# Patient Record
Sex: Female | Born: 1945 | Race: White | Hispanic: No | Marital: Married | Smoking: Never smoker
Health system: Southern US, Community
[De-identification: ages and names within clinical notes are randomized; demographics above are authoritative.]

## PROBLEM LIST (undated history)

## (undated) DIAGNOSIS — Z9289 Personal history of other medical treatment: Secondary | ICD-10-CM

## (undated) DIAGNOSIS — N19 Unspecified kidney failure: Secondary | ICD-10-CM

## (undated) DIAGNOSIS — Z87442 Personal history of urinary calculi: Secondary | ICD-10-CM

## (undated) DIAGNOSIS — I10 Essential (primary) hypertension: Secondary | ICD-10-CM

## (undated) DIAGNOSIS — F419 Anxiety disorder, unspecified: Secondary | ICD-10-CM

## (undated) DIAGNOSIS — R32 Unspecified urinary incontinence: Secondary | ICD-10-CM

## (undated) DIAGNOSIS — N289 Disorder of kidney and ureter, unspecified: Secondary | ICD-10-CM

## (undated) DIAGNOSIS — E039 Hypothyroidism, unspecified: Secondary | ICD-10-CM

## (undated) DIAGNOSIS — F32A Depression, unspecified: Secondary | ICD-10-CM

## (undated) DIAGNOSIS — G459 Transient cerebral ischemic attack, unspecified: Secondary | ICD-10-CM

## (undated) DIAGNOSIS — R413 Other amnesia: Secondary | ICD-10-CM

## (undated) DIAGNOSIS — J189 Pneumonia, unspecified organism: Secondary | ICD-10-CM

## (undated) DIAGNOSIS — M1990 Unspecified osteoarthritis, unspecified site: Secondary | ICD-10-CM

## (undated) DIAGNOSIS — E079 Disorder of thyroid, unspecified: Secondary | ICD-10-CM

## (undated) DIAGNOSIS — I639 Cerebral infarction, unspecified: Secondary | ICD-10-CM

## (undated) DIAGNOSIS — F329 Major depressive disorder, single episode, unspecified: Secondary | ICD-10-CM

## (undated) DIAGNOSIS — K219 Gastro-esophageal reflux disease without esophagitis: Secondary | ICD-10-CM

## (undated) DIAGNOSIS — J45909 Unspecified asthma, uncomplicated: Secondary | ICD-10-CM

## (undated) HISTORY — PX: CHOLECYSTECTOMY: SHX55

## (undated) HISTORY — PX: DILATION AND CURETTAGE OF UTERUS: SHX78

## (undated) HISTORY — PX: TONSILLECTOMY: SUR1361

## (undated) HISTORY — PX: KNEE SURGERY: SHX244

## (undated) HISTORY — PX: EYE SURGERY: SHX253

---

## 1998-04-24 ENCOUNTER — Other Ambulatory Visit: Admission: RE | Admit: 1998-04-24 | Discharge: 1998-04-24 | Payer: Self-pay | Admitting: Internal Medicine

## 1999-07-21 ENCOUNTER — Other Ambulatory Visit: Admission: RE | Admit: 1999-07-21 | Discharge: 1999-07-21 | Payer: Self-pay | Admitting: Internal Medicine

## 1999-09-03 ENCOUNTER — Encounter: Admission: RE | Admit: 1999-09-03 | Discharge: 1999-09-03 | Payer: Self-pay | Admitting: Internal Medicine

## 1999-10-28 ENCOUNTER — Emergency Department (HOSPITAL_COMMUNITY): Admission: EM | Admit: 1999-10-28 | Discharge: 1999-10-28 | Payer: Self-pay | Admitting: *Deleted

## 2000-08-22 ENCOUNTER — Other Ambulatory Visit: Admission: RE | Admit: 2000-08-22 | Discharge: 2000-08-22 | Payer: Self-pay | Admitting: Internal Medicine

## 2001-05-08 ENCOUNTER — Encounter: Admission: RE | Admit: 2001-05-08 | Discharge: 2001-05-08 | Payer: Self-pay | Admitting: Internal Medicine

## 2001-05-08 ENCOUNTER — Encounter: Payer: Self-pay | Admitting: Internal Medicine

## 2001-08-03 ENCOUNTER — Encounter: Payer: Self-pay | Admitting: Internal Medicine

## 2001-08-03 ENCOUNTER — Encounter: Admission: RE | Admit: 2001-08-03 | Discharge: 2001-08-03 | Payer: Self-pay | Admitting: Internal Medicine

## 2003-08-15 ENCOUNTER — Encounter: Admission: RE | Admit: 2003-08-15 | Discharge: 2003-08-15 | Payer: Self-pay | Admitting: Internal Medicine

## 2004-11-10 ENCOUNTER — Ambulatory Visit: Payer: Self-pay | Admitting: Internal Medicine

## 2005-06-24 ENCOUNTER — Encounter: Admission: RE | Admit: 2005-06-24 | Discharge: 2005-06-24 | Payer: Self-pay | Admitting: Internal Medicine

## 2005-11-25 ENCOUNTER — Ambulatory Visit: Payer: Self-pay | Admitting: Internal Medicine

## 2005-12-23 ENCOUNTER — Ambulatory Visit: Payer: Self-pay | Admitting: Gastroenterology

## 2006-12-29 ENCOUNTER — Encounter: Admission: RE | Admit: 2006-12-29 | Discharge: 2006-12-29 | Payer: Self-pay | Admitting: Internal Medicine

## 2007-12-21 ENCOUNTER — Encounter: Admission: RE | Admit: 2007-12-21 | Discharge: 2007-12-21 | Payer: Self-pay | Admitting: Internal Medicine

## 2008-05-09 ENCOUNTER — Encounter: Admission: RE | Admit: 2008-05-09 | Discharge: 2008-05-09 | Payer: Self-pay | Admitting: Internal Medicine

## 2008-08-15 ENCOUNTER — Encounter: Admission: RE | Admit: 2008-08-15 | Discharge: 2008-08-15 | Payer: Self-pay | Admitting: Orthopedic Surgery

## 2008-08-29 ENCOUNTER — Encounter: Admission: RE | Admit: 2008-08-29 | Discharge: 2008-08-29 | Payer: Self-pay | Admitting: Orthopedic Surgery

## 2008-10-15 ENCOUNTER — Encounter: Admission: RE | Admit: 2008-10-15 | Discharge: 2008-10-15 | Payer: Self-pay | Admitting: Orthopedic Surgery

## 2009-07-03 ENCOUNTER — Encounter: Admission: RE | Admit: 2009-07-03 | Discharge: 2009-07-03 | Payer: Self-pay | Admitting: Internal Medicine

## 2009-07-10 ENCOUNTER — Encounter: Admission: RE | Admit: 2009-07-10 | Discharge: 2009-07-10 | Payer: Self-pay | Admitting: Orthopedic Surgery

## 2010-06-04 ENCOUNTER — Encounter: Admission: RE | Admit: 2010-06-04 | Discharge: 2010-06-04 | Payer: Self-pay | Admitting: Orthopedic Surgery

## 2011-01-13 ENCOUNTER — Other Ambulatory Visit: Payer: Self-pay | Admitting: Internal Medicine

## 2011-01-13 DIAGNOSIS — Z1231 Encounter for screening mammogram for malignant neoplasm of breast: Secondary | ICD-10-CM

## 2011-01-26 ENCOUNTER — Ambulatory Visit
Admission: RE | Admit: 2011-01-26 | Discharge: 2011-01-26 | Disposition: A | Discharge status: Home / Self Care | Payer: Medicare Other | Source: Ambulatory Visit | Attending: Internal Medicine | Admitting: Internal Medicine

## 2011-01-26 DIAGNOSIS — Z1231 Encounter for screening mammogram for malignant neoplasm of breast: Secondary | ICD-10-CM

## 2011-04-18 ENCOUNTER — Other Ambulatory Visit: Payer: Self-pay | Admitting: Internal Medicine

## 2011-04-19 ENCOUNTER — Ambulatory Visit
Admission: RE | Admit: 2011-04-19 | Discharge: 2011-04-19 | Disposition: A | Discharge status: Home / Self Care | Payer: BLUE CROSS/BLUE SHIELD | Source: Ambulatory Visit | Attending: Internal Medicine | Admitting: Internal Medicine

## 2011-10-25 DIAGNOSIS — N184 Chronic kidney disease, stage 4 (severe): Secondary | ICD-10-CM | POA: Diagnosis not present

## 2011-10-25 DIAGNOSIS — M109 Gout, unspecified: Secondary | ICD-10-CM | POA: Diagnosis not present

## 2011-10-25 DIAGNOSIS — D649 Anemia, unspecified: Secondary | ICD-10-CM | POA: Diagnosis not present

## 2011-10-25 DIAGNOSIS — N2581 Secondary hyperparathyroidism of renal origin: Secondary | ICD-10-CM | POA: Diagnosis not present

## 2011-11-01 DIAGNOSIS — M109 Gout, unspecified: Secondary | ICD-10-CM | POA: Diagnosis not present

## 2011-11-01 DIAGNOSIS — N184 Chronic kidney disease, stage 4 (severe): Secondary | ICD-10-CM | POA: Diagnosis not present

## 2011-11-01 DIAGNOSIS — N2581 Secondary hyperparathyroidism of renal origin: Secondary | ICD-10-CM | POA: Diagnosis not present

## 2011-11-01 DIAGNOSIS — I129 Hypertensive chronic kidney disease with stage 1 through stage 4 chronic kidney disease, or unspecified chronic kidney disease: Secondary | ICD-10-CM | POA: Diagnosis not present

## 2011-11-23 DIAGNOSIS — M9420 Chondromalacia, unspecified site: Secondary | ICD-10-CM | POA: Diagnosis not present

## 2011-11-23 DIAGNOSIS — M5137 Other intervertebral disc degeneration, lumbosacral region: Secondary | ICD-10-CM | POA: Diagnosis not present

## 2011-12-01 DIAGNOSIS — H35379 Puckering of macula, unspecified eye: Secondary | ICD-10-CM | POA: Diagnosis not present

## 2011-12-01 DIAGNOSIS — H26499 Other secondary cataract, unspecified eye: Secondary | ICD-10-CM | POA: Diagnosis not present

## 2011-12-01 DIAGNOSIS — Z961 Presence of intraocular lens: Secondary | ICD-10-CM | POA: Diagnosis not present

## 2011-12-08 DIAGNOSIS — M1710 Unilateral primary osteoarthritis, unspecified knee: Secondary | ICD-10-CM | POA: Diagnosis not present

## 2011-12-15 DIAGNOSIS — M1710 Unilateral primary osteoarthritis, unspecified knee: Secondary | ICD-10-CM | POA: Diagnosis not present

## 2011-12-21 DIAGNOSIS — E039 Hypothyroidism, unspecified: Secondary | ICD-10-CM | POA: Diagnosis not present

## 2011-12-21 DIAGNOSIS — I10 Essential (primary) hypertension: Secondary | ICD-10-CM | POA: Diagnosis not present

## 2011-12-21 DIAGNOSIS — R82998 Other abnormal findings in urine: Secondary | ICD-10-CM | POA: Diagnosis not present

## 2011-12-22 DIAGNOSIS — M1710 Unilateral primary osteoarthritis, unspecified knee: Secondary | ICD-10-CM | POA: Diagnosis not present

## 2011-12-22 DIAGNOSIS — H35379 Puckering of macula, unspecified eye: Secondary | ICD-10-CM | POA: Diagnosis not present

## 2011-12-28 DIAGNOSIS — N184 Chronic kidney disease, stage 4 (severe): Secondary | ICD-10-CM | POA: Diagnosis not present

## 2011-12-28 DIAGNOSIS — E039 Hypothyroidism, unspecified: Secondary | ICD-10-CM | POA: Diagnosis not present

## 2011-12-28 DIAGNOSIS — I10 Essential (primary) hypertension: Secondary | ICD-10-CM | POA: Diagnosis not present

## 2011-12-28 DIAGNOSIS — Z0000 Encounter for general adult medical examination without abnormal findings: Secondary | ICD-10-CM | POA: Diagnosis not present

## 2012-01-17 DIAGNOSIS — N184 Chronic kidney disease, stage 4 (severe): Secondary | ICD-10-CM | POA: Diagnosis not present

## 2012-01-18 DIAGNOSIS — H26499 Other secondary cataract, unspecified eye: Secondary | ICD-10-CM | POA: Diagnosis not present

## 2012-01-19 DIAGNOSIS — M1710 Unilateral primary osteoarthritis, unspecified knee: Secondary | ICD-10-CM | POA: Diagnosis not present

## 2012-01-26 DIAGNOSIS — M1710 Unilateral primary osteoarthritis, unspecified knee: Secondary | ICD-10-CM | POA: Diagnosis not present

## 2012-02-23 DIAGNOSIS — E039 Hypothyroidism, unspecified: Secondary | ICD-10-CM | POA: Diagnosis not present

## 2012-02-23 DIAGNOSIS — I129 Hypertensive chronic kidney disease with stage 1 through stage 4 chronic kidney disease, or unspecified chronic kidney disease: Secondary | ICD-10-CM | POA: Diagnosis not present

## 2012-02-23 DIAGNOSIS — I10 Essential (primary) hypertension: Secondary | ICD-10-CM | POA: Diagnosis not present

## 2012-02-23 DIAGNOSIS — N2581 Secondary hyperparathyroidism of renal origin: Secondary | ICD-10-CM | POA: Diagnosis not present

## 2012-02-23 DIAGNOSIS — N184 Chronic kidney disease, stage 4 (severe): Secondary | ICD-10-CM | POA: Diagnosis not present

## 2012-03-05 ENCOUNTER — Other Ambulatory Visit: Payer: Self-pay | Admitting: Internal Medicine

## 2012-03-05 DIAGNOSIS — Z1231 Encounter for screening mammogram for malignant neoplasm of breast: Secondary | ICD-10-CM

## 2012-03-09 DIAGNOSIS — I10 Essential (primary) hypertension: Secondary | ICD-10-CM | POA: Diagnosis not present

## 2012-03-09 DIAGNOSIS — R87610 Atypical squamous cells of undetermined significance on cytologic smear of cervix (ASC-US): Secondary | ICD-10-CM | POA: Diagnosis not present

## 2012-03-09 DIAGNOSIS — E039 Hypothyroidism, unspecified: Secondary | ICD-10-CM | POA: Diagnosis not present

## 2012-03-09 DIAGNOSIS — Z0000 Encounter for general adult medical examination without abnormal findings: Secondary | ICD-10-CM | POA: Diagnosis not present

## 2012-03-09 DIAGNOSIS — N184 Chronic kidney disease, stage 4 (severe): Secondary | ICD-10-CM | POA: Diagnosis not present

## 2012-03-21 ENCOUNTER — Ambulatory Visit
Admission: RE | Admit: 2012-03-21 | Discharge: 2012-03-21 | Disposition: A | Discharge status: Home / Self Care | Payer: Medicare Other | Source: Ambulatory Visit | Attending: Internal Medicine | Admitting: Internal Medicine

## 2012-03-21 DIAGNOSIS — Z1231 Encounter for screening mammogram for malignant neoplasm of breast: Secondary | ICD-10-CM

## 2012-03-22 DIAGNOSIS — H43819 Vitreous degeneration, unspecified eye: Secondary | ICD-10-CM | POA: Diagnosis not present

## 2012-03-22 DIAGNOSIS — H35379 Puckering of macula, unspecified eye: Secondary | ICD-10-CM | POA: Diagnosis not present

## 2012-03-22 DIAGNOSIS — H35359 Cystoid macular degeneration, unspecified eye: Secondary | ICD-10-CM | POA: Diagnosis not present

## 2012-04-02 DIAGNOSIS — M25569 Pain in unspecified knee: Secondary | ICD-10-CM | POA: Diagnosis not present

## 2012-04-02 DIAGNOSIS — M1710 Unilateral primary osteoarthritis, unspecified knee: Secondary | ICD-10-CM | POA: Diagnosis not present

## 2012-04-24 DIAGNOSIS — I491 Atrial premature depolarization: Secondary | ICD-10-CM | POA: Diagnosis not present

## 2012-04-24 DIAGNOSIS — I369 Nonrheumatic tricuspid valve disorder, unspecified: Secondary | ICD-10-CM | POA: Diagnosis not present

## 2012-04-24 DIAGNOSIS — Z01818 Encounter for other preprocedural examination: Secondary | ICD-10-CM | POA: Diagnosis not present

## 2012-04-26 DIAGNOSIS — K921 Melena: Secondary | ICD-10-CM | POA: Diagnosis not present

## 2012-04-26 DIAGNOSIS — D126 Benign neoplasm of colon, unspecified: Secondary | ICD-10-CM | POA: Diagnosis not present

## 2012-05-14 DIAGNOSIS — M9420 Chondromalacia, unspecified site: Secondary | ICD-10-CM | POA: Diagnosis not present

## 2012-05-14 DIAGNOSIS — M1710 Unilateral primary osteoarthritis, unspecified knee: Secondary | ICD-10-CM | POA: Diagnosis not present

## 2012-05-21 DIAGNOSIS — M1710 Unilateral primary osteoarthritis, unspecified knee: Secondary | ICD-10-CM | POA: Diagnosis not present

## 2012-05-21 DIAGNOSIS — M238X9 Other internal derangements of unspecified knee: Secondary | ICD-10-CM | POA: Diagnosis not present

## 2012-05-25 DIAGNOSIS — M1710 Unilateral primary osteoarthritis, unspecified knee: Secondary | ICD-10-CM | POA: Diagnosis not present

## 2012-05-29 DIAGNOSIS — M1710 Unilateral primary osteoarthritis, unspecified knee: Secondary | ICD-10-CM | POA: Diagnosis not present

## 2012-06-01 DIAGNOSIS — M2240 Chondromalacia patellae, unspecified knee: Secondary | ICD-10-CM | POA: Diagnosis not present

## 2012-06-01 DIAGNOSIS — X58XXXA Exposure to other specified factors, initial encounter: Secondary | ICD-10-CM | POA: Diagnosis not present

## 2012-06-01 DIAGNOSIS — Y929 Unspecified place or not applicable: Secondary | ICD-10-CM | POA: Diagnosis not present

## 2012-06-01 DIAGNOSIS — M9380 Other specified osteochondropathies of unspecified site: Secondary | ICD-10-CM | POA: Diagnosis not present

## 2012-06-01 DIAGNOSIS — S83289A Other tear of lateral meniscus, current injury, unspecified knee, initial encounter: Secondary | ICD-10-CM | POA: Diagnosis not present

## 2012-06-01 DIAGNOSIS — M1710 Unilateral primary osteoarthritis, unspecified knee: Secondary | ICD-10-CM | POA: Diagnosis not present

## 2012-06-13 DIAGNOSIS — E039 Hypothyroidism, unspecified: Secondary | ICD-10-CM | POA: Diagnosis not present

## 2012-06-13 DIAGNOSIS — N184 Chronic kidney disease, stage 4 (severe): Secondary | ICD-10-CM | POA: Diagnosis not present

## 2012-06-13 DIAGNOSIS — I10 Essential (primary) hypertension: Secondary | ICD-10-CM | POA: Diagnosis not present

## 2012-06-21 DIAGNOSIS — I129 Hypertensive chronic kidney disease with stage 1 through stage 4 chronic kidney disease, or unspecified chronic kidney disease: Secondary | ICD-10-CM | POA: Diagnosis not present

## 2012-06-21 DIAGNOSIS — N184 Chronic kidney disease, stage 4 (severe): Secondary | ICD-10-CM | POA: Diagnosis not present

## 2012-06-21 DIAGNOSIS — H35379 Puckering of macula, unspecified eye: Secondary | ICD-10-CM | POA: Diagnosis not present

## 2012-06-21 DIAGNOSIS — M109 Gout, unspecified: Secondary | ICD-10-CM | POA: Diagnosis not present

## 2012-06-21 DIAGNOSIS — D649 Anemia, unspecified: Secondary | ICD-10-CM | POA: Diagnosis not present

## 2012-06-21 DIAGNOSIS — H33109 Unspecified retinoschisis, unspecified eye: Secondary | ICD-10-CM | POA: Diagnosis not present

## 2012-06-21 DIAGNOSIS — H43819 Vitreous degeneration, unspecified eye: Secondary | ICD-10-CM | POA: Diagnosis not present

## 2012-06-26 DIAGNOSIS — M1710 Unilateral primary osteoarthritis, unspecified knee: Secondary | ICD-10-CM | POA: Diagnosis not present

## 2012-07-04 DIAGNOSIS — M1710 Unilateral primary osteoarthritis, unspecified knee: Secondary | ICD-10-CM | POA: Diagnosis not present

## 2012-07-06 DIAGNOSIS — E039 Hypothyroidism, unspecified: Secondary | ICD-10-CM | POA: Diagnosis not present

## 2012-07-06 DIAGNOSIS — F341 Dysthymic disorder: Secondary | ICD-10-CM | POA: Diagnosis not present

## 2012-07-06 DIAGNOSIS — I10 Essential (primary) hypertension: Secondary | ICD-10-CM | POA: Diagnosis not present

## 2012-07-06 DIAGNOSIS — Z23 Encounter for immunization: Secondary | ICD-10-CM | POA: Diagnosis not present

## 2012-07-11 DIAGNOSIS — M1710 Unilateral primary osteoarthritis, unspecified knee: Secondary | ICD-10-CM | POA: Diagnosis not present

## 2012-07-25 DIAGNOSIS — M1710 Unilateral primary osteoarthritis, unspecified knee: Secondary | ICD-10-CM | POA: Diagnosis not present

## 2012-07-25 DIAGNOSIS — M533 Sacrococcygeal disorders, not elsewhere classified: Secondary | ICD-10-CM | POA: Diagnosis not present

## 2012-08-01 DIAGNOSIS — M533 Sacrococcygeal disorders, not elsewhere classified: Secondary | ICD-10-CM | POA: Diagnosis not present

## 2012-08-01 DIAGNOSIS — M1710 Unilateral primary osteoarthritis, unspecified knee: Secondary | ICD-10-CM | POA: Diagnosis not present

## 2012-08-09 DIAGNOSIS — M1710 Unilateral primary osteoarthritis, unspecified knee: Secondary | ICD-10-CM | POA: Diagnosis not present

## 2012-08-20 DIAGNOSIS — M1710 Unilateral primary osteoarthritis, unspecified knee: Secondary | ICD-10-CM | POA: Diagnosis not present

## 2012-08-27 DIAGNOSIS — M1710 Unilateral primary osteoarthritis, unspecified knee: Secondary | ICD-10-CM | POA: Diagnosis not present

## 2012-10-25 DIAGNOSIS — H35379 Puckering of macula, unspecified eye: Secondary | ICD-10-CM | POA: Diagnosis not present

## 2012-10-25 DIAGNOSIS — H33109 Unspecified retinoschisis, unspecified eye: Secondary | ICD-10-CM | POA: Diagnosis not present

## 2012-11-21 ENCOUNTER — Other Ambulatory Visit (HOSPITAL_COMMUNITY): Payer: Self-pay | Admitting: Internal Medicine

## 2012-11-21 DIAGNOSIS — E039 Hypothyroidism, unspecified: Secondary | ICD-10-CM | POA: Diagnosis not present

## 2012-11-21 DIAGNOSIS — R7402 Elevation of levels of lactic acid dehydrogenase (LDH): Secondary | ICD-10-CM | POA: Diagnosis not present

## 2012-11-21 DIAGNOSIS — R1310 Dysphagia, unspecified: Secondary | ICD-10-CM

## 2012-11-21 DIAGNOSIS — G4700 Insomnia, unspecified: Secondary | ICD-10-CM | POA: Diagnosis not present

## 2012-11-21 DIAGNOSIS — N184 Chronic kidney disease, stage 4 (severe): Secondary | ICD-10-CM | POA: Diagnosis not present

## 2012-11-21 DIAGNOSIS — I10 Essential (primary) hypertension: Secondary | ICD-10-CM | POA: Diagnosis not present

## 2012-11-21 DIAGNOSIS — F341 Dysthymic disorder: Secondary | ICD-10-CM | POA: Diagnosis not present

## 2012-11-21 DIAGNOSIS — D649 Anemia, unspecified: Secondary | ICD-10-CM | POA: Diagnosis not present

## 2012-11-27 ENCOUNTER — Ambulatory Visit (HOSPITAL_COMMUNITY)
Admission: RE | Admit: 2012-11-27 | Discharge: 2012-11-27 | Disposition: A | Discharge status: Home / Self Care | Payer: Medicare Other | Source: Ambulatory Visit | Attending: Internal Medicine | Admitting: Internal Medicine

## 2012-11-27 DIAGNOSIS — R1319 Other dysphagia: Secondary | ICD-10-CM | POA: Insufficient documentation

## 2012-11-27 DIAGNOSIS — R1310 Dysphagia, unspecified: Secondary | ICD-10-CM | POA: Diagnosis not present

## 2012-11-27 NOTE — Procedures (Signed)
Objective Swallowing Evaluation: Modified Barium Swallowing Study  Patient Details  Name: Carolyn Robertson MRN: [id number] Date of Birth: [birth year]  Today's Date: 11/27/2012 Time:1301  - 1400    Past Medical History: No past medical history on file. Past Surgical History: No past surgical history on file. HPI:  67 yo female referred by Dr Reynaldo Minium for MBS due to concerns pt may be aspirating per his referral form.  PMH + for HTN, CKD, ACD, OA, migraines, allergies, asthma.  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  Pt had an UGI study in 2002 and has been taking Nexium since per her report.  She complains of persistent sore throat - pills sticking in the throat and problems swallowing meats or dense items.  She denies pulmonary infection but does admit to weight loss over the last year, which she attributes to stress.  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   Previous Swallow Assessment: SMALL HIATUS HERNIA WITH SLIGHT DYSMOTILITY PROBABLY DUE TO REFLUX ESOPHAGITIS WITH MARKED SPONTANEOUS GASTROESOPHAGEAL REFLUX 04/2001    Assessment / Plan / Recommendation Clinical Impression  Dysphagia Diagnosis: Within Functional Limits;Suspected primary esophageal dysphagia Clinical impression: Pt presents with a functional oropharyngeal swallow and suspected primary esophageal/GERD deficits.  No aspiration or deep laryngeal penetration observed.  Swallow was timely with only mild to trace vallecular residuals noted with liquids.    Radiologist was not present for testing during esophageal scan.  Pt appeared with tertiary contractions and retrograde propulsion of liquids to thoracic esophagus WITHOUT sensation.  Pudding and cracker appeared be delayed clearing distally with pt referrant sensation to pharynx - likely due to vagus nerve.  Barium tablet taken with thin readily cleared into stomach without  delay.  Pt's symptoms appear consistent with LPR- score on Reflux Symptom Index was 17/45.  Given pt has previously been diagnosed with reflux, slp provided pt with verbal and written tips to mitigate her dysphagia symptoms.  SLP used teach back to assure pt comprehended info provided.      Treatment Recommendation       Diet Recommendation Regular;Thin liquid (extra gravy and sauce)   Liquid Administration via: Cup;Straw Medication Administration: Whole meds with liquid Supervision: Patient able to self feed Compensations: Slow rate;Small sips/bites;Follow solids with liquid Postural Changes and/or Swallow Maneuvers: Out of bed for meals    Other  Recommendations Recommended Consults: Consider ENT evaluation;Consider esophageal assessment (rec ENT for sore throat given h/o radiation & thyriod issues) Oral Care Recommendations: Oral care BID   Follow Up Recommendations  None           SLP Swallow Goals     General Date of Onset: 11/27/12 HPI: 67 yo female referred by Dr Reynaldo Minium for MBS due to concerns pt may be aspirating per his referral form.  PMH + for HTN, CKD, ACD, OA, migraines, allergies, asthma.  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  Pt had an UGI study in 2002 and has been taking Nexium since per her report.  She complains of persistent sore throat - pills sticking in the throat and problems swallowing meats or dense items.  She denies pulmonary infection but does admit to weight loss over the last year, which she attributes to stress.  Pt reports having undergone radiation at age 51 for ketones and subsequently having thyroid problems.   Type of Study: Modified Barium Swallowing  Study Previous Swallow Assessment: SMALL HIATUS HERNIA WITH SLIGHT DYSMOTILITY PROBABLY DUE TO REFLUX ESOPHAGITIS WITH MARKED SPONTANEOUS GASTROESOPHAGEAL REFLUX 04/2001 Diet Prior to this Study: Regular;Thin liquids Temperature Spikes Noted:  No Respiratory Status: Room air History of Recent Intubation: No Behavior/Cognition: Alert;Cooperative;Pleasant mood Oral Cavity - Dentition: Adequate natural dentition Oral Motor / Sensory Function: Within functional limits Self-Feeding Abilities: Able to feed self Patient Positioning: Upright in chair Baseline Vocal Quality: Clear Volitional Cough: Strong Volitional Swallow: Able to elicit Anatomy: Within functional limits    Reason for Referral   concern for aspiration  Oral Phase Oral Preparation/Oral Phase Oral Phase: WFL   Pharyngeal Phase Pharyngeal Phase Pharyngeal Phase: Within functional limits Pharyngeal - Thin Pharyngeal - Thin Cup: Penetration/Aspiration during swallow Penetration/Aspiration details (thin cup): Material enters airway, remains ABOVE vocal cords then ejected out [facility]) Pharyngeal - Thin Straw: Within functional limits Pharyngeal - Solids Pharyngeal - Puree: Within functional limits;Pharyngeal residue - valleculae Pharyngeal - Regular: Within functional limits;Pharyngeal residue - valleculae Pharyngeal - Pill: Within functional limits Pharyngeal Phase - Comment Pharyngeal Comment: trace stasis noted with liquids - pt sensed stasis at pharynx when it appeared in distal esophagus  Cervical Esophageal Phase    GO    Cervical Esophageal Phase Cervical Esophageal Phase: WFL (appearance of prominent cricopharyngeus intermittently seen)    Functional Assessment Tool Used: mbs, clinical judgement Functional Limitations: Swallowing Swallow Current Status [id number]): At least 1 percent but less than 20 percent impaired, limited or restricted Swallow Goal Status [phone]): At least 1 percent but less than 20 percent impaired, limited or restricted Swallow Discharge Status [phone]): At least 1 percent but less than 20 percent impaired, limited or restricted    Luanna Salk, [place] [facility] SLP [phone]

## 2012-12-05 DIAGNOSIS — D649 Anemia, unspecified: Secondary | ICD-10-CM | POA: Diagnosis not present

## 2012-12-12 DIAGNOSIS — D649 Anemia, unspecified: Secondary | ICD-10-CM | POA: Diagnosis not present

## 2012-12-12 DIAGNOSIS — N184 Chronic kidney disease, stage 4 (severe): Secondary | ICD-10-CM | POA: Diagnosis not present

## 2012-12-12 DIAGNOSIS — M109 Gout, unspecified: Secondary | ICD-10-CM | POA: Diagnosis not present

## 2012-12-17 DIAGNOSIS — I129 Hypertensive chronic kidney disease with stage 1 through stage 4 chronic kidney disease, or unspecified chronic kidney disease: Secondary | ICD-10-CM | POA: Diagnosis not present

## 2012-12-17 DIAGNOSIS — E211 Secondary hyperparathyroidism, not elsewhere classified: Secondary | ICD-10-CM | POA: Diagnosis not present

## 2012-12-17 DIAGNOSIS — N184 Chronic kidney disease, stage 4 (severe): Secondary | ICD-10-CM | POA: Diagnosis not present

## 2012-12-17 DIAGNOSIS — D649 Anemia, unspecified: Secondary | ICD-10-CM | POA: Diagnosis not present

## 2012-12-20 DIAGNOSIS — D649 Anemia, unspecified: Secondary | ICD-10-CM | POA: Diagnosis not present

## 2013-01-07 DIAGNOSIS — I10 Essential (primary) hypertension: Secondary | ICD-10-CM | POA: Diagnosis not present

## 2013-01-07 DIAGNOSIS — K219 Gastro-esophageal reflux disease without esophagitis: Secondary | ICD-10-CM | POA: Diagnosis not present

## 2013-01-07 DIAGNOSIS — F329 Major depressive disorder, single episode, unspecified: Secondary | ICD-10-CM | POA: Diagnosis not present

## 2013-01-07 DIAGNOSIS — N184 Chronic kidney disease, stage 4 (severe): Secondary | ICD-10-CM | POA: Diagnosis not present

## 2013-01-07 DIAGNOSIS — Z0000 Encounter for general adult medical examination without abnormal findings: Secondary | ICD-10-CM | POA: Diagnosis not present

## 2013-01-24 DIAGNOSIS — H35379 Puckering of macula, unspecified eye: Secondary | ICD-10-CM | POA: Diagnosis not present

## 2013-03-07 DIAGNOSIS — H35379 Puckering of macula, unspecified eye: Secondary | ICD-10-CM | POA: Diagnosis not present

## 2013-03-07 DIAGNOSIS — H43819 Vitreous degeneration, unspecified eye: Secondary | ICD-10-CM | POA: Diagnosis not present

## 2013-03-07 DIAGNOSIS — H35359 Cystoid macular degeneration, unspecified eye: Secondary | ICD-10-CM | POA: Diagnosis not present

## 2013-04-08 DIAGNOSIS — M1710 Unilateral primary osteoarthritis, unspecified knee: Secondary | ICD-10-CM | POA: Diagnosis not present

## 2013-05-30 DIAGNOSIS — M1710 Unilateral primary osteoarthritis, unspecified knee: Secondary | ICD-10-CM | POA: Diagnosis not present

## 2013-05-30 DIAGNOSIS — M25559 Pain in unspecified hip: Secondary | ICD-10-CM | POA: Diagnosis not present

## 2013-06-05 DIAGNOSIS — M1710 Unilateral primary osteoarthritis, unspecified knee: Secondary | ICD-10-CM | POA: Diagnosis not present

## 2013-06-06 DIAGNOSIS — H33109 Unspecified retinoschisis, unspecified eye: Secondary | ICD-10-CM | POA: Diagnosis not present

## 2013-06-06 DIAGNOSIS — H35379 Puckering of macula, unspecified eye: Secondary | ICD-10-CM | POA: Diagnosis not present

## 2013-06-06 DIAGNOSIS — H35359 Cystoid macular degeneration, unspecified eye: Secondary | ICD-10-CM | POA: Diagnosis not present

## 2013-06-12 DIAGNOSIS — M1710 Unilateral primary osteoarthritis, unspecified knee: Secondary | ICD-10-CM | POA: Diagnosis not present

## 2013-06-18 DIAGNOSIS — I129 Hypertensive chronic kidney disease with stage 1 through stage 4 chronic kidney disease, or unspecified chronic kidney disease: Secondary | ICD-10-CM | POA: Diagnosis not present

## 2013-06-18 DIAGNOSIS — R5381 Other malaise: Secondary | ICD-10-CM | POA: Diagnosis not present

## 2013-06-18 DIAGNOSIS — N184 Chronic kidney disease, stage 4 (severe): Secondary | ICD-10-CM | POA: Diagnosis not present

## 2013-06-18 DIAGNOSIS — D649 Anemia, unspecified: Secondary | ICD-10-CM | POA: Diagnosis not present

## 2013-06-18 DIAGNOSIS — N2581 Secondary hyperparathyroidism of renal origin: Secondary | ICD-10-CM | POA: Diagnosis not present

## 2013-06-21 DIAGNOSIS — M47817 Spondylosis without myelopathy or radiculopathy, lumbosacral region: Secondary | ICD-10-CM | POA: Diagnosis not present

## 2013-06-25 DIAGNOSIS — M25559 Pain in unspecified hip: Secondary | ICD-10-CM | POA: Diagnosis not present

## 2013-06-25 DIAGNOSIS — M1710 Unilateral primary osteoarthritis, unspecified knee: Secondary | ICD-10-CM | POA: Diagnosis not present

## 2013-07-11 DIAGNOSIS — M1710 Unilateral primary osteoarthritis, unspecified knee: Secondary | ICD-10-CM | POA: Diagnosis not present

## 2013-07-17 DIAGNOSIS — M5137 Other intervertebral disc degeneration, lumbosacral region: Secondary | ICD-10-CM | POA: Diagnosis not present

## 2013-07-17 DIAGNOSIS — M999 Biomechanical lesion, unspecified: Secondary | ICD-10-CM | POA: Diagnosis not present

## 2013-07-19 DIAGNOSIS — Z23 Encounter for immunization: Secondary | ICD-10-CM | POA: Diagnosis not present

## 2013-07-22 ENCOUNTER — Emergency Department (HOSPITAL_COMMUNITY)
Admission: EM | Admit: 2013-07-22 | Discharge: 2013-07-22 | Disposition: A | Discharge status: Home / Self Care | Payer: Medicare Other | Attending: Emergency Medicine | Admitting: Emergency Medicine

## 2013-07-22 ENCOUNTER — Emergency Department (HOSPITAL_COMMUNITY): Payer: Medicare Other

## 2013-07-22 DIAGNOSIS — N189 Chronic kidney disease, unspecified: Secondary | ICD-10-CM | POA: Diagnosis not present

## 2013-07-22 DIAGNOSIS — Z880 Allergy status to penicillin: Secondary | ICD-10-CM | POA: Insufficient documentation

## 2013-07-22 DIAGNOSIS — Y92410 Unspecified street and highway as the place of occurrence of the external cause: Secondary | ICD-10-CM | POA: Insufficient documentation

## 2013-07-22 DIAGNOSIS — IMO0002 Reserved for concepts with insufficient information to code with codable children: Secondary | ICD-10-CM | POA: Insufficient documentation

## 2013-07-22 DIAGNOSIS — Z87448 Personal history of other diseases of urinary system: Secondary | ICD-10-CM | POA: Diagnosis not present

## 2013-07-22 DIAGNOSIS — R42 Dizziness and giddiness: Secondary | ICD-10-CM | POA: Insufficient documentation

## 2013-07-22 DIAGNOSIS — Z79899 Other long term (current) drug therapy: Secondary | ICD-10-CM | POA: Insufficient documentation

## 2013-07-22 DIAGNOSIS — R55 Syncope and collapse: Secondary | ICD-10-CM | POA: Diagnosis not present

## 2013-07-22 DIAGNOSIS — Y9389 Activity, other specified: Secondary | ICD-10-CM | POA: Insufficient documentation

## 2013-07-22 DIAGNOSIS — R51 Headache: Secondary | ICD-10-CM | POA: Diagnosis not present

## 2013-07-22 DIAGNOSIS — T66XXXA Radiation sickness, unspecified, initial encounter: Secondary | ICD-10-CM | POA: Diagnosis not present

## 2013-07-22 DIAGNOSIS — S0990XA Unspecified injury of head, initial encounter: Secondary | ICD-10-CM | POA: Diagnosis not present

## 2013-07-22 LAB — BASIC METABOLIC PANEL
BUN: 39 mg/dL — ABNORMAL HIGH (ref 6–23)
CO2: 29 mEq/L (ref 19–32)
Chloride: 101 mEq/L (ref 96–112)
Creatinine, Ser: 2.08 mg/dL — ABNORMAL HIGH (ref 0.50–1.10)
GFR calc Af Amer: 27 mL/min — ABNORMAL LOW (ref >90)
Sodium: 139 mEq/L (ref 135–145)

## 2013-07-22 LAB — URINALYSIS, ROUTINE W REFLEX MICROSCOPIC
Leukocytes, UA: NEGATIVE
Protein, ur: NEGATIVE mg/dL
Specific Gravity, Urine: 1.015 (ref 1.005–1.030)
Urobilinogen, UA: 0.2 mg/dL (ref 0.0–1.0)
pH: 6.5 (ref 5.0–8.0)

## 2013-07-22 LAB — TROPONIN I: Troponin I: <0.3 ng/mL (ref <0.30)

## 2013-07-22 LAB — CBC WITH DIFFERENTIAL/PLATELET
Basophils Absolute: 0 10*3/uL (ref 0.0–0.1)
Eosinophils Relative: 1 % (ref 0–5)
Hemoglobin: 13.1 g/dL (ref 12.0–15.0)
MCHC: 32 g/dL (ref 30.0–36.0)
Monocytes Absolute: 0.6 10*3/uL (ref 0.1–1.0)
Neutrophils Relative %: 75 % (ref 43–77)
Platelets: 323 10*3/uL (ref 150–400)
WBC: 9.1 10*3/uL (ref 4.0–10.5)

## 2013-07-22 LAB — URINE MICROSCOPIC-ADD ON

## 2013-07-22 LAB — GLUCOSE, CAPILLARY: Glucose-Capillary: 106 mg/dL — ABNORMAL HIGH (ref 70–99)

## 2013-07-22 MED ORDER — SODIUM CHLORIDE 0.9 % IV BOLUS (SEPSIS)
500.0000 mL | Freq: Once | INTRAVENOUS | Status: AC
  Administered 2013-07-22: 500 mL via INTRAVENOUS

## 2013-07-22 NOTE — ED Notes (Signed)
MD at bedside. 
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
## 2013-07-22 NOTE — ED Notes (Signed)
Bed: [id number] Expected date:  Expected time:  Means of arrival:  Comments: Hall A for EKG
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
## 2013-07-24 ENCOUNTER — Other Ambulatory Visit: Payer: Self-pay | Admitting: Internal Medicine

## 2013-07-24 DIAGNOSIS — I10 Essential (primary) hypertension: Secondary | ICD-10-CM | POA: Diagnosis not present

## 2013-07-24 DIAGNOSIS — R55 Syncope and collapse: Secondary | ICD-10-CM

## 2013-07-24 DIAGNOSIS — Z6827 Body mass index (BMI) 27.0-27.9, adult: Secondary | ICD-10-CM | POA: Diagnosis not present

## 2013-07-24 DIAGNOSIS — N184 Chronic kidney disease, stage 4 (severe): Secondary | ICD-10-CM | POA: Diagnosis not present

## 2013-07-24 DIAGNOSIS — R404 Transient alteration of awareness: Secondary | ICD-10-CM | POA: Diagnosis not present

## 2013-07-27 ENCOUNTER — Ambulatory Visit
Admission: RE | Admit: 2013-07-27 | Discharge: 2013-07-27 | Disposition: A | Discharge status: Home / Self Care | Payer: Medicare Other | Source: Ambulatory Visit | Attending: Internal Medicine | Admitting: Internal Medicine

## 2013-07-27 DIAGNOSIS — R404 Transient alteration of awareness: Secondary | ICD-10-CM

## 2013-07-27 DIAGNOSIS — R930 Abnormal findings on diagnostic imaging of skull and head, not elsewhere classified: Secondary | ICD-10-CM | POA: Diagnosis not present

## 2013-07-27 DIAGNOSIS — R55 Syncope and collapse: Secondary | ICD-10-CM

## 2013-07-29 DIAGNOSIS — R55 Syncope and collapse: Secondary | ICD-10-CM | POA: Diagnosis not present

## 2013-08-19 DIAGNOSIS — R55 Syncope and collapse: Secondary | ICD-10-CM | POA: Diagnosis not present

## 2013-08-28 DIAGNOSIS — R55 Syncope and collapse: Secondary | ICD-10-CM | POA: Diagnosis not present

## 2013-08-28 DIAGNOSIS — I10 Essential (primary) hypertension: Secondary | ICD-10-CM | POA: Diagnosis not present

## 2013-09-09 DIAGNOSIS — K219 Gastro-esophageal reflux disease without esophagitis: Secondary | ICD-10-CM | POA: Diagnosis not present

## 2013-09-09 DIAGNOSIS — R509 Fever, unspecified: Secondary | ICD-10-CM | POA: Diagnosis not present

## 2013-09-09 DIAGNOSIS — I10 Essential (primary) hypertension: Secondary | ICD-10-CM | POA: Diagnosis not present

## 2013-09-09 DIAGNOSIS — Z6827 Body mass index (BMI) 27.0-27.9, adult: Secondary | ICD-10-CM | POA: Diagnosis not present

## 2013-09-09 DIAGNOSIS — N184 Chronic kidney disease, stage 4 (severe): Secondary | ICD-10-CM | POA: Diagnosis not present

## 2013-09-09 DIAGNOSIS — E039 Hypothyroidism, unspecified: Secondary | ICD-10-CM | POA: Diagnosis not present

## 2013-09-09 DIAGNOSIS — J309 Allergic rhinitis, unspecified: Secondary | ICD-10-CM | POA: Diagnosis not present

## 2013-10-03 DIAGNOSIS — H35359 Cystoid macular degeneration, unspecified eye: Secondary | ICD-10-CM | POA: Diagnosis not present

## 2013-10-03 DIAGNOSIS — H35379 Puckering of macula, unspecified eye: Secondary | ICD-10-CM | POA: Diagnosis not present

## 2013-10-03 DIAGNOSIS — H33109 Unspecified retinoschisis, unspecified eye: Secondary | ICD-10-CM | POA: Diagnosis not present

## 2013-10-03 DIAGNOSIS — H43819 Vitreous degeneration, unspecified eye: Secondary | ICD-10-CM | POA: Diagnosis not present

## 2013-12-18 DIAGNOSIS — N2581 Secondary hyperparathyroidism of renal origin: Secondary | ICD-10-CM | POA: Diagnosis not present

## 2013-12-18 DIAGNOSIS — N184 Chronic kidney disease, stage 4 (severe): Secondary | ICD-10-CM | POA: Diagnosis not present

## 2013-12-25 DIAGNOSIS — N2581 Secondary hyperparathyroidism of renal origin: Secondary | ICD-10-CM | POA: Diagnosis not present

## 2013-12-25 DIAGNOSIS — I129 Hypertensive chronic kidney disease with stage 1 through stage 4 chronic kidney disease, or unspecified chronic kidney disease: Secondary | ICD-10-CM | POA: Diagnosis not present

## 2013-12-25 DIAGNOSIS — N184 Chronic kidney disease, stage 4 (severe): Secondary | ICD-10-CM | POA: Diagnosis not present

## 2013-12-25 DIAGNOSIS — R609 Edema, unspecified: Secondary | ICD-10-CM | POA: Diagnosis not present

## 2013-12-30 DIAGNOSIS — H35359 Cystoid macular degeneration, unspecified eye: Secondary | ICD-10-CM | POA: Diagnosis not present

## 2013-12-30 DIAGNOSIS — H35379 Puckering of macula, unspecified eye: Secondary | ICD-10-CM | POA: Diagnosis not present

## 2014-01-09 DIAGNOSIS — E039 Hypothyroidism, unspecified: Secondary | ICD-10-CM | POA: Diagnosis not present

## 2014-01-09 DIAGNOSIS — J309 Allergic rhinitis, unspecified: Secondary | ICD-10-CM | POA: Diagnosis not present

## 2014-01-09 DIAGNOSIS — Z0000 Encounter for general adult medical examination without abnormal findings: Secondary | ICD-10-CM | POA: Diagnosis not present

## 2014-01-09 DIAGNOSIS — I10 Essential (primary) hypertension: Secondary | ICD-10-CM | POA: Diagnosis not present

## 2014-01-09 DIAGNOSIS — K219 Gastro-esophageal reflux disease without esophagitis: Secondary | ICD-10-CM | POA: Diagnosis not present

## 2014-01-09 DIAGNOSIS — F329 Major depressive disorder, single episode, unspecified: Secondary | ICD-10-CM | POA: Diagnosis not present

## 2014-01-09 DIAGNOSIS — N184 Chronic kidney disease, stage 4 (severe): Secondary | ICD-10-CM | POA: Diagnosis not present

## 2014-01-09 DIAGNOSIS — M1990 Unspecified osteoarthritis, unspecified site: Secondary | ICD-10-CM | POA: Diagnosis not present

## 2014-02-05 DIAGNOSIS — M545 Low back pain, unspecified: Secondary | ICD-10-CM | POA: Diagnosis not present

## 2014-03-27 DIAGNOSIS — M25569 Pain in unspecified knee: Secondary | ICD-10-CM | POA: Diagnosis not present

## 2014-06-11 DIAGNOSIS — M5430 Sciatica, unspecified side: Secondary | ICD-10-CM | POA: Diagnosis not present

## 2014-06-11 DIAGNOSIS — M1710 Unilateral primary osteoarthritis, unspecified knee: Secondary | ICD-10-CM | POA: Diagnosis not present

## 2014-06-18 DIAGNOSIS — M47817 Spondylosis without myelopathy or radiculopathy, lumbosacral region: Secondary | ICD-10-CM | POA: Diagnosis not present

## 2014-06-19 DIAGNOSIS — I129 Hypertensive chronic kidney disease with stage 1 through stage 4 chronic kidney disease, or unspecified chronic kidney disease: Secondary | ICD-10-CM | POA: Diagnosis not present

## 2014-06-19 DIAGNOSIS — E039 Hypothyroidism, unspecified: Secondary | ICD-10-CM | POA: Diagnosis not present

## 2014-06-19 DIAGNOSIS — N2581 Secondary hyperparathyroidism of renal origin: Secondary | ICD-10-CM | POA: Diagnosis not present

## 2014-06-19 DIAGNOSIS — Z23 Encounter for immunization: Secondary | ICD-10-CM | POA: Diagnosis not present

## 2014-06-19 DIAGNOSIS — N184 Chronic kidney disease, stage 4 (severe): Secondary | ICD-10-CM | POA: Diagnosis not present

## 2014-06-19 DIAGNOSIS — M109 Gout, unspecified: Secondary | ICD-10-CM | POA: Diagnosis not present

## 2014-06-25 ENCOUNTER — Other Ambulatory Visit: Payer: Self-pay | Admitting: Orthopedic Surgery

## 2014-06-25 DIAGNOSIS — M545 Low back pain: Secondary | ICD-10-CM

## 2014-06-25 DIAGNOSIS — M1710 Unilateral primary osteoarthritis, unspecified knee: Secondary | ICD-10-CM | POA: Diagnosis not present

## 2014-07-03 DIAGNOSIS — M1710 Unilateral primary osteoarthritis, unspecified knee: Secondary | ICD-10-CM | POA: Diagnosis not present

## 2014-07-07 DIAGNOSIS — H35379 Puckering of macula, unspecified eye: Secondary | ICD-10-CM | POA: Diagnosis not present

## 2014-07-07 DIAGNOSIS — H43819 Vitreous degeneration, unspecified eye: Secondary | ICD-10-CM | POA: Diagnosis not present

## 2014-07-07 DIAGNOSIS — H35359 Cystoid macular degeneration, unspecified eye: Secondary | ICD-10-CM | POA: Diagnosis not present

## 2014-07-08 ENCOUNTER — Ambulatory Visit
Admission: RE | Admit: 2014-07-08 | Discharge: 2014-07-08 | Disposition: A | Discharge status: Home / Self Care | Payer: Medicare Other | Source: Ambulatory Visit | Attending: Orthopedic Surgery | Admitting: Orthopedic Surgery

## 2014-07-08 DIAGNOSIS — M545 Low back pain: Secondary | ICD-10-CM

## 2014-07-08 DIAGNOSIS — M47817 Spondylosis without myelopathy or radiculopathy, lumbosacral region: Secondary | ICD-10-CM | POA: Diagnosis not present

## 2014-07-08 DIAGNOSIS — IMO0002 Reserved for concepts with insufficient information to code with codable children: Secondary | ICD-10-CM | POA: Diagnosis not present

## 2014-07-08 MED ORDER — METHYLPREDNISOLONE ACETATE 40 MG/ML INJ SUSP (RADIOLOG
120.0000 mg | Freq: Once | INTRAMUSCULAR | Status: AC
  Administered 2014-07-08: 120 mg via EPIDURAL

## 2014-07-08 MED ORDER — IOHEXOL 180 MG/ML  SOLN
1.0000 mL | Freq: Once | INTRAMUSCULAR | Status: AC | PRN
  Administered 2014-07-08: 1 mL via EPIDURAL

## 2014-07-08 NOTE — Discharge Instructions (Signed)

## 2014-07-14 DIAGNOSIS — M1710 Unilateral primary osteoarthritis, unspecified knee: Secondary | ICD-10-CM | POA: Diagnosis not present

## 2014-07-16 ENCOUNTER — Other Ambulatory Visit: Payer: Self-pay | Admitting: Internal Medicine

## 2014-07-16 DIAGNOSIS — R809 Proteinuria, unspecified: Secondary | ICD-10-CM | POA: Diagnosis not present

## 2014-07-16 DIAGNOSIS — F329 Major depressive disorder, single episode, unspecified: Secondary | ICD-10-CM | POA: Diagnosis not present

## 2014-07-16 DIAGNOSIS — R82998 Other abnormal findings in urine: Secondary | ICD-10-CM | POA: Diagnosis not present

## 2014-07-16 DIAGNOSIS — E039 Hypothyroidism, unspecified: Secondary | ICD-10-CM | POA: Diagnosis not present

## 2014-07-16 DIAGNOSIS — Z1231 Encounter for screening mammogram for malignant neoplasm of breast: Secondary | ICD-10-CM

## 2014-07-16 DIAGNOSIS — I10 Essential (primary) hypertension: Secondary | ICD-10-CM | POA: Diagnosis not present

## 2014-07-16 DIAGNOSIS — Z6827 Body mass index (BMI) 27.0-27.9, adult: Secondary | ICD-10-CM | POA: Diagnosis not present

## 2014-07-16 DIAGNOSIS — N184 Chronic kidney disease, stage 4 (severe): Secondary | ICD-10-CM | POA: Diagnosis not present

## 2014-07-21 DIAGNOSIS — M1711 Unilateral primary osteoarthritis, right knee: Secondary | ICD-10-CM | POA: Diagnosis not present

## 2014-07-23 ENCOUNTER — Other Ambulatory Visit: Payer: Self-pay | Admitting: Orthopedic Surgery

## 2014-07-23 DIAGNOSIS — M79605 Pain in left leg: Secondary | ICD-10-CM

## 2014-07-23 DIAGNOSIS — M545 Low back pain: Secondary | ICD-10-CM

## 2014-07-25 ENCOUNTER — Ambulatory Visit
Admission: RE | Admit: 2014-07-25 | Discharge: 2014-07-25 | Disposition: A | Discharge status: Home / Self Care | Payer: Medicare Other | Source: Ambulatory Visit | Attending: Orthopedic Surgery | Admitting: Orthopedic Surgery

## 2014-07-25 DIAGNOSIS — M79605 Pain in left leg: Secondary | ICD-10-CM

## 2014-07-25 DIAGNOSIS — M545 Low back pain: Secondary | ICD-10-CM | POA: Diagnosis not present

## 2014-07-25 MED ORDER — METHYLPREDNISOLONE ACETATE 40 MG/ML INJ SUSP (RADIOLOG
120.0000 mg | Freq: Once | INTRAMUSCULAR | Status: AC
  Administered 2014-07-25: 120 mg via EPIDURAL

## 2014-07-25 MED ORDER — IOHEXOL 180 MG/ML  SOLN
1.0000 mL | Freq: Once | INTRAMUSCULAR | Status: AC | PRN
  Administered 2014-07-25: 1 mL via EPIDURAL

## 2014-07-28 DIAGNOSIS — M1711 Unilateral primary osteoarthritis, right knee: Secondary | ICD-10-CM | POA: Diagnosis not present

## 2014-09-02 ENCOUNTER — Ambulatory Visit
Admission: RE | Admit: 2014-09-02 | Discharge: 2014-09-02 | Disposition: A | Discharge status: Home / Self Care | Payer: Medicare Other | Source: Ambulatory Visit | Attending: Internal Medicine | Admitting: Internal Medicine

## 2014-09-02 DIAGNOSIS — Z1231 Encounter for screening mammogram for malignant neoplasm of breast: Secondary | ICD-10-CM | POA: Diagnosis not present

## 2014-09-15 DIAGNOSIS — E039 Hypothyroidism, unspecified: Secondary | ICD-10-CM | POA: Diagnosis not present

## 2014-10-21 DIAGNOSIS — M79643 Pain in unspecified hand: Secondary | ICD-10-CM | POA: Diagnosis not present

## 2014-10-21 DIAGNOSIS — M79641 Pain in right hand: Secondary | ICD-10-CM | POA: Diagnosis not present

## 2014-10-21 DIAGNOSIS — M170 Bilateral primary osteoarthritis of knee: Secondary | ICD-10-CM | POA: Diagnosis not present

## 2014-10-21 DIAGNOSIS — M25569 Pain in unspecified knee: Secondary | ICD-10-CM | POA: Diagnosis not present

## 2014-10-21 DIAGNOSIS — M79642 Pain in left hand: Secondary | ICD-10-CM | POA: Diagnosis not present

## 2014-12-04 ENCOUNTER — Other Ambulatory Visit: Payer: Self-pay | Admitting: Orthopedic Surgery

## 2014-12-04 DIAGNOSIS — M545 Low back pain: Secondary | ICD-10-CM

## 2014-12-11 ENCOUNTER — Ambulatory Visit
Admission: RE | Admit: 2014-12-11 | Discharge: 2014-12-11 | Disposition: A | Discharge status: Home / Self Care | Payer: Medicare Other | Source: Ambulatory Visit | Attending: Orthopedic Surgery | Admitting: Orthopedic Surgery

## 2014-12-11 DIAGNOSIS — M545 Low back pain: Secondary | ICD-10-CM

## 2014-12-11 DIAGNOSIS — M5416 Radiculopathy, lumbar region: Secondary | ICD-10-CM | POA: Diagnosis not present

## 2014-12-11 MED ORDER — METHYLPREDNISOLONE ACETATE 40 MG/ML INJ SUSP (RADIOLOG
120.0000 mg | Freq: Once | INTRAMUSCULAR | Status: AC
  Administered 2014-12-11: 120 mg via EPIDURAL

## 2014-12-11 MED ORDER — IOHEXOL 180 MG/ML  SOLN
1.0000 mL | Freq: Once | INTRAMUSCULAR | Status: AC | PRN
  Administered 2014-12-11: 1 mL via EPIDURAL

## 2014-12-11 NOTE — Discharge Instructions (Signed)

## 2014-12-25 DIAGNOSIS — N2581 Secondary hyperparathyroidism of renal origin: Secondary | ICD-10-CM | POA: Diagnosis not present

## 2014-12-25 DIAGNOSIS — M109 Gout, unspecified: Secondary | ICD-10-CM | POA: Diagnosis not present

## 2014-12-25 DIAGNOSIS — N184 Chronic kidney disease, stage 4 (severe): Secondary | ICD-10-CM | POA: Diagnosis not present

## 2014-12-25 DIAGNOSIS — I129 Hypertensive chronic kidney disease with stage 1 through stage 4 chronic kidney disease, or unspecified chronic kidney disease: Secondary | ICD-10-CM | POA: Diagnosis not present

## 2014-12-29 DIAGNOSIS — D485 Neoplasm of uncertain behavior of skin: Secondary | ICD-10-CM | POA: Diagnosis not present

## 2014-12-29 DIAGNOSIS — D2362 Other benign neoplasm of skin of left upper limb, including shoulder: Secondary | ICD-10-CM | POA: Diagnosis not present

## 2015-01-15 DIAGNOSIS — M25462 Effusion, left knee: Secondary | ICD-10-CM | POA: Diagnosis not present

## 2015-01-15 DIAGNOSIS — M170 Bilateral primary osteoarthritis of knee: Secondary | ICD-10-CM | POA: Diagnosis not present

## 2015-01-22 DIAGNOSIS — R8290 Unspecified abnormal findings in urine: Secondary | ICD-10-CM | POA: Diagnosis not present

## 2015-01-22 DIAGNOSIS — I10 Essential (primary) hypertension: Secondary | ICD-10-CM | POA: Diagnosis not present

## 2015-01-22 DIAGNOSIS — E039 Hypothyroidism, unspecified: Secondary | ICD-10-CM | POA: Diagnosis not present

## 2015-03-04 DIAGNOSIS — M170 Bilateral primary osteoarthritis of knee: Secondary | ICD-10-CM | POA: Diagnosis not present

## 2015-03-20 ENCOUNTER — Other Ambulatory Visit: Payer: Self-pay | Admitting: Orthopedic Surgery

## 2015-03-20 DIAGNOSIS — G8929 Other chronic pain: Secondary | ICD-10-CM

## 2015-03-20 DIAGNOSIS — M545 Low back pain, unspecified: Secondary | ICD-10-CM

## 2015-03-31 ENCOUNTER — Other Ambulatory Visit: Payer: Medicare Other

## 2015-03-31 ENCOUNTER — Ambulatory Visit
Admission: RE | Admit: 2015-03-31 | Discharge: 2015-03-31 | Disposition: A | Discharge status: Home / Self Care | Payer: Medicare Other | Source: Ambulatory Visit | Attending: Orthopedic Surgery | Admitting: Orthopedic Surgery

## 2015-03-31 DIAGNOSIS — M545 Low back pain, unspecified: Secondary | ICD-10-CM

## 2015-03-31 DIAGNOSIS — G8929 Other chronic pain: Secondary | ICD-10-CM

## 2015-03-31 DIAGNOSIS — M5416 Radiculopathy, lumbar region: Secondary | ICD-10-CM | POA: Diagnosis not present

## 2015-03-31 MED ORDER — IOHEXOL 180 MG/ML  SOLN
1.0000 mL | Freq: Once | INTRAMUSCULAR | Status: AC | PRN
  Administered 2015-03-31: 1 mL via EPIDURAL

## 2015-03-31 MED ORDER — METHYLPREDNISOLONE ACETATE 40 MG/ML INJ SUSP (RADIOLOG
120.0000 mg | Freq: Once | INTRAMUSCULAR | Status: AC
  Administered 2015-03-31: 120 mg via EPIDURAL

## 2015-04-06 DIAGNOSIS — H35351 Cystoid macular degeneration, right eye: Secondary | ICD-10-CM | POA: Diagnosis not present

## 2015-04-06 DIAGNOSIS — H33101 Unspecified retinoschisis, right eye: Secondary | ICD-10-CM | POA: Diagnosis not present

## 2015-04-06 DIAGNOSIS — H35373 Puckering of macula, bilateral: Secondary | ICD-10-CM | POA: Diagnosis not present

## 2015-05-13 DIAGNOSIS — Z6828 Body mass index (BMI) 28.0-28.9, adult: Secondary | ICD-10-CM | POA: Diagnosis not present

## 2015-05-13 DIAGNOSIS — M1990 Unspecified osteoarthritis, unspecified site: Secondary | ICD-10-CM | POA: Diagnosis not present

## 2015-05-13 DIAGNOSIS — F329 Major depressive disorder, single episode, unspecified: Secondary | ICD-10-CM | POA: Diagnosis not present

## 2015-05-13 DIAGNOSIS — R109 Unspecified abdominal pain: Secondary | ICD-10-CM | POA: Diagnosis not present

## 2015-05-13 DIAGNOSIS — K219 Gastro-esophageal reflux disease without esophagitis: Secondary | ICD-10-CM | POA: Diagnosis not present

## 2015-05-13 DIAGNOSIS — E039 Hypothyroidism, unspecified: Secondary | ICD-10-CM | POA: Diagnosis not present

## 2015-05-13 DIAGNOSIS — E538 Deficiency of other specified B group vitamins: Secondary | ICD-10-CM | POA: Diagnosis not present

## 2015-06-17 DIAGNOSIS — S233XXA Sprain of ligaments of thoracic spine, initial encounter: Secondary | ICD-10-CM | POA: Diagnosis not present

## 2015-06-17 DIAGNOSIS — M170 Bilateral primary osteoarthritis of knee: Secondary | ICD-10-CM | POA: Diagnosis not present

## 2015-06-29 ENCOUNTER — Other Ambulatory Visit: Payer: Self-pay | Admitting: Orthopedic Surgery

## 2015-06-29 ENCOUNTER — Ambulatory Visit
Admission: RE | Admit: 2015-06-29 | Discharge: 2015-06-29 | Disposition: A | Discharge status: Home / Self Care | Payer: Medicare Other | Source: Ambulatory Visit | Attending: Orthopedic Surgery | Admitting: Orthopedic Surgery

## 2015-06-29 DIAGNOSIS — M545 Low back pain, unspecified: Secondary | ICD-10-CM

## 2015-06-29 DIAGNOSIS — G8929 Other chronic pain: Secondary | ICD-10-CM

## 2015-06-29 MED ORDER — IOHEXOL 180 MG/ML  SOLN
1.0000 mL | Freq: Once | INTRAMUSCULAR | Status: DC | PRN
  Administered 2015-06-29: 1 mL via EPIDURAL

## 2015-06-29 MED ORDER — METHYLPREDNISOLONE ACETATE 40 MG/ML INJ SUSP (RADIOLOG
120.0000 mg | Freq: Once | INTRAMUSCULAR | Status: AC
  Administered 2015-06-29: 120 mg via EPIDURAL

## 2015-06-29 NOTE — Discharge Instructions (Signed)

## 2015-07-02 ENCOUNTER — Other Ambulatory Visit: Payer: Medicare Other

## 2015-07-13 DIAGNOSIS — M109 Gout, unspecified: Secondary | ICD-10-CM | POA: Diagnosis not present

## 2015-07-13 DIAGNOSIS — N2581 Secondary hyperparathyroidism of renal origin: Secondary | ICD-10-CM | POA: Diagnosis not present

## 2015-07-13 DIAGNOSIS — I129 Hypertensive chronic kidney disease with stage 1 through stage 4 chronic kidney disease, or unspecified chronic kidney disease: Secondary | ICD-10-CM | POA: Diagnosis not present

## 2015-07-13 DIAGNOSIS — N184 Chronic kidney disease, stage 4 (severe): Secondary | ICD-10-CM | POA: Diagnosis not present

## 2015-07-15 DIAGNOSIS — D120 Benign neoplasm of cecum: Secondary | ICD-10-CM | POA: Diagnosis not present

## 2015-07-15 DIAGNOSIS — D124 Benign neoplasm of descending colon: Secondary | ICD-10-CM | POA: Diagnosis not present

## 2015-07-15 DIAGNOSIS — D125 Benign neoplasm of sigmoid colon: Secondary | ICD-10-CM | POA: Diagnosis not present

## 2015-07-15 DIAGNOSIS — D126 Benign neoplasm of colon, unspecified: Secondary | ICD-10-CM | POA: Diagnosis not present

## 2015-07-15 DIAGNOSIS — K449 Diaphragmatic hernia without obstruction or gangrene: Secondary | ICD-10-CM | POA: Diagnosis not present

## 2015-07-15 DIAGNOSIS — D123 Benign neoplasm of transverse colon: Secondary | ICD-10-CM | POA: Diagnosis not present

## 2015-07-15 DIAGNOSIS — K219 Gastro-esophageal reflux disease without esophagitis: Secondary | ICD-10-CM | POA: Diagnosis not present

## 2015-07-15 DIAGNOSIS — D122 Benign neoplasm of ascending colon: Secondary | ICD-10-CM | POA: Diagnosis not present

## 2015-07-15 DIAGNOSIS — Z09 Encounter for follow-up examination after completed treatment for conditions other than malignant neoplasm: Secondary | ICD-10-CM | POA: Diagnosis not present

## 2015-07-15 DIAGNOSIS — K317 Polyp of stomach and duodenum: Secondary | ICD-10-CM | POA: Diagnosis not present

## 2015-07-15 DIAGNOSIS — Z86010 Personal history of colonic polyps: Secondary | ICD-10-CM | POA: Diagnosis not present

## 2015-08-19 ENCOUNTER — Other Ambulatory Visit: Payer: Self-pay | Admitting: Orthopedic Surgery

## 2015-08-19 DIAGNOSIS — M545 Low back pain, unspecified: Secondary | ICD-10-CM

## 2015-08-19 DIAGNOSIS — G8929 Other chronic pain: Secondary | ICD-10-CM

## 2015-08-27 ENCOUNTER — Ambulatory Visit
Admission: RE | Admit: 2015-08-27 | Discharge: 2015-08-27 | Disposition: A | Discharge status: Home / Self Care | Payer: Medicare Other | Source: Ambulatory Visit | Attending: Orthopedic Surgery | Admitting: Orthopedic Surgery

## 2015-08-27 DIAGNOSIS — M545 Low back pain, unspecified: Secondary | ICD-10-CM

## 2015-08-27 DIAGNOSIS — M47817 Spondylosis without myelopathy or radiculopathy, lumbosacral region: Secondary | ICD-10-CM | POA: Diagnosis not present

## 2015-08-27 DIAGNOSIS — G8929 Other chronic pain: Secondary | ICD-10-CM

## 2015-08-27 MED ORDER — METHYLPREDNISOLONE ACETATE 40 MG/ML INJ SUSP (RADIOLOG
120.0000 mg | Freq: Once | INTRAMUSCULAR | Status: AC
  Administered 2015-08-27: 120 mg via EPIDURAL

## 2015-08-27 MED ORDER — IOHEXOL 180 MG/ML  SOLN
1.0000 mL | Freq: Once | INTRAMUSCULAR | Status: DC | PRN
  Administered 2015-08-27: 1 mL via EPIDURAL

## 2015-09-14 DIAGNOSIS — E038 Other specified hypothyroidism: Secondary | ICD-10-CM | POA: Diagnosis not present

## 2015-09-30 ENCOUNTER — Other Ambulatory Visit: Payer: Self-pay | Admitting: Orthopedic Surgery

## 2015-09-30 DIAGNOSIS — G8929 Other chronic pain: Secondary | ICD-10-CM

## 2015-09-30 DIAGNOSIS — M545 Low back pain, unspecified: Secondary | ICD-10-CM

## 2015-10-01 DIAGNOSIS — M47816 Spondylosis without myelopathy or radiculopathy, lumbar region: Secondary | ICD-10-CM | POA: Diagnosis not present

## 2015-10-01 DIAGNOSIS — S139XXA Sprain of joints and ligaments of unspecified parts of neck, initial encounter: Secondary | ICD-10-CM | POA: Diagnosis not present

## 2015-10-01 DIAGNOSIS — S335XXA Sprain of ligaments of lumbar spine, initial encounter: Secondary | ICD-10-CM | POA: Diagnosis not present

## 2015-10-16 ENCOUNTER — Ambulatory Visit
Admission: RE | Admit: 2015-10-16 | Discharge: 2015-10-16 | Disposition: A | Discharge status: Home / Self Care | Payer: Medicare Other | Source: Ambulatory Visit | Attending: Orthopedic Surgery | Admitting: Orthopedic Surgery

## 2015-10-16 DIAGNOSIS — M545 Low back pain: Principal | ICD-10-CM

## 2015-10-16 DIAGNOSIS — G8929 Other chronic pain: Secondary | ICD-10-CM

## 2015-10-16 MED ORDER — IOHEXOL 180 MG/ML  SOLN
1.0000 mL | Freq: Once | INTRAMUSCULAR | Status: AC | PRN
Start: 2015-10-16 — End: 2015-10-16
  Administered 2015-10-16: 1 mL via EPIDURAL

## 2015-10-16 MED ORDER — METHYLPREDNISOLONE ACETATE 40 MG/ML INJ SUSP (RADIOLOG
120.0000 mg | Freq: Once | INTRAMUSCULAR | Status: AC
  Administered 2015-10-16: 120 mg via EPIDURAL

## 2015-10-29 DIAGNOSIS — M25561 Pain in right knee: Secondary | ICD-10-CM | POA: Diagnosis not present

## 2015-10-29 DIAGNOSIS — M25562 Pain in left knee: Secondary | ICD-10-CM | POA: Diagnosis not present

## 2015-11-05 DIAGNOSIS — E038 Other specified hypothyroidism: Secondary | ICD-10-CM | POA: Diagnosis not present

## 2015-11-10 DIAGNOSIS — N390 Urinary tract infection, site not specified: Secondary | ICD-10-CM | POA: Diagnosis not present

## 2015-11-10 DIAGNOSIS — R300 Dysuria: Secondary | ICD-10-CM | POA: Diagnosis not present

## 2015-11-18 ENCOUNTER — Other Ambulatory Visit: Payer: Self-pay | Admitting: Orthopedic Surgery

## 2015-11-18 DIAGNOSIS — G8929 Other chronic pain: Secondary | ICD-10-CM

## 2015-11-18 DIAGNOSIS — M545 Low back pain, unspecified: Secondary | ICD-10-CM

## 2015-12-02 ENCOUNTER — Other Ambulatory Visit: Payer: Medicare Other

## 2015-12-07 ENCOUNTER — Ambulatory Visit
Admission: RE | Admit: 2015-12-07 | Discharge: 2015-12-07 | Disposition: A | Discharge status: Home / Self Care | Payer: Medicare Other | Source: Ambulatory Visit | Attending: Orthopedic Surgery | Admitting: Orthopedic Surgery

## 2015-12-07 DIAGNOSIS — G8929 Other chronic pain: Secondary | ICD-10-CM

## 2015-12-07 DIAGNOSIS — M47817 Spondylosis without myelopathy or radiculopathy, lumbosacral region: Secondary | ICD-10-CM | POA: Diagnosis not present

## 2015-12-07 DIAGNOSIS — M545 Low back pain: Principal | ICD-10-CM

## 2015-12-07 MED ORDER — IOHEXOL 180 MG/ML  SOLN
1.0000 mL | Freq: Once | INTRAMUSCULAR | Status: AC | PRN
  Administered 2015-12-07: 1 mL via EPIDURAL

## 2015-12-07 MED ORDER — METHYLPREDNISOLONE ACETATE 40 MG/ML INJ SUSP (RADIOLOG
120.0000 mg | Freq: Once | INTRAMUSCULAR | Status: AC
  Administered 2015-12-07: 120 mg via EPIDURAL

## 2016-01-07 ENCOUNTER — Encounter: Payer: Self-pay | Admitting: Gastroenterology

## 2016-01-07 DIAGNOSIS — M5416 Radiculopathy, lumbar region: Secondary | ICD-10-CM | POA: Diagnosis not present

## 2016-01-07 DIAGNOSIS — M170 Bilateral primary osteoarthritis of knee: Secondary | ICD-10-CM | POA: Diagnosis not present

## 2016-01-11 DIAGNOSIS — M545 Low back pain: Secondary | ICD-10-CM | POA: Diagnosis not present

## 2016-01-12 DIAGNOSIS — E039 Hypothyroidism, unspecified: Secondary | ICD-10-CM | POA: Diagnosis not present

## 2016-01-12 DIAGNOSIS — N2581 Secondary hyperparathyroidism of renal origin: Secondary | ICD-10-CM | POA: Diagnosis not present

## 2016-01-12 DIAGNOSIS — I129 Hypertensive chronic kidney disease with stage 1 through stage 4 chronic kidney disease, or unspecified chronic kidney disease: Secondary | ICD-10-CM | POA: Diagnosis not present

## 2016-01-12 DIAGNOSIS — N184 Chronic kidney disease, stage 4 (severe): Secondary | ICD-10-CM | POA: Diagnosis not present

## 2016-01-12 DIAGNOSIS — R339 Retention of urine, unspecified: Secondary | ICD-10-CM | POA: Diagnosis not present

## 2016-01-12 DIAGNOSIS — F329 Major depressive disorder, single episode, unspecified: Secondary | ICD-10-CM | POA: Diagnosis not present

## 2016-01-12 DIAGNOSIS — M109 Gout, unspecified: Secondary | ICD-10-CM | POA: Diagnosis not present

## 2016-01-16 DIAGNOSIS — M25551 Pain in right hip: Secondary | ICD-10-CM | POA: Diagnosis not present

## 2016-01-26 DIAGNOSIS — E038 Other specified hypothyroidism: Secondary | ICD-10-CM | POA: Diagnosis not present

## 2016-01-26 DIAGNOSIS — R358 Other polyuria: Secondary | ICD-10-CM | POA: Diagnosis not present

## 2016-01-26 DIAGNOSIS — I10 Essential (primary) hypertension: Secondary | ICD-10-CM | POA: Diagnosis not present

## 2016-01-26 DIAGNOSIS — E538 Deficiency of other specified B group vitamins: Secondary | ICD-10-CM | POA: Diagnosis not present

## 2016-01-26 DIAGNOSIS — N184 Chronic kidney disease, stage 4 (severe): Secondary | ICD-10-CM | POA: Diagnosis not present

## 2016-01-26 DIAGNOSIS — R8290 Unspecified abnormal findings in urine: Secondary | ICD-10-CM | POA: Diagnosis not present

## 2016-01-27 DIAGNOSIS — M7061 Trochanteric bursitis, right hip: Secondary | ICD-10-CM | POA: Diagnosis not present

## 2016-02-04 ENCOUNTER — Other Ambulatory Visit: Payer: Self-pay | Admitting: Internal Medicine

## 2016-02-04 DIAGNOSIS — Z1231 Encounter for screening mammogram for malignant neoplasm of breast: Secondary | ICD-10-CM

## 2016-02-22 ENCOUNTER — Ambulatory Visit
Admission: RE | Admit: 2016-02-22 | Discharge: 2016-02-22 | Disposition: A | Discharge status: Home / Self Care | Payer: Medicare Other | Source: Ambulatory Visit | Attending: Internal Medicine | Admitting: Internal Medicine

## 2016-02-22 DIAGNOSIS — Z1231 Encounter for screening mammogram for malignant neoplasm of breast: Secondary | ICD-10-CM

## 2016-02-25 ENCOUNTER — Emergency Department (HOSPITAL_COMMUNITY): Payer: Medicare Other

## 2016-02-25 ENCOUNTER — Emergency Department (HOSPITAL_COMMUNITY)
Admission: EM | Admit: 2016-02-25 | Discharge: 2016-02-25 | Disposition: A | Discharge status: Home / Self Care | Payer: Medicare Other | Attending: Emergency Medicine | Admitting: Emergency Medicine

## 2016-02-25 ENCOUNTER — Encounter (HOSPITAL_COMMUNITY): Payer: Self-pay

## 2016-02-25 DIAGNOSIS — Y9301 Activity, walking, marching and hiking: Secondary | ICD-10-CM | POA: Diagnosis not present

## 2016-02-25 DIAGNOSIS — Z79891 Long term (current) use of opiate analgesic: Secondary | ICD-10-CM | POA: Diagnosis not present

## 2016-02-25 DIAGNOSIS — Y92480 Sidewalk as the place of occurrence of the external cause: Secondary | ICD-10-CM | POA: Diagnosis not present

## 2016-02-25 DIAGNOSIS — W0110XA Fall on same level from slipping, tripping and stumbling with subsequent striking against unspecified object, initial encounter: Secondary | ICD-10-CM | POA: Insufficient documentation

## 2016-02-25 DIAGNOSIS — S0181XA Laceration without foreign body of other part of head, initial encounter: Secondary | ICD-10-CM | POA: Diagnosis present

## 2016-02-25 DIAGNOSIS — R51 Headache: Secondary | ICD-10-CM | POA: Diagnosis not present

## 2016-02-25 DIAGNOSIS — Z79899 Other long term (current) drug therapy: Secondary | ICD-10-CM | POA: Diagnosis not present

## 2016-02-25 DIAGNOSIS — W19XXXA Unspecified fall, initial encounter: Secondary | ICD-10-CM

## 2016-02-25 DIAGNOSIS — I10 Essential (primary) hypertension: Secondary | ICD-10-CM | POA: Diagnosis not present

## 2016-02-25 DIAGNOSIS — Y999 Unspecified external cause status: Secondary | ICD-10-CM | POA: Insufficient documentation

## 2016-02-25 DIAGNOSIS — S0990XA Unspecified injury of head, initial encounter: Secondary | ICD-10-CM | POA: Diagnosis not present

## 2016-02-25 HISTORY — DX: Essential (primary) hypertension: I10

## 2016-02-25 HISTORY — DX: Disorder of thyroid, unspecified: E07.9

## 2016-02-25 HISTORY — DX: Disorder of kidney and ureter, unspecified: N28.9

## 2016-02-25 MED ORDER — ACETAMINOPHEN 500 MG PO TABS: 1000.0000 mg | ORAL_TABLET | Freq: Once | ORAL | Status: DC

## 2016-02-25 MED ORDER — BACITRACIN ZINC 500 UNIT/GM EX OINT
TOPICAL_OINTMENT | Freq: Two times a day (BID) | CUTANEOUS | Status: DC
  Administered 2016-02-25: 16:00:00 via TOPICAL

## 2016-02-25 MED ORDER — OXYCODONE-ACETAMINOPHEN 5-325 MG PO TABS
1.0000 | ORAL_TABLET | Freq: Once | ORAL | Status: AC
  Administered 2016-02-25: 1 via ORAL
  Filled 2016-02-25: qty 1

## 2016-02-25 MED ORDER — OXYCODONE-ACETAMINOPHEN 5-325 MG PO TABS: 1.0000 | ORAL_TABLET | ORAL | Status: DC | PRN

## 2016-02-25 NOTE — Discharge Instructions (Signed)
Facial Laceration ° A facial laceration is a cut on the face. These injuries can be painful and cause bleeding. Lacerations usually heal quickly, but they need special care to reduce scarring. °DIAGNOSIS  °Your health care provider will take a medical history, ask for details about how the injury occurred, and examine the wound to determine how deep the cut is. °TREATMENT  °Some facial lacerations may not require closure. Others may not be able to be closed because of an increased risk of infection. The risk of infection and the chance for successful closure will depend on various factors, including the amount of time since the injury occurred. °The wound may be cleaned to help prevent infection. If closure is appropriate, pain medicines may be given if needed. Your health care provider will use stitches (sutures), wound glue (adhesive), or skin adhesive strips to repair the laceration. These tools bring the skin edges together to allow for faster healing and a better cosmetic outcome. If needed, you may also be given a tetanus shot. °HOME CARE INSTRUCTIONS °· Only take over-the-counter or prescription medicines as directed by your health care provider. °· Follow your health care provider's instructions for wound care. These instructions will vary depending on the technique used for closing the wound. °For Sutures: °· Keep the wound clean and dry.   °· If you were given a bandage (dressing), you should change it at least once a day. Also change the dressing if it becomes wet or dirty, or as directed by your health care provider.   °· Wash the wound with soap and water 2 times a day. Rinse the wound off with water to remove all soap. Pat the wound dry with a clean towel.   °· After cleaning, apply a thin layer of the antibiotic ointment recommended by your health care provider. This will help prevent infection and keep the dressing from sticking.   °· You may shower as usual after the first 24 hours. Do not soak the  wound in water until the sutures are removed.   °· Get your sutures removed as directed by your health care provider. With facial lacerations, sutures should usually be taken out after 4-5 days to avoid stitch marks.   °· Wait a few days after your sutures are removed before applying any makeup. °For Skin Adhesive Strips: °· Keep the wound clean and dry.   °· Do not get the skin adhesive strips wet. You may bathe carefully, using caution to keep the wound dry.   °· If the wound gets wet, pat it dry with a clean towel.   °· Skin adhesive strips will fall off on their own. You may trim the strips as the wound heals. Do not remove skin adhesive strips that are still stuck to the wound. They will fall off in time.   °For Wound Adhesive: °· You may briefly wet your wound in the shower or bath. Do not soak or scrub the wound. Do not swim. Avoid periods of heavy sweating until the skin adhesive has fallen off on its own. After showering or bathing, gently pat the wound dry with a clean towel.   °· Do not apply liquid medicine, cream medicine, ointment medicine, or makeup to your wound while the skin adhesive is in place. This may loosen the film before your wound is healed.   °· If a dressing is placed over the wound, be careful not to apply tape directly over the skin adhesive. This may cause the adhesive to be pulled off before the wound is healed.   °· Avoid   prolonged exposure to sunlight or tanning lamps while the skin adhesive is in place. °· The skin adhesive will usually remain in place for 5-10 days, then naturally fall off the skin. Do not pick at the adhesive film.   °After Healing: °Once the wound has healed, cover the wound with sunscreen during the day for 1 full year. This can help minimize scarring. Exposure to ultraviolet light in the first year will darken the scar. It can take 1-2 years for the scar to lose its redness and to heal completely.  °SEEK MEDICAL CARE IF: °· You have a fever. °SEEK IMMEDIATE  MEDICAL CARE IF: °· You have redness, pain, or swelling around the wound.   °· You see a yellowish-white fluid (pus) coming from the wound.   °  °This information is not intended to replace advice given to you by your health care provider. Make sure you discuss any questions you have with your health care provider. °  °Document Released: 11/10/2004 Document Revised: 10/24/2014 Document Reviewed: 05/16/2013 °Elsevier Interactive Patient Education ©2016 [employer] ° °Head Injury, Adult °You have a head injury. Headaches and throwing up (vomiting) are common after a head injury. It should be easy to wake up from sleeping. Sometimes you must stay in the hospital. Most problems happen within the first 24 hours. Side effects may occur up to 7-10 days after the injury.  °WHAT ARE THE TYPES OF HEAD INJURIES? °Head injuries can be as minor as a bump. Some head injuries can be more severe. More severe head injuries include: °· A jarring injury to the brain (concussion). °· A bruise of the brain (contusion). This mean there is bleeding in the brain that can cause swelling. °· A cracked skull (skull fracture). °· Bleeding in the brain that collects, clots, and forms a bump (hematoma). °WHEN SHOULD I GET HELP RIGHT AWAY?  °· You are confused or sleepy. °· You cannot be woken up. °· You feel sick to your stomach (nauseous) or keep throwing up (vomiting). °· Your dizziness or unsteadiness is getting worse. °· You have very bad, lasting headaches that are not helped by medicine. Take medicines only as told by your doctor. °· You cannot use your arms or legs like normal. °· You cannot walk. °· You notice changes in the black spots in the center of the colored part of your eye (pupil). °· You have clear or bloody fluid coming from your nose or ears. °· You have trouble seeing. °During the next 24 hours after the injury, you must stay with someone who can watch you. This person should get help right away (call 911 in the [place] if  you start to shake and are not able to control it (have seizures), you pass out, or you are unable to wake up. °HOW CAN I PREVENT A HEAD INJURY IN THE FUTURE? °· Wear seat belts. °· Wear a helmet while bike riding and playing sports like football. °· Stay away from dangerous activities around the house. °WHEN CAN I RETURN TO NORMAL ACTIVITIES AND ATHLETICS? °See your doctor before doing these activities. You should not do normal activities or play contact sports until 1 week after the following symptoms have stopped: °· Headache that does not go away. °· Dizziness. °· Poor attention. °· Confusion. °· Memory problems. °· Sickness to your stomach or throwing up. °· Tiredness. °· Fussiness. °· Bothered by bright lights or loud noises. °· Anxiousness or depression. °· Restless sleep. °MAKE SURE YOU:  °· Understand these instructions. °· Will   watch your condition. °· Will get help right away if you are not doing well or get worse. °  °This information is not intended to replace advice given to you by your health care provider. Make sure you discuss any questions you have with your health care provider. °  °Document Released: 09/15/2008 Document Revised: 10/24/2014 Document Reviewed: 06/10/2013 °Elsevier Interactive Patient Education ©2016 [employer] ° °

## 2016-02-25 NOTE — ED Provider Notes (Signed)
CSN: [id number]     Arrival date & time 02/25/16  1443 History   First MD Initiated Contact with Patient 02/25/16 1447     Chief Complaint  Patient presents with  . Fall  . Head Injury     (Consider location/radiation/quality/duration/timing/severity/associated sxs/prior Treatment) HPI Patient presents after trip and fall from standing. States she was walking across uneven pavement and fell and hit her right knee and forehead. There was no loss of consciousness. Sustained minor laceration to the forehead. Denies any neck pain. Complains of headache. No visual changes. No nausea or vomiting. Patient's been ambulatory since fall. States she is up-to-date on her tetanus.  Past Medical History  Diagnosis Date  . Renal disorder   . Thyroid disease   . Hypertension    Past Surgical History  Procedure Laterality Date  . Knee surgery    . Tonsillectomy    . Cholecystectomy    . Dilation and curettage of uterus     History reviewed. No pertinent family history. Social History  Substance Use Topics  . Smoking status: Never Smoker   . Smokeless tobacco: None  . Alcohol Use: No   OB History    No data available     Review of Systems  Constitutional: Negative for fever and chills.  HENT: Negative for facial swelling.   Eyes: Negative for visual disturbance.  Respiratory: Negative for shortness of breath.   Gastrointestinal: Negative for nausea, vomiting and abdominal pain.  Musculoskeletal: Negative for back pain, arthralgias and neck pain.  Skin: Positive for wound.  Neurological: Positive for headaches. Negative for dizziness, syncope, weakness, light-headedness and numbness.  All other systems reviewed and are negative.     Allergies  Penicillins; Sulfa antibiotics; Codeine; Hydrocodone-acetaminophen; and Naproxen sodium  Home Medications   Prior to Admission medications   Medication Sig Start Date End Date Taking? Authorizing Provider  calcitRIOL (ROCALTROL) 0.25 MCG  capsule Take 0.25 mcg by mouth daily.    Historical Provider, MD  cholecalciferol (VITAMIN D) 1000 UNITS tablet Take 1,000 Units by mouth daily.    Historical Provider, MD  DULoxetine (CYMBALTA) 60 MG capsule Take 60 mg by mouth at bedtime.    Historical Provider, MD  esomeprazole (NEXIUM) 40 MG capsule Take 40 mg by mouth daily before breakfast.    Historical Provider, MD  estrogen, conjugated,-medroxyprogesterone (PREMPRO) 0.625-2.5 MG per tablet Take 1 tablet by mouth daily.    Historical Provider, MD  febuxostat (ULORIC) 40 MG tablet Take 80 mg by mouth at bedtime.    Historical Provider, MD  furosemide (LASIX) 40 MG tablet Take 40 mg by mouth 2 (two) times daily.    Historical Provider, MD  levothyroxine (SYNTHROID, LEVOTHROID) 125 MCG tablet Take 125 mcg by mouth daily before breakfast.    Historical Provider, MD  oxyCODONE-acetaminophen (PERCOCET/ROXICET) 5-325 MG per tablet Take 1 tablet by mouth every 4 (four) hours as needed for pain.    Historical Provider, MD  zolpidem (AMBIEN) 10 MG tablet Take 10 mg by mouth at bedtime.    Historical Provider, MD   BP 140/77 mmHg  Pulse 100  Temp(Src) 98.2 F (36.8 C) (Oral)  Resp 14  SpO2 94% Physical Exam  Constitutional: She is oriented to person, place, and time. She appears well-developed and well-nourished. No distress.  HENT:  Head: Normocephalic.  Mouth/Throat: Oropharynx is clear and moist.  Patient has superficial laceration to the right side of her forehead. There is no obvious swelling or bony deformity. No hemotympanum and  posterior auricular ecchymosis. Midface is stable.  Eyes: EOM are normal. Pupils are equal, round, and reactive to light.  Neck: Normal range of motion. Neck supple.  No posterior midline cervical tenderness to palpation.  Cardiovascular: Normal rate and regular rhythm.  Exam reveals no gallop and no friction rub.   No murmur heard. Pulmonary/Chest: Effort normal and breath sounds normal. No respiratory  distress. She has no wheezes. She has no rales. She exhibits no tenderness.  Abdominal: Soft. Bowel sounds are normal. She exhibits no distension and no mass. There is no tenderness. There is no rebound and no guarding.  Musculoskeletal: Normal range of motion. She exhibits no edema or tenderness.  Patient has contusion overlying the right knee. She has full range of motion. No ligamentous instability. Distal pulses are equal and intact. No midline thoracic or lumbar tenderness. Pelvis stable.  Neurological: She is alert and oriented to person, place, and time.  5/5 motor in all extremities. Sensation is fully intact. Patient ambulatory without assistance.  Skin: Skin is warm and dry. No rash noted. No erythema.  Psychiatric: She has a normal mood and affect. Her behavior is normal.  Nursing note and vitals reviewed.   ED Course  Procedures (including critical care time) Labs Review Labs Reviewed - No data to display  Imaging Review Ct Head Wo Contrast  02/25/2016  CLINICAL DATA:  Pain following fall EXAM: CT HEAD WITHOUT CONTRAST TECHNIQUE: Contiguous axial images were obtained from the base of the skull through the vertex without intravenous contrast. COMPARISON:  Head CT July 22, 2013 and brain MRI July 27, 2013 FINDINGS: Moderate diffuse atrophy is stable. There is no intracranial mass, hemorrhage, extra-axial fluid collection, or midline shift. There is patchy small vessel disease throughout the centra semiovale bilaterally. 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 Patchy small vessel disease is noted in the internal and external capsules bilaterally. No acute infarct evident. No new gray-white compartment lesions. The bony calvarium appears intact. The mastoid air cells are clear. Orbits appear symmetric bilaterally. There is minimal mucosal thickening in the posterior aspect of the left maxillary antrum. 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 No intracranial mass, hemorrhage, or acute appearing infarct. Minimal mucosal thickening posterior left maxillary antrum. Electronically Signed   By: Lowella Grip III M.D.   On: 02/25/2016 16:07   I have personally reviewed and evaluated these images and lab results as part of my medical decision-making.   EKG Interpretation None      MDM   Final diagnoses:  Fall from standing, initial encounter  Forehead laceration, initial encounter  Continues to have a normal neurologic exam. CT head without acute intracranial finding. Given this was a trip and fall topically that further workup is necessary at this point. Wound is then cleaned and bacitracin applied. Given superficial naturesutures were needed. Advised to follow-up with her primary physician and return caution for head injury have been given.    Julianne Rice, MD 02/25/16 [phone]

## 2016-02-25 NOTE — ED Notes (Signed)
Wound cleaned with bacitracin and steristrip applied

## 2016-02-25 NOTE — ED Notes (Signed)
Per pt, fell on uneven pavement.  Tripped on feet.  Head injury to rt forehead.  No LOC.  Rt knee injury noted also.  Bruising to rt hand

## 2016-03-09 DIAGNOSIS — M1711 Unilateral primary osteoarthritis, right knee: Secondary | ICD-10-CM | POA: Diagnosis not present

## 2016-03-09 DIAGNOSIS — M7061 Trochanteric bursitis, right hip: Secondary | ICD-10-CM | POA: Diagnosis not present

## 2016-03-16 DIAGNOSIS — M1711 Unilateral primary osteoarthritis, right knee: Secondary | ICD-10-CM | POA: Diagnosis not present

## 2016-03-17 DIAGNOSIS — M7061 Trochanteric bursitis, right hip: Secondary | ICD-10-CM | POA: Diagnosis not present

## 2016-03-17 DIAGNOSIS — M25561 Pain in right knee: Secondary | ICD-10-CM | POA: Diagnosis not present

## 2016-03-17 DIAGNOSIS — M25551 Pain in right hip: Secondary | ICD-10-CM | POA: Diagnosis not present

## 2016-03-17 DIAGNOSIS — M25562 Pain in left knee: Secondary | ICD-10-CM | POA: Diagnosis not present

## 2016-03-23 DIAGNOSIS — M25562 Pain in left knee: Secondary | ICD-10-CM | POA: Diagnosis not present

## 2016-03-23 DIAGNOSIS — M1711 Unilateral primary osteoarthritis, right knee: Secondary | ICD-10-CM | POA: Diagnosis not present

## 2016-03-30 DIAGNOSIS — M79652 Pain in left thigh: Secondary | ICD-10-CM | POA: Diagnosis not present

## 2016-03-30 DIAGNOSIS — M7061 Trochanteric bursitis, right hip: Secondary | ICD-10-CM | POA: Diagnosis not present

## 2016-03-31 DIAGNOSIS — M25561 Pain in right knee: Secondary | ICD-10-CM | POA: Diagnosis not present

## 2016-03-31 DIAGNOSIS — M25562 Pain in left knee: Secondary | ICD-10-CM | POA: Diagnosis not present

## 2016-03-31 DIAGNOSIS — M7061 Trochanteric bursitis, right hip: Secondary | ICD-10-CM | POA: Diagnosis not present

## 2016-03-31 DIAGNOSIS — M25551 Pain in right hip: Secondary | ICD-10-CM | POA: Diagnosis not present

## 2016-04-05 DIAGNOSIS — H33101 Unspecified retinoschisis, right eye: Secondary | ICD-10-CM | POA: Diagnosis not present

## 2016-04-05 DIAGNOSIS — H35371 Puckering of macula, right eye: Secondary | ICD-10-CM | POA: Diagnosis not present

## 2016-04-05 DIAGNOSIS — H35372 Puckering of macula, left eye: Secondary | ICD-10-CM | POA: Diagnosis not present

## 2016-04-05 DIAGNOSIS — H35351 Cystoid macular degeneration, right eye: Secondary | ICD-10-CM | POA: Diagnosis not present

## 2016-04-05 DIAGNOSIS — H43811 Vitreous degeneration, right eye: Secondary | ICD-10-CM | POA: Diagnosis not present

## 2016-04-20 DIAGNOSIS — H35371 Puckering of macula, right eye: Secondary | ICD-10-CM | POA: Diagnosis not present

## 2016-04-20 DIAGNOSIS — H547 Unspecified visual loss: Secondary | ICD-10-CM | POA: Diagnosis not present

## 2016-05-12 DIAGNOSIS — M170 Bilateral primary osteoarthritis of knee: Secondary | ICD-10-CM | POA: Diagnosis not present

## 2016-05-27 DIAGNOSIS — Z79890 Hormone replacement therapy (postmenopausal): Secondary | ICD-10-CM | POA: Diagnosis not present

## 2016-05-27 DIAGNOSIS — N950 Postmenopausal bleeding: Secondary | ICD-10-CM | POA: Diagnosis not present

## 2016-05-27 DIAGNOSIS — Z01419 Encounter for gynecological examination (general) (routine) without abnormal findings: Secondary | ICD-10-CM | POA: Diagnosis not present

## 2016-05-30 DIAGNOSIS — N950 Postmenopausal bleeding: Secondary | ICD-10-CM | POA: Diagnosis not present

## 2016-06-02 DIAGNOSIS — Z09 Encounter for follow-up examination after completed treatment for conditions other than malignant neoplasm: Secondary | ICD-10-CM | POA: Diagnosis not present

## 2016-06-02 DIAGNOSIS — H35371 Puckering of macula, right eye: Secondary | ICD-10-CM | POA: Diagnosis not present

## 2016-06-02 DIAGNOSIS — H33101 Unspecified retinoschisis, right eye: Secondary | ICD-10-CM | POA: Diagnosis not present

## 2016-07-06 DIAGNOSIS — M7061 Trochanteric bursitis, right hip: Secondary | ICD-10-CM | POA: Diagnosis not present

## 2016-07-19 ENCOUNTER — Other Ambulatory Visit: Payer: Self-pay | Admitting: Orthopedic Surgery

## 2016-07-19 DIAGNOSIS — M545 Low back pain: Principal | ICD-10-CM

## 2016-07-19 DIAGNOSIS — G8929 Other chronic pain: Secondary | ICD-10-CM

## 2016-08-01 DIAGNOSIS — M109 Gout, unspecified: Secondary | ICD-10-CM | POA: Diagnosis not present

## 2016-08-01 DIAGNOSIS — I129 Hypertensive chronic kidney disease with stage 1 through stage 4 chronic kidney disease, or unspecified chronic kidney disease: Secondary | ICD-10-CM | POA: Diagnosis not present

## 2016-08-01 DIAGNOSIS — F329 Major depressive disorder, single episode, unspecified: Secondary | ICD-10-CM | POA: Diagnosis not present

## 2016-08-01 DIAGNOSIS — N2581 Secondary hyperparathyroidism of renal origin: Secondary | ICD-10-CM | POA: Diagnosis not present

## 2016-08-01 DIAGNOSIS — E039 Hypothyroidism, unspecified: Secondary | ICD-10-CM | POA: Diagnosis not present

## 2016-08-01 DIAGNOSIS — N184 Chronic kidney disease, stage 4 (severe): Secondary | ICD-10-CM | POA: Diagnosis not present

## 2016-08-01 DIAGNOSIS — R339 Retention of urine, unspecified: Secondary | ICD-10-CM | POA: Diagnosis not present

## 2016-08-12 ENCOUNTER — Other Ambulatory Visit: Payer: Self-pay | Admitting: Internal Medicine

## 2016-08-12 DIAGNOSIS — G4452 New daily persistent headache (NDPH): Secondary | ICD-10-CM

## 2016-08-23 ENCOUNTER — Other Ambulatory Visit: Payer: Self-pay | Admitting: Internal Medicine

## 2016-08-23 ENCOUNTER — Ambulatory Visit
Admission: RE | Admit: 2016-08-23 | Discharge: 2016-08-23 | Disposition: A | Discharge status: Home / Self Care | Payer: Medicare Other | Source: Ambulatory Visit | Attending: Internal Medicine | Admitting: Internal Medicine

## 2016-08-23 DIAGNOSIS — R51 Headache: Secondary | ICD-10-CM | POA: Diagnosis not present

## 2016-08-23 DIAGNOSIS — G4452 New daily persistent headache (NDPH): Secondary | ICD-10-CM

## 2016-08-29 ENCOUNTER — Other Ambulatory Visit: Payer: Self-pay | Admitting: Orthopedic Surgery

## 2016-08-29 ENCOUNTER — Ambulatory Visit
Admission: RE | Admit: 2016-08-29 | Discharge: 2016-08-29 | Disposition: A | Discharge status: Home / Self Care | Payer: Medicare Other | Source: Ambulatory Visit | Attending: Orthopedic Surgery | Admitting: Orthopedic Surgery

## 2016-08-29 DIAGNOSIS — M545 Low back pain, unspecified: Secondary | ICD-10-CM

## 2016-08-29 DIAGNOSIS — G8929 Other chronic pain: Secondary | ICD-10-CM

## 2016-08-29 DIAGNOSIS — M47817 Spondylosis without myelopathy or radiculopathy, lumbosacral region: Secondary | ICD-10-CM | POA: Diagnosis not present

## 2016-08-29 MED ORDER — METHYLPREDNISOLONE ACETATE 40 MG/ML INJ SUSP (RADIOLOG: 120.0000 mg | Freq: Once | INTRAMUSCULAR | Status: DC

## 2016-08-29 MED ORDER — IOPAMIDOL (ISOVUE-M 200) INJECTION 41%: 1.0000 mL | Freq: Once | INTRAMUSCULAR | Status: DC

## 2016-08-29 NOTE — Discharge Instructions (Signed)

## 2016-08-30 DIAGNOSIS — M1711 Unilateral primary osteoarthritis, right knee: Secondary | ICD-10-CM | POA: Diagnosis not present

## 2016-09-01 DIAGNOSIS — N950 Postmenopausal bleeding: Secondary | ICD-10-CM | POA: Diagnosis not present

## 2016-09-06 DIAGNOSIS — M1711 Unilateral primary osteoarthritis, right knee: Secondary | ICD-10-CM | POA: Diagnosis not present

## 2016-10-03 DIAGNOSIS — H35371 Puckering of macula, right eye: Secondary | ICD-10-CM | POA: Diagnosis not present

## 2016-10-03 DIAGNOSIS — H35372 Puckering of macula, left eye: Secondary | ICD-10-CM | POA: Diagnosis not present

## 2016-10-03 DIAGNOSIS — H43812 Vitreous degeneration, left eye: Secondary | ICD-10-CM | POA: Diagnosis not present

## 2016-10-03 DIAGNOSIS — H33101 Unspecified retinoschisis, right eye: Secondary | ICD-10-CM | POA: Diagnosis not present

## 2016-10-05 DIAGNOSIS — M170 Bilateral primary osteoarthritis of knee: Secondary | ICD-10-CM | POA: Diagnosis not present

## 2016-10-27 DIAGNOSIS — M7071 Other bursitis of hip, right hip: Secondary | ICD-10-CM | POA: Diagnosis not present

## 2016-11-14 DIAGNOSIS — I129 Hypertensive chronic kidney disease with stage 1 through stage 4 chronic kidney disease, or unspecified chronic kidney disease: Secondary | ICD-10-CM | POA: Diagnosis not present

## 2016-11-14 DIAGNOSIS — Z880 Allergy status to penicillin: Secondary | ICD-10-CM | POA: Diagnosis not present

## 2016-11-14 DIAGNOSIS — M25561 Pain in right knee: Secondary | ICD-10-CM | POA: Diagnosis not present

## 2016-11-14 DIAGNOSIS — Z882 Allergy status to sulfonamides status: Secondary | ICD-10-CM | POA: Diagnosis not present

## 2016-11-14 DIAGNOSIS — E039 Hypothyroidism, unspecified: Secondary | ICD-10-CM | POA: Diagnosis not present

## 2016-11-14 DIAGNOSIS — M1711 Unilateral primary osteoarthritis, right knee: Secondary | ICD-10-CM | POA: Diagnosis not present

## 2016-11-14 DIAGNOSIS — Z886 Allergy status to analgesic agent status: Secondary | ICD-10-CM | POA: Diagnosis not present

## 2016-11-14 DIAGNOSIS — Z885 Allergy status to narcotic agent status: Secondary | ICD-10-CM | POA: Diagnosis not present

## 2016-11-14 DIAGNOSIS — Z79899 Other long term (current) drug therapy: Secondary | ICD-10-CM | POA: Diagnosis not present

## 2016-11-14 DIAGNOSIS — N184 Chronic kidney disease, stage 4 (severe): Secondary | ICD-10-CM | POA: Diagnosis not present

## 2016-12-14 DIAGNOSIS — M170 Bilateral primary osteoarthritis of knee: Secondary | ICD-10-CM | POA: Diagnosis not present

## 2016-12-29 DIAGNOSIS — R8290 Unspecified abnormal findings in urine: Secondary | ICD-10-CM | POA: Diagnosis not present

## 2016-12-29 DIAGNOSIS — R300 Dysuria: Secondary | ICD-10-CM | POA: Diagnosis not present

## 2017-01-17 ENCOUNTER — Encounter (HOSPITAL_COMMUNITY): Payer: Self-pay | Admitting: Emergency Medicine

## 2017-01-17 ENCOUNTER — Emergency Department (HOSPITAL_COMMUNITY)
Admission: EM | Admit: 2017-01-17 | Discharge: 2017-01-17 | Disposition: A | Discharge status: Home / Self Care | Payer: Medicare Other | Attending: Emergency Medicine | Admitting: Emergency Medicine

## 2017-01-17 ENCOUNTER — Emergency Department (HOSPITAL_COMMUNITY): Payer: Medicare Other

## 2017-01-17 DIAGNOSIS — I10 Essential (primary) hypertension: Secondary | ICD-10-CM | POA: Diagnosis not present

## 2017-01-17 DIAGNOSIS — Z79899 Other long term (current) drug therapy: Secondary | ICD-10-CM | POA: Insufficient documentation

## 2017-01-17 DIAGNOSIS — R300 Dysuria: Secondary | ICD-10-CM | POA: Diagnosis present

## 2017-01-17 DIAGNOSIS — R1012 Left upper quadrant pain: Secondary | ICD-10-CM | POA: Diagnosis not present

## 2017-01-17 DIAGNOSIS — N390 Urinary tract infection, site not specified: Secondary | ICD-10-CM | POA: Insufficient documentation

## 2017-01-17 DIAGNOSIS — N200 Calculus of kidney: Secondary | ICD-10-CM | POA: Diagnosis not present

## 2017-01-17 LAB — COMPREHENSIVE METABOLIC PANEL
ALK PHOS: 57 U/L (ref 38–126)
ALT: 10 U/L — ABNORMAL LOW (ref 14–54)
ANION GAP: 9 (ref 5–15)
AST: 15 U/L (ref 15–41)
Albumin: 3.2 g/dL — ABNORMAL LOW (ref 3.5–5.0)
BILIRUBIN TOTAL: 0.5 mg/dL (ref 0.3–1.2)
BUN: 34 mg/dL — AB (ref 6–20)
CALCIUM: 8.6 mg/dL — AB (ref 8.9–10.3)
CO2: 25 mmol/L (ref 22–32)
Chloride: 105 mmol/L (ref 101–111)
Creatinine, Ser: 2.27 mg/dL — ABNORMAL HIGH (ref 0.44–1.00)
GFR calc Af Amer: 24 mL/min — ABNORMAL LOW (ref >60)
GFR, EST NON AFRICAN AMERICAN: 20 mL/min — AB (ref >60)
Glucose, Bld: 123 mg/dL — ABNORMAL HIGH (ref 65–99)
POTASSIUM: 4.1 mmol/L (ref 3.5–5.1)
Sodium: 139 mmol/L (ref 135–145)
TOTAL PROTEIN: 6 g/dL — AB (ref 6.5–8.1)

## 2017-01-17 LAB — URINALYSIS, ROUTINE W REFLEX MICROSCOPIC
BILIRUBIN URINE: NEGATIVE
Glucose, UA: NEGATIVE mg/dL
Ketones, ur: NEGATIVE mg/dL
NITRITE: NEGATIVE
PH: 7 (ref 5.0–8.0)
Protein, ur: 100 mg/dL — AB
SPECIFIC GRAVITY, URINE: 1.01 (ref 1.005–1.030)

## 2017-01-17 LAB — CBC
HEMATOCRIT: 39.8 % (ref 36.0–46.0)
HEMOGLOBIN: 12.9 g/dL (ref 12.0–15.0)
MCH: 30.2 pg (ref 26.0–34.0)
MCHC: 32.4 g/dL (ref 30.0–36.0)
MCV: 93.2 fL (ref 78.0–100.0)
Platelets: 322 10*3/uL (ref 150–400)
RBC: 4.27 MIL/uL (ref 3.87–5.11)
RDW: 13 % (ref 11.5–15.5)
WBC: 14 10*3/uL — AB (ref 4.0–10.5)

## 2017-01-17 LAB — LIPASE, BLOOD: Lipase: 18 U/L (ref 11–51)

## 2017-01-17 MED ORDER — LIDOCAINE HCL (PF) 1 % IJ SOLN
INTRAMUSCULAR | Status: AC
  Administered 2017-01-17: 30 mL
  Filled 2017-01-17: qty 30

## 2017-01-17 MED ORDER — CEFTRIAXONE SODIUM 1 G IJ SOLR
1.0000 g | Freq: Once | INTRAMUSCULAR | Status: AC
  Administered 2017-01-17: 1 g via INTRAMUSCULAR
  Filled 2017-01-17: qty 10

## 2017-01-17 MED ORDER — ONDANSETRON 8 MG PO TBDP: 8.0000 mg | ORAL_TABLET | Freq: Three times a day (TID) | ORAL | 0 refills | Status: DC | PRN

## 2017-01-17 MED ORDER — CEPHALEXIN 500 MG PO CAPS: 500.0000 mg | ORAL_CAPSULE | Freq: Four times a day (QID) | ORAL | 0 refills | Status: DC

## 2017-01-17 NOTE — ED Notes (Signed)
E-signature not available. Pt vocalized understanding of discharge paperwork and denied any questions.

## 2017-01-17 NOTE — ED Notes (Signed)
E-signature pad unavailable. Pt voiced understanding of d/c paperwork and denies any questions.

## 2017-01-17 NOTE — ED Triage Notes (Addendum)
Per EMS. Pt reports LUQ abd pain, describes as pressure. Pt on opiates for chronic knee pain. Ambulatory with EMS Pt reports she also has emesis. LBM today. Pt has not taken home BP medication today.

## 2017-01-17 NOTE — ED Provider Notes (Signed)
[facility] DEPT Provider Note   CSN: [id number] Arrival date & time: 01/17/17  [place] [place]     History   Chief Complaint Chief Complaint  Patient presents with  . Abdominal Pain    HPI Carolyn Robertson is a 71 y.o. female.  71 year old female presents with two-day history of left-sided flank pain. She has had some dysuria as well as urinary frequency. Has a history of multiple UTIs. Give urine sample to her physician who ran a urinalysis and was negative for infection. Denies any fever or chills. Has had nonbilious emesis 1. A large stool today which did not relieve her symptoms. Denies any blood or dark color to it. No prior history of diverticular disease. No prior history of renal colic. Symptoms wax and wane and nothing makes them better or worse. No treatment use prior to arrival.      Past Medical History:  Diagnosis Date  . Hypertension   . Renal disorder   . Thyroid disease     There are no active problems to display for this patient.   Past Surgical History:  Procedure Laterality Date  . CHOLECYSTECTOMY    . DILATION AND CURETTAGE OF UTERUS    . KNEE SURGERY    . TONSILLECTOMY      OB History    No data available       Home Medications    Prior to Admission medications   Medication Sig Start Date End Date Taking? Authorizing Provider  calcitRIOL (ROCALTROL) 0.25 MCG capsule Take 0.25 mcg by mouth daily.    Historical Provider, MD  cholecalciferol (VITAMIN D) 1000 UNITS tablet Take 1,000 Units by mouth daily.    Historical Provider, MD  DULoxetine (CYMBALTA) 60 MG capsule Take 60 mg by mouth at bedtime.    Historical Provider, MD  esomeprazole (NEXIUM) 40 MG capsule Take 40 mg by mouth daily before breakfast.    Historical Provider, MD  estrogen, conjugated,-medroxyprogesterone (PREMPRO) 0.625-2.5 MG per tablet Take 1 tablet by mouth daily.    Historical Provider, MD  febuxostat (ULORIC) 40 MG tablet Take 80 mg by mouth at bedtime.    Historical Provider,  MD  furosemide (LASIX) 40 MG tablet Take 40 mg by mouth 2 (two) times daily.    Historical Provider, MD  levothyroxine (SYNTHROID, LEVOTHROID) 125 MCG tablet Take 125 mcg by mouth daily before breakfast.    Historical Provider, MD  oxyCODONE-acetaminophen (PERCOCET/ROXICET) 5-325 MG tablet Take 1 tablet by mouth every 4 (four) hours as needed for severe pain. 02/25/16   Julianne Rice, MD  zolpidem (AMBIEN) 10 MG tablet Take 10 mg by mouth at bedtime.    Historical Provider, MD    Family History History reviewed. No pertinent family history.  Social History Social History  Substance Use Topics  . Smoking status: Never Smoker  . Smokeless tobacco: Not on file  . Alcohol use No     Allergies   Penicillins; Sulfa antibiotics; Codeine; Hydrocodone-acetaminophen; and Naproxen sodium   Review of Systems Review of Systems  All other systems reviewed and are negative.    Physical Exam Updated Vital Signs BP (!) 193/90   Pulse 78   Temp 97.5 F (36.4 C) (Oral)   Resp 18   Ht 5\' 4"  (1.626 m)   Wt 63.5 kg   SpO2 100%   BMI 24.03 kg/m   Physical Exam  Constitutional: She is oriented to person, place, and time. She appears well-developed and well-nourished.  Non-toxic appearance. No distress.  HENT:  Head: Normocephalic and atraumatic.  Eyes: Conjunctivae, EOM and lids are normal. Pupils are equal, round, and reactive to light.  Neck: Normal range of motion. Neck supple. No tracheal deviation present. No thyroid mass present.  Cardiovascular: Normal rate, regular rhythm and normal heart sounds.  Exam reveals no gallop.   No murmur heard. Pulmonary/Chest: Effort normal and breath sounds normal. No stridor. No respiratory distress. She has no decreased breath sounds. She has no wheezes. She has no rhonchi. She has no rales.  Abdominal: Soft. Normal appearance and bowel sounds are normal. She exhibits no distension. There is no tenderness. There is no rebound and no CVA tenderness.     Musculoskeletal: Normal range of motion. She exhibits no edema or tenderness.  Neurological: She is alert and oriented to person, place, and time. She has normal strength. No cranial nerve deficit or sensory deficit. GCS eye subscore is 4. GCS verbal subscore is 5. GCS motor subscore is 6.  Skin: Skin is warm and dry. No abrasion and no rash noted.  Psychiatric: She has a normal mood and affect. Her speech is normal and behavior is normal.  Nursing note and vitals reviewed.    ED Treatments / Results  Labs (all labs ordered are listed, but only abnormal results are displayed) Labs Reviewed  COMPREHENSIVE METABOLIC PANEL - Abnormal; Notable for the following:       Result Value   Glucose, Bld 123 (*)    BUN 34 (*)    Creatinine, Ser 2.27 (*)    Calcium 8.6 (*)    Total Protein 6.0 (*)    Albumin 3.2 (*)    ALT 10 (*)    GFR calc non Af Amer 20 (*)    GFR calc Af Amer 24 (*)    All other components within normal limits  CBC - Abnormal; Notable for the following:    WBC 14.0 (*)    All other components within normal limits  URINALYSIS, ROUTINE W REFLEX MICROSCOPIC - Abnormal; Notable for the following:    Hgb urine dipstick MODERATE (*)    Protein, ur 100 (*)    Leukocytes, UA TRACE (*)    Bacteria, UA RARE (*)    Squamous Epithelial / LPF 0-5 (*)    All other components within normal limits  LIPASE, BLOOD    EKG  EKG Interpretation None       Radiology No results found.  Procedures Procedures (including critical care time)  Medications Ordered in ED Medications - No data to display   Initial Impression / Assessment and Plan / ED Course  I have reviewed the triage vital signs and the nursing notes.  Pertinent labs & imaging results that were available during my care of the patient were reviewed by me and considered in my medical decision making (see chart for details).     CT results of her abdomen pelvis show possible infection as well as kidney stone.  Patient given IM dose of Rocephin here and will give prescription for Keflex. Return precautions given  Final Clinical Impressions(s) / ED Diagnoses   Final diagnoses:  None    New Prescriptions New Prescriptions   No medications on file     Lacretia Leigh, MD 01/17/17 2124

## 2017-01-19 DIAGNOSIS — D6489 Other specified anemias: Secondary | ICD-10-CM | POA: Diagnosis not present

## 2017-01-20 DIAGNOSIS — R197 Diarrhea, unspecified: Secondary | ICD-10-CM | POA: Diagnosis not present

## 2017-02-08 DIAGNOSIS — E538 Deficiency of other specified B group vitamins: Secondary | ICD-10-CM | POA: Diagnosis not present

## 2017-02-08 DIAGNOSIS — R8290 Unspecified abnormal findings in urine: Secondary | ICD-10-CM | POA: Diagnosis not present

## 2017-02-08 DIAGNOSIS — E038 Other specified hypothyroidism: Secondary | ICD-10-CM | POA: Diagnosis not present

## 2017-02-08 DIAGNOSIS — R358 Other polyuria: Secondary | ICD-10-CM | POA: Diagnosis not present

## 2017-02-08 DIAGNOSIS — I10 Essential (primary) hypertension: Secondary | ICD-10-CM | POA: Diagnosis not present

## 2017-02-17 DIAGNOSIS — E038 Other specified hypothyroidism: Secondary | ICD-10-CM | POA: Diagnosis not present

## 2017-02-17 DIAGNOSIS — G4700 Insomnia, unspecified: Secondary | ICD-10-CM | POA: Diagnosis not present

## 2017-02-17 DIAGNOSIS — Z1389 Encounter for screening for other disorder: Secondary | ICD-10-CM | POA: Diagnosis not present

## 2017-02-17 DIAGNOSIS — I10 Essential (primary) hypertension: Secondary | ICD-10-CM | POA: Diagnosis not present

## 2017-02-17 DIAGNOSIS — M1990 Unspecified osteoarthritis, unspecified site: Secondary | ICD-10-CM | POA: Diagnosis not present

## 2017-02-17 DIAGNOSIS — R740 Nonspecific elevation of levels of transaminase and lactic acid dehydrogenase [LDH]: Secondary | ICD-10-CM | POA: Diagnosis not present

## 2017-02-17 DIAGNOSIS — F329 Major depressive disorder, single episode, unspecified: Secondary | ICD-10-CM | POA: Diagnosis not present

## 2017-02-17 DIAGNOSIS — Z0000 Encounter for general adult medical examination without abnormal findings: Secondary | ICD-10-CM | POA: Diagnosis not present

## 2017-02-17 DIAGNOSIS — I129 Hypertensive chronic kidney disease with stage 1 through stage 4 chronic kidney disease, or unspecified chronic kidney disease: Secondary | ICD-10-CM | POA: Diagnosis not present

## 2017-02-17 DIAGNOSIS — J45909 Unspecified asthma, uncomplicated: Secondary | ICD-10-CM | POA: Diagnosis not present

## 2017-02-17 DIAGNOSIS — Z6825 Body mass index (BMI) 25.0-25.9, adult: Secondary | ICD-10-CM | POA: Diagnosis not present

## 2017-02-17 DIAGNOSIS — E538 Deficiency of other specified B group vitamins: Secondary | ICD-10-CM | POA: Diagnosis not present

## 2017-02-23 DIAGNOSIS — N184 Chronic kidney disease, stage 4 (severe): Secondary | ICD-10-CM | POA: Diagnosis not present

## 2017-02-23 DIAGNOSIS — N390 Urinary tract infection, site not specified: Secondary | ICD-10-CM | POA: Diagnosis not present

## 2017-02-23 DIAGNOSIS — E039 Hypothyroidism, unspecified: Secondary | ICD-10-CM | POA: Diagnosis not present

## 2017-02-23 DIAGNOSIS — E8770 Fluid overload, unspecified: Secondary | ICD-10-CM | POA: Diagnosis not present

## 2017-02-23 DIAGNOSIS — N2581 Secondary hyperparathyroidism of renal origin: Secondary | ICD-10-CM | POA: Diagnosis not present

## 2017-02-23 DIAGNOSIS — M109 Gout, unspecified: Secondary | ICD-10-CM | POA: Diagnosis not present

## 2017-02-23 DIAGNOSIS — N200 Calculus of kidney: Secondary | ICD-10-CM | POA: Diagnosis not present

## 2017-02-23 DIAGNOSIS — I129 Hypertensive chronic kidney disease with stage 1 through stage 4 chronic kidney disease, or unspecified chronic kidney disease: Secondary | ICD-10-CM | POA: Diagnosis not present

## 2017-03-08 DIAGNOSIS — N200 Calculus of kidney: Secondary | ICD-10-CM | POA: Diagnosis not present

## 2017-03-08 DIAGNOSIS — I129 Hypertensive chronic kidney disease with stage 1 through stage 4 chronic kidney disease, or unspecified chronic kidney disease: Secondary | ICD-10-CM | POA: Diagnosis not present

## 2017-03-08 DIAGNOSIS — F329 Major depressive disorder, single episode, unspecified: Secondary | ICD-10-CM | POA: Diagnosis not present

## 2017-03-08 DIAGNOSIS — E039 Hypothyroidism, unspecified: Secondary | ICD-10-CM | POA: Diagnosis not present

## 2017-03-08 DIAGNOSIS — N2581 Secondary hyperparathyroidism of renal origin: Secondary | ICD-10-CM | POA: Diagnosis not present

## 2017-03-08 DIAGNOSIS — N184 Chronic kidney disease, stage 4 (severe): Secondary | ICD-10-CM | POA: Diagnosis not present

## 2017-03-08 DIAGNOSIS — M109 Gout, unspecified: Secondary | ICD-10-CM | POA: Diagnosis not present

## 2017-03-08 DIAGNOSIS — E8770 Fluid overload, unspecified: Secondary | ICD-10-CM | POA: Diagnosis not present

## 2017-05-18 DIAGNOSIS — F329 Major depressive disorder, single episode, unspecified: Secondary | ICD-10-CM | POA: Diagnosis not present

## 2017-06-12 DIAGNOSIS — N950 Postmenopausal bleeding: Secondary | ICD-10-CM | POA: Diagnosis not present

## 2017-06-12 DIAGNOSIS — Z79890 Hormone replacement therapy (postmenopausal): Secondary | ICD-10-CM | POA: Diagnosis not present

## 2017-06-12 DIAGNOSIS — N3281 Overactive bladder: Secondary | ICD-10-CM | POA: Diagnosis not present

## 2017-07-18 DIAGNOSIS — N950 Postmenopausal bleeding: Secondary | ICD-10-CM | POA: Diagnosis not present

## 2017-07-18 DIAGNOSIS — R1031 Right lower quadrant pain: Secondary | ICD-10-CM | POA: Diagnosis not present

## 2017-07-26 DIAGNOSIS — M7061 Trochanteric bursitis, right hip: Secondary | ICD-10-CM | POA: Diagnosis not present

## 2017-07-26 DIAGNOSIS — M7062 Trochanteric bursitis, left hip: Secondary | ICD-10-CM | POA: Diagnosis not present

## 2017-08-11 DIAGNOSIS — G43909 Migraine, unspecified, not intractable, without status migrainosus: Secondary | ICD-10-CM | POA: Diagnosis not present

## 2017-08-11 DIAGNOSIS — Z6827 Body mass index (BMI) 27.0-27.9, adult: Secondary | ICD-10-CM | POA: Diagnosis not present

## 2017-08-11 DIAGNOSIS — Z23 Encounter for immunization: Secondary | ICD-10-CM | POA: Diagnosis not present

## 2017-08-11 DIAGNOSIS — I129 Hypertensive chronic kidney disease with stage 1 through stage 4 chronic kidney disease, or unspecified chronic kidney disease: Secondary | ICD-10-CM | POA: Diagnosis not present

## 2017-08-11 DIAGNOSIS — N184 Chronic kidney disease, stage 4 (severe): Secondary | ICD-10-CM | POA: Diagnosis not present

## 2017-08-11 DIAGNOSIS — D6489 Other specified anemias: Secondary | ICD-10-CM | POA: Diagnosis not present

## 2017-08-11 DIAGNOSIS — G9529 Other cord compression: Secondary | ICD-10-CM | POA: Diagnosis not present

## 2017-08-11 DIAGNOSIS — J45998 Other asthma: Secondary | ICD-10-CM | POA: Diagnosis not present

## 2017-08-11 DIAGNOSIS — E538 Deficiency of other specified B group vitamins: Secondary | ICD-10-CM | POA: Diagnosis not present

## 2017-08-11 DIAGNOSIS — G250 Essential tremor: Secondary | ICD-10-CM | POA: Diagnosis not present

## 2017-08-11 DIAGNOSIS — R32 Unspecified urinary incontinence: Secondary | ICD-10-CM | POA: Diagnosis not present

## 2017-08-11 DIAGNOSIS — E038 Other specified hypothyroidism: Secondary | ICD-10-CM | POA: Diagnosis not present

## 2017-09-04 DIAGNOSIS — E538 Deficiency of other specified B group vitamins: Secondary | ICD-10-CM | POA: Diagnosis not present

## 2017-09-04 DIAGNOSIS — D649 Anemia, unspecified: Secondary | ICD-10-CM | POA: Diagnosis not present

## 2017-09-11 DIAGNOSIS — E538 Deficiency of other specified B group vitamins: Secondary | ICD-10-CM | POA: Diagnosis not present

## 2017-09-18 DIAGNOSIS — M170 Bilateral primary osteoarthritis of knee: Secondary | ICD-10-CM | POA: Diagnosis not present

## 2017-10-02 DIAGNOSIS — H35371 Puckering of macula, right eye: Secondary | ICD-10-CM | POA: Diagnosis not present

## 2017-10-02 DIAGNOSIS — H33101 Unspecified retinoschisis, right eye: Secondary | ICD-10-CM | POA: Diagnosis not present

## 2017-10-02 DIAGNOSIS — H43812 Vitreous degeneration, left eye: Secondary | ICD-10-CM | POA: Diagnosis not present

## 2017-10-02 DIAGNOSIS — H35372 Puckering of macula, left eye: Secondary | ICD-10-CM | POA: Diagnosis not present

## 2017-10-03 DIAGNOSIS — M170 Bilateral primary osteoarthritis of knee: Secondary | ICD-10-CM | POA: Diagnosis not present

## 2017-10-05 DIAGNOSIS — N390 Urinary tract infection, site not specified: Secondary | ICD-10-CM | POA: Diagnosis not present

## 2017-10-05 DIAGNOSIS — N2581 Secondary hyperparathyroidism of renal origin: Secondary | ICD-10-CM | POA: Diagnosis not present

## 2017-10-05 DIAGNOSIS — F329 Major depressive disorder, single episode, unspecified: Secondary | ICD-10-CM | POA: Diagnosis not present

## 2017-10-05 DIAGNOSIS — I129 Hypertensive chronic kidney disease with stage 1 through stage 4 chronic kidney disease, or unspecified chronic kidney disease: Secondary | ICD-10-CM | POA: Diagnosis not present

## 2017-10-05 DIAGNOSIS — M109 Gout, unspecified: Secondary | ICD-10-CM | POA: Diagnosis not present

## 2017-10-05 DIAGNOSIS — E8770 Fluid overload, unspecified: Secondary | ICD-10-CM | POA: Diagnosis not present

## 2017-10-05 DIAGNOSIS — N184 Chronic kidney disease, stage 4 (severe): Secondary | ICD-10-CM | POA: Diagnosis not present

## 2017-10-30 ENCOUNTER — Other Ambulatory Visit: Payer: Self-pay | Admitting: Internal Medicine

## 2017-10-30 DIAGNOSIS — Z1239 Encounter for other screening for malignant neoplasm of breast: Secondary | ICD-10-CM

## 2017-11-02 ENCOUNTER — Ambulatory Visit
Admission: RE | Admit: 2017-11-02 | Discharge: 2017-11-02 | Disposition: A | Discharge status: Home / Self Care | Payer: Medicare Other | Source: Ambulatory Visit | Attending: Internal Medicine | Admitting: Internal Medicine

## 2017-11-02 DIAGNOSIS — Z1231 Encounter for screening mammogram for malignant neoplasm of breast: Secondary | ICD-10-CM | POA: Diagnosis not present

## 2017-11-02 DIAGNOSIS — Z1239 Encounter for other screening for malignant neoplasm of breast: Secondary | ICD-10-CM

## 2017-11-08 DIAGNOSIS — M170 Bilateral primary osteoarthritis of knee: Secondary | ICD-10-CM | POA: Diagnosis not present

## 2017-11-09 ENCOUNTER — Other Ambulatory Visit: Payer: Self-pay | Admitting: Orthopedic Surgery

## 2017-11-09 ENCOUNTER — Ambulatory Visit
Admission: RE | Admit: 2017-11-09 | Discharge: 2017-11-09 | Disposition: A | Discharge status: Home / Self Care | Payer: Medicare Other | Source: Ambulatory Visit | Attending: Orthopedic Surgery | Admitting: Orthopedic Surgery

## 2017-11-09 DIAGNOSIS — M25551 Pain in right hip: Secondary | ICD-10-CM

## 2017-11-09 DIAGNOSIS — M1611 Unilateral primary osteoarthritis, right hip: Secondary | ICD-10-CM | POA: Diagnosis not present

## 2017-11-09 MED ORDER — METHYLPREDNISOLONE ACETATE 40 MG/ML INJ SUSP (RADIOLOG
120.0000 mg | Freq: Once | INTRAMUSCULAR | Status: AC
  Administered 2017-11-09: 120 mg via INTRA_ARTICULAR

## 2017-11-09 MED ORDER — IOPAMIDOL (ISOVUE-M 200) INJECTION 41%
1.0000 mL | Freq: Once | INTRAMUSCULAR | Status: AC
  Administered 2017-11-09: 1 mL via INTRA_ARTICULAR

## 2017-12-28 DIAGNOSIS — M170 Bilateral primary osteoarthritis of knee: Secondary | ICD-10-CM | POA: Diagnosis not present

## 2018-01-30 DIAGNOSIS — H35373 Puckering of macula, bilateral: Secondary | ICD-10-CM | POA: Diagnosis not present

## 2018-01-30 DIAGNOSIS — Z961 Presence of intraocular lens: Secondary | ICD-10-CM | POA: Diagnosis not present

## 2018-02-05 DIAGNOSIS — M170 Bilateral primary osteoarthritis of knee: Secondary | ICD-10-CM | POA: Diagnosis not present

## 2018-02-15 DIAGNOSIS — E538 Deficiency of other specified B group vitamins: Secondary | ICD-10-CM | POA: Diagnosis not present

## 2018-02-15 DIAGNOSIS — I10 Essential (primary) hypertension: Secondary | ICD-10-CM | POA: Diagnosis not present

## 2018-02-15 DIAGNOSIS — Z6828 Body mass index (BMI) 28.0-28.9, adult: Secondary | ICD-10-CM | POA: Diagnosis not present

## 2018-02-15 DIAGNOSIS — S81811A Laceration without foreign body, right lower leg, initial encounter: Secondary | ICD-10-CM | POA: Diagnosis not present

## 2018-02-15 DIAGNOSIS — R82998 Other abnormal findings in urine: Secondary | ICD-10-CM | POA: Diagnosis not present

## 2018-02-15 DIAGNOSIS — E038 Other specified hypothyroidism: Secondary | ICD-10-CM | POA: Diagnosis not present

## 2018-02-19 DIAGNOSIS — E038 Other specified hypothyroidism: Secondary | ICD-10-CM | POA: Diagnosis not present

## 2018-02-19 DIAGNOSIS — I872 Venous insufficiency (chronic) (peripheral): Secondary | ICD-10-CM | POA: Diagnosis not present

## 2018-02-19 DIAGNOSIS — S81801D Unspecified open wound, right lower leg, subsequent encounter: Secondary | ICD-10-CM | POA: Diagnosis not present

## 2018-02-19 DIAGNOSIS — N184 Chronic kidney disease, stage 4 (severe): Secondary | ICD-10-CM | POA: Diagnosis not present

## 2018-02-19 DIAGNOSIS — L97912 Non-pressure chronic ulcer of unspecified part of right lower leg with fat layer exposed: Secondary | ICD-10-CM | POA: Diagnosis not present

## 2018-02-19 DIAGNOSIS — D6489 Other specified anemias: Secondary | ICD-10-CM | POA: Diagnosis not present

## 2018-02-19 DIAGNOSIS — I129 Hypertensive chronic kidney disease with stage 1 through stage 4 chronic kidney disease, or unspecified chronic kidney disease: Secondary | ICD-10-CM | POA: Diagnosis not present

## 2018-02-19 DIAGNOSIS — J45909 Unspecified asthma, uncomplicated: Secondary | ICD-10-CM | POA: Diagnosis not present

## 2018-02-19 DIAGNOSIS — S81801A Unspecified open wound, right lower leg, initial encounter: Secondary | ICD-10-CM | POA: Diagnosis not present

## 2018-02-19 DIAGNOSIS — I10 Essential (primary) hypertension: Secondary | ICD-10-CM | POA: Diagnosis not present

## 2018-02-19 DIAGNOSIS — G4700 Insomnia, unspecified: Secondary | ICD-10-CM | POA: Diagnosis not present

## 2018-02-19 DIAGNOSIS — Z0000 Encounter for general adult medical examination without abnormal findings: Secondary | ICD-10-CM | POA: Diagnosis not present

## 2018-02-19 DIAGNOSIS — L03115 Cellulitis of right lower limb: Secondary | ICD-10-CM | POA: Diagnosis not present

## 2018-02-19 DIAGNOSIS — E538 Deficiency of other specified B group vitamins: Secondary | ICD-10-CM | POA: Diagnosis not present

## 2018-02-19 DIAGNOSIS — S81811A Laceration without foreign body, right lower leg, initial encounter: Secondary | ICD-10-CM | POA: Diagnosis not present

## 2018-02-19 DIAGNOSIS — Z1389 Encounter for screening for other disorder: Secondary | ICD-10-CM | POA: Diagnosis not present

## 2018-02-19 DIAGNOSIS — M1990 Unspecified osteoarthritis, unspecified site: Secondary | ICD-10-CM | POA: Diagnosis not present

## 2018-02-19 DIAGNOSIS — Z79899 Other long term (current) drug therapy: Secondary | ICD-10-CM | POA: Diagnosis not present

## 2018-02-27 DIAGNOSIS — S81801A Unspecified open wound, right lower leg, initial encounter: Secondary | ICD-10-CM | POA: Diagnosis not present

## 2018-02-27 DIAGNOSIS — N184 Chronic kidney disease, stage 4 (severe): Secondary | ICD-10-CM | POA: Diagnosis not present

## 2018-02-27 DIAGNOSIS — L97912 Non-pressure chronic ulcer of unspecified part of right lower leg with fat layer exposed: Secondary | ICD-10-CM | POA: Diagnosis not present

## 2018-02-27 DIAGNOSIS — S81801D Unspecified open wound, right lower leg, subsequent encounter: Secondary | ICD-10-CM | POA: Diagnosis not present

## 2018-02-27 DIAGNOSIS — I872 Venous insufficiency (chronic) (peripheral): Secondary | ICD-10-CM | POA: Diagnosis not present

## 2018-02-27 DIAGNOSIS — L97911 Non-pressure chronic ulcer of unspecified part of right lower leg limited to breakdown of skin: Secondary | ICD-10-CM | POA: Diagnosis not present

## 2018-02-27 DIAGNOSIS — L03115 Cellulitis of right lower limb: Secondary | ICD-10-CM | POA: Diagnosis not present

## 2018-03-06 DIAGNOSIS — L97912 Non-pressure chronic ulcer of unspecified part of right lower leg with fat layer exposed: Secondary | ICD-10-CM | POA: Diagnosis not present

## 2018-03-06 DIAGNOSIS — I872 Venous insufficiency (chronic) (peripheral): Secondary | ICD-10-CM | POA: Diagnosis not present

## 2018-03-06 DIAGNOSIS — L97911 Non-pressure chronic ulcer of unspecified part of right lower leg limited to breakdown of skin: Secondary | ICD-10-CM | POA: Diagnosis not present

## 2018-03-06 DIAGNOSIS — N184 Chronic kidney disease, stage 4 (severe): Secondary | ICD-10-CM | POA: Diagnosis not present

## 2018-03-06 DIAGNOSIS — L03115 Cellulitis of right lower limb: Secondary | ICD-10-CM | POA: Diagnosis not present

## 2018-03-06 DIAGNOSIS — S81801D Unspecified open wound, right lower leg, subsequent encounter: Secondary | ICD-10-CM | POA: Diagnosis not present

## 2018-03-06 DIAGNOSIS — S81801A Unspecified open wound, right lower leg, initial encounter: Secondary | ICD-10-CM | POA: Diagnosis not present

## 2018-03-09 DIAGNOSIS — M5441 Lumbago with sciatica, right side: Secondary | ICD-10-CM | POA: Diagnosis not present

## 2018-03-13 DIAGNOSIS — I872 Venous insufficiency (chronic) (peripheral): Secondary | ICD-10-CM | POA: Diagnosis not present

## 2018-03-13 DIAGNOSIS — S81801A Unspecified open wound, right lower leg, initial encounter: Secondary | ICD-10-CM | POA: Diagnosis not present

## 2018-03-13 DIAGNOSIS — L97911 Non-pressure chronic ulcer of unspecified part of right lower leg limited to breakdown of skin: Secondary | ICD-10-CM | POA: Diagnosis not present

## 2018-03-13 DIAGNOSIS — S81801D Unspecified open wound, right lower leg, subsequent encounter: Secondary | ICD-10-CM | POA: Diagnosis not present

## 2018-03-13 DIAGNOSIS — L97912 Non-pressure chronic ulcer of unspecified part of right lower leg with fat layer exposed: Secondary | ICD-10-CM | POA: Diagnosis not present

## 2018-03-13 DIAGNOSIS — N184 Chronic kidney disease, stage 4 (severe): Secondary | ICD-10-CM | POA: Diagnosis not present

## 2018-03-13 DIAGNOSIS — L03115 Cellulitis of right lower limb: Secondary | ICD-10-CM | POA: Diagnosis not present

## 2018-03-15 DIAGNOSIS — M545 Low back pain: Secondary | ICD-10-CM | POA: Diagnosis not present

## 2018-03-19 DIAGNOSIS — M545 Low back pain: Secondary | ICD-10-CM | POA: Diagnosis not present

## 2018-03-20 DIAGNOSIS — L97911 Non-pressure chronic ulcer of unspecified part of right lower leg limited to breakdown of skin: Secondary | ICD-10-CM | POA: Diagnosis not present

## 2018-03-20 DIAGNOSIS — I872 Venous insufficiency (chronic) (peripheral): Secondary | ICD-10-CM | POA: Diagnosis not present

## 2018-03-22 DIAGNOSIS — M5126 Other intervertebral disc displacement, lumbar region: Secondary | ICD-10-CM | POA: Diagnosis not present

## 2018-03-22 DIAGNOSIS — M4316 Spondylolisthesis, lumbar region: Secondary | ICD-10-CM | POA: Diagnosis not present

## 2018-03-22 DIAGNOSIS — M5416 Radiculopathy, lumbar region: Secondary | ICD-10-CM | POA: Diagnosis not present

## 2018-03-22 DIAGNOSIS — Z6827 Body mass index (BMI) 27.0-27.9, adult: Secondary | ICD-10-CM | POA: Diagnosis not present

## 2018-03-23 ENCOUNTER — Other Ambulatory Visit: Payer: Self-pay | Admitting: Neurosurgery

## 2018-03-27 DIAGNOSIS — L97911 Non-pressure chronic ulcer of unspecified part of right lower leg limited to breakdown of skin: Secondary | ICD-10-CM | POA: Diagnosis not present

## 2018-03-27 DIAGNOSIS — I872 Venous insufficiency (chronic) (peripheral): Secondary | ICD-10-CM | POA: Diagnosis not present

## 2018-03-28 ENCOUNTER — Other Ambulatory Visit: Payer: Self-pay | Admitting: Neurosurgery

## 2018-04-03 DIAGNOSIS — L97911 Non-pressure chronic ulcer of unspecified part of right lower leg limited to breakdown of skin: Secondary | ICD-10-CM | POA: Diagnosis not present

## 2018-04-03 DIAGNOSIS — S81801A Unspecified open wound, right lower leg, initial encounter: Secondary | ICD-10-CM | POA: Diagnosis not present

## 2018-04-03 DIAGNOSIS — S81801D Unspecified open wound, right lower leg, subsequent encounter: Secondary | ICD-10-CM | POA: Diagnosis not present

## 2018-04-03 DIAGNOSIS — N184 Chronic kidney disease, stage 4 (severe): Secondary | ICD-10-CM | POA: Diagnosis not present

## 2018-04-03 DIAGNOSIS — I872 Venous insufficiency (chronic) (peripheral): Secondary | ICD-10-CM | POA: Diagnosis not present

## 2018-04-03 DIAGNOSIS — L03115 Cellulitis of right lower limb: Secondary | ICD-10-CM | POA: Diagnosis not present

## 2018-04-03 DIAGNOSIS — L97912 Non-pressure chronic ulcer of unspecified part of right lower leg with fat layer exposed: Secondary | ICD-10-CM | POA: Diagnosis not present

## 2018-04-05 NOTE — Pre-Procedure Instructions (Signed)
Carolyn Robertson  04/05/2018      [facility], [address] [place] [address] Phone: [phone] Fax: [phone]    Your procedure is scheduled on June 27  Report to [facility] at 0830 A.M.  Call this number if you have problems the morning of surgery:  [phone]   Remember:  NOTHING TO EAT OR DRINK AFTER MIDNIGHT    Take these medicines the morning of surgery with A SIP OF WATER  baclofen (LIORESAL)  estrogen, conjugated,-medroxyprogesterone (PREMPRO) LYRICA  oxyCODONE-acetaminophen (PERCOCET/ROXICET) pantoprazole (PROTONIX)  SYNTHROID   7 days prior to surgery STOP taking any Aspirin(unless otherwise instructed by your surgeon), Aleve, Naproxen, Ibuprofen, Motrin, Advil, Goody's, BC's, all herbal medications, fish oil, and all vitamins  Per Dr. Louie Boston stop telmisartan (MICARDIS) 2 days prior to surgery and restart 2 days after surgery    Do not wear jewelry, make-up or nail polish.  Do not wear lotions, powders, or perfumes, or deodorant.  Do not shave 48 hours prior to surgery.    Do not bring valuables to the hospital.  [facility] is not responsible for any belongings or valuables.  Contacts, dentures or bridgework may not be worn into surgery.  Leave your suitcase in the car.  After surgery it may be brought to your room.  For patients admitted to the hospital, discharge time will be determined by your treatment team.  Patients discharged the day of surgery will not be allowed to drive home.    Special instructions:   [place]- Preparing For Surgery  Before surgery, you can play an important role. Because skin is not sterile, your skin needs to be as free of germs as possible. You can reduce the number of germs on your skin by washing with CHG (chlorahexidine gluconate) Soap before surgery.  CHG is an antiseptic  cleaner which kills germs and bonds with the skin to continue killing germs even after washing.    Oral Hygiene is also important to reduce your risk of infection.  Remember - BRUSH YOUR TEETH THE MORNING OF SURGERY WITH YOUR REGULAR TOOTHPASTE  Please do not use if you have an allergy to CHG or antibacterial soaps. If your skin becomes reddened/irritated stop using the CHG.  Do not shave (including legs and underarms) for at least 48 hours prior to first CHG shower. It is OK to shave your face.  Please follow these instructions carefully.   1. Shower the NIGHT BEFORE SURGERY and the MORNING OF SURGERY with CHG.   2. If you chose to wash your hair, wash your hair first as usual with your normal shampoo.  3. After you shampoo, rinse your hair and body thoroughly to remove the shampoo.  4. Use CHG as you would any other liquid soap. You can apply CHG directly to the skin and wash gently with a scrungie or a clean washcloth.   5. Apply the CHG Soap to your body ONLY FROM THE NECK DOWN.  Do not use on open wounds or open sores. Avoid contact with your eyes, ears, mouth and genitals (private parts). Wash Face and genitals (private parts)  with your normal soap.  6. Wash thoroughly, paying special attention to the area where your surgery will be performed.  7. Thoroughly rinse your body with warm water from the neck down.  8. DO NOT shower/wash with your normal soap after  using and rinsing off the CHG Soap.  9. Pat yourself dry with a CLEAN TOWEL.  10. Wear CLEAN PAJAMAS to bed the night before surgery, wear comfortable clothes the morning of surgery  11. Place CLEAN SHEETS on your bed the night of your first shower and DO NOT SLEEP WITH PETS.    Day of Surgery:  Do not apply any deodorants/lotions.  Please wear clean clothes to the hospital/surgery center.   Remember to brush your teeth WITH YOUR REGULAR TOOTHPASTE.   Please read over the following fact sheets that you were  given.

## 2018-04-06 ENCOUNTER — Encounter (HOSPITAL_COMMUNITY)
Admission: RE | Admit: 2018-04-06 | Discharge: 2018-04-06 | Disposition: A | Discharge status: Home / Self Care | Payer: Medicare Other | Source: Ambulatory Visit | Attending: Neurosurgery | Admitting: Neurosurgery

## 2018-04-06 ENCOUNTER — Other Ambulatory Visit: Payer: Self-pay

## 2018-04-06 ENCOUNTER — Encounter (HOSPITAL_COMMUNITY): Payer: Self-pay

## 2018-04-06 DIAGNOSIS — M5126 Other intervertebral disc displacement, lumbar region: Secondary | ICD-10-CM | POA: Insufficient documentation

## 2018-04-06 DIAGNOSIS — Z01818 Encounter for other preprocedural examination: Secondary | ICD-10-CM | POA: Insufficient documentation

## 2018-04-06 DIAGNOSIS — I491 Atrial premature depolarization: Secondary | ICD-10-CM | POA: Diagnosis not present

## 2018-04-06 DIAGNOSIS — N184 Chronic kidney disease, stage 4 (severe): Secondary | ICD-10-CM | POA: Diagnosis not present

## 2018-04-06 DIAGNOSIS — L239 Allergic contact dermatitis, unspecified cause: Secondary | ICD-10-CM | POA: Diagnosis not present

## 2018-04-06 DIAGNOSIS — L97911 Non-pressure chronic ulcer of unspecified part of right lower leg limited to breakdown of skin: Secondary | ICD-10-CM | POA: Diagnosis not present

## 2018-04-06 DIAGNOSIS — I872 Venous insufficiency (chronic) (peripheral): Secondary | ICD-10-CM | POA: Diagnosis not present

## 2018-04-06 HISTORY — DX: Personal history of urinary calculi: Z87.442

## 2018-04-06 HISTORY — DX: Gastro-esophageal reflux disease without esophagitis: K21.9

## 2018-04-06 HISTORY — DX: Unspecified urinary incontinence: R32

## 2018-04-06 HISTORY — DX: Unspecified asthma, uncomplicated: J45.909

## 2018-04-06 HISTORY — DX: Hypothyroidism, unspecified: E03.9

## 2018-04-06 HISTORY — DX: Unspecified osteoarthritis, unspecified site: M19.90

## 2018-04-06 HISTORY — DX: Anxiety disorder, unspecified: F41.9

## 2018-04-06 LAB — CBC
HCT: 41.2 % (ref 36.0–46.0)
HEMOGLOBIN: 12.6 g/dL (ref 12.0–15.0)
MCH: 29.1 pg (ref 26.0–34.0)
MCHC: 30.6 g/dL (ref 30.0–36.0)
MCV: 95.2 fL (ref 78.0–100.0)
Platelets: 336 10*3/uL (ref 150–400)
RBC: 4.33 MIL/uL (ref 3.87–5.11)
RDW: 14.9 % (ref 11.5–15.5)
WBC: 9.6 10*3/uL (ref 4.0–10.5)

## 2018-04-06 LAB — BASIC METABOLIC PANEL
ANION GAP: 9 (ref 5–15)
BUN: 50 mg/dL — ABNORMAL HIGH (ref 6–20)
CALCIUM: 9 mg/dL (ref 8.9–10.3)
CHLORIDE: 110 mmol/L (ref 101–111)
CO2: 22 mmol/L (ref 22–32)
CREATININE: 2.5 mg/dL — AB (ref 0.44–1.00)
GFR calc non Af Amer: 18 mL/min — ABNORMAL LOW (ref >60)
GFR, EST AFRICAN AMERICAN: 21 mL/min — AB (ref >60)
Glucose, Bld: 94 mg/dL (ref 65–99)
Potassium: 5 mmol/L (ref 3.5–5.1)
SODIUM: 141 mmol/L (ref 135–145)

## 2018-04-06 LAB — SURGICAL PCR SCREEN
MRSA, PCR: NEGATIVE
Staphylococcus aureus: NEGATIVE

## 2018-04-06 MED ORDER — CHLORHEXIDINE GLUCONATE CLOTH 2 % EX PADS: 6.0000 | MEDICATED_PAD | Freq: Once | CUTANEOUS | Status: DC

## 2018-04-06 NOTE — Progress Notes (Addendum)
PCP: Burnard Bunting, MD  Cardiologist: has seen Dr. Einar Gip in the past for testing, no cardiac history/complications  EKG: obtained today  Stress test: 11/27/09 in Care Everywhere  ECHO: 11/27/09 and 2013 in Care Everywhere  Cardiac Cath: pt denies  Chest x-ray: pt denies-no respiratory infections/complications

## 2018-04-09 NOTE — Progress Notes (Signed)
Case:  [id number] Date/Time:  04/12/18 1005   Procedure:  MICRODISCECTOMY LUMBAR 4- LUMBAR 5 (Right ) - MICRODISCECTOMY LUMBAR 4- LUMBAR 5   Anesthesia type:  General   Pre-op diagnosis:  LUMBAR HERNIATED DISC   Location:  [facility] OR ROOM 19 / [facility]   Surgeon:  Newman Pies, MD      DISCUSSION: - Pt is a 72 year old female with hx HTN, asthma, CKD (stage IV)  - Pt has open, healing wound on RLE.  No signs/sx infection. Wound is being managed by wound clinic at [facility] (notes in care everywhere).  Wound care providers are aware of upcoming surgery.  I notified Nikki in Dr. Arnoldo Morale' office of wound   VS: BP 117/77   Pulse 90   Temp 36.5 C   Resp 20   Ht 5\' 4"  (1.626 m)   Wt 167 lb 15.9 oz (76.2 kg)   SpO2 96%   BMI 28.84 kg/m    PROVIDERS: PCP is Burnard Bunting, MD  Nephrologist is Corliss Parish, MD. Last office visit 10/05/17.    LABS: Labs reviewed: Acceptable for surgery.  - Cr 2.5.  Per nephrology records, pt has CKD stage IV and baseline Cr ~2.3.    (all labs ordered are listed, but only abnormal results are displayed)  Labs Reviewed  BASIC METABOLIC PANEL - Abnormal; Notable for the following components:      Result Value   BUN 50 (*)    Creatinine, Ser 2.50 (*)    GFR calc non Af Amer 18 (*)    GFR calc Af Amer 21 (*)    All other components within normal limits  SURGICAL PCR SCREEN  CBC    EKG 04/06/18: Sinus rhythm with PACs. Moderate voltage criteria for LVH, may be normal variant [facility])    CV:  Stress echo 04/24/12 (care everywhere; done as part of pre-transplant work up):  - Negative dobutamine stress echocardiogram with normal resting left ventricle function and no inducible ischemia at target heart rate. - The patient had no chest pain during stress - The patient achieved 87 % of maximum predicted heart rate.  Echo 04/24/12: (care everywhere; done as part of pre-transplant work up):  - The left ventricular size is normal. Normal LV wall  thickness. Normal LV wall motion is normal. - Normal RV wall thickness. - Mild tricuspid regurgitation. - No pericardial effusion.   Past Medical History:  Diagnosis Date  . Anxiety   . Arthritis   . 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 GERD (gastroesophageal reflux disease)   . History of kidney stones   . Hypertension   . Hypothyroidism   . Incontinent of urine   . Renal disorder    stage IV kidney disease  . Thyroid disease     Past Surgical History:  Procedure Laterality Date  . CHOLECYSTECTOMY    . DILATION AND CURETTAGE OF UTERUS    . EYE SURGERY Right    retina surgery  . KNEE SURGERY    . TONSILLECTOMY      MEDICATIONS: . baclofen (LIORESAL) 10 MG tablet  . cyanocobalamin (,VITAMIN B-12,) 1000 MCG/ML injection  . DULoxetine (CYMBALTA) 60 MG capsule  . estrogen, conjugated,-medroxyprogesterone (PREMPRO) 0.625-2.5 MG per tablet  . febuxostat (ULORIC) 40 MG tablet  . furosemide (LASIX) 40 MG tablet  . LYRICA 25 MG capsule  . oxyCODONE-acetaminophen (PERCOCET/ROXICET) 5-325 MG tablet  . pantoprazole (PROTONIX) 40 MG tablet  . predniSONE (  DELTASONE) 5 MG tablet  . SYNTHROID 150 MCG tablet  . telmisartan (MICARDIS) 20 MG tablet   No current facility-administered medications for this encounter.     If no changes, I anticipate pt can proceed with surgery as scheduled.   Willeen Cass, FNP-BC [facility]/Anesthesiology Phone: [phone] 04/10/2018 9:28 AM

## 2018-04-10 DIAGNOSIS — L97911 Non-pressure chronic ulcer of unspecified part of right lower leg limited to breakdown of skin: Secondary | ICD-10-CM | POA: Diagnosis not present

## 2018-04-10 DIAGNOSIS — I872 Venous insufficiency (chronic) (peripheral): Secondary | ICD-10-CM | POA: Diagnosis not present

## 2018-04-12 ENCOUNTER — Ambulatory Visit (HOSPITAL_COMMUNITY): Payer: Medicare Other

## 2018-04-12 ENCOUNTER — Other Ambulatory Visit: Payer: Self-pay

## 2018-04-12 ENCOUNTER — Encounter (HOSPITAL_COMMUNITY): Payer: Self-pay | Admitting: Anesthesiology

## 2018-04-12 ENCOUNTER — Ambulatory Visit (HOSPITAL_COMMUNITY): Payer: Medicare Other | Admitting: Emergency Medicine

## 2018-04-12 ENCOUNTER — Encounter (HOSPITAL_COMMUNITY)
Admission: RE | Disposition: A | Discharge status: Home / Self Care | Payer: Self-pay | Source: Ambulatory Visit | Attending: Neurosurgery

## 2018-04-12 ENCOUNTER — Ambulatory Visit (HOSPITAL_COMMUNITY)
Admission: RE | Admit: 2018-04-12 | Discharge: 2018-04-13 | Disposition: A | Discharge status: Home / Self Care | Payer: Medicare Other | Source: Ambulatory Visit | Attending: Neurosurgery | Admitting: Neurosurgery

## 2018-04-12 ENCOUNTER — Ambulatory Visit (HOSPITAL_COMMUNITY): Payer: Medicare Other | Admitting: Anesthesiology

## 2018-04-12 DIAGNOSIS — Z79899 Other long term (current) drug therapy: Secondary | ICD-10-CM | POA: Insufficient documentation

## 2018-04-12 DIAGNOSIS — Z888 Allergy status to other drugs, medicaments and biological substances status: Secondary | ICD-10-CM | POA: Insufficient documentation

## 2018-04-12 DIAGNOSIS — K219 Gastro-esophageal reflux disease without esophagitis: Secondary | ICD-10-CM | POA: Diagnosis not present

## 2018-04-12 DIAGNOSIS — I129 Hypertensive chronic kidney disease with stage 1 through stage 4 chronic kidney disease, or unspecified chronic kidney disease: Secondary | ICD-10-CM | POA: Diagnosis not present

## 2018-04-12 DIAGNOSIS — J45909 Unspecified asthma, uncomplicated: Secondary | ICD-10-CM | POA: Insufficient documentation

## 2018-04-12 DIAGNOSIS — Z419 Encounter for procedure for purposes other than remedying health state, unspecified: Secondary | ICD-10-CM

## 2018-04-12 DIAGNOSIS — M4326 Fusion of spine, lumbar region: Secondary | ICD-10-CM | POA: Diagnosis not present

## 2018-04-12 DIAGNOSIS — I10 Essential (primary) hypertension: Secondary | ICD-10-CM | POA: Diagnosis not present

## 2018-04-12 DIAGNOSIS — Z880 Allergy status to penicillin: Secondary | ICD-10-CM | POA: Insufficient documentation

## 2018-04-12 DIAGNOSIS — N184 Chronic kidney disease, stage 4 (severe): Secondary | ICD-10-CM | POA: Insufficient documentation

## 2018-04-12 DIAGNOSIS — M5126 Other intervertebral disc displacement, lumbar region: Secondary | ICD-10-CM | POA: Diagnosis present

## 2018-04-12 DIAGNOSIS — Z885 Allergy status to narcotic agent status: Secondary | ICD-10-CM | POA: Diagnosis not present

## 2018-04-12 DIAGNOSIS — E039 Hypothyroidism, unspecified: Secondary | ICD-10-CM | POA: Diagnosis not present

## 2018-04-12 DIAGNOSIS — Z882 Allergy status to sulfonamides status: Secondary | ICD-10-CM | POA: Diagnosis not present

## 2018-04-12 DIAGNOSIS — F419 Anxiety disorder, unspecified: Secondary | ICD-10-CM | POA: Diagnosis not present

## 2018-04-12 DIAGNOSIS — M5116 Intervertebral disc disorders with radiculopathy, lumbar region: Secondary | ICD-10-CM | POA: Insufficient documentation

## 2018-04-12 DIAGNOSIS — Z886 Allergy status to analgesic agent status: Secondary | ICD-10-CM | POA: Diagnosis not present

## 2018-04-12 HISTORY — PX: LUMBAR LAMINECTOMY/DECOMPRESSION MICRODISCECTOMY: SHX5026

## 2018-04-12 SURGERY — LUMBAR LAMINECTOMY/DECOMPRESSION MICRODISCECTOMY 1 LEVEL
Anesthesia: General | Site: Spine Lumbar | Laterality: Right

## 2018-04-12 MED ORDER — PROPOFOL 10 MG/ML IV BOLUS
INTRAVENOUS | Status: AC
  Filled 2018-04-12: qty 20

## 2018-04-12 MED ORDER — SODIUM CHLORIDE 0.9 % IV SOLN
INTRAVENOUS | Status: DC
  Administered 2018-04-12 (×2): via INTRAVENOUS

## 2018-04-12 MED ORDER — SUGAMMADEX SODIUM 200 MG/2ML IV SOLN
INTRAVENOUS | Status: DC | PRN
  Administered 2018-04-12: 150 mg via INTRAVENOUS

## 2018-04-12 MED ORDER — SODIUM CHLORIDE 0.9% FLUSH: 3.0000 mL | Freq: Two times a day (BID) | INTRAVENOUS | Status: DC

## 2018-04-12 MED ORDER — MORPHINE SULFATE (PF) 4 MG/ML IV SOLN: 4.0000 mg | INTRAVENOUS | Status: DC | PRN

## 2018-04-12 MED ORDER — DEXAMETHASONE SODIUM PHOSPHATE 10 MG/ML IJ SOLN
INTRAMUSCULAR | Status: AC
  Filled 2018-04-12: qty 1

## 2018-04-12 MED ORDER — FUROSEMIDE 40 MG PO TABS
40.0000 mg | ORAL_TABLET | Freq: Every day | ORAL | Status: DC
  Administered 2018-04-12: 40 mg via ORAL
  Filled 2018-04-12: qty 1

## 2018-04-12 MED ORDER — BISACODYL 10 MG RE SUPP: 10.0000 mg | Freq: Every day | RECTAL | Status: DC | PRN

## 2018-04-12 MED ORDER — BACITRACIN ZINC 500 UNIT/GM EX OINT
TOPICAL_OINTMENT | CUTANEOUS | Status: AC
  Filled 2018-04-12: qty 28.35

## 2018-04-12 MED ORDER — SODIUM CHLORIDE 0.9 % IV SOLN
INTRAVENOUS | Status: DC | PRN
  Administered 2018-04-12: 500 mL

## 2018-04-12 MED ORDER — MEPERIDINE HCL 50 MG/ML IJ SOLN: 6.2500 mg | INTRAMUSCULAR | Status: DC | PRN

## 2018-04-12 MED ORDER — HYDROMORPHONE HCL 1 MG/ML IJ SOLN
INTRAMUSCULAR | Status: AC
  Filled 2018-04-12: qty 1

## 2018-04-12 MED ORDER — IRBESARTAN 75 MG PO TABS
75.0000 mg | ORAL_TABLET | Freq: Every day | ORAL | Status: DC
  Administered 2018-04-12: 75 mg via ORAL
  Filled 2018-04-12 (×2): qty 1

## 2018-04-12 MED ORDER — PREDNISONE 5 MG PO TABS
5.0000 mg | ORAL_TABLET | Freq: Every evening | ORAL | Status: DC
  Administered 2018-04-12: 5 mg via ORAL
  Filled 2018-04-12: qty 1

## 2018-04-12 MED ORDER — PROMETHAZINE HCL 25 MG/ML IJ SOLN: 6.2500 mg | INTRAMUSCULAR | Status: DC | PRN

## 2018-04-12 MED ORDER — ONDANSETRON HCL 4 MG/2ML IJ SOLN: 4.0000 mg | Freq: Four times a day (QID) | INTRAMUSCULAR | Status: DC | PRN

## 2018-04-12 MED ORDER — BACITRACIN ZINC 500 UNIT/GM EX OINT
TOPICAL_OINTMENT | CUTANEOUS | Status: DC | PRN
  Administered 2018-04-12: 1 via TOPICAL

## 2018-04-12 MED ORDER — MENTHOL 3 MG MT LOZG: 1.0000 | LOZENGE | OROMUCOSAL | Status: DC | PRN

## 2018-04-12 MED ORDER — PANTOPRAZOLE SODIUM 40 MG PO TBEC
40.0000 mg | DELAYED_RELEASE_TABLET | Freq: Every day | ORAL | Status: DC
  Administered 2018-04-13: 40 mg via ORAL
  Filled 2018-04-12: qty 1

## 2018-04-12 MED ORDER — FENTANYL CITRATE (PF) 250 MCG/5ML IJ SOLN
INTRAMUSCULAR | Status: AC
  Filled 2018-04-12: qty 5

## 2018-04-12 MED ORDER — ROCURONIUM BROMIDE 100 MG/10ML IV SOLN
INTRAVENOUS | Status: DC | PRN
  Administered 2018-04-12: 40 mg via INTRAVENOUS
  Administered 2018-04-12: 10 mg via INTRAVENOUS

## 2018-04-12 MED ORDER — ACETAMINOPHEN 500 MG PO TABS
1000.0000 mg | ORAL_TABLET | Freq: Four times a day (QID) | ORAL | Status: DC
  Administered 2018-04-12 – 2018-04-13 (×3): 1000 mg via ORAL
  Filled 2018-04-12 (×3): qty 2

## 2018-04-12 MED ORDER — LACTATED RINGERS IV SOLN: INTRAVENOUS | Status: DC

## 2018-04-12 MED ORDER — DOCUSATE SODIUM 100 MG PO CAPS
100.0000 mg | ORAL_CAPSULE | Freq: Two times a day (BID) | ORAL | Status: DC
  Administered 2018-04-12: 100 mg via ORAL
  Filled 2018-04-12: qty 1

## 2018-04-12 MED ORDER — 0.9 % SODIUM CHLORIDE (POUR BTL) OPTIME
TOPICAL | Status: DC | PRN
  Administered 2018-04-12: 1000 mL

## 2018-04-12 MED ORDER — ARTIFICIAL TEARS OPHTHALMIC OINT
TOPICAL_OINTMENT | OPHTHALMIC | Status: AC
  Filled 2018-04-12: qty 3.5

## 2018-04-12 MED ORDER — ONDANSETRON HCL 4 MG PO TABS: 4.0000 mg | ORAL_TABLET | Freq: Four times a day (QID) | ORAL | Status: DC | PRN

## 2018-04-12 MED ORDER — DULOXETINE HCL 30 MG PO CPEP
60.0000 mg | ORAL_CAPSULE | Freq: Every day | ORAL | Status: DC
  Administered 2018-04-12: 60 mg via ORAL
  Filled 2018-04-12: qty 2

## 2018-04-12 MED ORDER — BACLOFEN 10 MG PO TABS
10.0000 mg | ORAL_TABLET | Freq: Every day | ORAL | Status: DC | PRN
  Administered 2018-04-12: 10 mg via ORAL
  Filled 2018-04-12 (×2): qty 1

## 2018-04-12 MED ORDER — SODIUM CHLORIDE 0.9 % IV SOLN: 250.0000 mL | INTRAVENOUS | Status: DC

## 2018-04-12 MED ORDER — FENTANYL CITRATE (PF) 100 MCG/2ML IJ SOLN
INTRAMUSCULAR | Status: DC | PRN
  Administered 2018-04-12 (×4): 50 ug via INTRAVENOUS

## 2018-04-12 MED ORDER — BUPIVACAINE-EPINEPHRINE (PF) 0.5% -1:200000 IJ SOLN
INTRAMUSCULAR | Status: DC | PRN
  Administered 2018-04-12: 30 mL via PERINEURAL

## 2018-04-12 MED ORDER — VANCOMYCIN HCL IN DEXTROSE 1-5 GM/200ML-% IV SOLN
1000.0000 mg | INTRAVENOUS | Status: AC
  Administered 2018-04-12: 1000 mg via INTRAVENOUS

## 2018-04-12 MED ORDER — SUGAMMADEX SODIUM 200 MG/2ML IV SOLN
INTRAVENOUS | Status: AC
  Filled 2018-04-12: qty 2

## 2018-04-12 MED ORDER — FEBUXOSTAT 40 MG PO TABS
40.0000 mg | ORAL_TABLET | Freq: Every day | ORAL | Status: DC
  Administered 2018-04-12: 40 mg via ORAL
  Filled 2018-04-12: qty 1

## 2018-04-12 MED ORDER — PROPOFOL 10 MG/ML IV BOLUS
INTRAVENOUS | Status: DC | PRN
  Administered 2018-04-12: 110 mg via INTRAVENOUS

## 2018-04-12 MED ORDER — VANCOMYCIN HCL IN DEXTROSE 1-5 GM/200ML-% IV SOLN
1000.0000 mg | Freq: Once | INTRAVENOUS | Status: AC
  Administered 2018-04-12: 1000 mg via INTRAVENOUS
  Filled 2018-04-12: qty 200

## 2018-04-12 MED ORDER — LIDOCAINE 2% (20 MG/ML) 5 ML SYRINGE
INTRAMUSCULAR | Status: AC
  Filled 2018-04-12: qty 5

## 2018-04-12 MED ORDER — OXYCODONE HCL 5 MG PO TABS
10.0000 mg | ORAL_TABLET | ORAL | Status: DC | PRN
  Administered 2018-04-12 – 2018-04-13 (×2): 10 mg via ORAL
  Filled 2018-04-12 (×3): qty 2

## 2018-04-12 MED ORDER — DEXAMETHASONE SODIUM PHOSPHATE 10 MG/ML IJ SOLN
INTRAMUSCULAR | Status: DC | PRN
  Administered 2018-04-12: 10 mg via INTRAVENOUS

## 2018-04-12 MED ORDER — CONJ ESTROG-MEDROXYPROGEST ACE 0.625-2.5 MG PO TABS: 1.0000 | ORAL_TABLET | Freq: Every day | ORAL | Status: DC

## 2018-04-12 MED ORDER — ONDANSETRON HCL 4 MG/2ML IJ SOLN
INTRAMUSCULAR | Status: DC | PRN
  Administered 2018-04-12: 4 mg via INTRAVENOUS

## 2018-04-12 MED ORDER — HYDROMORPHONE HCL 1 MG/ML IJ SOLN
0.2500 mg | INTRAMUSCULAR | Status: DC | PRN
  Administered 2018-04-12: 0.5 mg via INTRAVENOUS
  Administered 2018-04-12: 0.25 mg via INTRAVENOUS
  Administered 2018-04-12 (×2): 0.5 mg via INTRAVENOUS
  Administered 2018-04-12: 0.25 mg via INTRAVENOUS

## 2018-04-12 MED ORDER — THROMBIN 5000 UNITS EX SOLR
CUTANEOUS | Status: DC | PRN
  Administered 2018-04-12: 5000 [IU] via TOPICAL

## 2018-04-12 MED ORDER — BUPIVACAINE-EPINEPHRINE (PF) 0.5% -1:200000 IJ SOLN
INTRAMUSCULAR | Status: AC
  Filled 2018-04-12: qty 30

## 2018-04-12 MED ORDER — CYCLOBENZAPRINE HCL 10 MG PO TABS
10.0000 mg | ORAL_TABLET | Freq: Three times a day (TID) | ORAL | Status: DC | PRN
  Filled 2018-04-12: qty 1

## 2018-04-12 MED ORDER — PREGABALIN 25 MG PO CAPS
25.0000 mg | ORAL_CAPSULE | Freq: Every day | ORAL | Status: DC
  Administered 2018-04-12: 25 mg via ORAL
  Filled 2018-04-12: qty 1

## 2018-04-12 MED ORDER — ROCURONIUM BROMIDE 50 MG/5ML IV SOLN
INTRAVENOUS | Status: AC
  Filled 2018-04-12: qty 1

## 2018-04-12 MED ORDER — ONDANSETRON HCL 4 MG/2ML IJ SOLN
INTRAMUSCULAR | Status: AC
  Filled 2018-04-12: qty 2

## 2018-04-12 MED ORDER — SODIUM CHLORIDE 0.9% FLUSH: 3.0000 mL | INTRAVENOUS | Status: DC | PRN

## 2018-04-12 MED ORDER — LIDOCAINE HCL (CARDIAC) PF 100 MG/5ML IV SOSY
PREFILLED_SYRINGE | INTRAVENOUS | Status: DC | PRN
  Administered 2018-04-12: 60 mg via INTRAVENOUS

## 2018-04-12 MED ORDER — ZOLPIDEM TARTRATE 5 MG PO TABS: 5.0000 mg | ORAL_TABLET | Freq: Every evening | ORAL | Status: DC | PRN

## 2018-04-12 MED ORDER — THROMBIN 5000 UNITS EX SOLR
CUTANEOUS | Status: AC
  Filled 2018-04-12: qty 10000

## 2018-04-12 MED ORDER — ACETAMINOPHEN 650 MG RE SUPP: 650.0000 mg | RECTAL | Status: DC | PRN

## 2018-04-12 MED ORDER — OXYCODONE-ACETAMINOPHEN 5-325 MG PO TABS
1.0000 | ORAL_TABLET | ORAL | Status: DC | PRN
Start: 2018-04-12 — End: 2018-04-12

## 2018-04-12 MED ORDER — ARTIFICIAL TEARS OPHTHALMIC OINT
TOPICAL_OINTMENT | OPHTHALMIC | Status: DC | PRN
  Administered 2018-04-12: 1 via OPHTHALMIC

## 2018-04-12 MED ORDER — PHENOL 1.4 % MT LIQD: 1.0000 | OROMUCOSAL | Status: DC | PRN

## 2018-04-12 MED ORDER — ACETAMINOPHEN 325 MG PO TABS: 650.0000 mg | ORAL_TABLET | ORAL | Status: DC | PRN

## 2018-04-12 MED ORDER — LEVOTHYROXINE SODIUM 75 MCG PO TABS
150.0000 ug | ORAL_TABLET | Freq: Every day | ORAL | Status: DC
  Administered 2018-04-13: 150 ug via ORAL
  Filled 2018-04-12: qty 2

## 2018-04-12 SURGICAL SUPPLY — 52 items
BAG DECANTER FOR FLEXI CONT (MISCELLANEOUS) ×2 IMPLANT
BENZOIN TINCTURE PRP APPL 2/3 (GAUZE/BANDAGES/DRESSINGS) ×2 IMPLANT
BLADE CLIPPER SURG (BLADE) IMPLANT
BUR MATCHSTICK NEURO 3.0 LAGG (BURR) ×2 IMPLANT
BUR PRECISION FLUTE 6.0 (BURR) ×2 IMPLANT
CANISTER SUCT 3000ML PPV (MISCELLANEOUS) ×2 IMPLANT
CARTRIDGE OIL MAESTRO DRILL (MISCELLANEOUS) ×1 IMPLANT
DIFFUSER DRILL AIR PNEUMATIC (MISCELLANEOUS) ×2 IMPLANT
DRAPE HALF SHEET 40X57 (DRAPES) ×2 IMPLANT
DRAPE LAPAROTOMY 100X72X124 (DRAPES) ×2 IMPLANT
DRAPE MICROSCOPE LEICA (MISCELLANEOUS) ×2 IMPLANT
DRAPE POUCH INSTRU U-SHP 10X18 (DRAPES) ×2 IMPLANT
DRAPE SURG 17X23 STRL (DRAPES) ×8 IMPLANT
DRSG OPSITE POSTOP 3X4 (GAUZE/BANDAGES/DRESSINGS) ×2 IMPLANT
ELECT BLADE 4.0 EZ CLEAN MEGAD (MISCELLANEOUS) ×2
ELECT REM PT RETURN 9FT ADLT (ELECTROSURGICAL) ×2
ELECTRODE BLDE 4.0 EZ CLN MEGD (MISCELLANEOUS) ×1 IMPLANT
ELECTRODE REM PT RTRN 9FT ADLT (ELECTROSURGICAL) ×1 IMPLANT
GAUZE SPONGE 4X4 12PLY STRL (GAUZE/BANDAGES/DRESSINGS) ×2 IMPLANT
GAUZE SPONGE 4X4 16PLY XRAY LF (GAUZE/BANDAGES/DRESSINGS) IMPLANT
GLOVE BIO SURGEON STRL SZ8 (GLOVE) ×6 IMPLANT
GLOVE BIO SURGEON STRL SZ8.5 (GLOVE) ×4 IMPLANT
GLOVE BIOGEL PI IND STRL 7.0 (GLOVE) ×2 IMPLANT
GLOVE BIOGEL PI IND STRL 7.5 (GLOVE) ×2 IMPLANT
GLOVE BIOGEL PI IND STRL 8.5 (GLOVE) ×1 IMPLANT
GLOVE BIOGEL PI INDICATOR 7.0 (GLOVE) ×2
GLOVE BIOGEL PI INDICATOR 7.5 (GLOVE) ×2
GLOVE BIOGEL PI INDICATOR 8.5 (GLOVE) ×1
GLOVE ECLIPSE 8.0 STRL XLNG CF (GLOVE) ×2 IMPLANT
GOWN STRL REUS W/ TWL LRG LVL3 (GOWN DISPOSABLE) ×2 IMPLANT
GOWN STRL REUS W/ TWL XL LVL3 (GOWN DISPOSABLE) ×2 IMPLANT
GOWN STRL REUS W/TWL 2XL LVL3 (GOWN DISPOSABLE) IMPLANT
GOWN STRL REUS W/TWL LRG LVL3 (GOWN DISPOSABLE) ×2
GOWN STRL REUS W/TWL XL LVL3 (GOWN DISPOSABLE) ×2
KIT BASIN OR (CUSTOM PROCEDURE TRAY) ×2 IMPLANT
KIT TURNOVER KIT B (KITS) ×2 IMPLANT
NEEDLE HYPO 21X1.5 SAFETY (NEEDLE) IMPLANT
NEEDLE HYPO 22GX1.5 SAFETY (NEEDLE) ×2 IMPLANT
NS IRRIG 1000ML POUR BTL (IV SOLUTION) ×2 IMPLANT
OIL CARTRIDGE MAESTRO DRILL (MISCELLANEOUS) ×2
PACK LAMINECTOMY NEURO (CUSTOM PROCEDURE TRAY) ×2 IMPLANT
PAD ARMBOARD 7.5X6 YLW CONV (MISCELLANEOUS) ×6 IMPLANT
PATTIES SURGICAL .5 X1 (DISPOSABLE) IMPLANT
RUBBERBAND STERILE (MISCELLANEOUS) ×4 IMPLANT
SPONGE SURGIFOAM ABS GEL SZ50 (HEMOSTASIS) ×2 IMPLANT
STRIP CLOSURE SKIN 1/2X4 (GAUZE/BANDAGES/DRESSINGS) ×2 IMPLANT
SUT VIC AB 1 CT1 18XBRD ANBCTR (SUTURE) ×1 IMPLANT
SUT VIC AB 1 CT1 8-18 (SUTURE) ×1
SUT VIC AB 2-0 CP2 18 (SUTURE) ×2 IMPLANT
TOWEL GREEN STERILE (TOWEL DISPOSABLE) ×2 IMPLANT
TOWEL GREEN STERILE FF (TOWEL DISPOSABLE) ×2 IMPLANT
WATER STERILE IRR 1000ML POUR (IV SOLUTION) ×2 IMPLANT

## 2018-04-12 NOTE — Anesthesia Procedure Notes (Signed)
Procedure Name: Intubation Date/Time: 04/12/2018 10:17 AM Performed by: Candis Shine, CRNA Pre-anesthesia Checklist: Patient identified, Emergency Drugs available, Suction available and Patient being monitored Patient Re-evaluated:Patient Re-evaluated prior to induction Oxygen Delivery Method: Circle System Utilized Preoxygenation: Pre-oxygenation with 100% oxygen Induction Type: IV induction Ventilation: Mask ventilation without difficulty Laryngoscope Size: Mac and 3 Grade View: Grade I Tube type: Oral Tube size: 7.0 mm Number of attempts: 1 Airway Equipment and Method: Stylet Placement Confirmation: ETT inserted through vocal cords under direct vision,  positive ETCO2 and breath sounds checked- equal and bilateral Secured at: 22 cm Tube secured with: Tape Dental Injury: Teeth and Oropharynx as per pre-operative assessment

## 2018-04-12 NOTE — Transfer of Care (Signed)
Immediate Anesthesia Transfer of Care Note  Patient: Carolyn Robertson  Procedure(s) Performed: MICRODISCECTOMY LUMBAR FOUR- LUMBAR FIVE (Right Spine Lumbar)  Patient Location: PACU  Anesthesia Type:General  Level of Consciousness: awake, alert  and oriented  Airway & Oxygen Therapy: Patient Spontanous Breathing and Patient connected to nasal cannula oxygen  Post-op Assessment: Report given to RN and Post -op Vital signs reviewed and stable  Post vital signs: Reviewed and stable  Last Vitals:  Vitals Value Taken Time  BP 160/89 04/12/2018 12:06 PM  Temp    Pulse 94 04/12/2018 12:06 PM  Resp 16 04/12/2018 12:06 PM  SpO2 95 % 04/12/2018 12:06 PM  Vitals shown include unvalidated device data.  Last Pain:  Vitals:   04/12/18 0901  TempSrc:   PainSc: 6          Complications: No apparent anesthesia complications

## 2018-04-12 NOTE — Progress Notes (Signed)
Pt arrived to PACU with skin tear to right upper forearm. RN placed gauze and tegaderm to site.

## 2018-04-12 NOTE — Anesthesia Postprocedure Evaluation (Signed)
Anesthesia Post Note  Patient: Carolyn Robertson  Procedure(s) Performed: MICRODISCECTOMY LUMBAR FOUR- LUMBAR FIVE (Right Spine Lumbar)     Patient location during evaluation: PACU Anesthesia Type: General Level of consciousness: awake and alert Pain management: pain level controlled Vital Signs Assessment: post-procedure vital signs reviewed and stable Respiratory status: spontaneous breathing, nonlabored ventilation, respiratory function stable and patient connected to nasal cannula oxygen Cardiovascular status: blood pressure returned to baseline and stable Postop Assessment: no apparent nausea or vomiting Anesthetic complications: no    Last Vitals:  Vitals:   04/12/18 1350 04/12/18 1351  BP: 115/62   Pulse: 87 85  Resp: 19 (!) 22  Temp:    SpO2: 99% 97%    Last Pain:  Vitals:   04/12/18 1405  TempSrc:   PainSc: 0-No pain                 Effie Berkshire

## 2018-04-12 NOTE — H&P (Signed)
Subjective: The patient is a 72 year old white female who has complained of back, right buttock and leg pain.  She has failed medical management and was worked up with a lumbar MRI which demonstrated a herniated disc at L4-5 on the right.  I discussed the various treatment option with the patient.  She has decided to proceed with surgery.  In the interim the patient has developed some mild right foot swelling.  She has no calf tenderness respiratory symptoms, etc.  Past Medical History:  Diagnosis Date  . Anxiety   . Arthritis   . 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 GERD (gastroesophageal reflux disease)   . History of kidney stones   . Hypertension   . Hypothyroidism   . Incontinent of urine   . Renal disorder    stage IV kidney disease  . Thyroid disease     Past Surgical History:  Procedure Laterality Date  . CHOLECYSTECTOMY    . DILATION AND CURETTAGE OF UTERUS    . EYE SURGERY Right    retina surgery  . KNEE SURGERY    . TONSILLECTOMY      Allergies  Allergen Reactions  . Other Swelling    A bandage/gauze that was used on lower extremity wound at [facility] health, unsure type- Pt states her foot turned red and swelled up  . Penicillins Swelling and Rash    Has patient had a PCN reaction causing immediate rash, facial/tongue/throat swelling, SOB or lightheadedness with hypotension: Unknown Has patient had a PCN reaction causing severe rash involving mucus membranes or skin necrosis: Unknown Has patient had a PCN reaction that required hospitalization: No Has patient had a PCN reaction occurring within the last 10 years: No If all of the above answers are "NO", then may proceed with Cephalosporin use.   . Sulfa Antibiotics Itching and Swelling  . Codeine Nausea Only  . Hydrocodone-Acetaminophen Other (See Comments)    Causes hallucinations  . Naproxen Sodium Swelling and Other (See Comments)    (ALEVE) Red Man Syndrome  . Tape Rash and Other  (See Comments)    BURNS SKIN --PAPER TAPE IS OKAY    Social History   Tobacco Use  . Smoking status: Never Smoker  . Smokeless tobacco: Never Used  Substance Use Topics  . Alcohol use: No    Comment: rarely    History reviewed. No pertinent family history. Prior to Admission medications   Medication Sig Start Date End Date Taking? Authorizing Provider  baclofen (LIORESAL) 10 MG tablet Take 10 mg by mouth daily as needed for muscle spasms.   Yes [provider]  cyanocobalamin (,VITAMIN B-12,) 1000 MCG/ML injection Inject 1,000 mcg into the muscle every 30 (thirty) days.   Yes [provider]  DULoxetine (CYMBALTA) 60 MG capsule Take 60 mg by mouth at bedtime.   Yes [provider]  estrogen, conjugated,-medroxyprogesterone (PREMPRO) 0.625-2.5 MG per tablet Take 1 tablet by mouth daily.   Yes [provider]  febuxostat (ULORIC) 40 MG tablet Take 40 mg by mouth at bedtime.    Yes [provider]  furosemide (LASIX) 40 MG tablet Take 40-80 mg by mouth daily.    Yes [provider]  LYRICA 25 MG capsule Take 25 mg by mouth at bedtime. 03/17/18  Yes [provider]  oxyCODONE-acetaminophen (PERCOCET/ROXICET) 5-325 MG tablet Take 1 tablet by mouth every 4 (four) hours as needed for severe pain. Patient taking differently: Take 1 tablet by  mouth every 6 (six) hours as needed for severe pain (FOR PAIN.).  02/25/16  Yes Julianne Rice, MD  pantoprazole (PROTONIX) 40 MG tablet Take 40 mg by mouth daily before breakfast. 02/22/18  Yes [provider]  predniSONE (DELTASONE) 5 MG tablet Take 5 mg by mouth every evening.  03/17/18  Yes [provider]  SYNTHROID 150 MCG tablet Take 150 mcg by mouth daily before breakfast. 01/05/18  Yes [provider]  telmisartan (MICARDIS) 20 MG tablet Take 20 mg by mouth at bedtime. 03/06/18  Yes [provider]     Review of Systems  Positive ROS: As above  All other  systems have been reviewed and were otherwise negative with the exception of those mentioned in the HPI and as above.  Objective: Vital signs in last 24 hours: Temp:  [97.7 F (36.5 C)] 97.7 F (36.5 C) (06/27 0833) Pulse Rate:  [79] 79 (06/27 0833) Resp:  [20] 20 (06/27 0833) BP: (159)/(74) 159/74 (06/27 0833) SpO2:  [100 %] 100 % (06/27 0833) Weight:  [76.2 kg (167 lb 15.9 oz)] 76.2 kg (167 lb 15.9 oz) (06/27 0901) Estimated body mass index is 28.84 kg/m as calculated from the following:   Height as of this encounter: 5\' 4"  (1.626 m).   Weight as of this encounter: 76.2 kg (167 lb 15.9 oz).   General Appearance: Alert Head: Normocephalic, without obvious abnormality, atraumatic Eyes: PERRL, conjunctiva/corneas clear, EOM's intact,    Ears: Normal  Throat: Normal  Neck: Supple, Back: She has a positive right straight leg raise test Lungs: Clear to auscultation bilaterally, respirations unlabored Heart: Regular rate and rhythm, no murmur, rub or gallop Abdomen: Soft, non-tender Extremities: There is minimal edema in her right lower extremity. Skin: unremarkable  NEUROLOGIC:   Mental status: alert and oriented,Motor Exam - grossly normal except she has slight weakness in the right EHL. Sensory Exam - grossly normal Reflexes:  Coordination - grossly normal Gait - grossly normal Balance - grossly normal Cranial Nerves: I: smell Not tested  II: visual acuity  OS: Normal  OD: Normal   II: visual fields Full to confrontation  II: pupils Equal, round, reactive to light  III,VII: ptosis None  III,IV,VI: extraocular muscles  Full ROM  V: mastication Normal  V: facial light touch sensation  Normal  V,VII: corneal reflex  Present  VII: facial muscle function - upper  Normal  VII: facial muscle function - lower Normal  VIII: hearing Not tested  IX: soft palate elevation  Normal  IX,X: gag reflex Present  XI: trapezius strength  5/5  XI: sternocleidomastoid strength 5/5   XI: neck flexion strength  5/5  XII: tongue strength  Normal    Data Review Lab Results  Component Value Date   WBC 9.6 04/06/2018   HGB 12.6 04/06/2018   HCT 41.2 04/06/2018   MCV 95.2 04/06/2018   PLT 336 04/06/2018   Lab Results  Component Value Date   NA 141 04/06/2018   K 5.0 04/06/2018   CL 110 04/06/2018   CO2 22 04/06/2018   BUN 50 (H) 04/06/2018   CREATININE 2.50 (H) 04/06/2018   GLUCOSE 94 04/06/2018   No results found for: INR, PROTIME  Assessment/Plan: Right L4-5 herniated disc, right lumbar radiculopathy: I have discussed the situation.  I reviewed her imaging studies with her.  We have discussed the various treatment options including surgery.  I have described the surgical treatment option of a right L4-5 discectomy.  I have shown  her surgical models.  I have given her a surgical pamphlet.  We have discussed the risks, benefits, alternatives, expected postoperative course, and likelihood of achieving our goals with surgery.  We discussed her mild right foot swelling.  I do not think this is a DVT.  Is probably secondary to decreased mobility/disuse.  I have answered all her questions.  She has decided to proceed with surgery.   Ophelia Charter 04/12/2018 9:32 AM

## 2018-04-12 NOTE — Op Note (Signed)
Brief history: The patient is a 72 year old white female who has complained of back and right leg pain. She has failed medical management. She was worked up with a lumbar MRI which demonstrated a hernia disc at L4-5 on the right. I discussed the various treatment options with her including surgery. She has weighed the risks, benefits, and alternatives of surgery and decided to proceed with a right L4-5 discectomy.  Preoperative diagnosis: Right L4-5 herniated disc, lumbago, lumbar radiculopathy  Postoperative diagnosis: The same  Procedure: Right L4-5 Intervertebral discectomy using micro-dissection  Surgeon: Dr. Earle Gell  Asst.: Dr. Vertell Limber  Anesthesia: Gen. endotracheal  Estimated blood loss: Minimal  Drains: None  Complications: None  Description of procedure: The patient was brought to the operating room by the anesthesia team. General endotracheal anesthesia was induced. The patient was turned to the prone position on the Wilson frame. The patient's lumbosacral region was then prepared with Betadine scrub and Betadine solution. Sterile drapes were applied.  I then injected the area to be incised with Marcaine with epinephrine solution. I then used a scalpel to make a linear midline incision over the L4-5 intervertebral disc space. I then used electrocautery to perform a right sided subperiosteal dissection exposing the spinous process and lamina of L4 and L5. We obtained intraoperative radiograph to confirm our location. I then inserted the [facility] retractor for exposure.  We then brought the operative microscope into the field. Under its magnification and illumination we completed the microdissection. I used a high-speed drill to perform a laminotomy at L4-5 on the right. I then used a Kerrison punches to complete the right for hemilaminectomy and removed the ligamentum flavum at L3-4 and L4-5. We then used microdissection to free up the thecal sac and the right L4 and L5 nerve  root from the epidural tissue. I then used a Kerrison punch to perform a foraminotomy at about the right L4 and L5 nerve root. We then using the nerve root retractor to gently retract the thecal sac and the right L4 nerve root medially. This exposed the intervertebral disc. We identified the ruptured disc and remove it with the pituitary forceps. We inspected the intervertebral disc at L4-5. There was a hole in the annulus but no impending herniations. We did not perform an intervertebral discectomy.  I then palpated along the ventral surface of the thecal sac and along exit route of the right L4 and L5 nerve root and noted that the neural structures were well decompressed. This completed the decompression.  We then obtained hemostasis using bipolar electrocautery. We irrigated the wound out with bacitracin solution. We then removed the retractor. We then reapproximated the patient's thoracolumbar fascia with interrupted #1 Vicryl suture. We then reapproximated the patient's subcutaneous tissue with interrupted 2-0 Vicryl suture. We then reapproximated patient's skin with Steri-Strips and benzoin. The was then coated with bacitracin ointment. The drapes were removed. The patient was subsequently returned to the supine position where they were extubated by the anesthesia team. The patient was then transported to the postanesthesia care unit in stable condition. All sponge instrument and needle counts were reportedly correct at the end of this case.

## 2018-04-12 NOTE — Anesthesia Preprocedure Evaluation (Addendum)
Anesthesia Evaluation  Patient identified by MRN, date of birth, ID band Patient awake    Reviewed: Allergy & Precautions, NPO status , Patient's Chart, lab work & pertinent test results  Airway Mallampati: II  TM Distance: >3 FB Neck ROM: Full    Dental  (+) Teeth Intact, Dental Advisory Given   Pulmonary asthma ,    breath sounds clear to auscultation       Cardiovascular hypertension,  Rhythm:Regular Rate:Normal     Neuro/Psych Anxiety negative neurological ROS     GI/Hepatic GERD  ,  Endo/Other  Hypothyroidism   Renal/GU CRFRenal disease  negative genitourinary   Musculoskeletal  (+) Arthritis ,   Abdominal Normal abdominal exam  (+)   Peds  Hematology negative hematology ROS (+)   Anesthesia Other Findings   Reproductive/Obstetrics                            Lab Results  Component Value Date   WBC 9.6 04/06/2018   HGB 12.6 04/06/2018   HCT 41.2 04/06/2018   MCV 95.2 04/06/2018   PLT 336 04/06/2018   Lab Results  Component Value Date   CREATININE 2.50 (H) 04/06/2018   BUN 50 (H) 04/06/2018   NA 141 04/06/2018   K 5.0 04/06/2018   CL 110 04/06/2018   CO2 22 04/06/2018   No results found for: INR, PROTIME  EKG: normal sinus rhythm, PAC's noted.  Anesthesia Physical Anesthesia Plan  ASA: III  Anesthesia Plan: General   Post-op Pain Management:    Induction: Intravenous  PONV Risk Score and Plan: 4 or greater and Ondansetron, Dexamethasone, Treatment may vary due to age or medical condition and Midazolam  Airway Management Planned: Oral ETT  Additional Equipment: None  Intra-op Plan:   Post-operative Plan: Extubation in OR  Informed Consent: I have reviewed the patients History and Physical, chart, labs and discussed the procedure including the risks, benefits and alternatives for the proposed anesthesia with the patient or authorized representative who has  indicated his/her understanding and acceptance.   Dental advisory given  Plan Discussed with: CRNA  Anesthesia Plan Comments:        Anesthesia Quick Evaluation

## 2018-04-12 NOTE — Progress Notes (Signed)
Pharmacy Antibiotic Note  Carolyn Robertson is a 72 y.o. female admitted on 04/12/2018 with surgical prophylaxis.  Pharmacy has been consulted for vancomycin for 1 dose 12 hours post-op.  Plan: Vancomycin 1000 mg IV x 1 dose. No drains per Neurosurgery Op note  Height: 5\' 4"  (162.6 cm) Weight: 167 lb 15.9 oz (76.2 kg) IBW/kg (Calculated) : 54.7  Temp (24hrs), Avg:97.7 F (36.5 C), Min:97.5 F (36.4 C), Max:98.2 F (36.8 C)  Recent Labs  Lab 04/06/18 1110  WBC 9.6  CREATININE 2.50*    Estimated Creatinine Clearance: 19.7 mL/min (A) (by C-G formula based on SCr of 2.5 mg/dL (H)).    Allergies  Allergen Reactions  . Other Swelling    A bandage/gauze that was used on lower extremity wound at [facility] health, unsure type- Pt states her foot turned red and swelled up  . Penicillins Swelling and Rash    Has patient had a PCN reaction causing immediate rash, facial/tongue/throat swelling, SOB or lightheadedness with hypotension: Unknown Has patient had a PCN reaction causing severe rash involving mucus membranes or skin necrosis: Unknown Has patient had a PCN reaction that required hospitalization: No Has patient had a PCN reaction occurring within the last 10 years: No If all of the above answers are "NO", then may proceed with Cephalosporin use.   . Sulfa Antibiotics Itching and Swelling  . Codeine Nausea Only  . Hydrocodone-Acetaminophen Other (See Comments)    Causes hallucinations  . Naproxen Sodium Swelling and Other (See Comments)    (ALEVE) Red Man Syndrome  . Tape Rash and Other (See Comments)    BURNS SKIN --PAPER TAPE IS OKAY     Thank you for allowing [place] to participate in this patients care.  Jens Som, PharmD Clinical phone for 04/12/2018 from 3-10:30p: [phone] Please utilize Amion for appropriate number for your unit pharmacist. 04/12/2018 5:09 PM

## 2018-04-13 ENCOUNTER — Encounter (HOSPITAL_COMMUNITY): Payer: Self-pay | Admitting: Neurosurgery

## 2018-04-13 DIAGNOSIS — K219 Gastro-esophageal reflux disease without esophagitis: Secondary | ICD-10-CM | POA: Diagnosis not present

## 2018-04-13 DIAGNOSIS — M5116 Intervertebral disc disorders with radiculopathy, lumbar region: Secondary | ICD-10-CM | POA: Diagnosis not present

## 2018-04-13 DIAGNOSIS — E039 Hypothyroidism, unspecified: Secondary | ICD-10-CM | POA: Diagnosis not present

## 2018-04-13 DIAGNOSIS — I129 Hypertensive chronic kidney disease with stage 1 through stage 4 chronic kidney disease, or unspecified chronic kidney disease: Secondary | ICD-10-CM | POA: Diagnosis not present

## 2018-04-13 DIAGNOSIS — N184 Chronic kidney disease, stage 4 (severe): Secondary | ICD-10-CM | POA: Diagnosis not present

## 2018-04-13 DIAGNOSIS — F419 Anxiety disorder, unspecified: Secondary | ICD-10-CM | POA: Diagnosis not present

## 2018-04-13 MED ORDER — DOCUSATE SODIUM 100 MG PO CAPS: 100.0000 mg | ORAL_CAPSULE | Freq: Two times a day (BID) | ORAL | 0 refills | Status: DC

## 2018-04-13 MED ORDER — OXYCODONE HCL 10 MG PO TABS: 10.0000 mg | ORAL_TABLET | ORAL | 0 refills | Status: DC | PRN

## 2018-04-13 NOTE — Discharge Summary (Signed)
Physician Discharge Summary  Patient ID: Carolyn Robertson MRN: [id number] DOB/AGE: June 18, 1946 72 y.o.  Admit date: 04/12/2018 Discharge date: 04/13/2018  Admission Diagnoses: Right L4-5 herniated disc, right lumbar radiculopathy, lumbago  Discharge Diagnoses: The same Active Problems:   Lumbar herniated disc   Discharged Condition: good  Hospital Course: I performed a right L4-5 discectomy on the patient on 04/12/2018.  The surgery went well.  The patient's postoperative course was unremarkable.  On postoperative day #1 her leg pain was gone and she requested discharge home.  She was given written and oral discharge instructions.  All her questions were answered.  Consults: Physical therapy Significant Diagnostic Studies: None Treatments: Right L4-5 discectomy using microdissection Discharge Exam: Blood pressure 131/71, pulse 82, temperature 98.6 F (37 C), temperature source Oral, resp. rate 18, height 5\' 4"  (1.626 m), weight 76.2 kg (167 lb 15.9 oz), SpO2 99 %. The patient is alert and pleasant.  Her lower extremity strength is normal.  She looks well.  Disposition: Home  Discharge Instructions    Call MD for:  difficulty breathing, headache or visual disturbances   Complete by:  As directed    Call MD for:  difficulty breathing, headache or visual disturbances   Complete by:  As directed    Call MD for:  extreme fatigue   Complete by:  As directed    Call MD for:  extreme fatigue   Complete by:  As directed    Call MD for:  hives   Complete by:  As directed    Call MD for:  hives   Complete by:  As directed    Call MD for:  persistant dizziness or light-headedness   Complete by:  As directed    Call MD for:  persistant dizziness or light-headedness   Complete by:  As directed    Call MD for:  persistant nausea and vomiting   Complete by:  As directed    Call MD for:  persistant nausea and vomiting   Complete by:  As directed    Call MD for:  redness, tenderness, or  signs of infection (pain, swelling, redness, odor or green/yellow discharge around incision site)   Complete by:  As directed    Call MD for:  redness, tenderness, or signs of infection (pain, swelling, redness, odor or green/yellow discharge around incision site)   Complete by:  As directed    Call MD for:  severe uncontrolled pain   Complete by:  As directed    Call MD for:  severe uncontrolled pain   Complete by:  As directed    Call MD for:  temperature >100.4   Complete by:  As directed    Call MD for:  temperature >100.4   Complete by:  As directed    Diet - low sodium heart healthy   Complete by:  As directed    Diet - low sodium heart healthy   Complete by:  As directed    Discharge instructions   Complete by:  As directed    Call [phone] for a followup appointment. Take a stool softener while you are using pain medications.   Discharge instructions   Complete by:  As directed    Call [phone] for a followup appointment. Take a stool softener while you are using pain medications.   Driving Restrictions   Complete by:  As directed    Do not drive for 2 weeks.   Driving Restrictions   Complete by:  As  directed    Do not drive for 2 weeks.   Increase activity slowly   Complete by:  As directed    Increase activity slowly   Complete by:  As directed    Lifting restrictions   Complete by:  As directed    Do not lift more than 5 pounds. No excessive bending or twisting.   Lifting restrictions   Complete by:  As directed    Do not lift more than 5 pounds. No excessive bending or twisting.   May shower / Bathe   Complete by:  As directed    Remove the dressing for 3 days after surgery.  You may shower, but leave the incision alone.   May shower / Bathe   Complete by:  As directed    Remove the dressing for 3 days after surgery.  You may shower, but leave the incision alone.   Remove dressing in 48 hours   Complete by:  As directed    Your stitches are under the  scan and will dissolve by themselves. The Steri-Strips will fall off after you take a few showers. Do not rub back or pick at the wound, Leave the wound alone.   Remove dressing in 48 hours   Complete by:  As directed    Your stitches are under the scan and will dissolve by themselves. The Steri-Strips will fall off after you take a few showers. Do not rub back or pick at the wound, Leave the wound alone.     Allergies as of 04/13/2018      Reactions   Other Swelling   A bandage/gauze that was used on lower extremity wound at [facility] health, unsure type- Pt states her foot turned red and swelled up   Penicillins Swelling, Rash   Has patient had a PCN reaction causing immediate rash, facial/tongue/throat swelling, SOB or lightheadedness with hypotension: Unknown Has patient had a PCN reaction causing severe rash involving mucus membranes or skin necrosis: Unknown Has patient had a PCN reaction that required hospitalization: No Has patient had a PCN reaction occurring within the last 10 years: No If all of the above answers are "NO", then may proceed with Cephalosporin use.   Sulfa Antibiotics Itching, Swelling   Codeine Nausea Only   Hydrocodone-acetaminophen Other (See Comments)   Causes hallucinations   Naproxen Sodium Swelling, Other (See Comments)   (ALEVE) Red Man Syndrome   Tape Rash, Other (See Comments)   BURNS SKIN --PAPER TAPE IS OKAY      Medication List    STOP taking these medications   oxyCODONE-acetaminophen 5-325 MG tablet Commonly known as:  PERCOCET/ROXICET     TAKE these medications   baclofen 10 MG tablet Commonly known as:  LIORESAL Take 10 mg by mouth daily as needed for muscle spasms.   cyanocobalamin 1000 MCG/ML injection Commonly known as:  (VITAMIN B-12) Inject 1,000 mcg into the muscle every 30 (thirty) days.   docusate sodium 100 MG capsule Commonly known as:  COLACE Take 1 capsule (100 mg total) by mouth 2 (two) times daily.   DULoxetine 60 MG  capsule Commonly known as:  CYMBALTA Take 60 mg by mouth at bedtime.   estrogen (conjugated)-medroxyprogesterone 0.625-2.5 MG tablet Commonly known as:  PREMPRO Take 1 tablet by mouth daily.   febuxostat 40 MG tablet Commonly known as:  ULORIC Take 40 mg by mouth at bedtime.   furosemide 40 MG tablet Commonly known as:  LASIX Take 40-80 mg by mouth  daily.   LYRICA 25 MG capsule Generic drug:  pregabalin Take 25 mg by mouth at bedtime.   Oxycodone HCl 10 MG Tabs Take 1 tablet (10 mg total) by mouth every 4 (four) hours as needed for severe pain ((score 7 to 10)).   pantoprazole 40 MG tablet Commonly known as:  PROTONIX Take 40 mg by mouth daily before breakfast.   predniSONE 5 MG tablet Commonly known as:  DELTASONE Take 5 mg by mouth every evening.   SYNTHROID 150 MCG tablet Generic drug:  levothyroxine Take 150 mcg by mouth daily before breakfast.   telmisartan 20 MG tablet Commonly known as:  MICARDIS Take 20 mg by mouth at bedtime.        Signed: Ophelia Charter 04/13/2018, 6:42 AM

## 2018-04-13 NOTE — Discharge Instructions (Signed)

## 2018-04-13 NOTE — Progress Notes (Signed)
Pt doing well. Pt and husband given D/C instructions, Rx's were sent to pharmacy. Pt's incision is clean and dry with no sign of infection. Pt's IV was removed prior to D/C. Pt D/C'd home via wheelchair @ 0920 per MD order. Pt is stable @ D/C and has no other needs at this time. Holli Humbles, RN

## 2018-04-13 NOTE — Evaluation (Signed)
Physical Therapy Evaluation Patient Details Name: Carolyn Robertson MRN: [id number] DOB: 12/05/1945 Today's Date: 04/13/2018   History of Present Illness  Pt s/p right L4-5 intervertebral discectomy using micro-dissection.  Clinical Impression  Patient evaluated by Physical Therapy with no further acute PT needs identified. All education has been completed and the patient has no further questions. Patient is at baseline for functional mobility. Requiring supervision for ambulating without a device. Recommended using a cane, especially for outdoor ambulation and negotiating curbs/steps. Practiced climbing 12 steps forwards and sideways with bilateral hand support; patient requiring less assist with descending sideways. See below for any follow-up Physical Therapy or equipment needs. PT is signing off. Thank you for this referral.     Follow Up Recommendations No PT follow up;Supervision for mobility/OOB    Equipment Recommendations  None recommended by PT    Recommendations for Other Services       Precautions / Restrictions Precautions Precautions: Back;Fall((back for comfort)) Precaution Booklet Issued: No Restrictions Weight Bearing Restrictions: No      Mobility  Bed Mobility Overal bed mobility: Modified Independent                Transfers Overall transfer level: Modified independent                  Ambulation/Gait Ambulation/Gait assistance: Supervision Gait Distance (Feet): 120 Feet Assistive device: None Gait Pattern/deviations: Wide base of support;Decreased stride length Gait velocity: decreased   General Gait Details: patient tending to "furniture walk" occasionally and mildly unsteady, requiring supervision for safety.   Stairs Stairs: Yes Stairs assistance: Min assist Stair Management: One rail Right;Sideways;Forwards Number of Stairs: 12 General stair comments: patient ascending with right railing and increased trunk flexion, with up to  min assist provided. recommended descending sideways with bilateral hand support and patient requiring min guard.  Wheelchair Mobility    Modified Rankin (Stroke Patients Only)       Balance Overall balance assessment: Mild deficits observed, not formally tested                                           Pertinent Vitals/Pain Pain Assessment: Faces Faces Pain Scale: Hurts a little bit Pain Location: surgical site, right knee (chronic) Pain Descriptors / Indicators: Sore Pain Intervention(s): Monitored during session    Home Living Family/patient expects to be discharged to:: Private residence Living Arrangements: Spouse/significant other Available Help at Discharge: Family Type of Home: House Home Access: Stairs to enter Entrance Stairs-Rails: Left Entrance Stairs-Number of Steps: 13 Home Layout: One level Home Equipment: Cane - single point      Prior Function Level of Independence: Needs assistance   Gait / Transfers Assistance Needed: husband assists patient step over tub and patient uses a cane "as needed to step over curbs."  ADL's / Homemaking Assistance Needed: independent with ADL's        Hand Dominance        Extremity/Trunk Assessment   Upper Extremity Assessment Upper Extremity Assessment: Overall WFL for tasks assessed    Lower Extremity Assessment Lower Extremity Assessment: RLE deficits/detail RLE Deficits / Details: history of arthritis. MMT: hip flexion 3+5, knee extension 4/5. increased swelling noted in foot and patient with lateral wound on distal leg (from her dog scratching her several weeks ago).       Communication   Communication: No difficulties  Cognition Arousal/Alertness:  Awake/alert Behavior During Therapy: WFL for tasks assessed/performed Overall Cognitive Status: Within Functional Limits for tasks assessed                                        General Comments      Exercises Other  Exercises Other Exercises: Reviewed seated hip flexion marching and supine hip abduction for proximal strengthening for right knee pain related to arthritis   Assessment/Plan    PT Assessment Patent does not need any further PT services  PT Problem List         PT Treatment Interventions      PT Goals (Current goals can be found in the Care Plan section)  Acute Rehab PT Goals Patient Stated Goal: decreased right knee pain PT Goal Formulation: All assessment and education complete, DC therapy    Frequency     Barriers to discharge        Co-evaluation               AM-PAC PT "6 Clicks" Daily Activity  Outcome Measure Difficulty turning over in bed (including adjusting bedclothes, sheets and blankets)?: A Little Difficulty moving from lying on back to sitting on the side of the bed? : A Little Difficulty sitting down on and standing up from a chair with arms (e.g., wheelchair, bedside commode, etc,.)?: None Help needed moving to and from a bed to chair (including a wheelchair)?: A Little Help needed walking in hospital room?: A Little Help needed climbing 3-5 steps with a railing? : A Little 6 Click Score: 19    End of Session Equipment Utilized During Treatment: Gait belt Activity Tolerance: Patient tolerated treatment well Patient left: in bed;with call bell/phone within reach;with family/visitor present Nurse Communication: Mobility status PT Visit Diagnosis: Unsteadiness on feet (R26.81);Muscle weakness (generalized) (M62.81);Difficulty in walking, not elsewhere classified (R26.2)    Time: 3383-2919 PT Time Calculation (min) (ACUTE ONLY): 32 min   Charges:   PT Evaluation $PT Eval Low Complexity: 1 Low PT Treatments $Therapeutic Exercise: 8-22 mins   PT G Codes:       Ellamae Sia, PT, DPT Acute Rehabilitation Services  Pager: [phone]   Willy Eddy 04/13/2018, 9:49 AM

## 2018-04-17 DIAGNOSIS — S81801A Unspecified open wound, right lower leg, initial encounter: Secondary | ICD-10-CM | POA: Diagnosis not present

## 2018-04-17 DIAGNOSIS — L97912 Non-pressure chronic ulcer of unspecified part of right lower leg with fat layer exposed: Secondary | ICD-10-CM | POA: Diagnosis not present

## 2018-04-17 DIAGNOSIS — S81801D Unspecified open wound, right lower leg, subsequent encounter: Secondary | ICD-10-CM | POA: Diagnosis not present

## 2018-04-17 DIAGNOSIS — L97911 Non-pressure chronic ulcer of unspecified part of right lower leg limited to breakdown of skin: Secondary | ICD-10-CM | POA: Diagnosis not present

## 2018-04-17 DIAGNOSIS — I872 Venous insufficiency (chronic) (peripheral): Secondary | ICD-10-CM | POA: Diagnosis not present

## 2018-04-17 DIAGNOSIS — L03115 Cellulitis of right lower limb: Secondary | ICD-10-CM | POA: Diagnosis not present

## 2018-04-17 DIAGNOSIS — R600 Localized edema: Secondary | ICD-10-CM | POA: Diagnosis not present

## 2018-04-17 DIAGNOSIS — I87302 Chronic venous hypertension (idiopathic) without complications of left lower extremity: Secondary | ICD-10-CM | POA: Diagnosis not present

## 2018-04-17 DIAGNOSIS — N184 Chronic kidney disease, stage 4 (severe): Secondary | ICD-10-CM | POA: Diagnosis not present

## 2018-04-18 ENCOUNTER — Ambulatory Visit (HOSPITAL_COMMUNITY)
Admission: RE | Admit: 2018-04-18 | Discharge: 2018-04-18 | Disposition: A | Discharge status: Home / Self Care | Payer: Medicare Other | Source: Ambulatory Visit | Attending: Vascular Surgery | Admitting: Vascular Surgery

## 2018-04-18 ENCOUNTER — Other Ambulatory Visit (HOSPITAL_COMMUNITY): Payer: Self-pay | Admitting: Neurosurgery

## 2018-04-18 DIAGNOSIS — M7989 Other specified soft tissue disorders: Secondary | ICD-10-CM

## 2018-04-23 DIAGNOSIS — M5431 Sciatica, right side: Secondary | ICD-10-CM | POA: Diagnosis not present

## 2018-05-21 ENCOUNTER — Other Ambulatory Visit: Payer: Self-pay | Admitting: Student

## 2018-05-21 DIAGNOSIS — M7061 Trochanteric bursitis, right hip: Secondary | ICD-10-CM

## 2018-05-24 ENCOUNTER — Ambulatory Visit
Admission: RE | Admit: 2018-05-24 | Discharge: 2018-05-24 | Disposition: A | Discharge status: Home / Self Care | Payer: Medicare Other | Source: Ambulatory Visit | Attending: Student | Admitting: Student

## 2018-05-24 DIAGNOSIS — M7061 Trochanteric bursitis, right hip: Secondary | ICD-10-CM

## 2018-05-24 DIAGNOSIS — M25551 Pain in right hip: Secondary | ICD-10-CM | POA: Diagnosis not present

## 2018-05-24 MED ORDER — IOPAMIDOL (ISOVUE-M 200) INJECTION 41%
1.0000 mL | Freq: Once | INTRAMUSCULAR | Status: AC
  Administered 2018-05-24: 1 mL via INTRA_ARTICULAR

## 2018-05-24 MED ORDER — METHYLPREDNISOLONE ACETATE 40 MG/ML INJ SUSP (RADIOLOG
120.0000 mg | Freq: Once | INTRAMUSCULAR | Status: AC
  Administered 2018-05-24: 120 mg via INTRA_ARTICULAR

## 2018-06-17 DIAGNOSIS — G459 Transient cerebral ischemic attack, unspecified: Secondary | ICD-10-CM

## 2018-06-17 HISTORY — DX: Transient cerebral ischemic attack, unspecified: G45.9

## 2018-06-20 DIAGNOSIS — F329 Major depressive disorder, single episode, unspecified: Secondary | ICD-10-CM | POA: Diagnosis not present

## 2018-06-20 DIAGNOSIS — N184 Chronic kidney disease, stage 4 (severe): Secondary | ICD-10-CM | POA: Diagnosis not present

## 2018-06-20 DIAGNOSIS — Z6828 Body mass index (BMI) 28.0-28.9, adult: Secondary | ICD-10-CM | POA: Diagnosis not present

## 2018-06-20 DIAGNOSIS — K219 Gastro-esophageal reflux disease without esophagitis: Secondary | ICD-10-CM | POA: Diagnosis not present

## 2018-06-20 DIAGNOSIS — M1990 Unspecified osteoarthritis, unspecified site: Secondary | ICD-10-CM | POA: Diagnosis not present

## 2018-06-20 DIAGNOSIS — E8770 Fluid overload, unspecified: Secondary | ICD-10-CM | POA: Diagnosis not present

## 2018-06-20 DIAGNOSIS — Z79899 Other long term (current) drug therapy: Secondary | ICD-10-CM | POA: Diagnosis not present

## 2018-06-20 DIAGNOSIS — N390 Urinary tract infection, site not specified: Secondary | ICD-10-CM | POA: Diagnosis not present

## 2018-06-20 DIAGNOSIS — G9529 Other cord compression: Secondary | ICD-10-CM | POA: Diagnosis not present

## 2018-06-20 DIAGNOSIS — M109 Gout, unspecified: Secondary | ICD-10-CM | POA: Diagnosis not present

## 2018-06-20 DIAGNOSIS — I129 Hypertensive chronic kidney disease with stage 1 through stage 4 chronic kidney disease, or unspecified chronic kidney disease: Secondary | ICD-10-CM | POA: Diagnosis not present

## 2018-06-20 DIAGNOSIS — N2581 Secondary hyperparathyroidism of renal origin: Secondary | ICD-10-CM | POA: Diagnosis not present

## 2018-06-21 DIAGNOSIS — N390 Urinary tract infection, site not specified: Secondary | ICD-10-CM | POA: Diagnosis not present

## 2018-06-21 DIAGNOSIS — N184 Chronic kidney disease, stage 4 (severe): Secondary | ICD-10-CM | POA: Diagnosis not present

## 2018-06-24 ENCOUNTER — Inpatient Hospital Stay (HOSPITAL_COMMUNITY)
Admission: EM | Admit: 2018-06-24 | Discharge: 2018-06-26 | DRG: 690 | Disposition: A | Discharge status: Home / Self Care | Payer: Medicare Other | Attending: Internal Medicine | Admitting: Internal Medicine

## 2018-06-24 ENCOUNTER — Emergency Department (HOSPITAL_COMMUNITY): Payer: Medicare Other

## 2018-06-24 ENCOUNTER — Other Ambulatory Visit: Payer: Self-pay

## 2018-06-24 ENCOUNTER — Encounter (HOSPITAL_COMMUNITY): Payer: Self-pay | Admitting: Emergency Medicine

## 2018-06-24 DIAGNOSIS — R29810 Facial weakness: Secondary | ICD-10-CM | POA: Diagnosis present

## 2018-06-24 DIAGNOSIS — R4702 Dysphasia: Secondary | ICD-10-CM | POA: Diagnosis present

## 2018-06-24 DIAGNOSIS — G459 Transient cerebral ischemic attack, unspecified: Secondary | ICD-10-CM | POA: Diagnosis not present

## 2018-06-24 DIAGNOSIS — I7389 Other specified peripheral vascular diseases: Secondary | ICD-10-CM | POA: Diagnosis present

## 2018-06-24 DIAGNOSIS — E872 Acidosis, unspecified: Secondary | ICD-10-CM

## 2018-06-24 DIAGNOSIS — M1711 Unilateral primary osteoarthritis, right knee: Secondary | ICD-10-CM | POA: Diagnosis not present

## 2018-06-24 DIAGNOSIS — R4701 Aphasia: Secondary | ICD-10-CM | POA: Diagnosis present

## 2018-06-24 DIAGNOSIS — I959 Hypotension, unspecified: Secondary | ICD-10-CM | POA: Diagnosis not present

## 2018-06-24 DIAGNOSIS — I639 Cerebral infarction, unspecified: Secondary | ICD-10-CM | POA: Diagnosis not present

## 2018-06-24 DIAGNOSIS — Z91048 Other nonmedicinal substance allergy status: Secondary | ICD-10-CM

## 2018-06-24 DIAGNOSIS — F419 Anxiety disorder, unspecified: Secondary | ICD-10-CM | POA: Diagnosis present

## 2018-06-24 DIAGNOSIS — I361 Nonrheumatic tricuspid (valve) insufficiency: Secondary | ICD-10-CM | POA: Diagnosis not present

## 2018-06-24 DIAGNOSIS — E039 Hypothyroidism, unspecified: Secondary | ICD-10-CM | POA: Diagnosis present

## 2018-06-24 DIAGNOSIS — J323 Chronic sphenoidal sinusitis: Secondary | ICD-10-CM | POA: Diagnosis present

## 2018-06-24 DIAGNOSIS — M542 Cervicalgia: Secondary | ICD-10-CM | POA: Diagnosis present

## 2018-06-24 DIAGNOSIS — K219 Gastro-esophageal reflux disease without esophagitis: Secondary | ICD-10-CM | POA: Diagnosis present

## 2018-06-24 DIAGNOSIS — E079 Disorder of thyroid, unspecified: Secondary | ICD-10-CM | POA: Diagnosis present

## 2018-06-24 DIAGNOSIS — Z888 Allergy status to other drugs, medicaments and biological substances status: Secondary | ICD-10-CM

## 2018-06-24 DIAGNOSIS — Z79890 Hormone replacement therapy (postmenopausal): Secondary | ICD-10-CM

## 2018-06-24 DIAGNOSIS — G9340 Encephalopathy, unspecified: Secondary | ICD-10-CM | POA: Diagnosis not present

## 2018-06-24 DIAGNOSIS — I129 Hypertensive chronic kidney disease with stage 1 through stage 4 chronic kidney disease, or unspecified chronic kidney disease: Secondary | ICD-10-CM | POA: Diagnosis not present

## 2018-06-24 DIAGNOSIS — N184 Chronic kidney disease, stage 4 (severe): Secondary | ICD-10-CM | POA: Diagnosis not present

## 2018-06-24 DIAGNOSIS — R402142 Coma scale, eyes open, spontaneous, at arrival to emergency department: Secondary | ICD-10-CM | POA: Diagnosis present

## 2018-06-24 DIAGNOSIS — N390 Urinary tract infection, site not specified: Secondary | ICD-10-CM | POA: Diagnosis not present

## 2018-06-24 DIAGNOSIS — G8929 Other chronic pain: Secondary | ICD-10-CM | POA: Diagnosis present

## 2018-06-24 DIAGNOSIS — Z87442 Personal history of urinary calculi: Secondary | ICD-10-CM | POA: Diagnosis not present

## 2018-06-24 DIAGNOSIS — R402362 Coma scale, best motor response, obeys commands, at arrival to emergency department: Secondary | ICD-10-CM | POA: Diagnosis present

## 2018-06-24 DIAGNOSIS — R29702 NIHSS score 2: Secondary | ICD-10-CM | POA: Diagnosis present

## 2018-06-24 DIAGNOSIS — N189 Chronic kidney disease, unspecified: Secondary | ICD-10-CM | POA: Diagnosis not present

## 2018-06-24 DIAGNOSIS — M170 Bilateral primary osteoarthritis of knee: Secondary | ICD-10-CM | POA: Diagnosis present

## 2018-06-24 DIAGNOSIS — J45909 Unspecified asthma, uncomplicated: Secondary | ICD-10-CM | POA: Diagnosis present

## 2018-06-24 DIAGNOSIS — Z882 Allergy status to sulfonamides status: Secondary | ICD-10-CM

## 2018-06-24 DIAGNOSIS — R4781 Slurred speech: Secondary | ICD-10-CM | POA: Diagnosis not present

## 2018-06-24 DIAGNOSIS — Z79899 Other long term (current) drug therapy: Secondary | ICD-10-CM

## 2018-06-24 DIAGNOSIS — Z79891 Long term (current) use of opiate analgesic: Secondary | ICD-10-CM

## 2018-06-24 DIAGNOSIS — Z8673 Personal history of transient ischemic attack (TIA), and cerebral infarction without residual deficits: Secondary | ICD-10-CM

## 2018-06-24 DIAGNOSIS — R402252 Coma scale, best verbal response, oriented, at arrival to emergency department: Secondary | ICD-10-CM | POA: Diagnosis present

## 2018-06-24 DIAGNOSIS — Z880 Allergy status to penicillin: Secondary | ICD-10-CM

## 2018-06-24 LAB — DIFFERENTIAL
Abs Immature Granulocytes: 0 10*3/uL (ref 0.0–0.1)
Basophils Absolute: 0.1 10*3/uL (ref 0.0–0.1)
Basophils Relative: 1 %
EOS ABS: 0.1 10*3/uL (ref 0.0–0.7)
Eosinophils Relative: 1 %
IMMATURE GRANULOCYTES: 0 %
LYMPHS ABS: 1.6 10*3/uL (ref 0.7–4.0)
LYMPHS PCT: 16 %
MONO ABS: 0.9 10*3/uL (ref 0.1–1.0)
MONOS PCT: 9 %
Neutro Abs: 7.2 10*3/uL (ref 1.7–7.7)
Neutrophils Relative %: 73 %

## 2018-06-24 LAB — URINALYSIS, ROUTINE W REFLEX MICROSCOPIC
BILIRUBIN URINE: NEGATIVE
Glucose, UA: NEGATIVE mg/dL
Ketones, ur: NEGATIVE mg/dL
Nitrite: NEGATIVE
PH: 5 (ref 5.0–8.0)
Protein, ur: 30 mg/dL — AB
SPECIFIC GRAVITY, URINE: 1.012 (ref 1.005–1.030)
WBC, UA: >50 WBC/hpf — ABNORMAL HIGH (ref 0–5)

## 2018-06-24 LAB — COMPREHENSIVE METABOLIC PANEL
ALBUMIN: 2.9 g/dL — AB (ref 3.5–5.0)
ALK PHOS: 71 U/L (ref 38–126)
ALT: 11 U/L (ref 0–44)
ANION GAP: 10 (ref 5–15)
AST: 17 U/L (ref 15–41)
BILIRUBIN TOTAL: 0.5 mg/dL (ref 0.3–1.2)
BUN: 33 mg/dL — ABNORMAL HIGH (ref 8–23)
CALCIUM: 8.5 mg/dL — AB (ref 8.9–10.3)
CO2: 15 mmol/L — ABNORMAL LOW (ref 22–32)
Chloride: 116 mmol/L — ABNORMAL HIGH (ref 98–111)
Creatinine, Ser: 2.78 mg/dL — ABNORMAL HIGH (ref 0.44–1.00)
GFR calc Af Amer: 18 mL/min — ABNORMAL LOW (ref >60)
GFR calc non Af Amer: 16 mL/min — ABNORMAL LOW (ref >60)
Glucose, Bld: 113 mg/dL — ABNORMAL HIGH (ref 70–99)
POTASSIUM: 4 mmol/L (ref 3.5–5.1)
Sodium: 141 mmol/L (ref 135–145)
TOTAL PROTEIN: 5.6 g/dL — AB (ref 6.5–8.1)

## 2018-06-24 LAB — CBC
HCT: 39.4 % (ref 36.0–46.0)
Hemoglobin: 12.2 g/dL (ref 12.0–15.0)
MCH: 30.1 pg (ref 26.0–34.0)
MCHC: 31 g/dL (ref 30.0–36.0)
MCV: 97.3 fL (ref 78.0–100.0)
PLATELETS: 437 10*3/uL — AB (ref 150–400)
RBC: 4.05 MIL/uL (ref 3.87–5.11)
RDW: 14.9 % (ref 11.5–15.5)
WBC: 9.9 10*3/uL (ref 4.0–10.5)

## 2018-06-24 LAB — APTT: aPTT: 32 seconds (ref 24–36)

## 2018-06-24 LAB — I-STAT TROPONIN, ED: Troponin i, poc: 0.06 ng/mL (ref 0.00–0.08)

## 2018-06-24 LAB — PROTIME-INR
INR: 1.06
PROTHROMBIN TIME: 13.7 s (ref 11.4–15.2)

## 2018-06-24 LAB — CBG MONITORING, ED: GLUCOSE-CAPILLARY: 92 mg/dL (ref 70–99)

## 2018-06-24 MED ORDER — PREGABALIN 25 MG PO CAPS
25.0000 mg | ORAL_CAPSULE | Freq: Three times a day (TID) | ORAL | Status: DC
  Administered 2018-06-24 – 2018-06-26 (×5): 25 mg via ORAL
  Filled 2018-06-24 (×5): qty 1

## 2018-06-24 MED ORDER — FEBUXOSTAT 40 MG PO TABS
40.0000 mg | ORAL_TABLET | Freq: Every day | ORAL | Status: DC
  Administered 2018-06-24 – 2018-06-25 (×2): 40 mg via ORAL
  Filled 2018-06-24 (×3): qty 1

## 2018-06-24 MED ORDER — CONJ ESTROG-MEDROXYPROGEST ACE 0.625-2.5 MG PO TABS: 1.0000 | ORAL_TABLET | Freq: Every day | ORAL | Status: DC

## 2018-06-24 MED ORDER — ACETAMINOPHEN 650 MG RE SUPP: 650.0000 mg | RECTAL | Status: DC | PRN

## 2018-06-24 MED ORDER — ACETAMINOPHEN 325 MG PO TABS: 650.0000 mg | ORAL_TABLET | ORAL | Status: DC | PRN

## 2018-06-24 MED ORDER — HYDROCODONE-ACETAMINOPHEN 5-325 MG PO TABS
1.0000 | ORAL_TABLET | ORAL | Status: DC | PRN
  Administered 2018-06-24 – 2018-06-25 (×2): 1 via ORAL
  Filled 2018-06-24 (×2): qty 1

## 2018-06-24 MED ORDER — ACETAMINOPHEN 160 MG/5ML PO SOLN: 650.0000 mg | ORAL | Status: DC | PRN

## 2018-06-24 MED ORDER — FUROSEMIDE 40 MG PO TABS
40.0000 mg | ORAL_TABLET | Freq: Every day | ORAL | Status: DC
  Administered 2018-06-24 – 2018-06-25 (×2): 40 mg via ORAL
  Filled 2018-06-24 (×2): qty 1

## 2018-06-24 MED ORDER — SODIUM CHLORIDE 0.9 % IV SOLN
INTRAVENOUS | Status: DC
  Administered 2018-06-24 – 2018-06-25 (×2): via INTRAVENOUS

## 2018-06-24 MED ORDER — SENNOSIDES-DOCUSATE SODIUM 8.6-50 MG PO TABS
1.0000 | ORAL_TABLET | Freq: Every day | ORAL | Status: DC
  Administered 2018-06-24 – 2018-06-25 (×2): 1 via ORAL
  Filled 2018-06-24 (×2): qty 1

## 2018-06-24 MED ORDER — STROKE: EARLY STAGES OF RECOVERY BOOK
Freq: Once | Status: AC
  Administered 2018-06-25: 07:00:00

## 2018-06-24 MED ORDER — IRBESARTAN 150 MG PO TABS
150.0000 mg | ORAL_TABLET | Freq: Every day | ORAL | Status: DC
  Administered 2018-06-24 – 2018-06-25 (×2): 150 mg via ORAL
  Filled 2018-06-24 (×2): qty 1

## 2018-06-24 MED ORDER — ENOXAPARIN SODIUM 30 MG/0.3ML ~~LOC~~ SOLN
30.0000 mg | SUBCUTANEOUS | Status: DC
  Administered 2018-06-24 – 2018-06-25 (×2): 30 mg via SUBCUTANEOUS
  Filled 2018-06-24 (×2): qty 0.3

## 2018-06-24 MED ORDER — PANTOPRAZOLE SODIUM 40 MG PO TBEC
40.0000 mg | DELAYED_RELEASE_TABLET | Freq: Every day | ORAL | Status: DC
  Administered 2018-06-25 – 2018-06-26 (×2): 40 mg via ORAL
  Filled 2018-06-24 (×2): qty 1

## 2018-06-24 MED ORDER — DULOXETINE HCL 60 MG PO CPEP
60.0000 mg | ORAL_CAPSULE | Freq: Every day | ORAL | Status: DC
  Administered 2018-06-24 – 2018-06-25 (×2): 60 mg via ORAL
  Filled 2018-06-24 (×2): qty 1

## 2018-06-24 MED ORDER — FAMOTIDINE 20 MG PO TABS
20.0000 mg | ORAL_TABLET | Freq: Every day | ORAL | Status: DC
  Administered 2018-06-24 – 2018-06-26 (×3): 20 mg via ORAL
  Filled 2018-06-24 (×3): qty 1

## 2018-06-24 MED ORDER — DOXYCYCLINE HYCLATE 100 MG PO TABS
100.0000 mg | ORAL_TABLET | Freq: Two times a day (BID) | ORAL | Status: DC
  Administered 2018-06-24 – 2018-06-25 (×2): 100 mg via ORAL
  Filled 2018-06-24 (×2): qty 1

## 2018-06-24 MED ORDER — LEVOTHYROXINE SODIUM 75 MCG PO TABS
150.0000 ug | ORAL_TABLET | Freq: Every day | ORAL | Status: DC
  Administered 2018-06-25 – 2018-06-26 (×2): 150 ug via ORAL
  Filled 2018-06-24 (×2): qty 2

## 2018-06-24 NOTE — ED Notes (Signed)
Attempted to call report x 1  

## 2018-06-24 NOTE — ED Provider Notes (Signed)
[facility] EMERGENCY DEPARTMENT Provider Note   CSN: [id number] Arrival date & time: 06/24/18  1218     History   Chief Complaint Chief Complaint  Patient presents with  . Aphasia  . Facial Droop    HPI Carolyn Robertson is a 72 y.o. female.  HPI Pt noticed yesterday she was having some trouble "losing her words".  She did not think too much of it because she changed her medication, lyrica and thought that it could be related.  Pt states today the symptoms were worse and she could not form sentences.  She seems to be saying the wrong words.  It is taking more effort for her to construct the sentence.  No arm or leg weakness.  NO trouble with vision.  Family states they noticed the speech difficulty.  Something is definitely not normal. Past Medical History:  Diagnosis Date  . Anxiety   . Arthritis   . 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 GERD (gastroesophageal reflux disease)   . History of kidney stones   . Hypertension   . Hypothyroidism   . Incontinent of urine   . Renal disorder    stage IV kidney disease  . Thyroid disease     Patient Active Problem List   Diagnosis Date Noted  . Lumbar herniated disc 04/12/2018    Past Surgical History:  Procedure Laterality Date  . CHOLECYSTECTOMY    . DILATION AND CURETTAGE OF UTERUS    . EYE SURGERY Right    retina surgery  . KNEE SURGERY    . LUMBAR LAMINECTOMY/DECOMPRESSION MICRODISCECTOMY Right 04/12/2018   Procedure: MICRODISCECTOMY LUMBAR FOUR- LUMBAR FIVE;  Surgeon: Newman Pies, MD;  Location: [facility];  Service: Neurosurgery;  Laterality: Right;  MICRODISCECTOMY LUMBAR FOUR- LUMBAR FIVE  . TONSILLECTOMY       OB History   None      Home Medications    Prior to Admission medications   Medication Sig Start Date End Date Taking? Authorizing Provider  baclofen (LIORESAL) 10 MG tablet Take 10 mg by mouth daily as needed for muscle spasms.   Yes [provider]  DULoxetine (CYMBALTA) 60 MG capsule Take 60 mg by mouth at bedtime.   Yes [provider]  estrogen, conjugated,-medroxyprogesterone (PREMPRO) 0.625-2.5 MG per tablet Take 1 tablet by mouth daily.   Yes [provider]  febuxostat (ULORIC) 40 MG tablet Take 40 mg by mouth at bedtime.    Yes [provider]  furosemide (LASIX) 40 MG tablet Take 40 mg by mouth daily.    Yes [provider]  HYDROcodone-acetaminophen (NORCO/VICODIN) 5-325 MG tablet Take 1 tablet by mouth every 4 (four) hours as needed for pain. Every 4-6 hrs as needed 06/20/18  Yes [provider]  LYRICA 25 MG capsule Take 25 mg by mouth 3 (three) times daily.  03/17/18  Yes [provider]  oxybutynin (DITROPAN-XL) 5 MG 24 hr tablet Take 5 mg by mouth at bedtime. 06/20/18  Yes [provider]  pantoprazole (PROTONIX) 40 MG tablet Take 40 mg by mouth daily before breakfast. 02/22/18  Yes [provider]  ranitidine (ZANTAC) 150 MG tablet Take 150 mg by mouth as needed for heartburn.   Yes [provider]  SYNTHROID 150 MCG tablet Take 150 mcg by mouth daily before breakfast. 01/05/18  Yes [provider]  telmisartan (MICARDIS) 20 MG tablet Take 20 mg by mouth at bedtime. 03/06/18  Yes [provider]  traMADol (ULTRAM) 50 MG tablet Take 50 mg by mouth every 8 (eight) hours as needed for pain.   Yes [provider]  cyanocobalamin (,VITAMIN B-12,) 1000 MCG/ML injection Inject 1,000 mcg into the muscle every 30 (thirty) days.    [provider]  docusate sodium (COLACE) 100 MG capsule Take 1 capsule (100 mg total) by mouth 2 (two) times daily. 04/13/18   Newman Pies, MD  oxyCODONE 10 MG TABS Take 1 tablet (10 mg total) by mouth every 4 (four) hours as needed for severe pain ((score 7 to 10)). 04/13/18   Newman Pies, MD    Family History No family history on file.  Social History Social History   Tobacco Use  .  Smoking status: Never Smoker  . Smokeless tobacco: Never Used  Substance Use Topics  . Alcohol use: No    Comment: rarely  . Drug use: No     Allergies   Other; Penicillins; Sulfa antibiotics; Codeine; Hydrocodone-acetaminophen; Naproxen sodium; and Tape   Review of Systems Review of Systems  Genitourinary: Positive for dysuria and hematuria.  All other systems reviewed and are negative.    Physical Exam Updated Vital Signs BP (!) 162/76   Pulse 84   Temp 98.5 F (36.9 C)   Resp 17   Ht 1.626 m (5\' 4" )   Wt 72.6 kg   SpO2 100%   BMI 27.46 kg/m   Physical Exam  Constitutional: She is oriented to person, place, and time. She appears well-developed and well-nourished. No distress.  HENT:  Head: Normocephalic and atraumatic.  Right Ear: External ear normal.  Left Ear: External ear normal.  Mouth/Throat: Oropharynx is clear and moist.  Eyes: Conjunctivae are normal. Right eye exhibits no discharge. Left eye exhibits no discharge. No scleral icterus.  Neck: Neck supple. No tracheal deviation present.  Cardiovascular: Normal rate, regular rhythm and intact distal pulses.  Pulmonary/Chest: Effort normal and breath sounds normal. No stridor. No respiratory distress. She has no wheezes. She has no rales.  Abdominal: Soft. Bowel sounds are normal. She exhibits no distension. There is no tenderness. There is no rebound and no guarding.  Musculoskeletal: She exhibits no edema or tenderness.  Neurological: She is alert and oriented to person, place, and time. She has normal strength. No cranial nerve deficit (No facial droop, extraocular movements intact, tongue midline , aphasia) or sensory deficit. She exhibits normal muscle tone. She displays no seizure activity. Coordination normal.  No pronator drift bilateral upper extrem, able to hold both legs off bed for 5 seconds, sensation intact in all extremities, no visual field cuts, no left or right sided neglect, normal finger-nose  exam bilaterally, no nystagmus noted  Pt is saying the wrong words in sentences   Skin: Skin is warm and dry. No rash noted.  Psychiatric: She has a normal mood and affect.  Nursing note and vitals reviewed.    ED Treatments / Results  Labs (all labs ordered are listed, but only abnormal results are displayed) Labs Reviewed  CBC - Abnormal; Notable for the following components:      Result Value   Platelets 437 (*)    All other components within normal limits  COMPREHENSIVE METABOLIC PANEL - Abnormal; Notable for the following components:   Chloride 116 (*)    CO2 15 (*)    Glucose, Bld 113 (*)    BUN 33 (*)    Creatinine, Ser 2.78 (*)    Calcium 8.5 (*)  Total Protein 5.6 (*)    Albumin 2.9 (*)    GFR calc non Af Amer 16 (*)    GFR calc Af Amer 18 (*)    All other components within normal limits  PROTIME-INR  APTT  DIFFERENTIAL  URINALYSIS, ROUTINE W REFLEX MICROSCOPIC  I-STAT TROPONIN, ED  CBG MONITORING, ED    EKG EKG Interpretation  Date/Time:  Sunday June 24 2018 12:31:08 EDT Ventricular Rate:  106 PR Interval:  130 QRS Duration: 56 QT Interval:  348 QTC Calculation: 462 R Axis:   -5 Text Interpretation:  Sinus tachycardia with Premature supraventricular complexes Otherwise normal ECG Since last tracing rate faster Confirmed by Dorie Rank [phone]) on 06/24/2018 1:05:59 PM   Radiology Ct Head Wo Contrast  Result Date: 06/24/2018 CLINICAL DATA:  Slurred speech.  Expressive aphasia. EXAM: CT HEAD WITHOUT CONTRAST TECHNIQUE: Contiguous axial images were obtained from the base of the skull through the vertex without intravenous contrast. COMPARISON:  Multiple exams, including 02/25/2016 FINDINGS: Brain: Small remote right cerebellar lacunar infarct on image 10/3, unchanged. Small focal fairly sharply defined hypodensity in the right thalamus, 4 mm in diameter, probably a small lacunar infarct. Periventricular white matter and corona radiata hypodensities favor  chronic ischemic microvascular white matter disease. No intracranial hemorrhage, mass lesion, or acute CVA. Vascular: Unremarkable Skull: Unremarkable Sinuses/Orbits: Minimal chronic left maxillary sinusitis. Chronic bilateral sphenoid sinusitis. Other: No supplemental non-categorized findings. IMPRESSION: 1. No definite acute intracranial findings. 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 2. 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 Electronically Signed   By: Van Clines M.D.   On: 06/24/2018 13:37    Procedures Procedures (including critical care time)  Medications Ordered in ED Medications - No data to display   Initial Impression / Assessment and Plan / ED Course  I have reviewed the triage vital signs and the nursing notes.  Pertinent labs & imaging results that were available during my care of the patient were reviewed by me and considered in my medical decision making (see chart for details).  Clinical Course as of Jun 24 1457  Sun Jun 24, 2018  1329 Hemoglobin: 12.2 [JK]  1456 Abs notable for chronic kidney disease with elevated creatinine.  She does have a decreased bicarb.  Non-anion gap metabolic acidosis likely related to her chronic kidney disease   [JK]    Clinical Course User Index [JK] Dorie Rank, MD   Patient presented to the emergency room with complaints of speech difficulties.  Her symptoms are consistent with an aphasia.  No definite abnormalities on CT scan and she does seem to be speaking more clearly but I am concerned about occult stroke versus TIA.  I will consult the medical service to bring her in for further work-up.  Final Clinical Impressions(s) / ED Diagnoses   Final diagnoses:  Aphasia  Chronic kidney disease, unspecified CKD stage  Metabolic acidosis      Dorie Rank, MD 06/24/18 1458

## 2018-06-24 NOTE — Plan of Care (Signed)
  Problem: Education: Goal: Knowledge of General Education information will improve Description: Including pain rating scale, medication(s)/side effects and non-pharmacologic comfort measures Outcome: Progressing   Problem: Clinical Measurements: Goal: Diagnostic test results will improve Outcome: Progressing   Problem: Safety: Goal: Ability to remain free from injury will improve Outcome: Progressing   

## 2018-06-24 NOTE — ED Notes (Signed)
Attempted to give report x2 

## 2018-06-24 NOTE — H&P (Signed)
History and Physical  Carolyn Robertson QQP:[id number] DOB: Nov 08, 1945 DOA: 06/24/2018  Referring physician: Dorie Rank, MD PCP: Burnard Bunting, MD  Outpatient Specialists:  Patient coming from:home & is able to ambulate   Chief Complaint: Difficulty finding words facial droop  HPI: Carolyn Robertson is a 72 y.o. female with medical history significant for bilateral degenerative arthritis of the knee hypertension hypothyroidism chronic kidney disease who presents with difficulty finding her words.,Pt noticed yesterday she was having some trouble "losing her words".  She did not think too much of it because she changed her medication, lyrica and thought that it could be related.  Pt states today the symptoms were worse and she could not form sentences.  She seems to be saying the wrong words.  It is taking more effort for her to construct the sentence.  She stated that this is very frustrating because she likes to talk No arm or leg weakness.  NO trouble with vision.  Family states they noticed the speech difficulty.  Something is definitely not normal.  She also thinks she may have a bladder infection because her urine has been very cloudy During my examination she was able to carry out a good conversation did not notice any slurring of her speech even though she stated that about 50 minutes prior to me coming while she was on the phone with her daughter she was having problem finding the right words.   ED Course: CT head was done that did not show no definite acute intracranial findings some evidence of chronic microvascular white matter disease and small remote lacunar infarct in the right thalamus and right cerebrum.  It does show some left maxillary and sphenoid sinusitis  Review of Systems:  . Pt complains of loss of words and ability to make sentences, dysuria hematuria she thinks she may have a bladder infection.  Bilateral knee pain she uses a walking cane  Pt denies any chest pain fever  diarrhea rhinorrhea abdominal pain headache review of systems are otherwise negative   Past Medical History:  Diagnosis Date  . Anxiety   . Arthritis   . 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 GERD (gastroesophageal reflux disease)   . History of kidney stones   . Hypertension   . Hypothyroidism   . Incontinent of urine   . Renal disorder    stage IV kidney disease  . Thyroid disease    Past Surgical History:  Procedure Laterality Date  . CHOLECYSTECTOMY    . DILATION AND CURETTAGE OF UTERUS    . EYE SURGERY Right    retina surgery  . KNEE SURGERY    . LUMBAR LAMINECTOMY/DECOMPRESSION MICRODISCECTOMY Right 04/12/2018   Procedure: MICRODISCECTOMY LUMBAR FOUR- LUMBAR FIVE;  Surgeon: Newman Pies, MD;  Location: [place];  Service: Neurosurgery;  Laterality: Right;  MICRODISCECTOMY LUMBAR FOUR- LUMBAR FIVE  . TONSILLECTOMY      Social History:  reports that she has never smoked. She has never used smokeless tobacco. She reports that she does not drink alcohol or use drugs.   Allergies  Allergen Reactions  . Other Swelling    A bandage/gauze that was used on lower extremity wound at [facility] health, unsure type- Pt states her foot turned red and swelled up  . Penicillins Swelling and Rash    Has patient had a PCN reaction causing immediate rash, facial/tongue/throat swelling, SOB or lightheadedness with hypotension: Unknown Has patient had a PCN reaction causing severe rash  involving mucus membranes or skin necrosis: Unknown Has patient had a PCN reaction that required hospitalization: No Has patient had a PCN reaction occurring within the last 10 years: No If all of the above answers are "NO", then may proceed with Cephalosporin use.   . Sulfa Antibiotics Itching and Swelling  . Codeine Nausea Only  . Hydrocodone-Acetaminophen Other (See Comments)    Causes hallucinations  . Naproxen Sodium Swelling and Other (See Comments)    (ALEVE) Red Man  Syndrome  . Tape Rash and Other (See Comments)    BURNS SKIN --PAPER TAPE IS OKAY    No family history on file.    Prior to Admission medications   Medication Sig Start Date End Date Taking? Authorizing Provider  baclofen (LIORESAL) 10 MG tablet Take 10 mg by mouth daily as needed for muscle spasms.   Yes [provider]  cyanocobalamin (,VITAMIN B-12,) 1000 MCG/ML injection Inject 1,000 mcg into the muscle every 30 (thirty) days.   Yes [provider]  DULoxetine (CYMBALTA) 60 MG capsule Take 60 mg by mouth at bedtime.   Yes [provider]  estrogen, conjugated,-medroxyprogesterone (PREMPRO) 0.625-2.5 MG per tablet Take 1 tablet by mouth daily.   Yes [provider]  febuxostat (ULORIC) 40 MG tablet Take 40 mg by mouth at bedtime.    Yes [provider]  furosemide (LASIX) 40 MG tablet Take 40 mg by mouth daily.    Yes [provider]  HYDROcodone-acetaminophen (NORCO/VICODIN) 5-325 MG tablet Take 1 tablet by mouth every 4 (four) hours as needed for pain. Every 4-6 hrs as needed 06/20/18  Yes [provider]  LYRICA 25 MG capsule Take 25 mg by mouth 3 (three) times daily.  03/17/18  Yes [provider]  oxybutynin (DITROPAN-XL) 5 MG 24 hr tablet Take 5 mg by mouth at bedtime. 06/20/18  Yes [provider]  pantoprazole (PROTONIX) 40 MG tablet Take 40 mg by mouth daily before breakfast. 02/22/18  Yes [provider]  ranitidine (ZANTAC) 150 MG tablet Take 150 mg by mouth as needed for heartburn.   Yes [provider]  SYNTHROID 150 MCG tablet Take 150 mcg by mouth daily before breakfast. 01/05/18  Yes [provider]  telmisartan (MICARDIS) 20 MG tablet Take 20 mg by mouth at bedtime. 03/06/18  Yes [provider]  traMADol (ULTRAM) 50 MG tablet Take 50 mg by mouth every 8 (eight) hours as needed for pain.   Yes [provider]  docusate sodium (COLACE) 100 MG capsule Take 1  capsule (100 mg total) by mouth 2 (two) times daily. Patient not taking: Reported on 06/24/2018 04/13/18   Newman Pies, MD  oxyCODONE 10 MG TABS Take 1 tablet (10 mg total) by mouth every 4 (four) hours as needed for severe pain ((score 7 to 10)). Patient not taking: Reported on 06/24/2018 04/13/18   Newman Pies, MD    Physical Exam: BP (!) 144/80   Pulse 80   Temp 98.5 F (36.9 C)   Resp 19   Ht 5\' 4"  (1.626 m)   Wt 72.6 kg   SpO2 100%   BMI 27.46 kg/m   General: Well-appearing well-developed no distress well-nourished Eyes: PERRLA EOMI ENT: No rhinorrhea Neck: Neck is supple Cardiovascular: Heart rate regular rate and rhythm no murmur Respiratory: CTA bilaterally, effort is normal Abdomen: Soft nontender no hepatosplenomegaly normoactive bowel sounds Skin: Intact no rash warm and dry Musculoskeletal: She moves all 4 extremities no edema Psychiatric: Appropriate  making good conversation very pleasant Neurologic: Mild facial droop she is seeing her sentence correctly no pronator drift.  She passed her bedside swallow test          Labs on Admission:  Basic Metabolic Panel: Recent Labs  Lab 06/24/18 1239  NA 141  K 4.0  CL 116*  CO2 15*  GLUCOSE 113*  BUN 33*  CREATININE 2.78*  CALCIUM 8.5*   Liver Function Tests: Recent Labs  Lab 06/24/18 1239  AST 17  ALT 11  ALKPHOS 71  BILITOT 0.5  PROT 5.6*  ALBUMIN 2.9*   No results for input(s): LIPASE, AMYLASE in the last 168 hours. No results for input(s): AMMONIA in the last 168 hours. CBC: Recent Labs  Lab 06/24/18 1239  WBC 9.9  NEUTROABS 7.2  HGB 12.2  HCT 39.4  MCV 97.3  PLT 437*   Cardiac Enzymes: No results for input(s): CKTOTAL, CKMB, CKMBINDEX, TROPONINI in the last 168 hours.  BNP (last 3 results) No results for input(s): BNP in the last 8760 hours.  ProBNP (last 3 results) No results for input(s): PROBNP in the last 8760 hours.  CBG: Recent Labs  Lab 06/24/18 1346  GLUCAP 92     Radiological Exams on Admission: Ct Head Wo Contrast  Result Date: 06/24/2018 CLINICAL DATA:  Slurred speech.  Expressive aphasia. EXAM: CT HEAD WITHOUT CONTRAST TECHNIQUE: Contiguous axial images were obtained from the base of the skull through the vertex without intravenous contrast. COMPARISON:  Multiple exams, including 02/25/2016 FINDINGS: Brain: Small remote right cerebellar lacunar infarct on image 10/3, unchanged. Small focal fairly sharply defined hypodensity in the right thalamus, 4 mm in diameter, probably a small lacunar infarct. Periventricular white matter and corona radiata hypodensities favor chronic ischemic microvascular white matter disease. No intracranial hemorrhage, mass lesion, or acute CVA. Vascular: Unremarkable Skull: Unremarkable Sinuses/Orbits: Minimal chronic left maxillary sinusitis. Chronic bilateral sphenoid sinusitis. Other: No supplemental non-categorized findings. IMPRESSION: 1. No definite acute intracranial findings. 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 2. 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 Electronically Signed   By: Van Clines M.D.   On: 06/24/2018 13:37    EKG:  Sinus tachycardia with Premature supraventricular complexes Otherwise normal ECG read by ER MD  Assessment/Plan Present on Admission: **None** Acute stroke patient be admitted for stroke work-up.  I have consulted neurology Hypertension continue current management Bilateral knee pain with DJD Urinary tract infection we will start her on doxycycline since she is allergic to both penicillin and sulfa Chronic kidney disease Metabolic acidosis Will admit to inpatient  Active Problems:   * No active hospital problems. * Acute stroke patient be admitted for stroke work-up have consulted neurology Hypertension continue current management Bilateral knee pain with  DJD Urinary tract infection we will start her on doxycycline since she is allergic to both penicillin and sulfa Chronic kidney disease Metabolic acidosis   DVT prophylaxis: Lovenox  Code Status: Full  Family Communication: None just stepped away from the room  Disposition Plan: Home to be determined  Consults called: Neurology  Admission status: Inpatient    Cristal Deer MD [facility] Pager [phone]  If 7PM-7AM, please contact night-coverage [email or web address] Password [facility]  06/24/2018, 4:43 PM

## 2018-06-24 NOTE — Consult Note (Signed)
Referring Physician: Dr. Kyung Bacca    Chief Complaint: Dysphasia  HPI: Carolyn Robertson is an 72 y.o. female who presented to the ED on Sunday afternoon with slurred speech and "trouble getting the words out" requiring extra effort to talk. She was unsure whether she woke up this way but her husband first noticed the speech deficit at bout 2 PM. She initially thought that her speech deficit was because she changed her medication, Lyrica and thought that it could be related.  Later on Sunday the symptoms were worse and she could not form sentences, seeming to be saying the wrong words, with more effort required for her to construct sentences. In the ED she was alert and oriented. She would answer questions incorrectly at times on first try but was able to immediately correct herself. No weakness was noted by Triage nurse. on assessment. The patient also endorsed a tendency to fall backward.    She denies having had arm or leg weakness, trouble with vision, headache, CP, SOB or limb pain. She states that she has chronic neck pain.    Past Medical History:  Diagnosis Date  . Anxiety   . Arthritis   . 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 GERD (gastroesophageal reflux disease)   . History of kidney stones   . Hypertension   . Hypothyroidism   . Incontinent of urine   . Renal disorder    stage IV kidney disease  . Thyroid disease     Past Surgical History:  Procedure Laterality Date  . CHOLECYSTECTOMY    . DILATION AND CURETTAGE OF UTERUS    . EYE SURGERY Right    retina surgery  . KNEE SURGERY    . LUMBAR LAMINECTOMY/DECOMPRESSION MICRODISCECTOMY Right 04/12/2018   Procedure: MICRODISCECTOMY LUMBAR FOUR- LUMBAR FIVE;  Surgeon: Newman Pies, MD;  Location: [place];  Service: Neurosurgery;  Laterality: Right;  MICRODISCECTOMY LUMBAR FOUR- LUMBAR FIVE  . TONSILLECTOMY      No family history on file. Social History:  reports that she has never smoked. She has never  used smokeless tobacco. She reports that she does not drink alcohol or use drugs.  Allergies:  Allergies  Allergen Reactions  . Other Swelling    A bandage/gauze that was used on lower extremity wound at [facility] health, unsure type- Pt states her foot turned red and swelled up  . Penicillins Swelling and Rash    Has patient had a PCN reaction causing immediate rash, facial/tongue/throat swelling, SOB or lightheadedness with hypotension: Unknown Has patient had a PCN reaction causing severe rash involving mucus membranes or skin necrosis: Unknown Has patient had a PCN reaction that required hospitalization: No Has patient had a PCN reaction occurring within the last 10 years: No If all of the above answers are "NO", then may proceed with Cephalosporin use.   . Sulfa Antibiotics Itching and Swelling  . Codeine Nausea Only  . Hydrocodone-Acetaminophen Other (See Comments)    Causes hallucinations  . Naproxen Sodium Swelling and Other (See Comments)    (ALEVE) Red Man Syndrome  . Tape Rash and Other (See Comments)    BURNS SKIN --PAPER TAPE IS OKAY    Medications:  Prior to Admission:  Medications Prior to Admission  Medication Sig Dispense Refill Last Dose  . baclofen (LIORESAL) 10 MG tablet Take 10 mg by mouth daily as needed for muscle spasms.   unk at prn  . cyanocobalamin (,VITAMIN B-12,) 1000 MCG/ML injection Inject 1,000  mcg into the muscle every 30 (thirty) days.   Past Month at Unknown time  . DULoxetine (CYMBALTA) 60 MG capsule Take 60 mg by mouth at bedtime.   06/23/2018 at Unknown time  . estrogen, conjugated,-medroxyprogesterone (PREMPRO) 0.625-2.5 MG per tablet Take 1 tablet by mouth daily.   06/23/2018 at Unknown time  . febuxostat (ULORIC) 40 MG tablet Take 40 mg by mouth at bedtime.    06/23/2018 at Unknown time  . furosemide (LASIX) 40 MG tablet Take 40 mg by mouth daily.    Past Week at Unknown time  . HYDROcodone-acetaminophen (NORCO/VICODIN) 5-325 MG tablet Take 1 tablet  by mouth every 4 (four) hours as needed for pain. Every 4-6 hrs as needed  0 06/24/2018 at Unknown time  . LYRICA 25 MG capsule Take 25 mg by mouth 3 (three) times daily.   2 06/23/2018 at Unknown time  . oxybutynin (DITROPAN-XL) 5 MG 24 hr tablet Take 5 mg by mouth at bedtime.  6 06/23/2018 at Unknown time  . pantoprazole (PROTONIX) 40 MG tablet Take 40 mg by mouth daily before breakfast.  4 06/23/2018 at Unknown time  . ranitidine (ZANTAC) 150 MG tablet Take 150 mg by mouth as needed for heartburn.   06/23/2018 at Unknown time  . SYNTHROID 150 MCG tablet Take 150 mcg by mouth daily before breakfast.  1 06/23/2018 at Unknown time  . telmisartan (MICARDIS) 20 MG tablet Take 20 mg by mouth at bedtime.  5 06/23/2018 at Unknown time  . traMADol (ULTRAM) 50 MG tablet Take 50 mg by mouth every 8 (eight) hours as needed for pain.   unk at prn  . docusate sodium (COLACE) 100 MG capsule Take 1 capsule (100 mg total) by mouth 2 (two) times daily. (Patient not taking: Reported on 06/24/2018) 60 capsule 0 Not Taking at Unknown time  . oxyCODONE 10 MG TABS Take 1 tablet (10 mg total) by mouth every 4 (four) hours as needed for severe pain ((score 7 to 10)). (Patient not taking: Reported on 06/24/2018) 30 tablet 0 Not Taking at Unknown time   Scheduled: . doxycycline  100 mg Oral Q12H  . DULoxetine  60 mg Oral QHS  . enoxaparin (LOVENOX) injection  30 mg Subcutaneous Q24H  . estrogen (conjugated)-medroxyprogesterone  1 tablet Oral Daily  . famotidine  20 mg Oral Daily  . febuxostat  40 mg Oral QHS  . furosemide  40 mg Oral Daily  . irbesartan  150 mg Oral Daily  . levothyroxine  150 mcg Oral QAC breakfast  . pantoprazole  40 mg Oral QAC breakfast  . pregabalin  25 mg Oral TID  . senna-docusate  1 tablet Oral QHS   Continuous: . sodium chloride 10 mL/hr at 06/24/18 2004    ROS: As per HPI.   Physical Examination: Blood pressure (!) 172/84, pulse 84, temperature 97.8 F (36.6 C), temperature source Oral, resp. rate  17, height _0  (1.626 m), weight 72.6 kg, SpO2 100 %.  HEENT: [place]/AT Lungs: Respirations unlabored Ext: Bilateral pretibial edema.  Neurologic Examination: Mental Status: Alert, fully oriented except for "Saturday". Thought content appropriate.  Speech fluent with intact naming and comprehension. Able to follow a directional 3 step command without difficulty. No dysarthria. Able to recall 8 farm animals in 60 seconds.  Cranial Nerves: II:  Visual fields grossly normal, PERRL III,IV, VI: Left sided ptosis noted. Exotropia with left eye lagging right while testing EOM. No nystagmus.   V,VII: Smile symmetric, facial temp sensation intact  bilaterally VIII: hearing intact to voice IX,X: No hypophonia XI: Symmetric XII: midline tongue extension  Motor: Right : Upper extremity   4+/5    Left:     Upper extremity   4+/5  Lower extremity   4/5     Lower extremity   4/5 Sensory: Temp and light touch intact throughout, bilaterally. No extinction.  Deep Tendon Reflexes:  1+ bilateral upper extremities and right patella. 0 left patella and achilles bilaterally.  Plantars: Right: downgoing   Left: downgoing Cerebellar: No ataxia with FNF bilaterally. Action tremor to LUE noted.  Gait: Deferred  Results for orders placed or performed during the hospital encounter of 06/24/18 (from the past 48 hour(s))  Protime-INR     Status: None   Collection Time: 06/24/18 12:39 PM  Result Value Ref Range   Prothrombin Time 13.7 11.4 - 15.2 seconds   INR 1.06     Comment: Performed at [facility], [place] [address], [place], [place] [address]  APTT     Status: None   Collection Time: 06/24/18 12:39 PM  Result Value Ref Range   aPTT 32 24 - 36 seconds    Comment: Performed at [facility] [address], [place], [place] [address]  CBC     Status: Abnormal   Collection Time: 06/24/18 12:39 PM  Result Value Ref Range   WBC 9.9 4.0 - 10.5 K/uL   RBC 4.05 3.87 - 5.11 MIL/uL   Hemoglobin 12.2  12.0 - 15.0 g/dL   HCT 39.4 36.0 - 46.0 %   MCV 97.3 78.0 - 100.0 fL   MCH 30.1 26.0 - 34.0 pg   MCHC 31.0 30.0 - 36.0 g/dL   RDW 14.9 11.5 - 15.5 %   Platelets 437 (H) 150 - 400 K/uL    Comment: Performed at [facility] [address], [place], [place] [address]  Differential     Status: None   Collection Time: 06/24/18 12:39 PM  Result Value Ref Range   Neutrophils Relative % 73 %   Neutro Abs 7.2 1.7 - 7.7 K/uL   Lymphocytes Relative 16 %   Lymphs Abs 1.6 0.7 - 4.0 K/uL   Monocytes Relative 9 %   Monocytes Absolute 0.9 0.1 - 1.0 K/uL   Eosinophils Relative 1 %   Eosinophils Absolute 0.1 0.0 - 0.7 K/uL   Basophils Relative 1 %   Basophils Absolute 0.1 0.0 - 0.1 K/uL   Immature Granulocytes 0 %   Abs Immature Granulocytes 0.0 0.0 - 0.1 K/uL    Comment: Performed at [facility] [address] [place], [place] [address]  Comprehensive metabolic panel     Status: Abnormal   Collection Time: 06/24/18 12:39 PM  Result Value Ref Range   Sodium 141 135 - 145 mmol/L   Potassium 4.0 3.5 - 5.1 mmol/L   Chloride 116 (H) 98 - 111 mmol/L   CO2 15 (L) 22 - 32 mmol/L   Glucose, Bld 113 (H) 70 - 99 mg/dL   BUN 33 (H) 8 - 23 mg/dL   Creatinine, Ser 2.78 (H) 0.44 - 1.00 mg/dL   Calcium 8.5 (L) 8.9 - 10.3 mg/dL   Total Protein 5.6 (L) 6.5 - 8.1 g/dL   Albumin 2.9 (L) 3.5 - 5.0 g/dL   AST 17 15 - 41 U/L   ALT 11 0 - 44 U/L   Alkaline Phosphatase 71 38 - 126 U/L   Total Bilirubin 0.5 0.3 -  1.2 mg/dL   GFR calc non Af Amer 16 (L) >60 mL/min   GFR calc Af Amer 18 (L) >60 mL/min    Comment: (NOTE) The eGFR has been calculated using the CKD EPI equation. This calculation has not been validated in all clinical situations. eGFR's persistently <60 mL/min signify possible Chronic Kidney Disease.    Anion gap 10 5 - 15    Comment: Performed at [facility] [address], [place], [place] [address]  I-stat troponin, ED     Status: None   Collection Time: 06/24/18  1:10  PM  Result Value Ref Range   Troponin i, poc 0.06 0.00 - 0.08 ng/mL   Comment 3            Comment: Due to the release kinetics of cTnI, a negative result within the first hours of the onset of symptoms does not rule out myocardial infarction with certainty. If myocardial infarction is still suspected, repeat the test at appropriate intervals.   CBG monitoring, ED     Status: None   Collection Time: 06/24/18  1:46 PM  Result Value Ref Range   Glucose-Capillary 92 70 - 99 mg/dL  Urinalysis, Routine w reflex microscopic     Status: Abnormal   Collection Time: 06/24/18  5:07 PM  Result Value Ref Range   Color, Urine YELLOW YELLOW   APPearance CLOUDY (A) CLEAR   Specific Gravity, Urine 1.012 1.005 - 1.030   pH 5.0 5.0 - 8.0   Glucose, UA NEGATIVE NEGATIVE mg/dL   Hgb urine dipstick LARGE (A) NEGATIVE   Bilirubin Urine NEGATIVE NEGATIVE   Ketones, ur NEGATIVE NEGATIVE mg/dL   Protein, ur 30 (A) NEGATIVE mg/dL   Nitrite NEGATIVE NEGATIVE   Leukocytes, UA LARGE (A) NEGATIVE   RBC / HPF >50 (H) 0 - 5 RBC/hpf   WBC, UA >50 (H) 0 - 5 WBC/hpf   Bacteria, UA MANY (A) NONE SEEN   WBC Clumps PRESENT    Mucus PRESENT    Hyaline Casts, UA PRESENT     Comment: Performed at [facility] [facility], [address], [place], [place] [address]   Ct Head Wo Contrast  Result Date: 06/24/2018 CLINICAL DATA:  Slurred speech.  Expressive aphasia. EXAM: CT HEAD WITHOUT CONTRAST TECHNIQUE: Contiguous axial images were obtained from the base of the skull through the vertex without intravenous contrast. COMPARISON:  Multiple exams, including 02/25/2016 FINDINGS: Brain: Small remote right cerebellar lacunar infarct on image 10/3, unchanged. Small focal fairly sharply defined hypodensity in the right thalamus, 4 mm in diameter, probably a small lacunar infarct. Periventricular white matter and corona radiata hypodensities favor chronic ischemic microvascular white matter disease. No intracranial hemorrhage, mass  lesion, or acute CVA. Vascular: Unremarkable Skull: Unremarkable Sinuses/Orbits: Minimal chronic left maxillary sinusitis. Chronic bilateral sphenoid sinusitis. Other: No supplemental non-categorized findings. IMPRESSION: 1. No definite acute intracranial findings. 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 2. 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 Electronically Signed   By: Van Clines M.D.   On: 06/24/2018 13:37    Assessment: 72 y.o. female presenting with dysfluent speech. DDx includes TIA and anxiety or stress-related symptoms 1. Exam reveals no dysarthria or receptive/expressive speech deficit. No lateralized numbness or weakness noted.  2. CT head shows no definite acute intracranial findings. There is evidence of chronic microvascular white matter disease and small remote lacunar infarcts in the right  thalamus and right cerebellum.  3. Stroke Risk Factors - HTN 4. MRI brain: Old right cerebellar infarct and findings of chronic ischemic microangiopathy without acute intracranial abnormality. 5. Normal intracranial MRA.  Plan: 1. HgbA1c, fasting lipid panel 2. TTE 3. Carotid dopplers 4. PT consult, OT consult, Speech consult 5. Prophylactic therapy- Start ASA 81 mg po qd 6. Telemetry monitoring 7. Frequent neuro checks 8. BP management  _0  signed: Dr. Kerney Elbe 06/24/2018, 7:22 PM

## 2018-06-24 NOTE — ED Triage Notes (Addendum)
Pt arrives for eval of stroke symptoms, states she might have woken up like this yesterday but that her husband noticed this once they were running errands around 2pm. she has slurred speech with some expressive aphasia, pt states it is difficult for her to speak and is requiring extra effort to talk. She is alert and oriented, but answers questions incorrectly at times on first try but immediately corrects herself. No weakness noted on assessment. Pt does report she has chronic back pain with bilateral leg weakness that is not different. No leg drifts on assessment. Pt also has a noted facial droop. Pt also thinks she has a UTI and has edema to bilateral legs, significant swelling. states she saw PCP for this Friday and had labs drawn and was told to start taking lasix but has not yet.

## 2018-06-24 NOTE — ED Notes (Signed)
EDP at bedside  

## 2018-06-25 ENCOUNTER — Inpatient Hospital Stay (HOSPITAL_COMMUNITY): Payer: Medicare Other

## 2018-06-25 DIAGNOSIS — G9340 Encephalopathy, unspecified: Secondary | ICD-10-CM

## 2018-06-25 DIAGNOSIS — I361 Nonrheumatic tricuspid (valve) insufficiency: Secondary | ICD-10-CM

## 2018-06-25 DIAGNOSIS — G459 Transient cerebral ischemic attack, unspecified: Secondary | ICD-10-CM

## 2018-06-25 DIAGNOSIS — I639 Cerebral infarction, unspecified: Secondary | ICD-10-CM

## 2018-06-25 LAB — BASIC METABOLIC PANEL
Anion gap: 12 (ref 5–15)
BUN: 35 mg/dL — AB (ref 8–23)
CALCIUM: 8.5 mg/dL — AB (ref 8.9–10.3)
CHLORIDE: 114 mmol/L — AB (ref 98–111)
CO2: 18 mmol/L — AB (ref 22–32)
CREATININE: 2.41 mg/dL — AB (ref 0.44–1.00)
GFR calc non Af Amer: 19 mL/min — ABNORMAL LOW (ref >60)
GFR, EST AFRICAN AMERICAN: 22 mL/min — AB (ref >60)
Glucose, Bld: 96 mg/dL (ref 70–99)
Potassium: 4.1 mmol/L (ref 3.5–5.1)
SODIUM: 144 mmol/L (ref 135–145)

## 2018-06-25 LAB — LIPID PANEL
Cholesterol: 139 mg/dL (ref 0–200)
HDL: 71 mg/dL (ref >40)
LDL CALC: 49 mg/dL (ref 0–99)
Total CHOL/HDL Ratio: 2 RATIO
Triglycerides: 94 mg/dL (ref <150)
VLDL: 19 mg/dL (ref 0–40)

## 2018-06-25 LAB — ECHOCARDIOGRAM COMPLETE
HEIGHTINCHES: 64 in
WEIGHTICAEL: 2560 [oz_av]

## 2018-06-25 MED ORDER — SODIUM CHLORIDE 0.9 % IV SOLN
1.0000 g | INTRAVENOUS | Status: DC
  Administered 2018-06-25: 1 g via INTRAVENOUS
  Filled 2018-06-25 (×2): qty 10

## 2018-06-25 MED ORDER — PRO-STAT SUGAR FREE PO LIQD
30.0000 mL | Freq: Two times a day (BID) | ORAL | Status: DC
  Administered 2018-06-25 – 2018-06-26 (×2): 30 mL via ORAL
  Filled 2018-06-25 (×2): qty 30

## 2018-06-25 MED ORDER — INFLUENZA VAC SPLIT HIGH-DOSE 0.5 ML IM SUSY
0.5000 mL | PREFILLED_SYRINGE | INTRAMUSCULAR | Status: DC
  Filled 2018-06-25: qty 0.5

## 2018-06-25 MED ORDER — ESTROGENS CONJUGATED 0.625 MG PO TABS
0.6250 mg | ORAL_TABLET | Freq: Every day | ORAL | Status: DC
  Administered 2018-06-25 – 2018-06-26 (×2): 0.625 mg via ORAL
  Filled 2018-06-25 (×2): qty 1

## 2018-06-25 MED ORDER — SODIUM CHLORIDE 0.9 % IV BOLUS
500.0000 mL | Freq: Once | INTRAVENOUS | Status: AC
  Administered 2018-06-25: 500 mL via INTRAVENOUS

## 2018-06-25 MED ORDER — MEDROXYPROGESTERONE ACETATE 2.5 MG PO TABS
2.5000 mg | ORAL_TABLET | Freq: Every day | ORAL | Status: DC
  Administered 2018-06-25 – 2018-06-26 (×2): 2.5 mg via ORAL
  Filled 2018-06-25 (×2): qty 1

## 2018-06-25 MED ORDER — ASPIRIN EC 81 MG PO TBEC
81.0000 mg | DELAYED_RELEASE_TABLET | Freq: Every day | ORAL | Status: DC
  Administered 2018-06-25 – 2018-06-26 (×2): 81 mg via ORAL
  Filled 2018-06-25 (×2): qty 1

## 2018-06-25 NOTE — Progress Notes (Signed)
VASCULAR LAB PRELIMINARY  PRELIMINARY  PRELIMINARY  PRELIMINARY  Carotid duplex completed.    Preliminary report:  1-39% ICA plaquing. Vertebral artery flow is antegrade.   Carolyn Robertson, RVT 06/25/2018, 2:53 PM

## 2018-06-25 NOTE — Progress Notes (Signed)
Physical Therapy Treatment Patient Details Name: Carolyn Robertson MRN: [id number] DOB: Mar 02, 1946 Today's Date: 06/25/2018    History of Present Illness 72 yo female admitted with slurred speech and aphasia. HEad CT and MRI-unremarkable. Admitted for possible TIA. PMH: HTN, anxiety.    PT Comments    Tolerated second session today for stair training. Pt has to negotiate 13 stairs to get into home. Able to perform 4 steps today, limited due to lines. Reviewed safer techniques and where to place RW for assist once at the bottom. Increased time to perform with pt using handrail on 1 side and stair on other side for support (in crawling type pattern). Might benefit from using her [facility] for BUE support. Encouraged pt to have spouse present if not feeling great that day. Plans to d/c home later today. Will follow if still in the hospital.     Follow Up Recommendations  Outpatient PT;Supervision for mobility/OOB     Equipment Recommendations  None recommended by PT    Recommendations for Other Services       Precautions / Restrictions Precautions Precautions: Fall Precaution Comments: reports recent fall from chair  Restrictions Weight Bearing Restrictions: No    Mobility  Bed Mobility Overal bed mobility: Needs Assistance Bed Mobility: Supine to Sit     Supine to sit: Supervision;HOB elevated     General bed mobility comments: No assist needed. Use of rail.  Transfers Overall transfer level: Needs assistance Equipment used: Rolling walker (2 wheeled) Transfers: Sit to/from Stand Sit to Stand: Supervision         General transfer comment: Supervision for safety. Stood from [employer].   Ambulation/Gait Ambulation/Gait assistance: Min guard Gait Distance (Feet): 250 Feet Assistive device: Rolling walker (2 wheeled) Gait Pattern/deviations: Step-through pattern;Decreased stride length Gait velocity: decreased   General Gait Details: Slow, mildly unsteady gait with RW for  support. HR up to 120 bpm.   Stairs Stairs: Yes Stairs assistance: Min assist;Min guard Stair Management: One rail Left;Step to pattern Number of Stairs: 3 General stair comments: Limited due to lines. Pt using 1 UE on rail and the other on stair - as she does at home.   Wheelchair Mobility    Modified Rankin (Stroke Patients Only) Modified Rankin (Stroke Patients Only) Pre-Morbid Rankin Score: Moderately severe disability Modified Rankin: Moderately severe disability     Balance Overall balance assessment: Needs assistance Sitting-balance support: Feet supported;No upper extremity supported Sitting balance-Leahy Scale: Good     Standing balance support: During functional activity;Single extremity supported Standing balance-Leahy Scale: Fair Standing balance comment: Requires at least 1 UE support for dynamic tasks.                             Cognition Arousal/Alertness: Awake/alert Behavior During Therapy: WFL for tasks assessed/performed Overall Cognitive Status: Impaired/Different from baseline Area of Impairment: Attention                   Current Attention Level: Sustained                  Exercises      General Comments General comments (skin integrity, edema, etc.): Spouse present during session.       Pertinent Vitals/Pain Pain Assessment: Faces Pain Score: 5  Faces Pain Scale: Hurts even more Pain Location: back  Pain Descriptors / Indicators: Constant Pain Intervention(s): Monitored during session;Repositioned;Premedicated before session    Home Living Family/patient expects to  be discharged to:: Private residence Living Arrangements: Spouse/significant other Available Help at Discharge: Family Type of Home: House Home Access: Stairs to enter Entrance Stairs-Rails: Left Home Layout: One level [place] - single point;Walker - 4 wheels;Walker - 2 wheels;Wheelchair - manual      Prior Function Level of  Independence: Needs assistance  Gait / Transfers Assistance Needed: husband assists patient step over tub; uses SPC for community ambulation and funriture walking for household ambulation. ADL's / Homemaking Assistance Needed: independent with ADL's     PT Goals (current goals can now be found in the care plan section) Acute Rehab PT Goals Patient Stated Goal: to be able to keep moving PT Goal Formulation: With patient Time For Goal Achievement: 07/09/18 Potential to Achieve Goals: Good Progress towards PT goals: Progressing toward goals    Frequency    Min 3X/week      PT Plan Current plan remains appropriate    Co-evaluation              AM-PAC PT "6 Clicks" Daily Activity  Outcome Measure  Difficulty turning over in bed (including adjusting bedclothes, sheets and blankets)?: None Difficulty moving from lying on back to sitting on the side of the bed? : None Difficulty sitting down on and standing up from a chair with arms (e.g., wheelchair, bedside commode, etc,.)?: None Help needed moving to and from a bed to chair (including a wheelchair)?: A Little Help needed walking in hospital room?: A Little Help needed climbing 3-5 steps with a railing? : A Little 6 Click Score: 21    End of Session Equipment Utilized During Treatment: Gait belt Activity Tolerance: Patient tolerated treatment well Patient left: in bed;with call bell/phone within reach;with bed alarm set Nurse Communication: Mobility status PT Visit Diagnosis: Muscle weakness (generalized) (M62.81);Difficulty in walking, not elsewhere classified (R26.2)     Time: 0076-2263 PT Time Calculation (min) (ACUTE ONLY): 15 min  Charges:  $Gait Training: 8-22 mins                     Wray Kearns, PT, DPT Acute Rehabilitation Services Pager [phone] Office [phone]       [place] 06/25/2018, 11:56 AM

## 2018-06-25 NOTE — Progress Notes (Signed)
EEG complete - results pending 

## 2018-06-25 NOTE — Progress Notes (Signed)
Initial Nutrition Assessment  DOCUMENTATION CODES:   Not applicable   (nutrition-focused physical exam to be completed at follow-up)  INTERVENTION:   - Pro-stat 30 ml BID, each supplement provides 100 kcal and 15 grams of protein  NUTRITION DIAGNOSIS:   Increased nutrient needs related to chronic illness (CKD stage IV) as evidenced by estimated needs.  GOAL:   Patient will meet greater than or equal to 90% of their needs  MONITOR:   PO intake, Supplement acceptance, Weight trends, Labs  REASON FOR ASSESSMENT:   Malnutrition Screening Tool    ASSESSMENT:   72 year old female who presented to the ED with aphasia and facial droop. PMH significant for GERD, hypertension, hypothyroidism, and CKD stage IV. Pt admitted for workup for CVA.  Attempted to speak with pt x 3. On each attempt, pt was not in room and at a test/procedure. Will attempt to obtain nutrition history and complete NFPE at follow-up.  No meal completion recorded in chart at this time.  Noted weight loss of 8 lbs over the past 2.5 months. This is a 4.7% weight loss which is not significant for timeframe.  Medications reviewed and include: 20 mg Pepcid daily, 150 mcg levothyroxine daily, 40 mg Protonix daily, Senokot-S daily  Labs reviewed: CO2 18 (L), BUN 35 (H), creatinine 2.41 (H)  NUTRITION - FOCUSED PHYSICAL EXAM:  Unable to complete as pt was out of room on all attempted visits. Will re-attempt at follow-up.  Diet Order:   Diet Order            Diet Heart Room service appropriate? Yes; Fluid consistency: Thin  Diet effective now              EDUCATION NEEDS:   No education needs have been identified at this time  Skin:  Skin Assessment: Reviewed RN Assessment  Last BM:  06/25/18 - small type 4  Height:   Ht Readings from Last 1 Encounters:  06/24/18 5\' 4"  (1.626 m)    Weight:   Wt Readings from Last 1 Encounters:  06/24/18 72.6 kg    Ideal Body Weight:  54.55 kg  BMI:  Body  mass index is 27.46 kg/m.  Estimated Nutritional Needs:   Kcal:  1450-1650  Protein:  75-90 grams  Fluid:  1.5-1.7 L    Gaynell Face, MS, RD, LDN Pager: [phone] Weekend/After Hours: [phone]

## 2018-06-25 NOTE — Evaluation (Signed)
Occupational Therapy Evaluation Patient Details Name: Carolyn Robertson MRN: [id number] DOB: [birth year] Today's Date: 06/25/2018    History of Present Illness 72 yo female admitted with slurred speech and workup for CVA underway PMH:  Past Medical History:  Diagnosis Date  . Anxiety   . Arthritis   . 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 GERD (gastroesophageal reflux disease)   . History of kidney stones   . Hypertension   . Hypothyroidism   . Incontinent of urine    incontinent at night  . Renal disorder    stage IV kidney disease  . Thyroid disease       Clinical Impression   PT admitted with slurred speech. Pt currently with functional limitiations due to the deficits listed below (see OT problem list). Pt currently with back pain and decr balance. Recommending use of RW for transfers. Pt reports falling from a chair doing LB adls recently. Pt will benefit from skilled OT to increase their independence and safety with adls and balance to allow discharge [place].     Follow Up Recommendations  Home health OT    Equipment Recommendations  None recommended by OT    Recommendations for Other Services       Precautions / Restrictions Precautions Precautions: Fall Precaution Comments: reports recent fall from chair  Restrictions Weight Bearing Restrictions: No      Mobility Bed Mobility               General bed mobility comments: sitting eob on arrival  Transfers Overall transfer level: Needs assistance Equipment used: Rolling walker (2 wheeled) Transfers: Sit to/from Stand Sit to Stand: Supervision              Balance Overall balance assessment: Mild deficits observed, not formally tested                                         ADL either performed or assessed with clinical judgement   ADL Overall ADL's : Needs assistance/impaired Eating/Feeding: Modified independent Eating/Feeding Details (indicate cue  type and reason): reports spouse has to open containers Grooming: Wash/dry hands;Wash/dry face;Oral care;Supervision/safety;Standing Grooming Details (indicate cue type and reason): pt reports incr pain with static standing. min cues for positioning with RW Upper Body Bathing: Supervision/ safety               Toilet Transfer: Supervision/safety;RW           Functional mobility during ADLs: Supervision/safety;Rolling walker General ADL Comments: pt was able to use the phone to verbalize breakfast request without error     Vision Baseline Vision/History: Wears glasses Wears Glasses: Reading only       Perception     Praxis      Pertinent Vitals/Pain Pain Assessment: 0-10 Pain Score: 5  Pain Location: back  Pain Descriptors / Indicators: Constant Pain Intervention(s): Monitored during session;Repositioned     Hand Dominance Right   Extremity/Trunk Assessment         Cervical / Trunk Assessment Cervical / Trunk Assessment: Other exceptions Cervical / Trunk Exceptions: hx of back pain and neck    Communication Communication Communication: Expressive difficulties   Cognition Arousal/Alertness: Awake/alert Behavior During Therapy: WFL for tasks assessed/performed Overall Cognitive Status: Within Functional Limits for tasks assessed  General Comments  pt reports tremor in hands and decr grasp for holding objects     Exercises     Shoulder Instructions      Home Living Family/patient expects to be discharged to:: Private residence Living Arrangements: Spouse/significant other Available Help at Discharge: Family;Available 24 hours/day Type of Home: House Home Access: Stairs to enter [employer] of Steps: 13 Entrance Stairs-Rails: Left Home Layout: One level     Bathroom Shower/Tub: Teacher, early years/pre: Standard     Home Equipment: Cane - single point;Walker - 4  wheels;Walker - 2 wheels;Wheelchair - manual          Prior Functioning/Environment Level of Independence: Needs assistance  Gait / Transfers Assistance Needed: husband assists patient step over tub; uses SPC  ADL's / Homemaking Assistance Needed: independent with ADL's            OT Problem List: Decreased range of motion;Decreased strength;Impaired balance (sitting and/or standing);Decreased activity tolerance;Decreased safety awareness;Decreased knowledge of use of DME or AE;Decreased knowledge of precautions;Pain;Impaired UE functional use;Obesity      OT Treatment/Interventions: Self-care/ADL training;Therapeutic exercise;Neuromuscular education;Energy conservation;DME and/or AE instruction;Manual therapy;Modalities;Therapeutic activities;Patient/family education;Balance training    OT Goals(Current goals can be found in the care plan section) Acute Rehab OT Goals Patient Stated Goal: to get the skis for my walker OT Goal Formulation: With patient Time For Goal Achievement: 07/09/18 Potential to Achieve Goals: Good  OT Frequency: Min 2X/week   Barriers to D/C:            Co-evaluation              AM-PAC PT "6 Clicks" Daily Activity     Outcome Measure Help from another person eating meals?: None Help from another person taking care of personal grooming?: A Little Help from another person toileting, which includes using toliet, bedpan, or urinal?: A Little Help from another person bathing (including washing, rinsing, drying)?: A Little Help from another person to put on and taking off regular upper body clothing?: A Little Help from another person to put on and taking off regular lower body clothing?: A Little 6 Click Score: 19   End of Session Equipment Utilized During Treatment: Rolling walker Nurse Communication: Mobility status;Precautions  Activity Tolerance: Patient tolerated treatment well Patient left: in bed;with call bell/phone within reach;with  family/visitor present  OT Visit Diagnosis: Unsteadiness on feet (R26.81);Muscle weakness (generalized) (M62.81)                Time: 8546-2703 OT Time Calculation (min): 20 min Charges:  OT General Charges $OT Visit: 1 Visit OT Evaluation $OT Eval Moderate Complexity: 1 Mod   Jeri Modena, OTR/L  Acute Rehabilitation Services Pager: [phone] Office: [phone] . Parke Poisson B 06/25/2018, 8:54 AM

## 2018-06-25 NOTE — Progress Notes (Addendum)
PROGRESS NOTE    Carolyn Robertson  XNT:[id number] DOB: Jan 30, 1946 DOA: 06/24/2018 PCP: Burnard Bunting, MD   Brief Narrative: Patient is a 72 year old female with past medical history of osteoarthritis, hypertension, hypothyroidism, CKD stage IV who presented to the emergency department with complaints of difficulty finding words.MRI of the brain did not show any acute stroke.  Neurology following .Patient waiting for further work-up for TIA . Assessment & Plan:   Principal Problem:   TIA (transient ischemic attack) Active Problems:   Stage 4 chronic kidney disease (HCC)   Primary localized osteoarthritis of right knee  TIA: Presented with word finding difficulty.  Currently this problem has resolved.  Patient does not have any focal neurological deficits right now. 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 Neurology following and recommending echocardiogram, carotid Doppler.  Both of these tests have been pending today. Patient evaluated by PT/OT and recommended outpatient physical therapy. Continue aspirin 81 mg daily. LDL of 49.  Hemoglobin A1c pending.  Stage IV CKD: Currently her kidney function is on baseline.  Follows with [facility].  Hypotension: Patient's blood pressure dropped to 83/57 mmHg this afternoon.  We will give her a bolus of 500 ml.  We will hold telmisartan and Lasix.  We will continue to monitor her blood pressure.  Mental status remained stable and she looks comfortable.  Possible UTI: Denies any dysuria.  Urinalysis was suggestive of UTI.  On ceftriaxone.  Will check urine culture.  Osteoarthritis of right knee: Continue supportive care.  Pain management.  Hypothyroidism: Continue Synthyroid.    DVT prophylaxis: SCD Code Status: Full Family Communication: Husband present at the bedside Disposition Plan: Likely home tomorrow.  Today she is waiting for TTE,  carotid Doppler.  Blood pressure also dropped in the afternoon. Awaiting follow-up from neurology   Consultants: Neurology  Procedures:None  Antimicrobials:Ceftraixone  Subjective: Patient seen and examined the bedside this morning.  Remains very comfortable.  No focal neurological deficits.  Speech is clear this morning.  Denies any complaints.   Objective: Vitals:   06/25/18 0328 06/25/18 0804 06/25/18 0829 06/25/18 1200  BP: 135/68 113/74 106/66 (!) 83/57  Pulse: 86 99 (!) 101 96  Resp: 18   17  Temp: 98.2 F (36.8 C)   98.3 F (36.8 C)  TempSrc: Oral   Oral  SpO2: 96%   97%  Weight:      Height:        Intake/Output Summary (Last 24 hours) at 06/25/2018 1427 Last data filed at 06/25/2018 0600 Gross per 24 hour  Intake 323.32 ml  Output -  Net 323.32 ml   Filed Weights   06/24/18 1347  Weight: 72.6 kg    Examination:  General exam: Appears calm and comfortable ,Not in distress, very pleasant elderly female HEENT:PERRL,Oral mucosa moist, Ear/Nose normal on gross exam Respiratory system: Bilateral equal air entry, normal vesicular breath sounds, no wheezes or crackles  Cardiovascular system: S1 & S2 heard, RRR. No JVD, murmurs, rubs, gallops or clicks. No pedal edema. Gastrointestinal system: Abdomen is nondistended, soft and nontender. No organomegaly or masses felt. Normal bowel sounds heard. Central nervous system: Alert and oriented. No focal neurological deficits. Extremities: No edema, no clubbing ,no cyanosis, distal peripheral pulses palpable. Skin: No rashes, lesions or ulcers,no icterus ,no pallor MSK: Normal muscle bulk,tone ,power Psychiatry: Judgement and insight appear normal. Mood & affect appropriate.   Data Reviewed: I have personally reviewed following labs  and imaging studies  CBC: Recent Labs  Lab 06/24/18 1239  WBC 9.9  NEUTROABS 7.2  HGB 12.2  HCT 39.4  MCV 97.3  PLT 643*   Basic Metabolic Panel: Recent Labs  Lab 06/24/18 1239  06/25/18 0548  NA 141 144  K 4.0 4.1  CL 116* 114*  CO2 15* 18*  GLUCOSE 113* 96  BUN 33* 35*  CREATININE 2.78* 2.41*  CALCIUM 8.5* 8.5*   GFR: Estimated Creatinine Clearance: 20 mL/min (A) (by C-G formula based on SCr of 2.41 mg/dL (H)). Liver Function Tests: Recent Labs  Lab 06/24/18 1239  AST 17  ALT 11  ALKPHOS 71  BILITOT 0.5  PROT 5.6*  ALBUMIN 2.9*   No results for input(s): LIPASE, AMYLASE in the last 168 hours. No results for input(s): AMMONIA in the last 168 hours. Coagulation Profile: Recent Labs  Lab 06/24/18 1239  INR 1.06   Cardiac Enzymes: No results for input(s): CKTOTAL, CKMB, CKMBINDEX, TROPONINI in the last 168 hours. BNP (last 3 results) No results for input(s): PROBNP in the last 8760 hours. HbA1C: No results for input(s): HGBA1C in the last 72 hours. CBG: Recent Labs  Lab 06/24/18 1346  GLUCAP 92   Lipid Profile: Recent Labs    06/25/18 0548  CHOL 139  HDL 71  LDLCALC 49  TRIG 94  CHOLHDL 2.0   Thyroid Function Tests: No results for input(s): TSH, T4TOTAL, FREET4, T3FREE, THYROIDAB in the last 72 hours. Anemia Panel: No results for input(s): VITAMINB12, FOLATE, FERRITIN, TIBC, IRON, RETICCTPCT in the last 72 hours. Sepsis Labs: No results for input(s): PROCALCITON, LATICACIDVEN in the last 168 hours.  No results found for this or any previous visit (from the past 240 hour(s)).       Radiology Studies: Ct Head Wo Contrast  Result Date: 06/24/2018 CLINICAL DATA:  Slurred speech.  Expressive aphasia. EXAM: CT HEAD WITHOUT CONTRAST TECHNIQUE: Contiguous axial images were obtained from the base of the skull through the vertex without intravenous contrast. COMPARISON:  Multiple exams, including 02/25/2016 FINDINGS: Brain: Small remote right cerebellar lacunar infarct on image 10/3, unchanged. Small focal fairly sharply defined hypodensity in the right thalamus, 4 mm in diameter, probably a small lacunar infarct. Periventricular  white matter and corona radiata hypodensities favor chronic ischemic microvascular white matter disease. No intracranial hemorrhage, mass lesion, or acute CVA. Vascular: Unremarkable Skull: Unremarkable Sinuses/Orbits: Minimal chronic left maxillary sinusitis. Chronic bilateral sphenoid sinusitis. Other: No supplemental non-categorized findings. IMPRESSION: 1. No definite acute intracranial findings. 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 2. 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 Electronically Signed   By: Van Clines M.D.   On: 06/24/2018 13:37   Mr Jodene Nam Head Wo Contrast  Result Date: 06/25/2018 CLINICAL DATA:  Transient ischemic attack EXAM: MRI HEAD WITHOUT CONTRAST MRA HEAD WITHOUT CONTRAST TECHNIQUE: Multiplanar, multiecho pulse sequences of the brain and surrounding structures were obtained without intravenous contrast. Angiographic images of the head were obtained using MRA technique without contrast. COMPARISON:  Head CT 06/24/2018 FINDINGS: MRI HEAD FINDINGS BRAIN: There is no acute infarct, acute hemorrhage or mass effect. The midline structures are normal. There is an old right cerebellar infarct. Early confluent hyperintense T2-weighted signal of the periventricular and deep white matter, most commonly due to chronic ischemic microangiopathy. Generalized atrophy without lobar predilection. Susceptibility-sensitive sequences show no chronic microhemorrhage or superficial siderosis. SKULL AND UPPER CERVICAL SPINE: The visualized skull  base, calvarium, upper cervical spine and extracranial soft tissues are normal. SINUSES/ORBITS: No fluid levels or advanced mucosal thickening. No mastoid or middle ear effusion. The orbits are normal. MRA HEAD FINDINGS Intracranial internal carotid arteries: Normal. Anterior cerebral arteries: Normal. Middle cerebral arteries: Normal. Posterior  communicating arteries: Present bilaterally. Posterior cerebral arteries: Normal. Basilar artery: Normal. Vertebral arteries: Left dominant. Normal. Superior cerebellar arteries: Normal. Inferior cerebellar arteries: Left AICA not clearly visualized, which is not uncommon. Otherwise normal. IMPRESSION: 1. 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 2. Normal intracranial MRA. Electronically Signed   By: Ulyses Jarred M.D.   On: 06/25/2018 01:53   Mr Brain Wo Contrast  Result Date: 06/25/2018 CLINICAL DATA:  Transient ischemic attack EXAM: MRI HEAD WITHOUT CONTRAST MRA HEAD WITHOUT CONTRAST TECHNIQUE: Multiplanar, multiecho pulse sequences of the brain and surrounding structures were obtained without intravenous contrast. Angiographic images of the head were obtained using MRA technique without contrast. COMPARISON:  Head CT 06/24/2018 FINDINGS: MRI HEAD FINDINGS BRAIN: There is no acute infarct, acute hemorrhage or mass effect. The midline structures are normal. There is an old right cerebellar infarct. Early confluent hyperintense T2-weighted signal of the periventricular and deep white matter, most commonly due to chronic ischemic microangiopathy. Generalized atrophy without lobar predilection. Susceptibility-sensitive sequences show no chronic microhemorrhage or superficial siderosis. SKULL AND UPPER CERVICAL SPINE: The visualized skull base, calvarium, upper cervical spine and extracranial soft tissues are normal. SINUSES/ORBITS: No fluid levels or advanced mucosal thickening. No mastoid or middle ear effusion. The orbits are normal. MRA HEAD FINDINGS Intracranial internal carotid arteries: Normal. Anterior cerebral arteries: Normal. Middle cerebral arteries: Normal. Posterior communicating arteries: Present bilaterally. Posterior cerebral arteries: Normal. Basilar artery: Normal. Vertebral arteries: Left dominant. Normal. Superior  cerebellar arteries: Normal. Inferior cerebellar arteries: Left AICA not clearly visualized, which is not uncommon. Otherwise normal. IMPRESSION: 1. 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 2. Normal intracranial MRA. Electronically Signed   By: Ulyses Jarred M.D.   On: 06/25/2018 01:53        Scheduled Meds: . aspirin EC  81 mg Oral Daily  . doxycycline  100 mg Oral Q12H  . DULoxetine  60 mg Oral QHS  . enoxaparin (LOVENOX) injection  30 mg Subcutaneous Q24H  . estrogens (conjugated)  0.625 mg Oral Daily   And  . medroxyPROGESTERone  2.5 mg Oral Daily  . famotidine  20 mg Oral Daily  . febuxostat  40 mg Oral QHS  . levothyroxine  150 mcg Oral QAC breakfast  . pantoprazole  40 mg Oral QAC breakfast  . pregabalin  25 mg Oral TID  . senna-docusate  1 tablet Oral QHS   Continuous Infusions: . sodium chloride 10 mL/hr at 06/24/18 2004  . sodium chloride 500 mL (06/25/18 1332)     LOS: 1 day    Time spent:25 mins. More than 50% of that time was spent in counseling and/or coordination of care.      Shelly Coss, MD [facility] Pager [phone]  If 7PM-7AM, please contact night-coverage [email or web address] Password [email or web address] 06/25/2018, 2:27 PM

## 2018-06-25 NOTE — Progress Notes (Addendum)
[facility] - DAILY PROGRESS NOTE    HISTORY Carolyn Robertson is an 72 y.o. female who presented to the ED on Sunday afternoon with slurred speech and "trouble getting the words out" requiring extra effort to talk. She was unsure whether she woke up this way but her husband first noticed the speech deficit at bout 2 PM. She initially thought that her speech deficit was because she changed her medication, Lyrica and thought that it could be related. Later on Sunday the symptoms were worse and she could not form sentences, seeming to be saying the wrong words, with more effort required for her to construct sentences. In the ED she was alert and oriented. She would answer questions incorrectly at times on first try but was able to immediately correct herself. No weakness was noted by Triage nurse. on assessment. The patient also endorsed a tendency to fall backward.   She denies having had arm or leg weakness, trouble with vision, headache, CP, SOB or limb pain. She states that she has chronic neck pain.   SUBJECTIVE Patient awake alert oriented x3 no acute distress.  Patient with good bed mobility, able to stand and ambulate to bed with some left leg pain.  Patient denies dizziness, dysarthria, headaches, lightheadedness, nausea, vomiting or blurred vision or extremity weakness today.  Patient states that her mind feels clear and his speech  And no word finding has improved since coming to the ED.  OBJECTIVE Most recent Vital Signs: Vitals:   06/25/18 0328 06/25/18 0804 06/25/18 0829 06/25/18 1200  BP: 135/68 113/74 106/66 (!) 83/57  Pulse: 86 99 (!) 101 96  Resp: 18   17  Temp: 98.2 F (36.8 C)   98.3 F (36.8 C)  TempSrc: Oral   Oral  SpO2: 96%   97%  Weight:      Height:       CBG (last 3)  Recent Labs    06/24/18 1346  GLUCAP 92    Physical Exam  HEENT-  Normocephalic, no lesions, without obvious  abnormality.  Normal external eye and conjunctiva.   Cardiovascular- S1-S2 audible, pulses palpable throughout   Lungs-no rhonchi or wheezing noted, no excessive working breathing.  Saturations within normal limits Abdomen- All 4 quadrants palpated and nontender Musculoskeletal-  Right knee tender due to arthritis,  Skin-warm and dry  Neuro:  Mental Status: Alert, oriented, thought content appropriate.  Speech fluent without evidence of aphasia.  Able to follow 3 step commands without difficulty. Cranial Nerves: II:  Visual fields grossly normal,  III,IV, VI: ptosis not present, extra-ocular motions intact bilaterally pupils equal, round, reactive to light and accommodation V,VII: smile symmetric, facial light touch sensation normal bilaterally VIII: hearing intact to voice IX,X: uvula rises symmetrically XI: bilateral shoulder shrug XII: midline tongue extension Motor: She does move all extremities equally except for decreased range of motion in the right knee secondary to chronic arthritis Right : Upper extremity   4/5    Left:     Upper extremity   4/5  Lower extremity   4/5     Lower extremity   4/5 Tone and bulk:normal tone throughout; no atrophy noted Sensory: Pinprick and  light touch intact throughout, bilaterally Deep Tendon Reflexes: 2+ and symmetric throughout Plantars: Right: downgoing   Left: downgoing Cerebellar: normal finger-to-nose,  Gait: Slightly impaired due to right knee pain  IV Fluid Intake:   . sodium chloride 10 mL/hr at 06/24/18 2004    MEDICATIONS  . aspirin EC  81 mg Oral Daily  . doxycycline  100 mg Oral Q12H  . DULoxetine  60 mg Oral QHS  . enoxaparin (LOVENOX) injection  30 mg Subcutaneous Q24H  . estrogens (conjugated)  0.625 mg Oral Daily   And  . medroxyPROGESTERone  2.5 mg Oral Daily  . famotidine  20 mg Oral Daily  . febuxostat  40 mg Oral QHS  . levothyroxine  150 mcg Oral QAC breakfast  . pantoprazole  40 mg Oral QAC breakfast  .  pregabalin  25 mg Oral TID  . senna-docusate  1 tablet Oral QHS   PRN:  acetaminophen **OR** acetaminophen (TYLENOL) oral liquid 160 mg/5 mL **OR** acetaminophen, HYDROcodone-acetaminophen  Diet:   Diet Order            Diet Heart Room service appropriate? Yes; Fluid consistency: Thin  Diet effective now               CLINICALLY SIGNIFICANT STUDIES Basic Metabolic Panel:  Recent Labs  Lab 06/24/18 1239 06/25/18 0548  NA 141 144  K 4.0 4.1  CL 116* 114*  CO2 15* 18*  GLUCOSE 113* 96  BUN 33* 35*  CREATININE 2.78* 2.41*  CALCIUM 8.5* 8.5*   Liver Function Tests:  Recent Labs  Lab 06/24/18 1239  AST 17  ALT 11  ALKPHOS 71  BILITOT 0.5  PROT 5.6*  ALBUMIN 2.9*   CBC:  Recent Labs  Lab 06/24/18 1239  WBC 9.9  NEUTROABS 7.2  HGB 12.2  HCT 39.4  MCV 97.3  PLT 437*   Coagulation:  Recent Labs  Lab 06/24/18 1239  LABPROT 13.7  INR 1.06   Cardiac Enzymes: No results for input(s): CKTOTAL, CKMB, CKMBINDEX, TROPONINI in the last 168 hours. Urinalysis:  Recent Labs  Lab 06/24/18 1707  COLORURINE YELLOW  LABSPEC 1.012  PHURINE 5.0  GLUCOSEU NEGATIVE  HGBUR LARGE*  BILIRUBINUR NEGATIVE  KETONESUR NEGATIVE  PROTEINUR 30*  NITRITE NEGATIVE  LEUKOCYTESUR LARGE*   Lipid Panel    Component Value Date/Time   CHOL 139 06/25/2018 0548   TRIG 94 06/25/2018 0548   HDL 71 06/25/2018 0548   CHOLHDL 2.0 06/25/2018 0548   VLDL 19 06/25/2018 0548   LDLCALC 49 06/25/2018 0548    Ct Head Wo Contrast 06/24/2018 IMPRESSION:  1. No definite acute intracranial findings. 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 2. 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   MrI Brain/ Mra Head Wo Contrast : 06/25/2018 IMPRESSION:  1. 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  2. Normal intracranial  MRA.   CAROTID DOPPLER 1-39% ICA plaquing. Vertebral artery flow is antegrade.   EKG :ST with PVC  Outstanding Stroke Work-up Studies:     Echocardiogram:   - Left ventricle: The cavity size was normal. Systolic function was   vigorous. The estimated ejection fraction was in the range of 65%   to 70%. There was dynamic obstruction at rest, with a peak   velocity of 139 cm/sec and a peak gradient of 8 mm Hg. Wall   motion  was normal; there were no regional wall motion   abnormalities. Doppler parameters are consistent with abnormal   left ventricular relaxation (grade 1 diastolic dysfunction).   Doppler parameters are consistent with indeterminate ventricular   filling pressure. - Aortic valve: Transvalvular velocity was within the normal range.   There was no stenosis. There was no regurgitation. Valve area   (VTI): 2.25 cm^2. Valve area (Vmax): 2.08 cm^2. Valve area   (Vmean): 1.89 cm^2. - Mitral valve: Transvalvular velocity was within the normal range.   There was no evidence for stenosis. There was no regurgitation. - Right ventricle: The cavity size was normal. Wall thickness was   normal. Systolic function was normal. - Tricuspid valve: There was mild regurgitation. - Pulmonary arteries: Systolic pressure was within the normal   range. PA peak pressure: 27 mm Hg (S).   ASSESSMENT/PLAN Encephalopathy likely due to UTI. Less likely TIA  Code Stroke: No  CT of the brain: small remote lacunar infarcts in the right thalamus and right cerebellum, other wise no acute findings.  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   MRA of the brain : Normal Intracranial MRA  Carotid Doppler unremarkable   2D Echocardiogram EF 65-70%  EEG normal  LDL 49  HgbA1c: pending  VTE : Lovenox  Antiplatelets: ASA 81mg  - continue ASA on discharge for stroke prevention as pt does have old  infarct on MRI  Atorvastatin : None  Continue Rehab with PT consult, OT consult, Speech consult  Therapy recommendations:  Outpatient PT/ST/Home health OT;Supervision for mobility/OOB  Disposition:  Home   Hypertension but hypotensive this am   Home medications: Micardis  BP long term goal normotensive   Held home blood pressure medications due to hypotension so far    Urinary Tract Infection  Patient states 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 chronic and occurs every several months  UA WBC > 50 cloudy  Urine culture pending  Patient currently on doxycycline, discussed changing antibiotics with IMS physician and agreeable with Rocephin   Other Stroke Risk Factors  Advanced age   Other Active Problems  Stage IV CKD  Hypothyroidism  Primary localized osteoarthritis of the right knee   Hospital day # 1  SIGNED Letha Cape [facility] Neuro-hospitalist Team [phone] 06/25/2018, 3:27 PM   06/25/2018 ATTENDING ASSESSMENT   ATTENDING NOTE: I reviewed above note and agree with the assessment and plan. I have made any additions or clarifications directly to the above note. Pt was seen and examined.   72 year old female with history of anxiety, hypertension, CKD stage IV, frequent UTI admitted for speech difficulty in the setting of UTI.  CT no acute infarct but old right thalamic and right cerebellum lacunar infarcts.  MRI showed old right cerebellar lacunar infarct but no acute stroke.  MRA negative.  Carotid Doppler unremarkable EF 65 to 70%.  LDL 49 and A1c pending.  She was found to have UTI, and currently on antibiotics.  Patient symptoms much improved, near baseline on examination.  Patient symptoms most likely due to encephalopathy in the setting of UTI.  Less likely TIA given symptom duration without MRI acute abnormality.  However recommend continue aspirin 81 for stroke prevention.  Patient also on long-term estrogen use for her vaginal dryness and urinary incontinence.  Discussed with  her about potential risk of clotting in the use of estrogen.  She would like to continue aspirin and waiting to discuss with her GYN  as outpatient.  Neurology will sign off. Please call with questions.  No neurology follow-up needed at the time. Thanks for the consult.   Rosalin Hawking, MD PhD Stroke Neurology 06/25/2018 8:00 PM       To contact Stroke Continuity provider, please refer to [email or web address] After hours, contact General Neurology

## 2018-06-25 NOTE — Evaluation (Signed)
Speech Language Pathology Evaluation Patient Details Name: Carolyn Robertson MRN: [id number] DOB: 1945/12/28 Today's Date: 06/25/2018 Time: 1020-1049 SLP Time Calculation (min) (ACUTE ONLY): 29 min  Problem List:  Patient Active Problem List   Diagnosis Date Noted  . CVA (cerebrovascular accident) ([place]) 06/24/2018    Class: Acute  . Lumbar herniated disc 04/12/2018  . Stage 4 chronic kidney disease ([place]) 02/19/2018  . Chronic venous insufficiency 02/19/2018  . Primary localized osteoarthritis of right knee 11/14/2016   Past Medical History:  Past Medical History:  Diagnosis Date  . Anxiety   . Arthritis   . 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 GERD (gastroesophageal reflux disease)   . History of kidney stones   . Hypertension   . Hypothyroidism   . Incontinent of urine    incontinent at night  . Renal disorder    stage IV kidney disease  . Thyroid disease    Past Surgical History:  Past Surgical History:  Procedure Laterality Date  . CHOLECYSTECTOMY    . DILATION AND CURETTAGE OF UTERUS    . EYE SURGERY Right    retina surgery  . KNEE SURGERY     bilat knees  . LUMBAR LAMINECTOMY/DECOMPRESSION MICRODISCECTOMY Right 04/12/2018   Procedure: MICRODISCECTOMY LUMBAR FOUR- LUMBAR FIVE;  Surgeon: Newman Pies, MD;  Location: [facility];  Service: Neurosurgery;  Laterality: Right;  MICRODISCECTOMY LUMBAR FOUR- LUMBAR FIVE  . TONSILLECTOMY     HPI:  72 yo female adm to [facility] with word finding deficits, inability to write.  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  Speech eval ordered.    Assessment / Plan / Recommendation Clinical Impression  Patient presents with mild expressive language deficits resulting in dysfluency - She was only able to name 4 words staring with F in 60 seconds acknowledging this would've been easy prior to admit.  Score on blind MOCA was 14/22 as pt demonstrated diffiuclties with attention,   As pt  reports communication is important to her and she demonstrates expressive language deficits, she will benefit from OP SLP.  Informed pt and husband to results and plan.     SLP Assessment  SLP Recommendation/Assessment: Patient needs continued Speech Lanaguage Pathology Services SLP Visit Diagnosis: Aphasia (R47.01)    Follow Up Recommendations  Outpatient SLP    Frequency and Duration min 1 x/week  1 week      SLP Evaluation Cognition  Overall Cognitive Status: Impaired/Different from baseline Arousal/Alertness: Awake/alert Orientation Level: Oriented to person;Oriented to place;Oriented to situation Attention: Sustained Sustained Attention: Impaired Memory: Impaired Memory Impairment: Retrieval deficit(able to recall 3/5 words independently, 2/5 with cue) Awareness: Appears intact Problem Solving: Appears intact Safety/Judgment: Appears intact       Comprehension  Auditory Comprehension Overall Auditory Comprehension: Appears within functional limits for tasks assessed Yes/No Questions: Not tested Commands: Not tested Conversation: Simple Visual Recognition/Discrimination Discrimination: Not tested Reading Comprehension Reading Status: Within funtional limits    Expression Expression Primary Mode of Expression: Verbal Verbal Expression Overall Verbal Expression: Appears within functional limits for tasks assessed Initiation: No impairment Level of Generative/Spontaneous Verbalization: Sentence Repetition: No impairment Naming: No impairment Pragmatics: No impairment Written Expression Dominant Hand: Right Written Expression: Not tested   Oral / Motor  Oral Motor/Sensory Function Overall Oral Motor/Sensory Function: Within functional limits Motor Speech Overall Motor Speech: Appears within functional limits for tasks assessed Respiration: Within functional limits Phonation: Normal Resonance: Within functional limits Articulation: Within functional  limitis Intelligibility: Intelligible Motor Planning: Witnin functional limits Motor Speech Errors: Not applicable Interfering Components: Premorbid status   GO                    Macario Golds 06/25/2018, 11:10 AM  Luanna Salk, [facility] SLP [phone]

## 2018-06-25 NOTE — Progress Notes (Signed)
2D Echocardiogram has been performed.  Carolyn Robertson 06/25/2018, 2:30 PM

## 2018-06-25 NOTE — Evaluation (Signed)
Physical Therapy Evaluation Patient Details Name: Carolyn Robertson MRN: [id number] DOB: 1946/09/22 Today's Date: 06/25/2018   History of Present Illness  72 yo female admitted with slurred speech and workup for CVA underway PMH:  Clinical Impression  Patient presents with subjective speech deficits, generalized debility and chronic back/knee pain impacting mobility. Pt Mod I with ADLs and ambulation PTA but does report recent fall at home. Tolerated gait training with use of RW today with min guard assist for safety. Encouraged use of RW at home instead of cane. Pt has 13 steps to navigate to enter home and reports trouble with these- sometimes crawling up them. Planning to move. Would benefit from OPPT to improve overall strengthening so pt can maximize independence and be able to enter/leave home safely. Will follow acutely.     Follow Up Recommendations Outpatient PT;Supervision for mobility/OOB    Equipment Recommendations  None recommended by PT    Recommendations for Other Services       Precautions / Restrictions Precautions Precautions: Fall Precaution Comments: reports recent fall from chair  Restrictions Weight Bearing Restrictions: No      Mobility  Bed Mobility Overal bed mobility: Needs Assistance Bed Mobility: Supine to Sit     Supine to sit: Supervision;HOB elevated     General bed mobility comments: No assist needed. Use of rail.  Transfers Overall transfer level: Needs assistance Equipment used: Rolling walker (2 wheeled) Transfers: Sit to/from Stand Sit to Stand: Supervision         General transfer comment: Supervision for safety. Stood from [employer].   Ambulation/Gait Ambulation/Gait assistance: Min guard Gait Distance (Feet): 120 Feet Assistive device: Rolling walker (2 wheeled) Gait Pattern/deviations: Step-through pattern;Decreased stride length Gait velocity: decreased   General Gait Details: Slow, mildly unsteady gait with RW for support.  2 standing rest breaks. Bil knee pain/back pain-chronic  Stairs            Wheelchair Mobility    Modified Rankin (Stroke Patients Only) Modified Rankin (Stroke Patients Only) Pre-Morbid Rankin Score: Moderately severe disability Modified Rankin: Moderately severe disability     Balance Overall balance assessment: Needs assistance Sitting-balance support: Feet supported;No upper extremity supported Sitting balance-Leahy Scale: Good     Standing balance support: During functional activity;Single extremity supported Standing balance-Leahy Scale: Fair Standing balance comment: Requires at least 1 UE support for dynamic tasks.                              Pertinent Vitals/Pain Pain Assessment: Faces Pain Score: 5  Faces Pain Scale: Hurts little more Pain Location: back  Pain Descriptors / Indicators: Constant Pain Intervention(s): Monitored during session;Repositioned    Home Living Family/patient expects to be discharged to:: Private residence Living Arrangements: Spouse/significant other Available Help at Discharge: Family;Available 24 hours/day Type of Home: House Home Access: Stairs to enter Entrance Stairs-Rails: Left Entrance Stairs-Number of Steps: 13 Home Layout: One level Home Equipment: Cane - single point;Walker - 4 wheels;Walker - 2 wheels;Wheelchair - manual      Prior Function Level of Independence: Needs assistance   Gait / Transfers Assistance Needed: husband assists patient step over tub; uses SPC for community ambulation and funriture walking for household ambulation.  ADL's / Homemaking Assistance Needed: independent with ADL's        Hand Dominance   Dominant Hand: Right    Extremity/Trunk Assessment   Upper Extremity Assessment Upper Extremity Assessment: Defer to OT evaluation  Lower Extremity Assessment Lower Extremity Assessment: Generalized weakness    Cervical / Trunk Assessment Cervical / Trunk Assessment:  Other exceptions Cervical / Trunk Exceptions: hx of back pain and neck   Communication   Communication: Expressive difficulties  Cognition Arousal/Alertness: Awake/alert Behavior During Therapy: WFL for tasks assessed/performed Overall Cognitive Status: Within Functional Limits for tasks assessed                                        General Comments General comments (skin integrity, edema, etc.): Spouse present during session.     Exercises     Assessment/Plan    PT Assessment Patient needs continued PT services  PT Problem List Decreased strength;Decreased mobility;Pain;Decreased balance;Decreased knowledge of use of DME;Decreased activity tolerance       PT Treatment Interventions Functional mobility training;Balance training;Patient/family education;Gait training;Therapeutic activities;Stair training;Therapeutic exercise;Neuromuscular re-education;DME instruction    PT Goals (Current goals can be found in the Care Plan section)  Acute Rehab PT Goals Patient Stated Goal: to be able to keep moving PT Goal Formulation: With patient Time For Goal Achievement: 07/09/18 Potential to Achieve Goals: Good    Frequency Min 3X/week   Barriers to discharge Inaccessible home environment 13 stairs to enterhome    Co-evaluation               AM-PAC PT "6 Clicks" Daily Activity  Outcome Measure Difficulty turning over in bed (including adjusting bedclothes, sheets and blankets)?: None Difficulty moving from lying on back to sitting on the side of the bed? : None Difficulty sitting down on and standing up from a chair with arms (e.g., wheelchair, bedside commode, etc,.)?: A Little Help needed moving to and from a bed to chair (including a wheelchair)?: A Little Help needed walking in hospital room?: A Little Help needed climbing 3-5 steps with a railing? : A Little 6 Click Score: 20    End of Session Equipment Utilized During Treatment: Gait belt Activity  Tolerance: Patient tolerated treatment well Patient left: in bed;with call bell/phone within reach;with family/visitor present Nurse Communication: Mobility status PT Visit Diagnosis: Muscle weakness (generalized) (M62.81);Difficulty in walking, not elsewhere classified (R26.2)    Time: 0630-1601 PT Time Calculation (min) (ACUTE ONLY): 25 min   Charges:   PT Evaluation $PT Eval Low Complexity: 1 Low PT Treatments $Gait Training: 8-22 mins        Wray Kearns, PT, DPT Acute Rehabilitation Services Pager [phone] Office [place] 06/25/2018, 9:04 AM

## 2018-06-25 NOTE — Procedures (Signed)
ELECTROENCEPHALOGRAM REPORT   Patient: Carolyn Robertson       Room #: 6R44R EEG No. ID: [id number] Age: 72 y.o.        Sex: female Referring Physician: Tawanna Solo Report Date:  06/25/2018        Interpreting Physician: Alexis Goodell  History: Carolyn Robertson is an 72 y.o. female with difficulties with speech  Medications:  ASA, Rocephin, Cymbalta, Premarin, Pepcid, Uloric, Synthroid, Provera, Protonix, Lyrica, Senokot  Conditions of Recording:  This is a 21 channel routine scalp EEG performed with bipolar and monopolar montages arranged in accordance to the international 10/20 system of electrode placement. One channel was dedicated to EKG recording.  The patient is in the awake and drowsy states.  Description:  The waking background activity consists of a low voltage, symmetrical, fairly well organized, 8 Hz alpha activity, seen from the parieto-occipital and posterior temporal regions.  Low voltage fast activity, poorly organized, is seen anteriorly and is at times superimposed on more posterior regions.  A mixture of theta and alpha rhythms are seen from the central and temporal regions.  There were intermittent periods of slowing noted.  These were felt to be synchronous over both hemispheres but at times this was difficult to ascertain due to the persistent artifact noted over the right hemisphere.  These periods of slowing were short lived and consisted of low to moderate voltage polymorphic delta activity.   Stage II sleep is not obtained. No epileptiform activity is noted.   Hyperventilation and intermittent photic stimulation were not performed.  IMPRESSION: This is a normal awake electroencephalogram with periods of slowing that likely represent drowse.  No epileptiform activity is noted.     Alexis Goodell, MD Neurology [phone] 06/25/2018, 4:05 PM

## 2018-06-26 DIAGNOSIS — N390 Urinary tract infection, site not specified: Principal | ICD-10-CM

## 2018-06-26 LAB — HEMOGLOBIN A1C
Hgb A1c MFr Bld: 5.5 % (ref 4.8–5.6)
MEAN PLASMA GLUCOSE: 111 mg/dL

## 2018-06-26 MED ORDER — CEFPODOXIME PROXETIL 100 MG PO TABS: 100.0000 mg | ORAL_TABLET | Freq: Every day | ORAL | 0 refills | Status: DC

## 2018-06-26 MED ORDER — ASPIRIN 81 MG PO TBEC: 81.0000 mg | DELAYED_RELEASE_TABLET | Freq: Every day | ORAL | 0 refills | Status: DC

## 2018-06-26 MED ORDER — LYRICA 25 MG PO CAPS: 25.0000 mg | ORAL_CAPSULE | Freq: Every day | ORAL | 2 refills | Status: DC

## 2018-06-26 NOTE — Plan of Care (Signed)
Min assist with adls 

## 2018-06-26 NOTE — Care Management Note (Signed)
Case Management Note  Patient Details  Name: Carolyn Robertson MRN: [id number] Date of Birth: April 01, 1946  Subjective/Objective:    Pt admitted with TIA and UTI. She is from home with spouse.      PCP:  Dr Reynaldo Minium Insurance: medicare           Action/Plan: CM consulted for outpatient therapy. CM met with the patient and she would like to attend [facility]. Orders in Epic and information on the AVS.  Pt with orders for walker. James with [facility] DME notified and will deliver to the room. Spouse can provide intermittent supervision at home and transportation to home.   Expected Discharge Date:  06/26/18               Expected Discharge Plan:  OP Rehab  In-House Referral:     Discharge planning Services  CM Consult  Post Acute Care Choice:  Durable Medical Equipment Choice offered to:  Patient  DME Arranged:  [facility]ford Rile rolling DME Agency:  [place]:    [place]:     Status of Service:  Completed, signed off  If discussed at [place] of Stay Meetings, dates discussed:    Additional Comments:  Pollie Friar, RN 06/26/2018, 2:11 PM

## 2018-06-26 NOTE — Discharge Summary (Signed)
Physician Discharge Summary  Carolyn Robertson RUE:[id number] DOB: 1946-09-22 DOA: 06/24/2018  PCP: Burnard Bunting, MD  Admit date: 06/24/2018 Discharge date: 06/26/2018  Time spent: >35 minutes  Recommendations for Outpatient Follow-up:  PCP in 3-7 days   Discharge Diagnoses:  Principal Problem:   TIA (transient ischemic attack) Active Problems:   Stage 4 chronic kidney disease ([place])   Primary localized osteoarthritis of right knee   Discharge Condition: stable   Diet recommendation: low sodium   Filed Weights   06/24/18 1347  Weight: 72.6 kg    History of present illness:   72 year old female with past medical history of osteoarthritis, hypertension, hypothyroidism, CKD stage IV who presented to the emergency department with complaints of difficulty finding words.MRI of the brain did not show any acute stroke.  Neurology following .underwent work-up for TIA . Found to have Urinary tract infection    Hospital Course:    TIA: Presented with word finding difficulty.  Currently this problem has resolved.  Patient does not have any focal neurological deficits right now. 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 Neurology following and recommending echocardiogram, carotid Doppler. Patient evaluated by PT/OT and recommended outpatient physical therapy. Continue aspirin 81 mg daily. LDL of 49.    Stage IV CKD: Currently her kidney function is on baseline.  Follows with [facility].  Hypotension: resolved .  Possible UTI: Denies any dysuria.  Urinalysis was suggestive of UTI.  no urine cultures were obtain on admission. Received ceftriaxone. Remained afebrile. Transitioned  to oral regimen to complete the treatment course   Chronic pains. Osteoarthritis of right knee: Continue supportive care.  Pain management. Patient is chronically on cymbalta 60mg . Recently increased lyrica to TUD.  D/w patient. I have recommended to to decrease lyrica, cymbalta. She wants to discuss her pain regimen with her PCP  Hypothyroidism: Continue Synthyroid.   Procedures:  Echo  (i.e. Studies not automatically included, echos, thoracentesis, etc; not x-rays)  Consultations:  Neurology   Discharge Exam: Vitals:   06/26/18 0817 06/26/18 1211  BP: 119/68 121/69  Pulse: 87 89  Resp: 16 15  Temp: 98.1 F (36.7 C) 98 F (36.7 C)  SpO2: 100% 98%    General: no distress  Cardiovascular: s1,s2 rrr Respiratory: CTA BL  Discharge Instructions  Discharge Instructions    Ambulatory referral to Occupational Therapy   Complete by:  As directed    Ambulatory referral to Physical Therapy   Complete by:  As directed    Diet - low sodium heart healthy   Complete by:  As directed    Increase activity slowly   Complete by:  As directed      Allergies as of 06/26/2018      Reactions   Other Swelling   A bandage/gauze that was used on lower extremity wound at [facility] health, unsure type- Pt states her foot turned red and swelled up   Penicillins Swelling, Rash   Has patient had a PCN reaction causing immediate rash, facial/tongue/throat swelling, SOB or lightheadedness with hypotension: Unknown Has patient had a PCN reaction causing severe rash involving mucus membranes or skin necrosis: Unknown Has patient had a PCN reaction that required hospitalization: No Has patient had a PCN reaction occurring within the last 10 years: No If all of the above answers are "NO", then may proceed with Cephalosporin use.   Sulfa Antibiotics Itching, Swelling   Codeine Nausea Only   Hydrocodone-acetaminophen Other (See  Comments)   Causes hallucinations   Naproxen Sodium Swelling, Other (See Comments)   (ALEVE) Red Man Syndrome   Tape Rash, Other (See Comments)   BURNS SKIN --PAPER TAPE IS OKAY      Medication List    STOP taking these medications   oxybutynin 5 MG 24 hr tablet Commonly known  as:  DITROPAN-XL   Oxycodone HCl 10 MG Tabs   traMADol 50 MG tablet Commonly known as:  ULTRAM     TAKE these medications   aspirin 81 MG EC tablet Take 1 tablet (81 mg total) by mouth daily. Start taking on:  06/27/2018   baclofen 10 MG tablet Commonly known as:  LIORESAL Take 10 mg by mouth daily as needed for muscle spasms.   cefpodoxime 100 MG tablet Commonly known as:  VANTIN Take 1 tablet (100 mg total) by mouth daily.   cyanocobalamin 1000 MCG/ML injection Commonly known as:  (VITAMIN B-12) Inject 1,000 mcg into the muscle every 30 (thirty) days.   docusate sodium 100 MG capsule Commonly known as:  COLACE Take 1 capsule (100 mg total) by mouth 2 (two) times daily.   DULoxetine 60 MG capsule Commonly known as:  CYMBALTA Take 60 mg by mouth at bedtime.   estrogen (conjugated)-medroxyprogesterone 0.625-2.5 MG tablet Commonly known as:  PREMPRO Take 1 tablet by mouth daily.   febuxostat 40 MG tablet Commonly known as:  ULORIC Take 40 mg by mouth at bedtime.   furosemide 40 MG tablet Commonly known as:  LASIX Take 40 mg by mouth daily.   HYDROcodone-acetaminophen 5-325 MG tablet Commonly known as:  NORCO/VICODIN Take 1 tablet by mouth every 4 (four) hours as needed for pain. Every 4-6 hrs as needed   LYRICA 25 MG capsule Generic drug:  pregabalin Take 1 capsule (25 mg total) by mouth daily. What changed:  when to take this   pantoprazole 40 MG tablet Commonly known as:  PROTONIX Take 40 mg by mouth daily before breakfast.   ranitidine 150 MG tablet Commonly known as:  ZANTAC Take 150 mg by mouth as needed for heartburn.   SYNTHROID 150 MCG tablet Generic drug:  levothyroxine Take 150 mcg by mouth daily before breakfast.   telmisartan 20 MG tablet Commonly known as:  MICARDIS Take 20 mg by mouth at bedtime.            Durable Medical Equipment  (From admission, onward)         Start     Ordered   06/26/18 1138  For home use only DME  Walker rolling  Once    Question:  Patient needs a walker to treat with the following condition  Answer:  Weakness   06/26/18 1139         Allergies  Allergen Reactions  . Other Swelling    A bandage/gauze that was used on lower extremity wound at [facility] health, unsure type- Pt states her foot turned red and swelled up  . Penicillins Swelling and Rash    Has patient had a PCN reaction causing immediate rash, facial/tongue/throat swelling, SOB or lightheadedness with hypotension: Unknown Has patient had a PCN reaction causing severe rash involving mucus membranes or skin necrosis: Unknown Has patient had a PCN reaction that required hospitalization: No Has patient had a PCN reaction occurring within the last 10 years: No If all of the above answers are "NO", then may proceed with Cephalosporin use.   . Sulfa Antibiotics Itching and Swelling  . Codeine Nausea  Only  . Hydrocodone-Acetaminophen Other (See Comments)    Causes hallucinations  . Naproxen Sodium Swelling and Other (See Comments)    (ALEVE) Red Man Syndrome  . Tape Rash and Other (See Comments)    BURNS SKIN --PAPER TAPE IS OKAY   Follow-up Information    [facility].   Specialty:  Rehabilitation Why:  They will contact you for the first appointment Contact information: [address] [place] [phone]           The results of significant diagnostics from this hospitalization (including imaging, microbiology, ancillary and laboratory) are listed below for reference.    Significant Diagnostic Studies: Ct Head Wo Contrast  Result Date: 06/24/2018 CLINICAL DATA:  Slurred speech.  Expressive aphasia. EXAM: CT HEAD WITHOUT CONTRAST TECHNIQUE: Contiguous axial images were obtained from the base of the skull through the vertex without intravenous contrast. COMPARISON:  Multiple exams, including 02/25/2016 FINDINGS:  Brain: Small remote right cerebellar lacunar infarct on image 10/3, unchanged. Small focal fairly sharply defined hypodensity in the right thalamus, 4 mm in diameter, probably a small lacunar infarct. Periventricular white matter and corona radiata hypodensities favor chronic ischemic microvascular white matter disease. No intracranial hemorrhage, mass lesion, or acute CVA. Vascular: Unremarkable Skull: Unremarkable Sinuses/Orbits: Minimal chronic left maxillary sinusitis. Chronic bilateral sphenoid sinusitis. Other: No supplemental non-categorized findings. IMPRESSION: 1. No definite acute intracranial findings. 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 2. 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 Electronically Signed   By: Van Clines M.D.   On: 06/24/2018 13:37   Mr Jodene Nam Head Wo Contrast  Result Date: 06/25/2018 CLINICAL DATA:  Transient ischemic attack EXAM: MRI HEAD WITHOUT CONTRAST MRA HEAD WITHOUT CONTRAST TECHNIQUE: Multiplanar, multiecho pulse sequences of the brain and surrounding structures were obtained without intravenous contrast. Angiographic images of the head were obtained using MRA technique without contrast. COMPARISON:  Head CT 06/24/2018 FINDINGS: MRI HEAD FINDINGS BRAIN: There is no acute infarct, acute hemorrhage or mass effect. The midline structures are normal. There is an old right cerebellar infarct. Early confluent hyperintense T2-weighted signal of the periventricular and deep white matter, most commonly due to chronic ischemic microangiopathy. Generalized atrophy without lobar predilection. Susceptibility-sensitive sequences show no chronic microhemorrhage or superficial siderosis. SKULL AND UPPER CERVICAL SPINE: The visualized skull base, calvarium, upper cervical spine and extracranial soft tissues are normal. SINUSES/ORBITS: No fluid levels or advanced mucosal  thickening. No mastoid or middle ear effusion. The orbits are normal. MRA HEAD FINDINGS Intracranial internal carotid arteries: Normal. Anterior cerebral arteries: Normal. Middle cerebral arteries: Normal. Posterior communicating arteries: Present bilaterally. Posterior cerebral arteries: Normal. Basilar artery: Normal. Vertebral arteries: Left dominant. Normal. Superior cerebellar arteries: Normal. Inferior cerebellar arteries: Left AICA not clearly visualized, which is not uncommon. Otherwise normal. IMPRESSION: 1. 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 2. Normal intracranial MRA. Electronically Signed   By: Ulyses Jarred M.D.   On: 06/25/2018 01:53   Mr Brain Wo Contrast  Result Date: 06/25/2018 CLINICAL DATA:  Transient ischemic attack EXAM: MRI HEAD WITHOUT CONTRAST MRA HEAD WITHOUT CONTRAST TECHNIQUE: Multiplanar, multiecho pulse sequences of the brain and surrounding structures were obtained without intravenous contrast. Angiographic images of the head were obtained using MRA technique without contrast. COMPARISON:  Head CT 06/24/2018 FINDINGS: MRI HEAD FINDINGS BRAIN: There is no acute infarct, acute  hemorrhage or mass effect. The midline structures are normal. There is an old right cerebellar infarct. Early confluent hyperintense T2-weighted signal of the periventricular and deep white matter, most commonly due to chronic ischemic microangiopathy. Generalized atrophy without lobar predilection. Susceptibility-sensitive sequences show no chronic microhemorrhage or superficial siderosis. SKULL AND UPPER CERVICAL SPINE: The visualized skull base, calvarium, upper cervical spine and extracranial soft tissues are normal. SINUSES/ORBITS: No fluid levels or advanced mucosal thickening. No mastoid or middle ear effusion. The orbits are normal. MRA HEAD FINDINGS Intracranial internal carotid arteries: Normal. Anterior cerebral  arteries: Normal. Middle cerebral arteries: Normal. Posterior communicating arteries: Present bilaterally. Posterior cerebral arteries: Normal. Basilar artery: Normal. Vertebral arteries: Left dominant. Normal. Superior cerebellar arteries: Normal. Inferior cerebellar arteries: Left AICA not clearly visualized, which is not uncommon. Otherwise normal. IMPRESSION: 1. 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 2. Normal intracranial MRA. Electronically Signed   By: Ulyses Jarred M.D.   On: 06/25/2018 01:53    Microbiology: No results found for this or any previous visit (from the past 240 hour(s)).   Labs: Basic Metabolic Panel: Recent Labs  Lab 06/24/18 1239 06/25/18 0548  NA 141 144  K 4.0 4.1  CL 116* 114*  CO2 15* 18*  GLUCOSE 113* 96  BUN 33* 35*  CREATININE 2.78* 2.41*  CALCIUM 8.5* 8.5*   Liver Function Tests: Recent Labs  Lab 06/24/18 1239  AST 17  ALT 11  ALKPHOS 71  BILITOT 0.5  PROT 5.6*  ALBUMIN 2.9*   No results for input(s): LIPASE, AMYLASE in the last 168 hours. No results for input(s): AMMONIA in the last 168 hours. CBC: Recent Labs  Lab 06/24/18 1239  WBC 9.9  NEUTROABS 7.2  HGB 12.2  HCT 39.4  MCV 97.3  PLT 437*   Cardiac Enzymes: No results for input(s): CKTOTAL, CKMB, CKMBINDEX, TROPONINI in the last 168 hours. BNP: BNP (last 3 results) No results for input(s): BNP in the last 8760 hours.  ProBNP (last 3 results) No results for input(s): PROBNP in the last 8760 hours.  CBG: Recent Labs  Lab 06/24/18 1346  GLUCAP 92       Signed:  Kynan Peasley N  [facility] 06/26/2018, 1:55 PM

## 2018-06-26 NOTE — Plan of Care (Signed)
STATES TREATMENT GOALS/OUTCOMES RELATED TO STROKE.

## 2018-06-26 NOTE — Consult Note (Signed)
[facility] CM Primary Care Navigator  06/26/2018  Carolyn Robertson 06/19/1946 [id number]    Met withpatient and husband Carolyn Robertson) at the bedside to identify possible discharge needs. Patientreports thatshe had "garbled speech/ difficulty speaking" that had led tothis admission. 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  (TIA- transient ischemic attack, possible UTI, primary localized osteoarthritis of right knee)  PatientendorsesDr.Richard Reynaldo Robertson with [facility] as the primary care provider.    Ord to obtain medications without any problem.  Patientreportsmanagingher own medications at [employer] out of the containers, but plans to use "pill box" after discharge.  Patientverbalized thathusband has been providingtransportationto her doctors'appointments.    According to patient, husband will be her primary caregiver at home after discharge.  Anticipateddischargeplan ishome withOutpatient Rehab per therapy recommendation.   Patientand husband voiced understanding to call primary care provider's office when she returns homefor apost discharge follow-up visit within1- 2weeksor sooner if needs arise. Patient letter (with PCP's contact number) was provided asareminder.   Explained to patientand husband regardingTHN CM services available for health management and resources at home but bothindicated having no needs or concerns at this point. Patient and husband encouraged to seekreferral to [facility] care managementfrom primary care provider ifdeemed necessary andappropriatefor services in thefuture.  [facility] care management information provided for future needs that patient may have.  Patient however, opted and verbally agreed for [facility] Stroke callsto follow-up recovery at home.  Referral made for [facility] Stroke calls after discharge.    For additional  questions please contact:  Carolyn Robertson, BSN, RN-BC [facility] PRIMARY CARE Navigator Cell: [phone]

## 2018-06-26 NOTE — Progress Notes (Signed)
Physical Therapy Treatment Patient Details Name: Carolyn Robertson MRN: [id number] DOB: [birth year] Today's Date: 06/26/2018    History of Present Illness 72 yo female admitted with slurred speech and aphasia. HEad CT and MRI-unremarkable. Admitted for possible TIA. PMH: HTN, anxiety.    PT Comments    Patient seen for mobility progression. This session focused on gait/stair training. Pt requires assistance for all OOB mobility and requires cues for safe use of AD and navigating environment as pt tends to run into objects on L side. Husband present end of session. Pt will continue to benefit from further skilled PT services to maximize independence and safety with mobility.      Follow Up Recommendations  Outpatient PT;Supervision for mobility/OOB     Equipment Recommendations  Rolling walker with 5" wheels    Recommendations for Other Services       Precautions / Restrictions Precautions Precautions: Fall Precaution Comments: reports recent fall from chair  Restrictions Weight Bearing Restrictions: No    Mobility  Bed Mobility Overal bed mobility: Modified Independent Bed Mobility: Supine to Sit           General bed mobility comments: increased time/effort; use of rail  Transfers Overall transfer level: Needs assistance Equipment used: Rolling walker (2 wheeled) Transfers: Sit to/from Stand Sit to Stand: Mod assist;Min assist         General transfer comment: assist for balance upon standing due to posterior bias with use of Rw; cues for safe hand placement to stand and on RW  Ambulation/Gait Ambulation/Gait assistance: Min guard;Min assist Gait Distance (Feet): (~322ft total) Assistive device: Rolling walker (2 wheeled) Gait Pattern/deviations: Step-through pattern;Decreased stride length;Drifts right/left Gait velocity: decreased   General Gait Details: pt with tendency to drift to L side and cues required to watch for objects on L side when navigating  environment; cues for safe use of AD and posture   Stairs   Stairs assistance: Min assist Stair Management: One rail Left;Step to pattern Number of Stairs: 10 General stair comments: attempted ascending stairs several ways this session including sideways using L rail, L rail and cane and pt ultimately had the least difficulty with "crawling" as she was doing PTA   Wheelchair Mobility    Modified Rankin (Stroke Patients Only) Modified Rankin (Stroke Patients Only) Pre-Morbid Rankin Score: Moderately severe disability Modified Rankin: Moderately severe disability     Balance Overall balance assessment: Needs assistance Sitting-balance support: Feet supported;No upper extremity supported Sitting balance-Leahy Scale: Good     Standing balance support: During functional activity Standing balance-Leahy Scale: Poor                              Cognition Arousal/Alertness: Awake/alert Behavior During Therapy: WFL for tasks assessed/performed Overall Cognitive Status: Within Functional Limits for tasks assessed                                        Exercises      General Comments General comments (skin integrity, edema, etc.): bilat LE edema      Pertinent Vitals/Pain Pain Assessment: Faces Faces Pain Scale: Hurts little more Pain Location: back and knees Pain Descriptors / Indicators: Constant;Aching Pain Intervention(s): Limited activity within patient's tolerance;Monitored during session;Repositioned    Home Living  Prior Function            PT Goals (current goals can now be found in the care plan section) Acute Rehab PT Goals PT Goal Formulation: With patient Time For Goal Achievement: 07/09/18 Potential to Achieve Goals: Good Progress towards PT goals: Progressing toward goals    Frequency    Min 3X/week      PT Plan Current plan remains appropriate    Co-evaluation               AM-PAC PT "6 Clicks" Daily Activity  Outcome Measure  Difficulty turning over in bed (including adjusting bedclothes, sheets and blankets)?: None Difficulty moving from lying on back to sitting on the side of the bed? : None Difficulty sitting down on and standing up from a chair with arms (e.g., wheelchair, bedside commode, etc,.)?: None Help needed moving to and from a bed to chair (including a wheelchair)?: A Lot Help needed walking in hospital room?: A Little Help needed climbing 3-5 steps with a railing? : A Little 6 Click Score: 20    End of Session Equipment Utilized During Treatment: Gait belt Activity Tolerance: Patient tolerated treatment well Patient left: with call bell/phone within reach;in chair;with family/visitor present Nurse Communication: Mobility status PT Visit Diagnosis: Muscle weakness (generalized) (M62.81);Difficulty in walking, not elsewhere classified (R26.2)     Time: 3710-6269 PT Time Calculation (min) (ACUTE ONLY): 39 min  Charges:  $Gait Training: 23-37 mins $Therapeutic Activity: 8-22 mins                     Earney Navy, PTA Acute Rehabilitation Services Pager: [phone]     Darliss Cheney 06/26/2018, 10:24 AM

## 2018-06-28 DIAGNOSIS — M170 Bilateral primary osteoarthritis of knee: Secondary | ICD-10-CM | POA: Diagnosis not present

## 2018-06-28 LAB — URINE CULTURE: Culture: >=100000 — AB

## 2018-06-29 DIAGNOSIS — G8929 Other chronic pain: Secondary | ICD-10-CM | POA: Diagnosis not present

## 2018-06-29 DIAGNOSIS — I129 Hypertensive chronic kidney disease with stage 1 through stage 4 chronic kidney disease, or unspecified chronic kidney disease: Secondary | ICD-10-CM | POA: Diagnosis not present

## 2018-06-29 DIAGNOSIS — K219 Gastro-esophageal reflux disease without esophagitis: Secondary | ICD-10-CM | POA: Diagnosis not present

## 2018-06-29 DIAGNOSIS — Z6828 Body mass index (BMI) 28.0-28.9, adult: Secondary | ICD-10-CM | POA: Diagnosis not present

## 2018-06-29 DIAGNOSIS — F329 Major depressive disorder, single episode, unspecified: Secondary | ICD-10-CM | POA: Diagnosis not present

## 2018-06-29 DIAGNOSIS — R600 Localized edema: Secondary | ICD-10-CM | POA: Diagnosis not present

## 2018-06-29 DIAGNOSIS — G459 Transient cerebral ischemic attack, unspecified: Secondary | ICD-10-CM | POA: Diagnosis not present

## 2018-07-23 DIAGNOSIS — M5136 Other intervertebral disc degeneration, lumbar region: Secondary | ICD-10-CM | POA: Diagnosis not present

## 2018-07-23 DIAGNOSIS — M5416 Radiculopathy, lumbar region: Secondary | ICD-10-CM | POA: Diagnosis not present

## 2018-07-23 DIAGNOSIS — M791 Myalgia, unspecified site: Secondary | ICD-10-CM | POA: Diagnosis not present

## 2018-07-23 DIAGNOSIS — M2550 Pain in unspecified joint: Secondary | ICD-10-CM | POA: Diagnosis not present

## 2018-07-23 DIAGNOSIS — M150 Primary generalized (osteo)arthritis: Secondary | ICD-10-CM | POA: Diagnosis not present

## 2018-07-23 DIAGNOSIS — M1711 Unilateral primary osteoarthritis, right knee: Secondary | ICD-10-CM | POA: Diagnosis not present

## 2018-08-13 DIAGNOSIS — G8929 Other chronic pain: Secondary | ICD-10-CM | POA: Diagnosis not present

## 2018-08-13 DIAGNOSIS — I129 Hypertensive chronic kidney disease with stage 1 through stage 4 chronic kidney disease, or unspecified chronic kidney disease: Secondary | ICD-10-CM | POA: Diagnosis not present

## 2018-08-13 DIAGNOSIS — N390 Urinary tract infection, site not specified: Secondary | ICD-10-CM | POA: Diagnosis not present

## 2018-08-13 DIAGNOSIS — N184 Chronic kidney disease, stage 4 (severe): Secondary | ICD-10-CM | POA: Diagnosis not present

## 2018-08-13 DIAGNOSIS — E038 Other specified hypothyroidism: Secondary | ICD-10-CM | POA: Diagnosis not present

## 2018-08-13 DIAGNOSIS — M7061 Trochanteric bursitis, right hip: Secondary | ICD-10-CM | POA: Diagnosis not present

## 2018-08-13 DIAGNOSIS — I10 Essential (primary) hypertension: Secondary | ICD-10-CM | POA: Diagnosis not present

## 2018-08-13 DIAGNOSIS — G250 Essential tremor: Secondary | ICD-10-CM | POA: Diagnosis not present

## 2018-08-13 DIAGNOSIS — I872 Venous insufficiency (chronic) (peripheral): Secondary | ICD-10-CM | POA: Diagnosis not present

## 2018-08-13 DIAGNOSIS — R251 Tremor, unspecified: Secondary | ICD-10-CM | POA: Diagnosis not present

## 2018-08-13 DIAGNOSIS — Z23 Encounter for immunization: Secondary | ICD-10-CM | POA: Diagnosis not present

## 2018-08-13 DIAGNOSIS — G9529 Other cord compression: Secondary | ICD-10-CM | POA: Diagnosis not present

## 2018-08-13 DIAGNOSIS — R309 Painful micturition, unspecified: Secondary | ICD-10-CM | POA: Diagnosis not present

## 2018-08-14 ENCOUNTER — Other Ambulatory Visit: Payer: Self-pay | Admitting: Internal Medicine

## 2018-08-14 DIAGNOSIS — M7061 Trochanteric bursitis, right hip: Secondary | ICD-10-CM

## 2018-08-24 ENCOUNTER — Ambulatory Visit
Admission: RE | Admit: 2018-08-24 | Discharge: 2018-08-24 | Disposition: A | Discharge status: Home / Self Care | Payer: Medicare Other | Source: Ambulatory Visit | Attending: Internal Medicine | Admitting: Internal Medicine

## 2018-08-24 DIAGNOSIS — M791 Myalgia, unspecified site: Secondary | ICD-10-CM | POA: Diagnosis not present

## 2018-08-24 DIAGNOSIS — M150 Primary generalized (osteo)arthritis: Secondary | ICD-10-CM | POA: Diagnosis not present

## 2018-08-24 DIAGNOSIS — M7061 Trochanteric bursitis, right hip: Secondary | ICD-10-CM

## 2018-08-24 DIAGNOSIS — M2550 Pain in unspecified joint: Secondary | ICD-10-CM | POA: Diagnosis not present

## 2018-08-24 MED ORDER — IOPAMIDOL (ISOVUE-M 200) INJECTION 41%
1.0000 mL | Freq: Once | INTRAMUSCULAR | Status: AC
  Administered 2018-08-24: 1 mL

## 2018-08-24 MED ORDER — METHYLPREDNISOLONE ACETATE 40 MG/ML INJ SUSP (RADIOLOG
120.0000 mg | Freq: Once | INTRAMUSCULAR | Status: AC
  Administered 2018-08-24: 120 mg via INTRA_ARTICULAR

## 2018-10-26 DIAGNOSIS — M5136 Other intervertebral disc degeneration, lumbar region: Secondary | ICD-10-CM | POA: Diagnosis not present

## 2018-10-26 DIAGNOSIS — M2550 Pain in unspecified joint: Secondary | ICD-10-CM | POA: Diagnosis not present

## 2018-10-26 DIAGNOSIS — M150 Primary generalized (osteo)arthritis: Secondary | ICD-10-CM | POA: Diagnosis not present

## 2018-10-26 DIAGNOSIS — M791 Myalgia, unspecified site: Secondary | ICD-10-CM | POA: Diagnosis not present

## 2018-10-29 DIAGNOSIS — H43812 Vitreous degeneration, left eye: Secondary | ICD-10-CM | POA: Diagnosis not present

## 2018-10-29 DIAGNOSIS — H33101 Unspecified retinoschisis, right eye: Secondary | ICD-10-CM | POA: Diagnosis not present

## 2018-10-29 DIAGNOSIS — H353131 Nonexudative age-related macular degeneration, bilateral, early dry stage: Secondary | ICD-10-CM | POA: Diagnosis not present

## 2018-10-29 DIAGNOSIS — H35372 Puckering of macula, left eye: Secondary | ICD-10-CM | POA: Diagnosis not present

## 2018-11-12 DIAGNOSIS — M5416 Radiculopathy, lumbar region: Secondary | ICD-10-CM | POA: Diagnosis not present

## 2018-11-12 DIAGNOSIS — I10 Essential (primary) hypertension: Secondary | ICD-10-CM | POA: Diagnosis not present

## 2018-11-12 DIAGNOSIS — M4316 Spondylolisthesis, lumbar region: Secondary | ICD-10-CM | POA: Diagnosis not present

## 2018-11-12 DIAGNOSIS — Z6825 Body mass index (BMI) 25.0-25.9, adult: Secondary | ICD-10-CM | POA: Diagnosis not present

## 2018-11-21 DIAGNOSIS — M109 Gout, unspecified: Secondary | ICD-10-CM | POA: Diagnosis not present

## 2018-11-21 DIAGNOSIS — M79643 Pain in unspecified hand: Secondary | ICD-10-CM | POA: Diagnosis not present

## 2018-11-21 DIAGNOSIS — M79671 Pain in right foot: Secondary | ICD-10-CM | POA: Diagnosis not present

## 2018-11-21 DIAGNOSIS — M79642 Pain in left hand: Secondary | ICD-10-CM | POA: Diagnosis not present

## 2018-11-21 DIAGNOSIS — E559 Vitamin D deficiency, unspecified: Secondary | ICD-10-CM | POA: Diagnosis not present

## 2018-11-21 DIAGNOSIS — M19071 Primary osteoarthritis, right ankle and foot: Secondary | ICD-10-CM | POA: Diagnosis not present

## 2018-11-21 DIAGNOSIS — M25519 Pain in unspecified shoulder: Secondary | ICD-10-CM | POA: Diagnosis not present

## 2018-11-21 DIAGNOSIS — M7989 Other specified soft tissue disorders: Secondary | ICD-10-CM | POA: Diagnosis not present

## 2018-11-21 DIAGNOSIS — N189 Chronic kidney disease, unspecified: Secondary | ICD-10-CM | POA: Diagnosis not present

## 2018-11-21 DIAGNOSIS — M549 Dorsalgia, unspecified: Secondary | ICD-10-CM | POA: Diagnosis not present

## 2018-11-21 DIAGNOSIS — M79641 Pain in right hand: Secondary | ICD-10-CM | POA: Diagnosis not present

## 2018-11-21 DIAGNOSIS — D8989 Other specified disorders involving the immune mechanism, not elsewhere classified: Secondary | ICD-10-CM | POA: Diagnosis not present

## 2018-11-21 DIAGNOSIS — M353 Polymyalgia rheumatica: Secondary | ICD-10-CM | POA: Diagnosis not present

## 2018-11-21 DIAGNOSIS — M79672 Pain in left foot: Secondary | ICD-10-CM | POA: Diagnosis not present

## 2018-11-21 DIAGNOSIS — M2550 Pain in unspecified joint: Secondary | ICD-10-CM | POA: Diagnosis not present

## 2018-11-21 DIAGNOSIS — M19072 Primary osteoarthritis, left ankle and foot: Secondary | ICD-10-CM | POA: Diagnosis not present

## 2018-11-21 DIAGNOSIS — M25569 Pain in unspecified knee: Secondary | ICD-10-CM | POA: Diagnosis not present

## 2018-11-21 DIAGNOSIS — M1990 Unspecified osteoarthritis, unspecified site: Secondary | ICD-10-CM | POA: Diagnosis not present

## 2018-11-27 DIAGNOSIS — M5126 Other intervertebral disc displacement, lumbar region: Secondary | ICD-10-CM | POA: Diagnosis not present

## 2018-11-27 DIAGNOSIS — M5136 Other intervertebral disc degeneration, lumbar region: Secondary | ICD-10-CM | POA: Diagnosis not present

## 2018-11-27 DIAGNOSIS — M47816 Spondylosis without myelopathy or radiculopathy, lumbar region: Secondary | ICD-10-CM | POA: Diagnosis not present

## 2018-11-27 DIAGNOSIS — M5416 Radiculopathy, lumbar region: Secondary | ICD-10-CM | POA: Diagnosis not present

## 2018-11-30 DIAGNOSIS — M48061 Spinal stenosis, lumbar region without neurogenic claudication: Secondary | ICD-10-CM | POA: Diagnosis not present

## 2018-11-30 DIAGNOSIS — M5416 Radiculopathy, lumbar region: Secondary | ICD-10-CM | POA: Diagnosis not present

## 2018-11-30 DIAGNOSIS — M4316 Spondylolisthesis, lumbar region: Secondary | ICD-10-CM | POA: Diagnosis not present

## 2018-12-07 ENCOUNTER — Other Ambulatory Visit: Payer: Self-pay | Admitting: Neurosurgery

## 2018-12-11 DIAGNOSIS — M25569 Pain in unspecified knee: Secondary | ICD-10-CM | POA: Diagnosis not present

## 2018-12-11 DIAGNOSIS — M549 Dorsalgia, unspecified: Secondary | ICD-10-CM | POA: Diagnosis not present

## 2018-12-11 DIAGNOSIS — M25519 Pain in unspecified shoulder: Secondary | ICD-10-CM | POA: Diagnosis not present

## 2018-12-11 DIAGNOSIS — N189 Chronic kidney disease, unspecified: Secondary | ICD-10-CM | POA: Diagnosis not present

## 2018-12-11 DIAGNOSIS — M353 Polymyalgia rheumatica: Secondary | ICD-10-CM | POA: Diagnosis not present

## 2018-12-11 DIAGNOSIS — M79643 Pain in unspecified hand: Secondary | ICD-10-CM | POA: Diagnosis not present

## 2018-12-11 DIAGNOSIS — M1990 Unspecified osteoarthritis, unspecified site: Secondary | ICD-10-CM | POA: Diagnosis not present

## 2018-12-11 DIAGNOSIS — M109 Gout, unspecified: Secondary | ICD-10-CM | POA: Diagnosis not present

## 2018-12-11 DIAGNOSIS — R768 Other specified abnormal immunological findings in serum: Secondary | ICD-10-CM | POA: Diagnosis not present

## 2018-12-11 DIAGNOSIS — M7989 Other specified soft tissue disorders: Secondary | ICD-10-CM | POA: Diagnosis not present

## 2018-12-11 DIAGNOSIS — M2550 Pain in unspecified joint: Secondary | ICD-10-CM | POA: Diagnosis not present

## 2018-12-11 DIAGNOSIS — E559 Vitamin D deficiency, unspecified: Secondary | ICD-10-CM | POA: Diagnosis not present

## 2018-12-12 DIAGNOSIS — M2550 Pain in unspecified joint: Secondary | ICD-10-CM | POA: Diagnosis not present

## 2018-12-19 DIAGNOSIS — M170 Bilateral primary osteoarthritis of knee: Secondary | ICD-10-CM | POA: Diagnosis not present

## 2019-01-09 ENCOUNTER — Inpatient Hospital Stay (HOSPITAL_COMMUNITY): Admission: RE | Admit: 2019-01-09 | Payer: Medicare Other | Source: Ambulatory Visit

## 2019-02-11 NOTE — Pre-Procedure Instructions (Signed)
[id number] - [place], [address] AT [address] [place] [address] Phone: [phone] Fax: [phone]      Your procedure is scheduled on Wednesday, May 6th.  Report to [facility] Main Entrance "A" Then check in at Admitting at 8:50 A.M.  Call this number if you have problems the morning of surgery:  [phone]  Call [phone] if you have any questions prior to your surgery date Monday-Friday 8am-4pm    Remember:  Do not eat or drink after midnight.    Take these medicines the morning of surgery with A SIP OF WATER  oxybutynin (DITROPAN) pantoprazole (PROTONIX) predniSONE (DELTASONE) SYNTHROID  AS needed: HYDROcodone-acetaminophen (NORCO/VICODIN)  Follow your surgeon's instructions on when to stop Aspirin.  If no instructions were given by your surgeon then you will need to call the office to get those instructions.    7 days prior to surgery STOP taking any Aspirin (unless otherwise instructed by your surgeon), Aleve, Naproxen, Ibuprofen, Motrin, Advil, Goody's, BC's, all herbal medications, fish oil, and all vitamins.    The Morning of Surgery  Do not wear jewelry, make-up or nail polish.  Do not wear lotions, powders, or perfumes/colognes, or deodorant  Do not shave 48 hours prior to surgery.  Men may shave face and neck.  Do not bring valuables to the hospital.  [facility] is not responsible for any belongings or valuables.  If you are a smoker, DO NOT Smoke 24 hours prior to surgery  REMEMBER THAT YOU MUST SOMEONE TO TRANSPORT YOU HOME AFTER SURGERY IF YOU ARE DISCHARGED THE SAME DAY OF SURGERY  YOU WILL ALSO NEED SOMEONE TO STAY WITH YOU FOR 24 HOURS AFTER SURGERY IF YOU ARE DISCHARGED THE SAME DAY OF SURGERY   Contacts, glasses, hearing aids, dentures or bridgework may not be worn into surgery.   If you wear a CPAP at night please bring your mask, tubing, and machine the  morning of surgery   Leave your suitcase in the car.  After surgery it may be brought to your room.  For patients admitted to the hospital, discharge time will be determined by your treatment team.  Patients discharged the day of surgery will not be allowed to drive home.    Special instructions:   [facility]- Preparing For Surgery  Before surgery, you can play an important role. Because skin is not sterile, your skin needs to be as free of germs as possible. You can reduce the number of germs on your skin by washing with CHG (chlorahexidine gluconate) Soap before surgery.  CHG is an antiseptic cleaner which kills germs and bonds with the skin to continue killing germs even after washing.    Oral Hygiene is also important to reduce your risk of infection.  Remember - BRUSH YOUR TEETH THE MORNING OF SURGERY WITH YOUR REGULAR TOOTHPASTE  Please do not use if you have an allergy to CHG or antibacterial soaps. If your skin becomes reddened/irritated stop using the CHG.  Do not shave (including legs and underarms) for at least 48 hours prior to first CHG shower. It is OK to shave your face.  Please follow these instructions carefully.   1. Shower the NIGHT BEFORE SURGERY and the MORNING OF SURGERY with CHG Soap.   2. If you chose to wash your hair, wash your hair first as usual with your normal shampoo.  3. After you shampoo, rinse your hair and  body thoroughly to remove the shampoo.  4. Use CHG as you would any other liquid soap. You can apply CHG directly to the skin and wash gently with a scrungie or a clean washcloth.   5. Apply the CHG Soap to your body ONLY FROM THE NECK DOWN.  Do not use on open wounds or open sores. Avoid contact with your eyes, ears, mouth and genitals (private parts). Wash Face and genitals (private parts)  with your normal soap.   6. Wash thoroughly, paying special attention to the area where your surgery will be performed.  7. Thoroughly rinse your body with  warm water from the neck down.  8. DO NOT shower/wash with your normal soap after using and rinsing off the CHG Soap.  9. Pat yourself dry with a CLEAN TOWEL.  10. Wear CLEAN PAJAMAS to bed the night before surgery, wear comfortable clothes the morning of surgery  11. Place CLEAN SHEETS on your bed the night of your first shower and DO NOT SLEEP WITH PETS.    Day of Surgery:  Do not apply any deodorants/lotions.  Please wear clean clothes to the hospital/surgery center.   Remember to brush your teeth WITH YOUR REGULAR TOOTHPASTE.   Please read over the following fact sheets that you were given.

## 2019-02-12 ENCOUNTER — Encounter (HOSPITAL_COMMUNITY)
Admission: RE | Admit: 2019-02-12 | Discharge: 2019-02-12 | Disposition: A | Discharge status: Home / Self Care | Payer: Medicare Other | Source: Ambulatory Visit | Attending: Neurosurgery | Admitting: Neurosurgery

## 2019-02-12 ENCOUNTER — Encounter (HOSPITAL_COMMUNITY): Payer: Self-pay

## 2019-02-12 ENCOUNTER — Other Ambulatory Visit: Payer: Self-pay

## 2019-02-12 DIAGNOSIS — Z01812 Encounter for preprocedural laboratory examination: Secondary | ICD-10-CM | POA: Insufficient documentation

## 2019-02-12 HISTORY — DX: Other amnesia: R41.3

## 2019-02-12 HISTORY — DX: Transient cerebral ischemic attack, unspecified: G45.9

## 2019-02-12 HISTORY — DX: Major depressive disorder, single episode, unspecified: F32.9

## 2019-02-12 HISTORY — DX: Personal history of other medical treatment: Z92.89

## 2019-02-12 HISTORY — DX: Depression, unspecified: F32.A

## 2019-02-12 HISTORY — DX: Pneumonia, unspecified organism: J18.9

## 2019-02-12 LAB — CBC
HCT: 42.2 % (ref 36.0–46.0)
Hemoglobin: 13.1 g/dL (ref 12.0–15.0)
MCH: 31.1 pg (ref 26.0–34.0)
MCHC: 31 g/dL (ref 30.0–36.0)
MCV: 100.2 fL — ABNORMAL HIGH (ref 80.0–100.0)
Platelets: 460 10*3/uL — ABNORMAL HIGH (ref 150–400)
RBC: 4.21 MIL/uL (ref 3.87–5.11)
RDW: 14.4 % (ref 11.5–15.5)
WBC: 12.6 10*3/uL — ABNORMAL HIGH (ref 4.0–10.5)
nRBC: 0 % (ref 0.0–0.2)

## 2019-02-12 LAB — BASIC METABOLIC PANEL
Anion gap: 14 (ref 5–15)
BUN: 39 mg/dL — ABNORMAL HIGH (ref 8–23)
CO2: 19 mmol/L — ABNORMAL LOW (ref 22–32)
Calcium: 9.1 mg/dL (ref 8.9–10.3)
Chloride: 105 mmol/L (ref 98–111)
Creatinine, Ser: 2.52 mg/dL — ABNORMAL HIGH (ref 0.44–1.00)
GFR calc Af Amer: 21 mL/min — ABNORMAL LOW (ref >60)
GFR calc non Af Amer: 18 mL/min — ABNORMAL LOW (ref >60)
Glucose, Bld: 85 mg/dL (ref 70–99)
Potassium: 4.8 mmol/L (ref 3.5–5.1)
Sodium: 138 mmol/L (ref 135–145)

## 2019-02-12 LAB — SURGICAL PCR SCREEN
MRSA, PCR: NEGATIVE
Staphylococcus aureus: NEGATIVE

## 2019-02-12 NOTE — Progress Notes (Signed)
PCP -  Dr Reynaldo Minium- I requested last office notes, labs  Cardiologist - none- said she saw Dr Einar Gip when she was in the hospital, no follow up - she thinks it was 5 + years ago  Chest x-ray - NA  EKG - 06/25/18  Stress Test - 2011  ECHO - 06/25/2018  Cardiac Cath - NA   Sleep Study - na  LABS- CBC, BMP- will need BMP day of surgery PCR, T/S  ASA-81- she stopped per information form Dr Arnoldo Morale office  [facility]- na  Anesthesia- Carolyn Robertson reports that she had a TIA 06/2018, she said the Dr. in the hospital said she did not.  Carolyn Carolyn Robertson reports that her memory has not been as good as it was in the past, since 06/2018  Pt denies having chest pain, sob, or fever at this time. All instructions explained to the pt, with a verbal understanding of the material. Pt agrees to go over the instructions while at home for a better understanding. The opportunity to ask questions was provided.  Patient denies that she or her family has experienced any of the following: Cough Fever >100.4 Runny Nose Sore Throat Difficulty breathing/ shortness of breath Travel in past 14 - none

## 2019-02-13 ENCOUNTER — Other Ambulatory Visit (HOSPITAL_COMMUNITY): Payer: Self-pay | Admitting: *Deleted

## 2019-02-13 NOTE — Anesthesia Preprocedure Evaluation (Addendum)
Anesthesia Evaluation  Patient identified by MRN, date of birth, ID band Patient awake    Reviewed: Allergy & Precautions, NPO status , Patient's Chart, lab work & pertinent test results  History of Anesthesia Complications Negative for: history of anesthetic complications  Airway Mallampati: II  TM Distance: >3 FB Neck ROM: Full    Dental no notable dental hx. (+) Dental Advisory Given   Pulmonary asthma ,    Pulmonary exam normal        Cardiovascular hypertension, Normal cardiovascular exam     Neuro/Psych PSYCHIATRIC DISORDERS Anxiety Depression TIA   GI/Hepatic Neg liver ROS, GERD  ,  Endo/Other  Hypothyroidism   Renal/GU Renal InsufficiencyRenal disease     Musculoskeletal negative musculoskeletal ROS (+)   Abdominal   Peds  Hematology negative hematology ROS (+)   Anesthesia Other Findings Day of surgery medications reviewed with the patient.  Reproductive/Obstetrics                                                             Anesthesia Evaluation  Patient identified by MRN, date of birth, ID band Patient awake    Reviewed: Allergy & Precautions, NPO status , Patient's Chart, lab work & pertinent test results  Airway Mallampati: II  TM Distance: >3 FB Neck ROM: Full    Dental  (+) Teeth Intact, Dental Advisory Given   Pulmonary asthma ,    breath sounds clear to auscultation       Cardiovascular hypertension,  Rhythm:Regular Rate:Normal     Neuro/Psych Anxiety negative neurological ROS     GI/Hepatic GERD  ,  Endo/Other  Hypothyroidism   Renal/GU CRFRenal disease  negative genitourinary   Musculoskeletal  (+) Arthritis ,   Abdominal Normal abdominal exam  (+)   Peds  Hematology negative hematology ROS (+)   Anesthesia Other Findings   Reproductive/Obstetrics                            Lab Results  Component Value  Date   WBC 12.6 (H) 02/12/2019   HGB 13.1 02/12/2019   HCT 42.2 02/12/2019   MCV 100.2 (H) 02/12/2019   PLT 460 (H) 02/12/2019   Lab Results  Component Value Date   CREATININE 2.52 (H) 02/12/2019   BUN 39 (H) 02/12/2019   NA 138 02/12/2019   K 4.8 02/12/2019   CL 105 02/12/2019   CO2 19 (L) 02/12/2019   Lab Results  Component Value Date   INR 1.06 06/24/2018    EKG: normal sinus rhythm, PAC's noted.  Anesthesia Physical Anesthesia Plan  ASA: III  Anesthesia Plan: General   Post-op Pain Management:    Induction: Intravenous  PONV Risk Score and Plan: 4 or greater and Ondansetron, Dexamethasone, Treatment may vary due to age or medical condition and Midazolam  Airway Management Planned: Oral ETT  Additional Equipment: None  Intra-op Plan:   Post-operative Plan: Extubation in OR  Informed Consent: I have reviewed the patients History and Physical, chart, labs and discussed the procedure including the risks, benefits and alternatives for the proposed anesthesia with the patient or authorized representative who has indicated his/her understanding and acceptance.   Dental advisory given  Plan Discussed with: CRNA  Anesthesia Plan Comments:  Anesthesia Quick Evaluation  Anesthesia Physical Anesthesia Plan  ASA: III  Anesthesia Plan: General   Post-op Pain Management:    Induction: Intravenous  PONV Risk Score and Plan: 4 or greater and Ondansetron, Dexamethasone, Diphenhydramine and Treatment may vary due to age or medical condition  Airway Management Planned: Oral ETT  Additional Equipment:   Intra-op Plan:   Post-operative Plan: Extubation in OR  Informed Consent: I have reviewed the patients History and Physical, chart, labs and discussed the procedure including the risks, benefits and alternatives for the proposed anesthesia with the patient or authorized representative who has indicated his/her understanding and acceptance.      Dental advisory given  Plan Discussed with: CRNA and Anesthesiologist  Anesthesia Plan Comments: (Admission 9/8-9/10/19 for complaints of difficulty finding words. MRI of the brain did not show any acute stroke.  Neurology consulted, underwent work-up for TIA . Found to have urinary tract infection. Per neuro consult note, Encephalopathy likely due to UTI. Less likely TIA. Carotid US showed minimal plaque. Echo showed EF 65-70%. There was dynamic obstruction at rest, with a peak velocity of 139 cm/sec and a peak gradient of 8 mm Hg. Wall motion was normal. No significant valvular abnormalities. Grade 1dd.  )     Anesthesia Quick Evaluation

## 2019-02-13 NOTE — Progress Notes (Signed)
Blood Bank called to notify that the patient has antibodies.  The patient will need to have a re-draw type and screen the day of surgery

## 2019-02-18 DIAGNOSIS — E538 Deficiency of other specified B group vitamins: Secondary | ICD-10-CM | POA: Diagnosis not present

## 2019-02-18 DIAGNOSIS — E038 Other specified hypothyroidism: Secondary | ICD-10-CM | POA: Diagnosis not present

## 2019-02-18 DIAGNOSIS — I10 Essential (primary) hypertension: Secondary | ICD-10-CM | POA: Diagnosis not present

## 2019-02-19 ENCOUNTER — Other Ambulatory Visit: Payer: Self-pay | Admitting: Neurosurgery

## 2019-02-20 ENCOUNTER — Inpatient Hospital Stay (HOSPITAL_COMMUNITY)
Admission: RE | Disposition: A | Discharge status: Home Health | Payer: Self-pay | Source: Home / Self Care | Attending: Neurosurgery

## 2019-02-20 ENCOUNTER — Other Ambulatory Visit: Payer: Self-pay

## 2019-02-20 ENCOUNTER — Encounter (HOSPITAL_COMMUNITY): Payer: Self-pay | Admitting: *Deleted

## 2019-02-20 ENCOUNTER — Inpatient Hospital Stay (HOSPITAL_COMMUNITY): Payer: Medicare Other | Admitting: Vascular Surgery

## 2019-02-20 ENCOUNTER — Inpatient Hospital Stay (HOSPITAL_COMMUNITY)
Admission: RE | Admit: 2019-02-20 | Discharge: 2019-02-21 | DRG: 455 | Disposition: A | Discharge status: Home Health | Payer: Medicare Other | Attending: Neurosurgery | Admitting: Neurosurgery

## 2019-02-20 ENCOUNTER — Inpatient Hospital Stay (HOSPITAL_COMMUNITY): Payer: Medicare Other

## 2019-02-20 ENCOUNTER — Inpatient Hospital Stay (HOSPITAL_COMMUNITY): Payer: Medicare Other | Admitting: Anesthesiology

## 2019-02-20 DIAGNOSIS — M48062 Spinal stenosis, lumbar region with neurogenic claudication: Secondary | ICD-10-CM | POA: Diagnosis not present

## 2019-02-20 DIAGNOSIS — E039 Hypothyroidism, unspecified: Secondary | ICD-10-CM | POA: Diagnosis present

## 2019-02-20 DIAGNOSIS — M545 Low back pain: Secondary | ICD-10-CM | POA: Diagnosis not present

## 2019-02-20 DIAGNOSIS — M5116 Intervertebral disc disorders with radiculopathy, lumbar region: Secondary | ICD-10-CM | POA: Diagnosis not present

## 2019-02-20 DIAGNOSIS — Z79890 Hormone replacement therapy (postmenopausal): Secondary | ICD-10-CM

## 2019-02-20 DIAGNOSIS — K219 Gastro-esophageal reflux disease without esophagitis: Secondary | ICD-10-CM | POA: Diagnosis not present

## 2019-02-20 DIAGNOSIS — Z7982 Long term (current) use of aspirin: Secondary | ICD-10-CM | POA: Diagnosis not present

## 2019-02-20 DIAGNOSIS — Z79899 Other long term (current) drug therapy: Secondary | ICD-10-CM | POA: Diagnosis not present

## 2019-02-20 DIAGNOSIS — Z8673 Personal history of transient ischemic attack (TIA), and cerebral infarction without residual deficits: Secondary | ICD-10-CM | POA: Diagnosis not present

## 2019-02-20 DIAGNOSIS — I10 Essential (primary) hypertension: Secondary | ICD-10-CM | POA: Diagnosis present

## 2019-02-20 DIAGNOSIS — Z419 Encounter for procedure for purposes other than remedying health state, unspecified: Secondary | ICD-10-CM

## 2019-02-20 DIAGNOSIS — Z981 Arthrodesis status: Secondary | ICD-10-CM | POA: Diagnosis not present

## 2019-02-20 DIAGNOSIS — M4316 Spondylolisthesis, lumbar region: Secondary | ICD-10-CM | POA: Diagnosis present

## 2019-02-20 DIAGNOSIS — Z7952 Long term (current) use of systemic steroids: Secondary | ICD-10-CM | POA: Diagnosis not present

## 2019-02-20 DIAGNOSIS — J45909 Unspecified asthma, uncomplicated: Secondary | ICD-10-CM | POA: Diagnosis present

## 2019-02-20 LAB — POCT I-STAT 4, (NA,K, GLUC, HGB,HCT)
Glucose, Bld: 102 mg/dL — ABNORMAL HIGH (ref 70–99)
HCT: 30 % — ABNORMAL LOW (ref 36.0–46.0)
Hemoglobin: 10.2 g/dL — ABNORMAL LOW (ref 12.0–15.0)
Potassium: 4.4 mmol/L (ref 3.5–5.1)
Sodium: 138 mmol/L (ref 135–145)

## 2019-02-20 LAB — TYPE AND SCREEN
ABO/RH(D): O POS
Antibody Screen: POSITIVE

## 2019-02-20 SURGERY — POSTERIOR LUMBAR FUSION 1 LEVEL
Anesthesia: General | Site: Spine Lumbar

## 2019-02-20 MED ORDER — PROPOFOL 10 MG/ML IV BOLUS
INTRAVENOUS | Status: AC
  Filled 2019-02-20: qty 20

## 2019-02-20 MED ORDER — VANCOMYCIN HCL IN DEXTROSE 1-5 GM/200ML-% IV SOLN
1000.0000 mg | INTRAVENOUS | Status: AC
  Administered 2019-02-20: 1000 mg via INTRAVENOUS
  Filled 2019-02-20: qty 200

## 2019-02-20 MED ORDER — OXYBUTYNIN CHLORIDE 5 MG PO TABS
5.0000 mg | ORAL_TABLET | Freq: Every day | ORAL | Status: DC
  Administered 2019-02-20 – 2019-02-21 (×2): 5 mg via ORAL
  Filled 2019-02-20 (×2): qty 1

## 2019-02-20 MED ORDER — KETAMINE INJECTION FOR OPTIME (MG/KG/HR) DOCUMENTATION
INTRAVENOUS | Status: DC | PRN
  Administered 2019-02-20: 10 mg via INTRAVENOUS
  Administered 2019-02-20: 20 mg via INTRAVENOUS
  Administered 2019-02-20: 10 mg via INTRAVENOUS

## 2019-02-20 MED ORDER — ONDANSETRON HCL 4 MG/2ML IJ SOLN
INTRAMUSCULAR | Status: AC
  Filled 2019-02-20: qty 2

## 2019-02-20 MED ORDER — BACITRACIN ZINC 500 UNIT/GM EX OINT
TOPICAL_OINTMENT | CUTANEOUS | Status: AC
  Filled 2019-02-20: qty 28.35

## 2019-02-20 MED ORDER — CHLORHEXIDINE GLUCONATE CLOTH 2 % EX PADS: 6.0000 | MEDICATED_PAD | Freq: Once | CUTANEOUS | Status: DC

## 2019-02-20 MED ORDER — CYCLOBENZAPRINE HCL 10 MG PO TABS
10.0000 mg | ORAL_TABLET | Freq: Three times a day (TID) | ORAL | Status: DC | PRN
  Administered 2019-02-20: 10 mg via ORAL

## 2019-02-20 MED ORDER — LEVOTHYROXINE SODIUM 150 MCG PO TABS
150.0000 ug | ORAL_TABLET | Freq: Every day | ORAL | Status: DC
  Administered 2019-02-21: 150 ug via ORAL
  Filled 2019-02-20: qty 2
  Filled 2019-02-20: qty 1

## 2019-02-20 MED ORDER — ONDANSETRON HCL 4 MG/2ML IJ SOLN
INTRAMUSCULAR | Status: DC | PRN
  Administered 2019-02-20: 4 mg via INTRAVENOUS

## 2019-02-20 MED ORDER — ROCURONIUM BROMIDE 50 MG/5ML IV SOSY
PREFILLED_SYRINGE | INTRAVENOUS | Status: DC | PRN
  Administered 2019-02-20: 10 mg via INTRAVENOUS
  Administered 2019-02-20: 20 mg via INTRAVENOUS

## 2019-02-20 MED ORDER — VANCOMYCIN HCL 1000 MG IV SOLR
INTRAVENOUS | Status: AC
  Filled 2019-02-20: qty 1000

## 2019-02-20 MED ORDER — FENTANYL CITRATE (PF) 100 MCG/2ML IJ SOLN: 25.0000 ug | INTRAMUSCULAR | Status: DC | PRN

## 2019-02-20 MED ORDER — FENTANYL CITRATE (PF) 250 MCG/5ML IJ SOLN
INTRAMUSCULAR | Status: AC
  Filled 2019-02-20: qty 5

## 2019-02-20 MED ORDER — LABETALOL HCL 5 MG/ML IV SOLN
INTRAVENOUS | Status: AC
  Filled 2019-02-20: qty 4

## 2019-02-20 MED ORDER — ACETAMINOPHEN 500 MG PO TABS
1000.0000 mg | ORAL_TABLET | Freq: Once | ORAL | Status: AC
  Administered 2019-02-20: 1000 mg via ORAL
  Filled 2019-02-20: qty 2

## 2019-02-20 MED ORDER — HYDROCODONE-ACETAMINOPHEN 10-325 MG PO TABS
1.0000 | ORAL_TABLET | ORAL | Status: DC | PRN
  Administered 2019-02-20 – 2019-02-21 (×3): 1 via ORAL
  Filled 2019-02-20 (×2): qty 1

## 2019-02-20 MED ORDER — PREDNISONE 10 MG PO TABS
10.0000 mg | ORAL_TABLET | Freq: Every day | ORAL | Status: DC
  Administered 2019-02-21: 10 mg via ORAL
  Filled 2019-02-20: qty 1
  Filled 2019-02-20: qty 2

## 2019-02-20 MED ORDER — THROMBIN 5000 UNITS EX SOLR
OROMUCOSAL | Status: DC | PRN
  Administered 2019-02-20: 5 mL via TOPICAL
  Administered 2019-02-20: 13:00:00 via TOPICAL

## 2019-02-20 MED ORDER — MEDROXYPROGESTERONE ACETATE 2.5 MG PO TABS
2.5000 mg | ORAL_TABLET | Freq: Every day | ORAL | Status: DC
  Administered 2019-02-21: 2.5 mg via ORAL
  Filled 2019-02-20 (×2): qty 1

## 2019-02-20 MED ORDER — 0.9 % SODIUM CHLORIDE (POUR BTL) OPTIME
TOPICAL | Status: DC | PRN
  Administered 2019-02-20: 1000 mL

## 2019-02-20 MED ORDER — NEOSTIGMINE METHYLSULFATE 3 MG/3ML IV SOSY
PREFILLED_SYRINGE | INTRAVENOUS | Status: AC
  Filled 2019-02-20: qty 3

## 2019-02-20 MED ORDER — PROPOFOL 10 MG/ML IV BOLUS
INTRAVENOUS | Status: DC | PRN
  Administered 2019-02-20: 150 mg via INTRAVENOUS

## 2019-02-20 MED ORDER — SODIUM CHLORIDE 0.9 % IV SOLN
INTRAVENOUS | Status: DC | PRN
  Administered 2019-02-20: 500 mL

## 2019-02-20 MED ORDER — GLYCOPYRROLATE 0.2 MG/ML IJ SOLN
INTRAMUSCULAR | Status: DC | PRN
  Administered 2019-02-20: 0.4 mg via INTRAVENOUS

## 2019-02-20 MED ORDER — HYDROCODONE-ACETAMINOPHEN 10-325 MG PO TABS
ORAL_TABLET | ORAL | Status: AC
  Filled 2019-02-20: qty 1

## 2019-02-20 MED ORDER — SODIUM CHLORIDE 0.9 % IV SOLN
INTRAVENOUS | Status: DC | PRN
  Administered 2019-02-20: 25 ug/min via INTRAVENOUS

## 2019-02-20 MED ORDER — BUPIVACAINE LIPOSOME 1.3 % IJ SUSP
20.0000 mL | INTRAMUSCULAR | Status: AC
  Administered 2019-02-20: 20 mL
  Filled 2019-02-20: qty 20

## 2019-02-20 MED ORDER — LIDOCAINE 2% (20 MG/ML) 5 ML SYRINGE
INTRAMUSCULAR | Status: AC
  Filled 2019-02-20: qty 5

## 2019-02-20 MED ORDER — ZOLPIDEM TARTRATE 5 MG PO TABS: 5.0000 mg | ORAL_TABLET | Freq: Every evening | ORAL | Status: DC | PRN

## 2019-02-20 MED ORDER — MIDAZOLAM HCL 5 MG/5ML IJ SOLN
INTRAMUSCULAR | Status: DC | PRN
  Administered 2019-02-20: 1 mg via INTRAVENOUS

## 2019-02-20 MED ORDER — VANCOMYCIN HCL 1 G IV SOLR
INTRAVENOUS | Status: DC | PRN
  Administered 2019-02-20: 1000 mg

## 2019-02-20 MED ORDER — SUCCINYLCHOLINE CHLORIDE 20 MG/ML IJ SOLN
INTRAMUSCULAR | Status: DC | PRN
  Administered 2019-02-20: 100 mg via INTRAVENOUS

## 2019-02-20 MED ORDER — KETAMINE HCL 50 MG/5ML IJ SOSY
PREFILLED_SYRINGE | INTRAMUSCULAR | Status: AC
  Filled 2019-02-20: qty 5

## 2019-02-20 MED ORDER — ESTROGENS CONJUGATED 0.625 MG PO TABS
0.6250 mg | ORAL_TABLET | Freq: Every day | ORAL | Status: DC
  Administered 2019-02-20: 0.625 mg via ORAL
  Filled 2019-02-20 (×2): qty 1

## 2019-02-20 MED ORDER — CYCLOBENZAPRINE HCL 10 MG PO TABS
ORAL_TABLET | ORAL | Status: AC
  Filled 2019-02-20: qty 1

## 2019-02-20 MED ORDER — BISACODYL 10 MG RE SUPP: 10.0000 mg | Freq: Every day | RECTAL | Status: DC | PRN

## 2019-02-20 MED ORDER — DOCUSATE SODIUM 100 MG PO CAPS
100.0000 mg | ORAL_CAPSULE | Freq: Two times a day (BID) | ORAL | Status: DC
  Administered 2019-02-20 – 2019-02-21 (×2): 100 mg via ORAL
  Filled 2019-02-20 (×2): qty 1

## 2019-02-20 MED ORDER — THROMBIN 5000 UNITS EX SOLR
CUTANEOUS | Status: AC
  Filled 2019-02-20: qty 5000

## 2019-02-20 MED ORDER — FENTANYL CITRATE (PF) 100 MCG/2ML IJ SOLN
INTRAMUSCULAR | Status: DC | PRN
  Administered 2019-02-20 (×2): 50 ug via INTRAVENOUS
  Administered 2019-02-20 (×2): 100 ug via INTRAVENOUS
  Administered 2019-02-20: 50 ug via INTRAVENOUS

## 2019-02-20 MED ORDER — DULOXETINE HCL 60 MG PO CPEP
60.0000 mg | ORAL_CAPSULE | Freq: Every day | ORAL | Status: DC
  Administered 2019-02-20: 60 mg via ORAL
  Filled 2019-02-20: qty 1

## 2019-02-20 MED ORDER — MIDAZOLAM HCL 2 MG/2ML IJ SOLN
INTRAMUSCULAR | Status: AC
  Filled 2019-02-20: qty 2

## 2019-02-20 MED ORDER — ACETAMINOPHEN 325 MG PO TABS: 650.0000 mg | ORAL_TABLET | ORAL | Status: DC | PRN

## 2019-02-20 MED ORDER — HYDROMORPHONE HCL 1 MG/ML IJ SOLN
0.5000 mg | INTRAMUSCULAR | Status: DC | PRN
  Filled 2019-02-20: qty 0.5

## 2019-02-20 MED ORDER — BACITRACIN ZINC 500 UNIT/GM EX OINT
TOPICAL_OINTMENT | CUTANEOUS | Status: DC | PRN
  Administered 2019-02-20: 1 via TOPICAL

## 2019-02-20 MED ORDER — IRBESARTAN 150 MG PO TABS
150.0000 mg | ORAL_TABLET | Freq: Every day | ORAL | Status: DC
  Administered 2019-02-20 – 2019-02-21 (×2): 150 mg via ORAL
  Filled 2019-02-20 (×2): qty 1

## 2019-02-20 MED ORDER — SODIUM CHLORIDE 0.9% FLUSH: 3.0000 mL | INTRAVENOUS | Status: DC | PRN

## 2019-02-20 MED ORDER — SODIUM CHLORIDE 0.9 % IV SOLN
INTRAVENOUS | Status: DC
  Administered 2019-02-20: 10 mL via INTRAVENOUS

## 2019-02-20 MED ORDER — ONDANSETRON HCL 4 MG/2ML IJ SOLN: 4.0000 mg | Freq: Four times a day (QID) | INTRAMUSCULAR | Status: DC | PRN

## 2019-02-20 MED ORDER — SUCCINYLCHOLINE CHLORIDE 200 MG/10ML IV SOSY
PREFILLED_SYRINGE | INTRAVENOUS | Status: AC
  Filled 2019-02-20: qty 10

## 2019-02-20 MED ORDER — ACETAMINOPHEN 500 MG PO TABS
1000.0000 mg | ORAL_TABLET | Freq: Four times a day (QID) | ORAL | Status: DC
  Administered 2019-02-20 – 2019-02-21 (×3): 1000 mg via ORAL
  Filled 2019-02-20 (×3): qty 2

## 2019-02-20 MED ORDER — PROMETHAZINE HCL 25 MG/ML IJ SOLN: 6.2500 mg | INTRAMUSCULAR | Status: DC | PRN

## 2019-02-20 MED ORDER — SODIUM CHLORIDE 0.9% FLUSH
3.0000 mL | Freq: Two times a day (BID) | INTRAVENOUS | Status: DC
  Administered 2019-02-20 – 2019-02-21 (×2): 3 mL via INTRAVENOUS

## 2019-02-20 MED ORDER — HYDROCORTISONE NA SUCCINATE PF 100 MG IJ SOLR
50.0000 mg | Freq: Four times a day (QID) | INTRAMUSCULAR | Status: DC
  Administered 2019-02-20 – 2019-02-21 (×3): 50 mg via INTRAVENOUS
  Filled 2019-02-20 (×3): qty 2

## 2019-02-20 MED ORDER — BUPIVACAINE-EPINEPHRINE (PF) 0.5% -1:200000 IJ SOLN
INTRAMUSCULAR | Status: AC
  Filled 2019-02-20: qty 30

## 2019-02-20 MED ORDER — ROCURONIUM BROMIDE 10 MG/ML (PF) SYRINGE
PREFILLED_SYRINGE | INTRAVENOUS | Status: AC
  Filled 2019-02-20: qty 10

## 2019-02-20 MED ORDER — PHENOL 1.4 % MT LIQD: 1.0000 | OROMUCOSAL | Status: DC | PRN

## 2019-02-20 MED ORDER — ALBUMIN HUMAN 5 % IV SOLN
INTRAVENOUS | Status: DC | PRN
  Administered 2019-02-20: 13:00:00 via INTRAVENOUS

## 2019-02-20 MED ORDER — PHENYLEPHRINE 40 MCG/ML (10ML) SYRINGE FOR IV PUSH (FOR BLOOD PRESSURE SUPPORT)
PREFILLED_SYRINGE | INTRAVENOUS | Status: AC
  Filled 2019-02-20: qty 10

## 2019-02-20 MED ORDER — NEOSTIGMINE METHYLSULFATE 10 MG/10ML IV SOLN
INTRAVENOUS | Status: DC | PRN
  Administered 2019-02-20: 2 mg via INTRAVENOUS

## 2019-02-20 MED ORDER — FUROSEMIDE 40 MG PO TABS: 40.0000 mg | ORAL_TABLET | Freq: Every day | ORAL | Status: DC | PRN

## 2019-02-20 MED ORDER — ACETAMINOPHEN 650 MG RE SUPP: 650.0000 mg | RECTAL | Status: DC | PRN

## 2019-02-20 MED ORDER — LIDOCAINE 2% (20 MG/ML) 5 ML SYRINGE
INTRAMUSCULAR | Status: DC | PRN
  Administered 2019-02-20: 30 mg via INTRAVENOUS

## 2019-02-20 MED ORDER — CONJ ESTROG-MEDROXYPROGEST ACE 0.625-2.5 MG PO TABS: 1.0000 | ORAL_TABLET | Freq: Every day | ORAL | Status: DC

## 2019-02-20 MED ORDER — ONDANSETRON HCL 4 MG PO TABS: 4.0000 mg | ORAL_TABLET | Freq: Four times a day (QID) | ORAL | Status: DC | PRN

## 2019-02-20 MED ORDER — MENTHOL 3 MG MT LOZG: 1.0000 | LOZENGE | OROMUCOSAL | Status: DC | PRN

## 2019-02-20 MED ORDER — LACTATED RINGERS IV SOLN
INTRAVENOUS | Status: DC | PRN
  Administered 2019-02-20 (×2): via INTRAVENOUS

## 2019-02-20 MED ORDER — GLYCOPYRROLATE PF 0.2 MG/ML IJ SOSY
PREFILLED_SYRINGE | INTRAMUSCULAR | Status: AC
  Filled 2019-02-20: qty 1

## 2019-02-20 MED ORDER — BUPIVACAINE-EPINEPHRINE (PF) 0.5% -1:200000 IJ SOLN
INTRAMUSCULAR | Status: DC | PRN
  Administered 2019-02-20: 10 mL

## 2019-02-20 MED ORDER — SODIUM CHLORIDE 0.9 % IV SOLN: 250.0000 mL | INTRAVENOUS | Status: DC

## 2019-02-20 MED ORDER — FEBUXOSTAT 40 MG PO TABS
40.0000 mg | ORAL_TABLET | Freq: Every day | ORAL | Status: DC
  Administered 2019-02-20: 40 mg via ORAL
  Filled 2019-02-20 (×3): qty 1

## 2019-02-20 MED ORDER — PANTOPRAZOLE SODIUM 40 MG PO TBEC
40.0000 mg | DELAYED_RELEASE_TABLET | Freq: Every day | ORAL | Status: DC
  Administered 2019-02-21: 40 mg via ORAL
  Filled 2019-02-20: qty 1

## 2019-02-20 SURGICAL SUPPLY — 65 items
BAG DECANTER FOR FLEXI CONT (MISCELLANEOUS) ×2 IMPLANT
BENZOIN TINCTURE PRP APPL 2/3 (GAUZE/BANDAGES/DRESSINGS) ×2 IMPLANT
BLADE CLIPPER SURG (BLADE) IMPLANT
BUR MATCHSTICK NEURO 3.0 LAGG (BURR) ×2 IMPLANT
BUR PRECISION FLUTE 6.0 (BURR) ×2 IMPLANT
CAGE ALTERA 10X31X9-13 15D (Cage) ×2 IMPLANT
CANISTER SUCT 3000ML PPV (MISCELLANEOUS) ×2 IMPLANT
CAP REVERE LOCKING (Cap) ×8 IMPLANT
CARTRIDGE OIL MAESTRO DRILL (MISCELLANEOUS) ×1 IMPLANT
CONT SPEC 4OZ CLIKSEAL STRL BL (MISCELLANEOUS) ×2 IMPLANT
COVER BACK TABLE 60X90IN (DRAPES) ×2 IMPLANT
COVER WAND RF STERILE (DRAPES) IMPLANT
DECANTER SPIKE VIAL GLASS SM (MISCELLANEOUS) ×2 IMPLANT
DIFFUSER DRILL AIR PNEUMATIC (MISCELLANEOUS) ×2 IMPLANT
DRAPE C-ARM 42X72 X-RAY (DRAPES) ×6 IMPLANT
DRAPE HALF SHEET 40X57 (DRAPES) ×6 IMPLANT
DRAPE LAPAROTOMY 100X72X124 (DRAPES) ×2 IMPLANT
DRAPE SURG 17X23 STRL (DRAPES) ×8 IMPLANT
DRSG OPSITE POSTOP 4X6 (GAUZE/BANDAGES/DRESSINGS) ×2 IMPLANT
ELECT BLADE 4.0 EZ CLEAN MEGAD (MISCELLANEOUS) ×2
ELECT REM PT RETURN 9FT ADLT (ELECTROSURGICAL) ×2
ELECTRODE BLDE 4.0 EZ CLN MEGD (MISCELLANEOUS) ×1 IMPLANT
ELECTRODE REM PT RTRN 9FT ADLT (ELECTROSURGICAL) ×1 IMPLANT
EVACUATOR 1/8 PVC DRAIN (DRAIN) IMPLANT
GAUZE 4X4 16PLY RFD (DISPOSABLE) IMPLANT
GAUZE SPONGE 4X4 12PLY STRL (GAUZE/BANDAGES/DRESSINGS) IMPLANT
GLOVE BIO SURGEON STRL SZ 6.5 (GLOVE) ×12 IMPLANT
GLOVE BIO SURGEON STRL SZ7 (GLOVE) ×4 IMPLANT
GLOVE BIO SURGEON STRL SZ8 (GLOVE) ×4 IMPLANT
GLOVE BIO SURGEON STRL SZ8.5 (GLOVE) ×4 IMPLANT
GLOVE BIOGEL PI IND STRL 6.5 (GLOVE) ×3 IMPLANT
GLOVE BIOGEL PI INDICATOR 6.5 (GLOVE) ×3
GLOVE EXAM NITRILE XL STR (GLOVE) IMPLANT
GOWN STRL REUS W/ TWL LRG LVL3 (GOWN DISPOSABLE) ×5 IMPLANT
GOWN STRL REUS W/ TWL XL LVL3 (GOWN DISPOSABLE) ×2 IMPLANT
GOWN STRL REUS W/TWL 2XL LVL3 (GOWN DISPOSABLE) IMPLANT
GOWN STRL REUS W/TWL LRG LVL3 (GOWN DISPOSABLE) ×5
GOWN STRL REUS W/TWL XL LVL3 (GOWN DISPOSABLE) ×2
HEMOSTAT POWDER KIT SURGIFOAM (HEMOSTASIS) ×4 IMPLANT
KIT BASIN OR (CUSTOM PROCEDURE TRAY) ×2 IMPLANT
KIT TURNOVER KIT B (KITS) ×2 IMPLANT
MILL MEDIUM DISP (BLADE) IMPLANT
NEEDLE HYPO 21X1.5 SAFETY (NEEDLE) ×2 IMPLANT
NEEDLE HYPO 22GX1.5 SAFETY (NEEDLE) ×2 IMPLANT
NS IRRIG 1000ML POUR BTL (IV SOLUTION) ×2 IMPLANT
OIL CARTRIDGE MAESTRO DRILL (MISCELLANEOUS) ×2
PACK LAMINECTOMY NEURO (CUSTOM PROCEDURE TRAY) ×2 IMPLANT
PAD ARMBOARD 7.5X6 YLW CONV (MISCELLANEOUS) ×12 IMPLANT
PATTIES SURGICAL .5 X1 (DISPOSABLE) IMPLANT
PUTTY DBM 10CC CALC GRAN (Putty) ×2 IMPLANT
ROD REVERE 6.35 40MM (Rod) ×4 IMPLANT
SCREW REVERE 6.35 6.5MMX45 (Screw) ×4 IMPLANT
SCREW REVERE 6.5X50MM (Screw) ×4 IMPLANT
SPONGE LAP 4X18 RFD (DISPOSABLE) IMPLANT
SPONGE NEURO XRAY DETECT 1X3 (DISPOSABLE) IMPLANT
SPONGE SURGIFOAM ABS GEL 100 (HEMOSTASIS) IMPLANT
STRIP CLOSURE SKIN 1/2X4 (GAUZE/BANDAGES/DRESSINGS) ×2 IMPLANT
SUT VIC AB 1 CT1 18XBRD ANBCTR (SUTURE) ×1 IMPLANT
SUT VIC AB 1 CT1 8-18 (SUTURE) ×1
SUT VIC AB 2-0 CP2 18 (SUTURE) ×2 IMPLANT
SYR 20CC LL (SYRINGE) ×2 IMPLANT
TOWEL GREEN STERILE (TOWEL DISPOSABLE) ×2 IMPLANT
TOWEL GREEN STERILE FF (TOWEL DISPOSABLE) ×2 IMPLANT
TRAY FOLEY MTR SLVR 16FR STAT (SET/KITS/TRAYS/PACK) ×2 IMPLANT
WATER STERILE IRR 1000ML POUR (IV SOLUTION) ×2 IMPLANT

## 2019-02-20 NOTE — Anesthesia Postprocedure Evaluation (Signed)
Anesthesia Post Note  Patient: Carolyn Robertson  Procedure(s) Performed: Lumbar Four-Five Posterior lumbar interbody fusion/interbody prosthesis/posterior instrumentation (N/A Spine Lumbar)     Patient location during evaluation: PACU Anesthesia Type: General Level of consciousness: sedated Pain management: pain level controlled Vital Signs Assessment: post-procedure vital signs reviewed and stable Respiratory status: spontaneous breathing and respiratory function stable Cardiovascular status: stable Postop Assessment: no apparent nausea or vomiting Anesthetic complications: no    Last Vitals:  Vitals:   02/20/19 1408 02/20/19 1500  BP: (!) 161/80 (!) 159/77  Pulse: 72 70  Resp: 20 12  Temp:  (!) 36.4 C  SpO2: 95% 95%    Last Pain:  Vitals:   02/20/19 1500  TempSrc: Oral  PainSc: [place]

## 2019-02-20 NOTE — Progress Notes (Signed)
Subjective: The patient is somnolent but easily arousable.  She is in no apparent distress.  She looks well.  Objective: Vital signs in last 24 hours: Temp:  [98.1 F (36.7 C)] 98.1 F (36.7 C) (05/06 0739) Pulse Rate:  [86] 86 (05/06 0739) Resp:  [18] 18 (05/06 0739) BP: (164-176)/(70-77) 164/77 (05/06 0814) SpO2:  [100 %] 100 % (05/06 0739) Weight:  [69.9 kg] 69.9 kg (05/06 0739) Estimated body mass index is 27.3 kg/m as calculated from the following:   Height as of this encounter: 5\' 3"  (1.6 m).   Weight as of this encounter: 69.9 kg.   Intake/Output from previous day: No intake/output data recorded. Intake/Output this shift: Total I/O In: 1250 [I.V.:1000; IV Piggyback:250] Out: 440 [Urine:240; Blood:200]  Physical exam the patient is somnolent but arousable.  She is moving all 4 extremities well.  Lab Results: No results for input(s): WBC, HGB, HCT, PLT in the last 72 hours. BMET No results for input(s): NA, K, CL, CO2, GLUCOSE, BUN, CREATININE, CALCIUM in the last 72 hours.  Studies/Results: Dg Lumbar Spine 1 View  Result Date: 02/20/2019 CLINICAL DATA:  L4-5 PLIF EXAM: LUMBAR SPINE - 1 VIEW COMPARISON:  11/12/2018 FINDINGS: L4-5 advanced disc degeneration with anterolisthesis. Spinal numbering is based on preoperative MRI. A probe projects at the L4-5 interspinous space. IMPRESSION: Intraoperative localization at L4-5. Electronically Signed   By: Monte Fantasia M.D.   On: 02/20/2019 11:20    Assessment/Plan: The patient is doing well.  I called both contact numbers and was not able to speak with anybody.  LOS: 0 days     Ophelia Charter 02/20/2019, 1:27 PM

## 2019-02-20 NOTE — Progress Notes (Signed)
Orthopedic Tech Progress Note Patient Details:  Carolyn Robertson Jan 28, 1946 [id number] RN said patient has brace Patient ID: Carolyn Robertson, female   DOB: December 23, 1945, 73 y.o.   MRN: [id number]   Carolyn Robertson 02/20/2019, 3:24 PM

## 2019-02-20 NOTE — Transfer of Care (Signed)
Immediate Anesthesia Transfer of Care Note  Patient: Tiffanny Lamarche [place]  Procedure(s) Performed: Lumbar Four-Five Posterior lumbar interbody fusion/interbody prosthesis/posterior instrumentation (N/A Spine Lumbar)  Patient Location: PACU  Anesthesia Type:General  Level of Consciousness: awake and alert   Airway & Oxygen Therapy: Patient Spontanous Breathing and Patient connected to face mask oxygen  Post-op Assessment: Report given to RN and Post -op Vital signs reviewed and stable  Post vital signs: Reviewed and stable  Last Vitals:  Vitals Value Taken Time  BP 145/96 02/20/2019  1:26 PM  Temp 36.3 C 02/20/2019  1:26 PM  Pulse 76 02/20/2019  1:35 PM  Resp 20 02/20/2019  1:35 PM  SpO2 98 % 02/20/2019  1:35 PM  Vitals shown include unvalidated device data.  Last Pain:  Vitals:   02/20/19 0814  TempSrc:   PainSc: 5       Patients Stated Pain Goal: 2 (61/[id number]/09 [id number])  Complications: No apparent anesthesia complications

## 2019-02-20 NOTE — Op Note (Signed)
Brief history: The patient is a 73 year old white female who has complained of back and right greater left leg pain consistent with neurogenic claudication.  She has had a prior L4-5 laminectomy discectomy.  She failed medical management and was worked up with a lumbar MRI which demonstrated an L4-5 spondylolisthesis with severe foraminal stenosis.  I discussed the various treatment options with her.  She has decided to proceed with surgery after weighing the risks, benefits and alternatives.  Preoperative diagnosis: L4-5 spondylolisthesis, foraminal stenosis, degenerative disc disease, spinal stenosis compressing both the L4 and the L5 nerve roots; lumbago; lumbar radiculopathy; neurogenic claudication  Postoperative diagnosis: The same  Procedure: Bilateral redo L4-5 laminotomy/foraminotomies/medial facetectomy to decompress the bilateral l4 and L5 nerve roots(the work required to do this was in addition to the work required to do the posterior lumbar interbody fusion because of the patient's spinal stenosis, facet arthropathy. Etc. requiring a wide decompression of the nerve roots.);  L4-5 transforaminal lumbar interbody fusion with local morselized autograft bone and Zimmer DBM; insertion of interbody prosthesis at L4-5 (globus peek expandable interbody prosthesis); posterior nonsegmental instrumentation from L4 to L5 with globus titanium pedicle screws and rods; posterior lateral arthrodesis at L4-5 with local morselized autograft bone and Zimmer DBM.  Surgeon: Dr. Earle Gell  Asst.: Arnetha Massy nurse practitioner  Anesthesia: Gen. endotracheal  Estimated blood loss: 200 cc  Drains: None  Complications: None  Description of procedure: The patient was brought to the operating room by the anesthesia team. General endotracheal anesthesia was induced. The patient was turned to the prone position on the Wilson frame. The patient's lumbosacral region was then prepared with Betadine scrub and  Betadine solution. Sterile drapes were applied.  I then injected the area to be incised with Marcaine with epinephrine solution. I then used the scalpel to make a linear midline incision over the L4-5 interspace, incising through the old surgical scar. I then used electrocautery to perform a bilateral subperiosteal dissection exposing the spinous process and lamina of L4-5 bilaterally. We then obtained intraoperative radiograph to confirm our location. We then inserted the Verstrac retractor to provide exposure.  I began the decompression by using the high speed drill to perform laminotomies at L4-5 bilaterally. We then used the Kerrison punches to widen the laminotomy and removed the ligamentum flavum at L4-5 bilaterally. We used the Kerrison punches to remove the medial facets at L4-5 bilaterally. We performed wide foraminotomies about the bilateral L4 and L5 nerve roots completing the decompression.  We now turned our attention to the posterior lumbar interbody fusion. I used a scalpel to incise the intervertebral disc at L4-5 bilaterally. I then performed a partial intervertebral discectomy at L4-5 bilaterally using the pituitary forceps. We prepared the vertebral endplates at H8-8 bilaterally for the fusion by removing the soft tissues with the curettes. We then used the trial spacers to pick the appropriate sized interbody prosthesis. We prefilled his prosthesis with a combination of local morselized autograft bone that we obtained during the decompression as well as Zimmer DBM. We inserted the prefilled prosthesis into the interspace at L4-5 from the right, we then turned and expanded the prosthesis. There was a good snug fit of the prosthesis in the interspace. We then filled and the remainder of the intervertebral disc space with local morselized autograft bone and Zimmer DBM. This completed the posterior lumbar interbody arthrodesis.  We now turned attention to the instrumentation. Under  fluoroscopic guidance we cannulated the bilateral L4 and L5 pedicles with the  bone probe. We then removed the bone probe. We then tapped the pedicle with a 6.5 millimeter tap. We then removed the tap. We probed inside the tapped pedicle with a ball probe to rule out cortical breaches. We then inserted a 7.5 x 45 and 50 millimeter pedicle screw into the L4 and L5 pedicles bilaterally under fluoroscopic guidance. We then palpated along the medial aspect of the pedicles to rule out cortical breaches. There were none. The nerve roots were not injured. We then connected the unilateral pedicle screws with a lordotic rod. We compressed the construct and secured the rod in place with the caps. We then tightened the caps appropriately. This completed the instrumentation from L4-5 bilaterally.  We now turned our attention to the posterior lateral arthrodesis at L4-5 bilaterally. We used the high-speed drill to decorticate the remainder of the facets, pars, transverse process at L4-5 bilaterally. We then applied a combination of local morselized autograft bone and Zimmer DBM over these decorticated posterior lateral structures. This completed the posterior lateral arthrodesis.  We then obtained hemostasis using bipolar electrocautery. We irrigated the wound out with bacitracin solution. We inspected the thecal sac and nerve roots and noted they were well decompressed. We then removed the retractor. We placed vancomycin powder in the wound.  We injected Exparel . We reapproximated patient's thoracolumbar fascia with interrupted #1 Vicryl suture. We reapproximated patient's subcutaneous tissue with interrupted 2-0 Vicryl suture. The reapproximated patient's skin with Steri-Strips and benzoin. The wound was then coated with bacitracin ointment. A sterile dressing was applied. The drapes were removed. The patient was subsequently returned to the supine position where they were extubated by the anesthesia team. He was then  transported to the post anesthesia care unit in stable condition. All sponge instrument and needle counts were reportedly correct at the end of this case.

## 2019-02-20 NOTE — Anesthesia Procedure Notes (Signed)
Procedure Name: Intubation Date/Time: 02/20/2019 10:09 AM Performed by: Eligha Bridegroom, CRNA Pre-anesthesia Checklist: Patient identified, Emergency Drugs available, Suction available, Patient being monitored and Timeout performed Patient Re-evaluated:Patient Re-evaluated prior to induction Oxygen Delivery Method: Circle system utilized Preoxygenation: Pre-oxygenation with 100% oxygen Induction Type: IV induction Laryngoscope Size: Mac and 3 Grade View: Grade II Tube type: Oral Tube size: 7.0 mm Number of attempts: 1 Airway Equipment and Method: Stylet Placement Confirmation: ETT inserted through vocal cords under direct vision,  breath sounds checked- equal and bilateral and positive ETCO2 Secured at: 21 cm Tube secured with: Tape Dental Injury: Teeth and Oropharynx as per pre-operative assessment

## 2019-02-20 NOTE — Addendum Note (Signed)
Addendum  created 02/20/19 1854 by Eligha Bridegroom, CRNA   Intraprocedure Event edited, Intraprocedure Meds edited

## 2019-02-20 NOTE — Progress Notes (Signed)
Right eye reddened. Patient states it is from her personal facial mask rubbing her eyes.

## 2019-02-20 NOTE — H&P (Signed)
Subjective: The patient is a 73 year old white female who has complained of back and right greater left buttock and leg pain consistent with neurogenic claudication.  She has failed medical management and was worked up with lumbar x-rays and lumbar MRI which demonstrated an L4-5 spinal listhesis and spinal stenosis.  I discussed the various treatment options with her.  She has decided proceed with surgery.  Past Medical History:  Diagnosis Date  . Anxiety   . Arthritis    RA  . 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 Depression   . GERD (gastroesophageal reflux disease)   . History of blood transfusion    after miscarrige  . History of kidney stones    x 2  . Hypertension   . Hypothyroidism   . Incontinent of urine    incontinent at night  . Memory loss    "after TIA"  . Pneumonia    as a child and teenager  . Renal disorder    stage IV kidney disease  . Thyroid disease   . TIA (transient ischemic attack) 06/2018    Past Surgical History:  Procedure Laterality Date  . CHOLECYSTECTOMY    . DILATION AND CURETTAGE OF UTERUS    . EYE SURGERY Right    retina surgery  . KNEE SURGERY     bilat knees  . LUMBAR LAMINECTOMY/DECOMPRESSION MICRODISCECTOMY Right 04/12/2018   Procedure: MICRODISCECTOMY LUMBAR FOUR- LUMBAR FIVE;  Surgeon: Newman Pies, MD;  Location: [facility];  Service: Neurosurgery;  Laterality: Right;  MICRODISCECTOMY LUMBAR FOUR- LUMBAR FIVE  . TONSILLECTOMY      Allergies  Allergen Reactions  . Other Swelling    A bandage/gauze that was used on lower extremity wound at [facility] health, unsure type- Pt states her foot turned red and swelled up  . Penicillins Swelling and Rash    Has patient had a PCN reaction causing immediate rash, facial/tongue/throat swelling, SOB or lightheadedness with hypotension: Unknown Has patient had a PCN reaction causing severe rash involving mucus membranes or skin necrosis: Unknown Has patient had a PCN reaction  that required hospitalization: No Has patient had a PCN reaction occurring within the last 10 years: No If all of the above answers are "NO", then may proceed with Cephalosporin use.   . Sulfa Antibiotics Itching and Swelling  . Codeine Nausea Only  . Hydrocodone-Acetaminophen Other (See Comments)    Causes hallucinations  . Naproxen Sodium Swelling and Other (See Comments)    (ALEVE) Red Man Syndrome  . Tape Rash and Other (See Comments)    BURNS SKIN --PAPER TAPE IS OKAY    Social History   Tobacco Use  . Smoking status: Never Smoker  . Smokeless tobacco: Never Used  Substance Use Topics  . Alcohol use: No    Comment: rarely    History reviewed. No pertinent family history. Prior to Admission medications   Medication Sig Start Date End Date Taking? Authorizing Provider  aspirin 81 MG EC tablet Take 1 tablet (81 mg total) by mouth daily. 06/27/18  Yes Buriev, Arie Sabina, MD  baclofen (LIORESAL) 10 MG tablet Take 10 mg by mouth at bedtime as needed for muscle spasms.    Yes [provider]  cholecalciferol (VITAMIN D3) 25 MCG (1000 UT) tablet Take 1,000 Units by mouth daily.   Yes [provider]  DULoxetine (CYMBALTA) 60 MG capsule Take 60 mg by mouth at bedtime.   Yes [provider]  estrogen, conjugated,-medroxyprogesterone (PREMPRO)  0.625-2.5 MG per tablet Take 1 tablet by mouth daily.   Yes [provider]  Febuxostat 80 MG TABS Take 40 mg by mouth at bedtime.    Yes [provider]  furosemide (LASIX) 40 MG tablet Take 40 mg by mouth daily as needed for edema.    Yes [provider]  HYDROcodone-acetaminophen (NORCO/VICODIN) 5-325 MG tablet Take 1 tablet by mouth every 4 (four) hours as needed for moderate pain.  06/20/18  Yes [provider]  oxybutynin (DITROPAN) 5 MG tablet Take 5 mg by mouth daily.   Yes [provider]  pantoprazole (PROTONIX) 40 MG tablet Take 40 mg by mouth daily before breakfast.  02/22/18  Yes [provider]  predniSONE (DELTASONE) 5 MG tablet Take 10 mg by mouth daily with breakfast.   Yes [provider]  SYNTHROID 150 MCG tablet Take 150 mcg by mouth daily before breakfast. 01/05/18  Yes [provider]  telmisartan (MICARDIS) 40 MG tablet Take 40 mg by mouth at bedtime.  03/06/18  Yes [provider]  cyanocobalamin (,VITAMIN B-12,) 1000 MCG/ML injection Inject 1,000 mcg into the muscle every 30 (thirty) days.    [provider]     Review of Systems  Positive ROS: As above  All other systems have been reviewed and were otherwise negative with the exception of those mentioned in the HPI and as above.  Objective: Vital signs in last 24 hours: Temp:  [98.1 F (36.7 C)] 98.1 F (36.7 C) (05/06 0739) Pulse Rate:  [86] 86 (05/06 0739) Resp:  [18] 18 (05/06 0739) BP: (164-176)/(70-77) 164/77 (05/06 0814) SpO2:  [100 %] 100 % (05/06 0739) Weight:  [69.9 kg] 69.9 kg (05/06 0739) Estimated body mass index is 27.3 kg/m as calculated from the following:   Height as of this encounter: 5\' 3"  (1.6 m).   Weight as of this encounter: 69.9 kg.   General Appearance: Alert Head: Normocephalic, without obvious abnormality, atraumatic Eyes: PERRL, conjunctiva/corneas clear, EOM's intact,    Ears: Normal  Throat: Normal  Neck: Supple, Back: unremarkable Lungs: Clear to auscultation bilaterally, respirations unlabored Heart: Regular rate and rhythm, no murmur, rub or gallop Abdomen: Soft, non-tender Extremities: Extremities normal, atraumatic, no cyanosis or edema Skin: unremarkable  NEUROLOGIC:   Mental status: alert and oriented,Motor Exam - grossly normal Sensory Exam - grossly normal Reflexes:  Coordination - grossly normal Gait - grossly normal Balance - grossly normal Cranial Nerves: I: smell Not tested  II: visual acuity  OS: Normal  OD: Normal   II: visual fields Full to confrontation  II: pupils Equal,  round, reactive to light  III,VII: ptosis None  III,IV,VI: extraocular muscles  Full ROM  V: mastication Normal  V: facial light touch sensation  Normal  V,VII: corneal reflex  Present  VII: facial muscle function - upper  Normal  VII: facial muscle function - lower Normal  VIII: hearing Not tested  IX: soft palate elevation  Normal  IX,X: gag reflex Present  XI: trapezius strength  5/5  XI: sternocleidomastoid strength 5/5  XI: neck flexion strength  5/5  XII: tongue strength  Normal    Data Review Lab Results  Component Value Date   WBC 12.6 (H) 02/12/2019   HGB 13.1 02/12/2019   HCT 42.2 02/12/2019   MCV 100.2 (H) 02/12/2019   PLT 460 (H) 02/12/2019   Lab Results  Component Value Date   NA 138 02/12/2019   K 4.8 02/12/2019   CL 105  02/12/2019   CO2 19 (L) 02/12/2019   BUN 39 (H) 02/12/2019   CREATININE 2.52 (H) 02/12/2019   GLUCOSE 85 02/12/2019   Lab Results  Component Value Date   INR 1.06 06/24/2018    Assessment/Plan: L4-5 spondylolisthesis, spinal stenosis, lumbago, lumbar radiculopathy, neurogenic claudication: I have discussed the situation with the patient.  I reviewed her imaging studies with her and pointed out the abnormalities.  We have discussed the various treatment options including an L4-5 decompression, instrumentation and fusion.  I have described the surgery to her.  I have shown her surgical models.  I have given her a surgical pamphlet.  We have discussed the risks, benefits, alternatives, expected postoperative course, and likelihood of achieving our goals with surgery.  I have answered all the patient's questions.  She has decided to proceed with surgery.   Ophelia Charter 02/20/2019 9:46 AM

## 2019-02-20 NOTE — Progress Notes (Signed)
Pharmacy Antibiotic Note  Carolyn Robertson is a 73 y.o. female admitted on 02/20/2019 s/p spinal surgery.  Pharmacy has been consulted for vancomycin dosing for post-op prophylaxis. Vancomycin 1000 mg IV given at 08:06. Known CKD IV, SCr 2.52, CrCl ~18 ml/min.  Plan: Post-op dose of vancomycin not needed with reduced renal function Pharmacy signing off  Height: 5\' 3"  (160 cm) Weight: 154 lb 2 oz (69.9 kg) IBW/kg (Calculated) : 52.4  Temp (24hrs), Avg:97.6 F (36.4 C), Min:97.3 F (36.3 C), Max:98.1 F (36.7 C)  No results for input(s): WBC, CREATININE, LATICACIDVEN, VANCOTROUGH, VANCOPEAK, VANCORANDOM, GENTTROUGH, GENTPEAK, GENTRANDOM, TOBRATROUGH, TOBRAPEAK, TOBRARND, AMIKACINPEAK, AMIKACINTROU, AMIKACIN in the last 168 hours.  Estimated Creatinine Clearance: 18.4 mL/min (A) (by C-G formula based on SCr of 2.52 mg/dL (H)).    Allergies  Allergen Reactions  . Other Swelling    A bandage/gauze that was used on lower extremity wound at [facility], unsure type- Pt states her foot turned red and swelled up  . Penicillins Swelling and Rash    Has patient had a PCN reaction causing immediate rash, facial/tongue/throat swelling, SOB or lightheadedness with hypotension: Unknown Has patient had a PCN reaction causing severe rash involving mucus membranes or skin necrosis: Unknown Has patient had a PCN reaction that required hospitalization: No Has patient had a PCN reaction occurring within the last 10 years: No If all of the above answers are "NO", then may proceed with Cephalosporin use.   . Sulfa Antibiotics Itching and Swelling  . Codeine Nausea Only  . Hydrocodone-Acetaminophen Other (See Comments)    Causes hallucinations  . Naproxen Sodium Swelling and Other (See Comments)    (ALEVE) Red Man Syndrome  . Tape Rash and Other (See Comments)    BURNS SKIN --PAPER TAPE IS OKAY    Thank you for allowing pharmacy to be a part of this patient's care.  Renold Genta, PharmD,  BCPS Clinical Pharmacist Clinical phone for 02/20/2019 until 10p is [phone] 02/20/2019 2:59 PM  **Pharmacist phone directory can now be found on [email or web address] listed under [place]**

## 2019-02-20 NOTE — TOC Initial Note (Signed)
Transition of Care [facility]) - Initial/Assessment Note    Patient Details  Name: Carolyn Robertson MRN: [id number] Date of Birth: Feb 14, 1946  Transition of Care [facility]) CM/SW Contact:    Pollie Friar, RN Phone Number: 02/20/2019, 4:10 PM  Clinical Narrative:                 Awaiting PT/OT evals. CM following for d/c needs, physician orders.  She denies transportation needs. She states there are no issues with obtaining or taking home meds.  Expected Discharge Plan: Home/Self Care Barriers to Discharge: Continued Medical Work up   Patient Goals and CMS Choice        Expected Discharge Plan and Services Expected Discharge Plan: Home/Self Care       Living arrangements for the past 2 months: Apartment(condo--1 level)                                      Prior Living Arrangements/Services Living arrangements for the past 2 months: Apartment(condo--1 level) Lives with:: Spouse Patient language and need for interpreter reviewed:: Yes(no needs) Do you feel safe going back to the place where you live?: Yes          Current home services: DME(cane/ walker) Criminal Activity/Legal Involvement Pertinent to Current Situation/Hospitalization: No - Comment as needed  Activities of Daily Living      Permission Sought/Granted                  Emotional Assessment Appearance:: Appears stated age Attitude/Demeanor/Rapport: Sedated Affect (typically observed): Accepting, Pleasant Orientation: : Oriented to Self, Oriented to Place, Oriented to  Time, Oriented to Situation   Psych Involvement: No (comment)  Admission diagnosis:  Spondylolisthesis, Lumbar region Patient Active Problem List   Diagnosis Date Noted  . Spondylolisthesis, lumbar region 02/20/2019  . TIA (transient ischemic attack) 06/25/2018  . CVA (cerebrovascular accident) ([place]) 06/24/2018    Class: Acute  . Lumbar herniated disc 04/12/2018  . Stage 4 chronic kidney disease ([place]) 02/19/2018  .  Chronic venous insufficiency 02/19/2018  . Primary localized osteoarthritis of right knee 11/14/2016   PCP:  Burnard Bunting, MD Pharmacy:   [facility] [address], [place] - [place] [address] [place] [address] Phone: [phone] Fax: [phone]     Social Determinants of Health (SDOH) Interventions    Readmission Risk Interventions No flowsheet data found.

## 2019-02-21 LAB — CBC
HCT: 31.6 % — ABNORMAL LOW (ref 36.0–46.0)
Hemoglobin: 9.9 g/dL — ABNORMAL LOW (ref 12.0–15.0)
MCH: 31.5 pg (ref 26.0–34.0)
MCHC: 31.3 g/dL (ref 30.0–36.0)
MCV: 100.6 fL — ABNORMAL HIGH (ref 80.0–100.0)
Platelets: 307 10*3/uL (ref 150–400)
RBC: 3.14 MIL/uL — ABNORMAL LOW (ref 3.87–5.11)
RDW: 14.6 % (ref 11.5–15.5)
WBC: 14 10*3/uL — ABNORMAL HIGH (ref 4.0–10.5)
nRBC: 0 % (ref 0.0–0.2)

## 2019-02-21 LAB — BASIC METABOLIC PANEL
Anion gap: 12 (ref 5–15)
BUN: 38 mg/dL — ABNORMAL HIGH (ref 8–23)
CO2: 19 mmol/L — ABNORMAL LOW (ref 22–32)
Calcium: 8.1 mg/dL — ABNORMAL LOW (ref 8.9–10.3)
Chloride: 106 mmol/L (ref 98–111)
Creatinine, Ser: 2.32 mg/dL — ABNORMAL HIGH (ref 0.44–1.00)
GFR calc Af Amer: 23 mL/min — ABNORMAL LOW (ref >60)
GFR calc non Af Amer: 20 mL/min — ABNORMAL LOW (ref >60)
Glucose, Bld: 149 mg/dL — ABNORMAL HIGH (ref 70–99)
Potassium: 4.7 mmol/L (ref 3.5–5.1)
Sodium: 137 mmol/L (ref 135–145)

## 2019-02-21 MED ORDER — CYCLOBENZAPRINE HCL 10 MG PO TABS: 10.0000 mg | ORAL_TABLET | Freq: Three times a day (TID) | ORAL | 0 refills | Status: DC | PRN

## 2019-02-21 MED ORDER — DOCUSATE SODIUM 100 MG PO CAPS: 100.0000 mg | ORAL_CAPSULE | Freq: Two times a day (BID) | ORAL | 0 refills | Status: DC

## 2019-02-21 MED ORDER — HYDROCODONE-ACETAMINOPHEN 10-325 MG PO TABS: 1.0000 | ORAL_TABLET | ORAL | 0 refills | Status: DC | PRN

## 2019-02-21 MED FILL — Thrombin For Soln 5000 Unit: CUTANEOUS | Qty: 5000 | Status: AC

## 2019-02-21 MED FILL — Gelatin Absorbable MT Powder: OROMUCOSAL | Qty: 1 | Status: AC

## 2019-02-21 NOTE — Evaluation (Signed)
Physical Therapy Evaluation Patient Details Name: Carolyn Robertson MRN: [id number] DOB: 1946-07-22 Today's Date: 02/21/2019   History of Present Illness  73 yo female s/p s/p redo laminotomy/foraminotomies/fasciectomy, L4-L5 PLIF, interbody prosthesis on 02/20/19. Pt with previous L4-L5 discectomy, laminectomy, decompression. PMH includes anxiety, RA, asthma, depression, GERD, HTN, TIA.   Clinical Impression   Pt presents with generalized weakness, back pain, increased time and effort with mobility tasks, decreased knowledge of back precautions, and decreased activity tolerance. Pt to benefit from acute PT to address deficits. Pt ambulated hallway distance with RW with min guard assist, pt limited by fatigue and back pain. Pt with HR up to 140 bpm during ambulation, recovered to low 100s with rest. RN notified. Sats WNL. Pt maintained back precautions with min verbal cuing, pt given handout to reinforce these points. PT recommending HHPT to address mobility deficits and return pt to PLOF. Pt states husband can support her and provide min assist as needed, but has some health problems of his own. Pt endorses she has friends that can assist her as needed.  PT to progress mobility as tolerated, and will continue to follow acutely.      Follow Up Recommendations Home health PT;Supervision for mobility/OOB    Equipment Recommendations  None recommended by PT    Recommendations for Other Services       Precautions / Restrictions Precautions Precautions: Fall;Back Precaution Booklet Issued: Yes (comment) Precaution Comments: PT reviewed, demonstrated, and practiced no bending, lifting, twisting, and arching with pt; log roll technique in and out of bed emphasized.  Required Braces or Orthoses: Spinal Brace Spinal Brace: Lumbar corset;Applied in sitting position Restrictions Weight Bearing Restrictions: No      Mobility  Bed Mobility Overal bed mobility: Needs Assistance Bed Mobility:  Rolling;Sidelying to Sit;Sit to Sidelying Rolling: Min guard Sidelying to sit: Min assist     Sit to sidelying: Min assist General bed mobility comments: Min guard for rolling for safety, verbal cuing reinforcing log roll technique. Min assist for sidelying<>sit for LE lifting and translation, pt able to elevate and lower trunk herself.   Transfers Overall transfer level: Needs assistance Equipment used: Rolling walker (2 wheeled) Transfers: Sit to/from Stand Sit to Stand: Min guard         General transfer comment: Min guard for safety, pt with increased time to come to full standing. Sit to stand x2, once prior to ambulation and once post-bed mobility training post-ambulation. Pt initially with posterior leaning upon standing, pt with improved standing posture on second attempt.  Ambulation/Gait Ambulation/Gait assistance: Min guard;Supervision Gait Distance (Feet): 100 Feet Assistive device: Rolling walker (2 wheeled) Gait Pattern/deviations: Step-through pattern;Decreased stride length;Trunk flexed Gait velocity: decr  Gait velocity interpretation: <1.31 ft/sec, indicative of household ambulator General Gait Details: Min guard to supervision for safety. Verbal cuing for placement in RW, upright posture x3. Pt with HR up to 140 bpm during ambulation, recovered to low 100s with rest. RN notified. Sats WNL.  Stairs            Wheelchair Mobility    Modified Rankin (Stroke Patients Only)       Balance Overall balance assessment: Needs assistance;History of Falls Sitting-balance support: No upper extremity supported;Feet supported Sitting balance-Leahy Scale: Good     Standing balance support: Bilateral upper extremity supported Standing balance-Leahy Scale: Fair Standing balance comment: Able to stand without UE support, requires RW for dynamic standing balance.  Pertinent Vitals/Pain Pain Assessment: 0-10 Pain Score: 7   Pain Location: low back Pain Descriptors / Indicators: Sore Pain Intervention(s): Limited activity within patient's tolerance;Monitored during session;Repositioned    Home Living Family/patient expects to be discharged to:: Private residence Living Arrangements: Spouse/significant other Available Help at Discharge: Family;Friend(s);Available 24 hours/day Type of Home: Other(Comment)(townhouse) Home Access: Level entry     Home Layout: One level Home Equipment: Cane - single point;Walker - 4 wheels;Walker - 2 wheels;Wheelchair - [employer];Shower seat Additional Comments: Pt reports her spouse has cardiac and back problems, so he can assist some but not for everything. Pt states she has friends from her previous job that can assist her if needed.    Prior Function Level of Independence: Needs assistance   Gait / Transfers Assistance Needed: Pt was using cane for ambulation, then 2 weeks ago pt fell and has required RW since.   ADL's / Homemaking Assistance Needed: Pt reports needing SBA from husband during bathing, and pt and spouse have a Chartered certified accountant.         Hand Dominance   Dominant Hand: Right    Extremity/Trunk Assessment   Upper Extremity Assessment Upper Extremity Assessment: Overall WFL for tasks assessed    Lower Extremity Assessment Lower Extremity Assessment: Generalized weakness    Cervical / Trunk Assessment Cervical / Trunk Assessment: Normal  Communication   Communication: No difficulties  Cognition Arousal/Alertness: Awake/alert Behavior During Therapy: WFL for tasks assessed/performed Overall Cognitive Status: Within Functional Limits for tasks assessed                                 General Comments: Pt talkative and pleasant, can be difficult to redirect      General Comments      Exercises     Assessment/Plan    PT Assessment Patient needs continued PT services  PT Problem List Decreased mobility;Decreased  strength;Decreased range of motion;Decreased knowledge of precautions;Decreased activity tolerance;Decreased balance;Pain       PT Treatment Interventions DME instruction;Functional mobility training;Balance training;Patient/family education;Gait training;Therapeutic activities;Therapeutic exercise    PT Goals (Current goals can be found in the Care Plan section)  Acute Rehab PT Goals Patient Stated Goal: go home with husband PT Goal Formulation: With patient Time For Goal Achievement: 03/07/19 Potential to Achieve Goals: Good    Frequency Min 5X/week   Barriers to discharge        Co-evaluation               AM-PAC PT "6 Clicks" Mobility  Outcome Measure Help needed turning from your back to your side while in a flat bed without using bedrails?: A Little Help needed moving from lying on your back to sitting on the side of a flat bed without using bedrails?: A Little Help needed moving to and from a bed to a chair (including a wheelchair)?: A Little Help needed standing up from a chair using your arms (e.g., wheelchair or bedside chair)?: A Little Help needed to walk in hospital room?: A Little Help needed climbing 3-5 steps with a railing? : A Little 6 Click Score: 18    End of Session Equipment Utilized During Treatment: Gait belt;Back brace Activity Tolerance: Patient tolerated treatment well;Other (comment)(tachycardia, RN notified) Patient left: in chair;with call bell/phone within reach;with chair alarm set Nurse Communication: Mobility status PT Visit Diagnosis: Other abnormalities of gait and mobility (R26.89);History of falling (Z91.81);Pain Pain - Right/Left: (low)  Pain - part of body: (back)    Time: 0982-8675 PT Time Calculation (min) (ACUTE ONLY): 37 min   Charges:   PT Evaluation $PT Eval Low Complexity: 1 Low PT Treatments $Gait Training: 8-22 mins       Julien Girt, PT Acute Rehabilitation Services Pager [phone]  Office  [phone]   Halie Gass D Maiyah Goyne 02/21/2019, 11:12 AM

## 2019-02-21 NOTE — Progress Notes (Signed)
Foley catheter is removed per ordered, pt tolerated procedure well, will closely monitor  post foley removal protocol.

## 2019-02-21 NOTE — Discharge Instructions (Signed)
Spinal Fusion, Adult, Care After This sheet gives you information about how to care for yourself after your procedure. Your doctor may also give you more specific instructions. If you have problems or questions, contact your doctor. Follow these instructions at home: Medicines  Take over-the-counter and prescription medicines only as told by your doctor. These include any medicines for pain or blood-thinning medicines (anticoagulants).  If you were prescribed an antibiotic medicine, take it as told by your doctor. Do not stop taking the antibiotic even if you start to feel better.  Do not drive for 24 hours if you were given a medicine to help you relax (sedative) during your procedure.  Do not drive or use heavy machinery while taking prescription pain medicine. If you have a brace:  Wear the brace as told by your doctor. Take it off only as told by your doctor.  Keep the brace clean. Managing pain, stiffness, and swelling  If directed, put ice on the surgery area: ? If you have a removable brace, take it off as told by your doctor. ? Put ice in a plastic bag. ? Place a towel between your skin and the bag. ? Leave the ice on for 20 minutes, 2-3 times a day. Surgery cut care   Follow instructions from your doctor about how to take care of your cut from surgery (incision). Make sure you: ? Wash your hands with soap and water before you change your bandage (dressing). If you cannot use soap and water, use hand sanitizer. ? Change your bandage as told by your doctor. ? Leave stitches (sutures), skin glue, or skin tape (adhesive) strips in place. They may need to stay in place for 2 weeks or longer. If tape strips get loose and curl up, you may trim the loose edges. Do not remove tape strips completely unless your doctor says it is okay.  Keep your cut from surgery clean and dry. ? Do not take baths, swim, or use a hot tub until your doctor says it is okay. ? Ask your doctor if you can  take showers. You may only be allowed to take sponge baths.  Every day, check your cut from surgery and the area around it for: ? More redness, swelling, or pain. ? Fluid or blood. ? Warmth. ? Pus or a bad smell.  If you have a drain tube, follow instructions from your doctor about caring for it. Do not take out the drain tube or any bandages unless your doctor says it is okay. Physical activity  Rest and protect your back as much as possible.  Follow instructions from your doctor about how to move. Use good posture to help your spine heal.  Do not lift anything that is heavier than 8 lb (3.6 kg), or the limit that you are told, until your doctor says that it is safe.  Do not twist or bend at the waist until your doctor says it is okay.  It is best if you: ? Do not make pushing and pulling motions. ? Do not sit or lie down in the same position for a long time. ? Do not raise your hands or arms above your head.  Return to your normal activities as told by your doctor. Ask your doctor what activities are safe for you. Rest and protect your back as much as you can.  Do not start to exercise until your doctor says it is okay. Ask your doctor what kinds of exercise you can do  to make your back stronger. General instructions  To prevent blood clots and lessen swelling in your legs: ? Wear compression stockings as told. ? Walk one or more times every few hours as told by your doctor.  Do not use any products that contain nicotine or tobacco, such as cigarettes and e-cigarettes. These can delay bone healing. If you need help quitting, ask your doctor.  To prevent or treat constipation while you are taking prescription pain medicine, your doctor may suggest that you: ? Drink enough fluid to keep your pee (urine) pale yellow. ? Take over-the-counter or prescription medicines. ? Eat foods that are high in fiber. These include fresh fruits and vegetables, whole grains, and beans. ? Limit  foods that are high in fat and processed sugars, such as fried and sweet foods.  Keep all follow-up visits as told by your doctor. This is important. Contact a doctor if:  Your pain gets worse.  Your medicine does not help your pain.  Your legs or feet get painful or swollen.  Your cut from surgery is more red, swollen, or painful.  Your cut from surgery feels warm to the touch.  You have: ? Fluid or blood coming from your cut from surgery. ? Pus or a bad smell coming from your cut from surgery. ? A fever. ? Weakness or loss of feeling (numbness) in your legs that is new or getting worse. ? Trouble controlling when you pee (urinate) or poop (have a bowel movement).  You feel sick to your stomach (nauseous).  You throw up (vomit). Get help right away if:  Your pain is very bad.  You have chest pain.  You have trouble breathing.  You start to have a cough. These symptoms may be an emergency. Do not wait to see if the symptoms will go away. Get medical help right away. Call your local emergency services (911 in the [place] Do not drive yourself to the hospital. Summary  After the procedure, it is common to have pain in your back and pain by your surgery cut(s).  Icing and pain medicines may help to control the pain. Follow directions from your doctor.  Rest and protect your back as much as possible. Do not twist or bend at the waist.  Get up and walk one or more times every few hours as told by your doctor. This information is not intended to replace advice given to you by your health care provider. Make sure you discuss any questions you have with your health care provider. Document Released: 01/27/2011 Document Revised: 01/17/2017 Document Reviewed: 01/17/2017 Elsevier Interactive Patient Education  2019 [employer].

## 2019-02-21 NOTE — Discharge Summary (Signed)
Physician Discharge Summary  Patient ID: Carolyn Robertson MRN: [id number] DOB/AGE: 02-09-1946 73 y.o.  Admit date: 02/20/2019 Discharge date: 02/21/2019  Admission Diagnoses:  Discharge Diagnoses:  Active Problems:   Spondylolisthesis, lumbar region   Discharged Condition: good  Hospital Course: [name] was admitted on 02/20/2019 to undergo a bilateral redo L4-5 laminotomy/foraminotomies/medial facetectomy to decompress the bilateral L4 and L5 nerve roots and an L4-5 transforaminal lumbar interbody fusion. She worked with physical and occupational therapies. She has done well post-operatively and is ready to discharge home.  Consults: rehabilitation medicine  Significant Diagnostic Studies: Dg Lumbar Spine 2-3 Views  Result Date: 02/20/2019 CLINICAL DATA:  L4-5 PLIF EXAM: LUMBAR SPINE - 2-3 VIEW; DG C-ARM 61-120 MIN COMPARISON:  None. FINDINGS: Two C-arm images show posterior decompression, diskectomy and fusion at L4-5. Components appear well positioned. Posterior rods are not yet positioned. IMPRESSION: L4-5 PLIF. Electronically Signed   By: Nelson Chimes M.D.   On: 02/20/2019 13:36   Dg Lumbar Spine 1 View  Result Date: 02/20/2019 CLINICAL DATA:  L4-5 PLIF EXAM: LUMBAR SPINE - 1 VIEW COMPARISON:  11/12/2018 FINDINGS: L4-5 advanced disc degeneration with anterolisthesis. Spinal numbering is based on preoperative MRI. A probe projects at the L4-5 interspinous space. IMPRESSION: Intraoperative localization at L4-5. Electronically Signed   By: Monte Fantasia M.D.   On: 02/20/2019 11:20   Dg C-arm 1-60 Min  Result Date: 02/20/2019 CLINICAL DATA:  L4-5 PLIF EXAM: LUMBAR SPINE - 2-3 VIEW; DG C-ARM 61-120 MIN COMPARISON:  None. FINDINGS: Two C-arm images show posterior decompression, diskectomy and fusion at L4-5. Components appear well positioned. Posterior rods are not yet positioned. IMPRESSION: L4-5 PLIF. Electronically Signed   By: Nelson Chimes M.D.   On: 02/20/2019 13:36     Treatments:  surgery: Bilateral redo L4-5 laminotomy/foraminotomies/medial facetectomy to decompress the bilateral L4 and L5 nerve roots(the work required to do this was in addition to the work required to do the posterior lumbar interbody fusion because of the patient's spinal stenosis, facet arthropathy. Etc. requiring a wide decompression of the nerve roots.);  L4-5 transforaminal lumbar interbody fusion with local morselized autograft bone and Zimmer DBM; insertion of interbody prosthesis at L4-5 (globus peek expandable interbody prosthesis); posterior nonsegmental instrumentation from L4 to L5 with globus titanium pedicle screws and rods; posterior lateral arthrodesis at L4-5 with local morselized autograft bone and Zimmer DBM.  Discharge Exam: Blood pressure 134/83, pulse 87, temperature 98.4 F (36.9 C), temperature source Oral, resp. rate 20, height 5\' 3"  (1.6 m), weight 69.9 kg, SpO2 99 %.   Alert and oriented x 4 MAE Incision is covered with honeycomb dressing that is clean, dry, and intact  Disposition: Discharge disposition: 01-Home or Self Care       Discharge Instructions    Face-to-face encounter (required for Medicare/Medicaid patients)   Complete by:  As directed    I [name] certify that this patient is under my care and that I, or a nurse practitioner or physician's assistant working with me, had a face-to-face encounter that meets the physician face-to-face encounter requirements with this patient on 02/21/2019. The encounter with the patient was in whole, or in part for the following medical condition(s) which is the primary reason for home health care (List medical condition): L4-5 spondylolisthesis, foraminal stenosis, degenerative disc disease, spinal stenosis compressing both the L4 and the L5 nerve roots; s/p L4-5 PLIF   The encounter with the patient was in whole, or in part, for the following medical  condition, which is the primary reason for home health care:  L4-5  spondylolisthesis, foraminal stenosis, degenerative disc disease, spinal stenosis compressing both the L4 and the L5 nerve roots; lumbago; s/p A1-O8 PLIF   I certify that, based on my findings, the following services are medically necessary home health services:  Physical therapy   Reason for Medically Necessary Home Health Services:  Therapy- Therapeutic Exercises to Increase Strength and Endurance   My clinical findings support the need for the above services:  Unable to leave home safely without assistance and/or assistive device   Further, I certify that my clinical findings support that this patient is homebound due to:  Unable to leave home safely without assistance   Home Health   Complete by:  As directed    To provide the following care/treatments:  PT     Allergies as of 02/21/2019      Reactions   Other Swelling   A bandage/gauze that was used on lower extremity wound at [facility] health, unsure type- Pt states her foot turned red and swelled up   Penicillins Swelling, Rash   Has patient had a PCN reaction causing immediate rash, facial/tongue/throat swelling, SOB or lightheadedness with hypotension: Unknown Has patient had a PCN reaction causing severe rash involving mucus membranes or skin necrosis: Unknown Has patient had a PCN reaction that required hospitalization: No Has patient had a PCN reaction occurring within the last 10 years: No If all of the above answers are "NO", then may proceed with Cephalosporin use.   Sulfa Antibiotics Itching, Swelling   Codeine Nausea Only   Hydrocodone-acetaminophen Other (See Comments)   Causes hallucinations   Naproxen Sodium Swelling, Other (See Comments)   (ALEVE) Red Man Syndrome   Tape Rash, Other (See Comments)   BURNS SKIN --PAPER TAPE IS OKAY      Medication List    STOP taking these medications   HYDROcodone-acetaminophen 5-325 MG tablet Commonly known as:  NORCO/VICODIN Replaced by:  HYDROcodone-acetaminophen 10-325 MG  tablet     TAKE these medications   aspirin 81 MG EC tablet Take 1 tablet (81 mg total) by mouth daily.   baclofen 10 MG tablet Commonly known as:  LIORESAL Take 10 mg by mouth at bedtime as needed for muscle spasms.   cholecalciferol 25 MCG (1000 UT) tablet Commonly known as:  VITAMIN D3 Take 1,000 Units by mouth daily.   cyanocobalamin 1000 MCG/ML injection Commonly known as:  (VITAMIN B-12) Inject 1,000 mcg into the muscle every 30 (thirty) days.   cyclobenzaprine 10 MG tablet Commonly known as:  FLEXERIL Take 1 tablet (10 mg total) by mouth 3 (three) times daily as needed for muscle spasms.   docusate sodium 100 MG capsule Commonly known as:  COLACE Take 1 capsule (100 mg total) by mouth 2 (two) times daily.   DULoxetine 60 MG capsule Commonly known as:  CYMBALTA Take 60 mg by mouth at bedtime.   estrogen (conjugated)-medroxyprogesterone 0.625-2.5 MG tablet Commonly known as:  PREMPRO Take 1 tablet by mouth daily.   Febuxostat 80 MG Tabs Take 40 mg by mouth at bedtime.   furosemide 40 MG tablet Commonly known as:  LASIX Take 40 mg by mouth daily as needed for edema.   HYDROcodone-acetaminophen 10-325 MG tablet Commonly known as:  NORCO Take 1 tablet by mouth every 4 (four) hours as needed for moderate pain. Replaces:  HYDROcodone-acetaminophen 5-325 MG tablet   oxybutynin 5 MG tablet Commonly known as:  DITROPAN Take 5 mg  by mouth daily.   pantoprazole 40 MG tablet Commonly known as:  PROTONIX Take 40 mg by mouth daily before breakfast.   predniSONE 5 MG tablet Commonly known as:  DELTASONE Take 10 mg by mouth daily with breakfast.   Synthroid 150 MCG tablet Generic drug:  levothyroxine Take 150 mcg by mouth daily before breakfast.   telmisartan 40 MG tablet Commonly known as:  MICARDIS Take 40 mg by mouth at bedtime.            Durable Medical Equipment  (From admission, onward)         Start     Ordered   02/21/19 1503  For home use  only DME Bedside commode  Once    Question:  Patient needs a bedside commode to treat with the following condition  Answer:  Abnormality of gait and mobility   02/21/19 [place], [facility] Follow up.   Specialty:  Home Health Services Why:  Physical Therapy-office to call you to schedule time for a visit.  Contact information: [place] [address] [phone]        Newman Pies, MD. Schedule an appointment as soon as possible for a visit in 2 week(s).   Specialty:  Neurosurgery Contact information: [address] [place] [address] [phone]           Signed: [name] 02/21/2019, 3:16 PM

## 2019-02-21 NOTE — TOC Transition Note (Signed)
Transition of Care [facility]) - CM/SW Discharge Note   Patient Details  Name: Carolyn Robertson MRN: [id number] Date of Birth: 1946-04-17  Transition of Care [facility]) CM/SW Contact:  Bethena Roys, RN Phone Number: 02/21/2019, 3:21 PM   Clinical Narrative:   DME 3n1 to be delivered to the room via [place]. [facility] [place] is aware that patient will transition home. No further needs from CM at this time.     Final next level of care: Home w Home Health Services Barriers to Discharge: No Barriers Identified   Patient Goals and CMS Choice Patient states their goals for this hospitalization and ongoing recovery are:: to get home- her husband is not feeling well CMS Medicare.gov Compare Post Acute Care list provided to:: Patient Choice offered to / list presented to : Patient  Discharge Placement                       Discharge Plan and Services In-house Referral: NA Discharge Planning Services: CM Consult Post Acute Care Choice: Home Health          DME Arranged: 3-N-1 DME Agency: [employer] Date DME Agency Contacted: 02/21/19 Time DME Agency Contacted: [phone] Representative spoke with at DME Agency: [place]: PT [place]: [facility] [place] Date [place]: 02/21/19 Time [place]: 1214 Representative spoke with at [place]: [place] (SDOH) Interventions     Readmission Risk Interventions No flowsheet data found.

## 2019-02-21 NOTE — Evaluation (Signed)
Occupational Therapy Evaluation Patient Details Name: Carolyn Robertson MRN: [id number] DOB: 13-May-1946 Today's Date: 02/21/2019    History of Present Illness 73 yo female s/p s/p redo laminotomy/foraminotomies/fasciectomy, L4-L5 PLIF, interbody prosthesis on 02/20/19. Pt with previous L4-L5 discectomy, laminectomy, decompression. PMH includes anxiety, RA, asthma, depression, GERD, HTN, TIA.    Clinical Impression   Pt PTA: Pt requiring assist for LB ADL. Pt reports increased time for functional tasks. Pt currently performing ADL functional mobility with minguardA with RW; pt performing LB dressing with AE. Pt donning socks and outfit for discharge with supervisionA with hip kit. Pt education on LB dressing/bathing with AE as well as back precautions. Pt performing toilet hygiene with [place]. Pt required few cues for back precautions at this time. Weakness and increased assist required for all functional tasks. Pt would greatly benefit from continued OT skilled services for ADL, mobility and safety. Pt with low commode and requires BSC for safety and use in PM. OT to follow acutely.    Follow Up Recommendations  Home health OT;Supervision - Intermittent    Equipment Recommendations  3 in 1 bedside commode    Recommendations for Other Services       Precautions / Restrictions Precautions Precautions: Fall;Back Precaution Booklet Issued: Yes (comment) Precaution Comments: reviewed and education performed Required Braces or Orthoses: Spinal Brace Spinal Brace: Lumbar corset;Applied in sitting position Restrictions Weight Bearing Restrictions: No      Mobility Bed Mobility Overal bed mobility: Needs Assistance Bed Mobility: Rolling;Sidelying to Sit;Sit to Sidelying Rolling: Supervision Sidelying to sit: Min guard     Sit to sidelying: Min guard General bed mobility comments: Min guard for rolling for safety, verbal cuing reinforcing log roll technique. Min assist for  sidelying<>sit for LE lifting and translation, pt able to elevate and lower trunk herself.   Transfers Overall transfer level: Needs assistance Equipment used: Rolling walker (2 wheeled) Transfers: Sit to/from Stand Sit to Stand: Min guard         General transfer comment: minguardA for stability    Balance Overall balance assessment: Needs assistance;History of Falls Sitting-balance support: No upper extremity supported;Feet supported Sitting balance-Leahy Scale: Good     Standing balance support: Bilateral upper extremity supported Standing balance-Leahy Scale: Fair Standing balance comment: Able to stand without UE support, requires RW for dynamic standing balance.                            ADL either performed or assessed with clinical judgement   ADL Overall ADL's : Needs assistance/impaired Eating/Feeding: Modified independent;Sitting   Grooming: Supervision/safety;Standing   Upper Body Bathing: Supervision/ safety;Sitting   Lower Body Bathing: Minimal assistance;+2 for safety/equipment;+2 for physical assistance;Sitting/lateral leans;Sit to/from stand   Upper Body Dressing : Supervision/safety;Sitting   Lower Body Dressing: Minimal assistance;Sit to/from stand;Sitting/lateral leans;With adaptive equipment   Toilet Transfer: Min guard;Ambulation;RW   Toileting- Clothing Manipulation and Hygiene: Minimal assistance;Sitting/lateral lean;Sit to/from stand       Functional mobility during ADLs: Supervision/safety;Rolling walker General ADL Comments: set-upA for UB ADL; minA for LB ADL and decreases to modified independence with ADL LB AE.     Vision Baseline Vision/History: No visual deficits Vision Assessment?: No apparent visual deficits     Perception     Praxis      Pertinent Vitals/Pain Pain Assessment: Faces Faces Pain Scale: Hurts little more Pain Location: low back Pain Descriptors / Indicators: Sore Pain Intervention(s): Limited  activity within patient's tolerance  Hand Dominance Right   Extremity/Trunk Assessment Upper Extremity Assessment Upper Extremity Assessment: Overall WFL for tasks assessed   Lower Extremity Assessment Lower Extremity Assessment: Generalized weakness   Cervical / Trunk Assessment Cervical / Trunk Assessment: Normal   Communication Communication Communication: No difficulties   Cognition Arousal/Alertness: Awake/alert Behavior During Therapy: WFL for tasks assessed/performed Overall Cognitive Status: Within Functional Limits for tasks assessed                                 General Comments: Pt talkative and pleasant, can be difficult to redirect   General Comments       Exercises     Shoulder Instructions      Home Living Family/patient expects to be discharged to:: Private residence Living Arrangements: Spouse/significant other Available Help at Discharge: Family;Friend(s);Available 24 hours/day Type of Home: Other(Comment)(in between homes) Home Access: Level entry     Home Layout: One level     Bathroom Shower/Tub: Teacher, early years/pre: Standard     Home Equipment: Cane - single point;Walker - 4 wheels;Walker - 2 wheels;Wheelchair - [employer];Shower seat   Additional Comments: Pt reports her spouse has cardiac and back problems, so he can assist some but not for everything. Pt states she has friends from her previous job that can assist her if needed.      Prior Functioning/Environment Level of Independence: Needs assistance  Gait / Transfers Assistance Needed: Pt was using cane for ambulation, then 2 weeks ago pt fell and has required RW since.  ADL's / Homemaking Assistance Needed: Pt reports needing SBA from husband during bathing, and pt and spouse have a Chartered certified accountant.             OT Problem List: Decreased strength;Decreased activity tolerance;Impaired balance (sitting and/or standing);Decreased safety  awareness;Pain;Decreased knowledge of use of DME or AE      OT Treatment/Interventions: Self-care/ADL training;Therapeutic exercise;Neuromuscular education;Therapeutic activities;Patient/family education;Balance training    OT Goals(Current goals can be found in the care plan section) Acute Rehab OT Goals Patient Stated Goal: go home with husband OT Goal Formulation: With patient Time For Goal Achievement: 03/07/19 Potential to Achieve Goals: Good ADL Goals Pt Will Perform Lower Body Bathing: with modified independence;with adaptive equipment Pt Will Perform Lower Body Dressing: with modified independence;with adaptive equipment Additional ADL Goal #1: Pt will perform ADL tasks x10 mins with fair balance in standing and sitting with modified independence.  OT Frequency: Min 2X/week   Barriers to D/C: Decreased caregiver support          Co-evaluation              AM-PAC OT "6 Clicks" Daily Activity     Outcome Measure Help from another person eating meals?: None Help from another person taking care of personal grooming?: None Help from another person toileting, which includes using toliet, bedpan, or urinal?: A Little Help from another person bathing (including washing, rinsing, drying)?: A Little Help from another person to put on and taking off regular upper body clothing?: None Help from another person to put on and taking off regular lower body clothing?: A Little 6 Click Score: 21   End of Session Equipment Utilized During Treatment: Gait belt;Rolling walker Nurse Communication: Mobility status;Other (comment)(need for 3in1 commode)  Activity Tolerance: Patient tolerated treatment well Patient left: in bed;with call bell/phone within reach  OT Visit Diagnosis: Unsteadiness on feet (R26.81);Muscle weakness (generalized) (  M62.81)                Time: 1430-1500 OT Time Calculation (min): 30 min Charges:  OT General Charges $OT Visit: 1 Visit OT Evaluation $OT Eval  Moderate Complexity: 1 Mod OT Treatments $Self Care/Home Management : 8-22 mins  Ebony Hail Harold Hedge) Marsa Aris OTR/L Acute Rehabilitation Services Pager: [phone] Office: [place] 02/21/2019, 3:44 PM

## 2019-02-21 NOTE — TOC Transition Note (Addendum)
Transition of Care [facility]) - CM/SW Discharge Note   Patient Details  Name: Carolyn Robertson MRN: [id number] Date of Birth: Jan 03, 1946  Transition of Care [facility]) CM/SW Contact:  Bethena Roys, RN Phone Number: 02/21/2019, 12:15 PM   Clinical Narrative:  Pt plan to transition home once stable. Pt is agreeable to [facility] PT Services. No DME recommended-Pt has DME RW and [employer].  Medicare.gov list provided and patient chose [facility]. Pt will need HH PT orders and F2F. Referral provided to [facility] with [facility] and [facility] to begin within 24-48 hours post transition home. No further needs from CM at this time.       Final next level of care: Home w Home Health Services Barriers to Discharge: No Barriers Identified   Patient Goals and CMS Choice Patient states their goals for this hospitalization and ongoing recovery are:: to get home- her husband is not feeling well CMS Medicare.gov Compare Post Acute Care list provided to:: Patient Choice offered to / list presented to : Patient  Discharge Placement                       Discharge Plan and Services In-house Referral: NA Discharge Planning Services: CM Consult Post Acute Care Choice: Home Health          DME Arranged: N/A DME Agency: NA       HH Arranged: PT HH Agency: [facility] Date [place] Agency Contacted: 02/21/19 Time [place]: 1214 Representative spoke with at [place]: Glyn Ade  Social Determinants of Health (SDOH) Interventions     Readmission Risk Interventions No flowsheet data found.

## 2019-02-21 NOTE — Progress Notes (Signed)
Subjective: The patient is alert and pleasant.  She looks well.  She has not ambulated much yet.  I spoke with her husband.  Objective: Vital signs in last 24 hours: Temp:  [97.3 F (36.3 C)-98.7 F (37.1 C)] 98.4 F (36.9 C) (05/07 0754) Pulse Rate:  [70-101] 84 (05/07 0754) Resp:  [12-20] 16 (05/07 0754) BP: (103-164)/(62-96) 137/79 (05/07 0754) SpO2:  [79 %-100 %] 100 % (05/07 0754) Estimated body mass index is 27.3 kg/m as calculated from the following:   Height as of this encounter: 5\' 3"  (1.6 m).   Weight as of this encounter: 69.9 kg.   Intake/Output from previous day: 05/06 0701 - 05/07 0700 In: 6122 [P.O.:480; I.V.:1000; IV Piggyback:250] Out: 4497 [Urine:1015; Blood:200] Intake/Output this shift: No intake/output data recorded.  Physical exam the patient is alert and oriented.  She is moving her lower extremities well.  Lab Results: Recent Labs    02/20/19 1127 02/21/19 0428  WBC  --  14.0*  HGB 10.2* 9.9*  HCT 30.0* 31.6*  PLT  --  307   BMET Recent Labs    02/20/19 1127 02/21/19 0428  NA 138 137  K 4.4 4.7  CL  --  106  CO2  --  19*  GLUCOSE 102* 149*  BUN  --  38*  CREATININE  --  2.32*  CALCIUM  --  8.1*    Studies/Results: Dg Lumbar Spine 2-3 Views  Result Date: 02/20/2019 CLINICAL DATA:  L4-5 PLIF EXAM: LUMBAR SPINE - 2-3 VIEW; DG C-ARM 61-120 MIN COMPARISON:  None. FINDINGS: Two C-arm images show posterior decompression, diskectomy and fusion at L4-5. Components appear well positioned. Posterior rods are not yet positioned. IMPRESSION: L4-5 PLIF. Electronically Signed   By: Nelson Chimes M.D.   On: 02/20/2019 13:36   Dg Lumbar Spine 1 View  Result Date: 02/20/2019 CLINICAL DATA:  L4-5 PLIF EXAM: LUMBAR SPINE - 1 VIEW COMPARISON:  11/12/2018 FINDINGS: L4-5 advanced disc degeneration with anterolisthesis. Spinal numbering is based on preoperative MRI. A probe projects at the L4-5 interspinous space. IMPRESSION: Intraoperative localization at  L4-5. Electronically Signed   By: Monte Fantasia M.D.   On: 02/20/2019 11:20   Dg C-arm 1-60 Min  Result Date: 02/20/2019 CLINICAL DATA:  L4-5 PLIF EXAM: LUMBAR SPINE - 2-3 VIEW; DG C-ARM 61-120 MIN COMPARISON:  None. FINDINGS: Two C-arm images show posterior decompression, diskectomy and fusion at L4-5. Components appear well positioned. Posterior rods are not yet positioned. IMPRESSION: L4-5 PLIF. Electronically Signed   By: Nelson Chimes M.D.   On: 02/20/2019 13:36    Assessment/Plan: Postop day #1: The patient is doing well.  She may go home later on today depending on how she does with PT.  I gave her discharge instructions and answered all her questions.  LOS: 1 day     Ophelia Charter 02/21/2019, 8:01 AM

## 2019-02-21 NOTE — Progress Notes (Signed)
Transported to lobby per wc in no acute distress with [facility] RN staff. Dc'd to care of husband at pickup. [place] equip transported with pt, dc instructions given to pt.

## 2019-02-22 LAB — BPAM RBC
Blood Product Expiration Date: 202005152359
Blood Product Expiration Date: 202005152359
Unit Type and Rh: 5100
Unit Type and Rh: 5100

## 2019-02-22 LAB — TYPE AND SCREEN
ABO/RH(D): O POS
Antibody Screen: POSITIVE
Unit division: 0
Unit division: 0

## 2019-02-22 MED FILL — Heparin Sodium (Porcine) Inj 1000 Unit/ML: INTRAMUSCULAR | Qty: 30 | Status: AC

## 2019-02-22 MED FILL — Sodium Chloride IV Soln 0.9%: INTRAVENOUS | Qty: 1000 | Status: AC

## 2019-02-23 DIAGNOSIS — M5416 Radiculopathy, lumbar region: Secondary | ICD-10-CM | POA: Diagnosis not present

## 2019-02-23 DIAGNOSIS — J45909 Unspecified asthma, uncomplicated: Secondary | ICD-10-CM | POA: Diagnosis not present

## 2019-02-23 DIAGNOSIS — E039 Hypothyroidism, unspecified: Secondary | ICD-10-CM | POA: Diagnosis not present

## 2019-02-23 DIAGNOSIS — K219 Gastro-esophageal reflux disease without esophagitis: Secondary | ICD-10-CM | POA: Diagnosis not present

## 2019-02-23 DIAGNOSIS — N184 Chronic kidney disease, stage 4 (severe): Secondary | ICD-10-CM | POA: Diagnosis not present

## 2019-02-23 DIAGNOSIS — M069 Rheumatoid arthritis, unspecified: Secondary | ICD-10-CM | POA: Diagnosis not present

## 2019-02-23 DIAGNOSIS — Z7982 Long term (current) use of aspirin: Secondary | ICD-10-CM | POA: Diagnosis not present

## 2019-02-23 DIAGNOSIS — R32 Unspecified urinary incontinence: Secondary | ICD-10-CM | POA: Diagnosis not present

## 2019-02-23 DIAGNOSIS — I129 Hypertensive chronic kidney disease with stage 1 through stage 4 chronic kidney disease, or unspecified chronic kidney disease: Secondary | ICD-10-CM | POA: Diagnosis not present

## 2019-02-23 DIAGNOSIS — Z9181 History of falling: Secondary | ICD-10-CM | POA: Diagnosis not present

## 2019-02-23 DIAGNOSIS — M48061 Spinal stenosis, lumbar region without neurogenic claudication: Secondary | ICD-10-CM | POA: Diagnosis not present

## 2019-02-23 DIAGNOSIS — I872 Venous insufficiency (chronic) (peripheral): Secondary | ICD-10-CM | POA: Diagnosis not present

## 2019-02-23 DIAGNOSIS — F329 Major depressive disorder, single episode, unspecified: Secondary | ICD-10-CM | POA: Diagnosis not present

## 2019-02-23 DIAGNOSIS — Z4789 Encounter for other orthopedic aftercare: Secondary | ICD-10-CM | POA: Diagnosis not present

## 2019-02-23 DIAGNOSIS — M5126 Other intervertebral disc displacement, lumbar region: Secondary | ICD-10-CM | POA: Diagnosis not present

## 2019-02-23 DIAGNOSIS — Z8701 Personal history of pneumonia (recurrent): Secondary | ICD-10-CM | POA: Diagnosis not present

## 2019-02-23 DIAGNOSIS — Z8673 Personal history of transient ischemic attack (TIA), and cerebral infarction without residual deficits: Secondary | ICD-10-CM | POA: Diagnosis not present

## 2019-02-23 DIAGNOSIS — F419 Anxiety disorder, unspecified: Secondary | ICD-10-CM | POA: Diagnosis not present

## 2019-02-23 DIAGNOSIS — Z7952 Long term (current) use of systemic steroids: Secondary | ICD-10-CM | POA: Diagnosis not present

## 2019-02-25 DIAGNOSIS — G4700 Insomnia, unspecified: Secondary | ICD-10-CM | POA: Diagnosis not present

## 2019-02-25 DIAGNOSIS — M7061 Trochanteric bursitis, right hip: Secondary | ICD-10-CM | POA: Diagnosis not present

## 2019-02-25 DIAGNOSIS — Z0000 Encounter for general adult medical examination without abnormal findings: Secondary | ICD-10-CM | POA: Diagnosis not present

## 2019-02-25 DIAGNOSIS — Z1339 Encounter for screening examination for other mental health and behavioral disorders: Secondary | ICD-10-CM | POA: Diagnosis not present

## 2019-02-25 DIAGNOSIS — N184 Chronic kidney disease, stage 4 (severe): Secondary | ICD-10-CM | POA: Diagnosis not present

## 2019-02-25 DIAGNOSIS — I10 Essential (primary) hypertension: Secondary | ICD-10-CM | POA: Diagnosis not present

## 2019-02-25 DIAGNOSIS — G9529 Other cord compression: Secondary | ICD-10-CM | POA: Diagnosis not present

## 2019-02-25 DIAGNOSIS — G8929 Other chronic pain: Secondary | ICD-10-CM | POA: Diagnosis not present

## 2019-02-25 DIAGNOSIS — I872 Venous insufficiency (chronic) (peripheral): Secondary | ICD-10-CM | POA: Diagnosis not present

## 2019-02-25 DIAGNOSIS — G250 Essential tremor: Secondary | ICD-10-CM | POA: Diagnosis not present

## 2019-02-25 DIAGNOSIS — G459 Transient cerebral ischemic attack, unspecified: Secondary | ICD-10-CM | POA: Diagnosis not present

## 2019-02-25 DIAGNOSIS — E039 Hypothyroidism, unspecified: Secondary | ICD-10-CM | POA: Diagnosis not present

## 2019-02-25 DIAGNOSIS — Z1331 Encounter for screening for depression: Secondary | ICD-10-CM | POA: Diagnosis not present

## 2019-02-25 DIAGNOSIS — I129 Hypertensive chronic kidney disease with stage 1 through stage 4 chronic kidney disease, or unspecified chronic kidney disease: Secondary | ICD-10-CM | POA: Diagnosis not present

## 2019-02-27 DIAGNOSIS — M48061 Spinal stenosis, lumbar region without neurogenic claudication: Secondary | ICD-10-CM | POA: Diagnosis not present

## 2019-02-27 DIAGNOSIS — Z4789 Encounter for other orthopedic aftercare: Secondary | ICD-10-CM | POA: Diagnosis not present

## 2019-02-27 DIAGNOSIS — N184 Chronic kidney disease, stage 4 (severe): Secondary | ICD-10-CM | POA: Diagnosis not present

## 2019-02-27 DIAGNOSIS — M5126 Other intervertebral disc displacement, lumbar region: Secondary | ICD-10-CM | POA: Diagnosis not present

## 2019-02-27 DIAGNOSIS — J45909 Unspecified asthma, uncomplicated: Secondary | ICD-10-CM | POA: Diagnosis not present

## 2019-02-27 DIAGNOSIS — I129 Hypertensive chronic kidney disease with stage 1 through stage 4 chronic kidney disease, or unspecified chronic kidney disease: Secondary | ICD-10-CM | POA: Diagnosis not present

## 2019-02-28 DIAGNOSIS — N184 Chronic kidney disease, stage 4 (severe): Secondary | ICD-10-CM | POA: Diagnosis not present

## 2019-02-28 DIAGNOSIS — Z4789 Encounter for other orthopedic aftercare: Secondary | ICD-10-CM | POA: Diagnosis not present

## 2019-02-28 DIAGNOSIS — I129 Hypertensive chronic kidney disease with stage 1 through stage 4 chronic kidney disease, or unspecified chronic kidney disease: Secondary | ICD-10-CM | POA: Diagnosis not present

## 2019-02-28 DIAGNOSIS — M48061 Spinal stenosis, lumbar region without neurogenic claudication: Secondary | ICD-10-CM | POA: Diagnosis not present

## 2019-02-28 DIAGNOSIS — M5126 Other intervertebral disc displacement, lumbar region: Secondary | ICD-10-CM | POA: Diagnosis not present

## 2019-02-28 DIAGNOSIS — J45909 Unspecified asthma, uncomplicated: Secondary | ICD-10-CM | POA: Diagnosis not present

## 2019-03-06 DIAGNOSIS — M5126 Other intervertebral disc displacement, lumbar region: Secondary | ICD-10-CM | POA: Diagnosis not present

## 2019-03-06 DIAGNOSIS — J45909 Unspecified asthma, uncomplicated: Secondary | ICD-10-CM | POA: Diagnosis not present

## 2019-03-06 DIAGNOSIS — I129 Hypertensive chronic kidney disease with stage 1 through stage 4 chronic kidney disease, or unspecified chronic kidney disease: Secondary | ICD-10-CM | POA: Diagnosis not present

## 2019-03-06 DIAGNOSIS — M48061 Spinal stenosis, lumbar region without neurogenic claudication: Secondary | ICD-10-CM | POA: Diagnosis not present

## 2019-03-06 DIAGNOSIS — Z4789 Encounter for other orthopedic aftercare: Secondary | ICD-10-CM | POA: Diagnosis not present

## 2019-03-06 DIAGNOSIS — N184 Chronic kidney disease, stage 4 (severe): Secondary | ICD-10-CM | POA: Diagnosis not present

## 2019-03-08 DIAGNOSIS — I129 Hypertensive chronic kidney disease with stage 1 through stage 4 chronic kidney disease, or unspecified chronic kidney disease: Secondary | ICD-10-CM | POA: Diagnosis not present

## 2019-03-08 DIAGNOSIS — N184 Chronic kidney disease, stage 4 (severe): Secondary | ICD-10-CM | POA: Diagnosis not present

## 2019-03-08 DIAGNOSIS — M48061 Spinal stenosis, lumbar region without neurogenic claudication: Secondary | ICD-10-CM | POA: Diagnosis not present

## 2019-03-08 DIAGNOSIS — J45909 Unspecified asthma, uncomplicated: Secondary | ICD-10-CM | POA: Diagnosis not present

## 2019-03-08 DIAGNOSIS — M5126 Other intervertebral disc displacement, lumbar region: Secondary | ICD-10-CM | POA: Diagnosis not present

## 2019-03-08 DIAGNOSIS — Z4789 Encounter for other orthopedic aftercare: Secondary | ICD-10-CM | POA: Diagnosis not present

## 2019-03-18 ENCOUNTER — Other Ambulatory Visit: Payer: Self-pay

## 2019-03-18 ENCOUNTER — Ambulatory Visit (HOSPITAL_COMMUNITY)
Admission: RE | Admit: 2019-03-18 | Discharge: 2019-03-18 | Disposition: A | Discharge status: Home / Self Care | Payer: Medicare Other | Source: Ambulatory Visit | Attending: Family | Admitting: Family

## 2019-03-18 ENCOUNTER — Other Ambulatory Visit (HOSPITAL_COMMUNITY): Payer: Self-pay | Admitting: Internal Medicine

## 2019-03-18 DIAGNOSIS — R600 Localized edema: Secondary | ICD-10-CM | POA: Insufficient documentation

## 2019-03-18 DIAGNOSIS — J189 Pneumonia, unspecified organism: Secondary | ICD-10-CM | POA: Diagnosis not present

## 2019-03-18 DIAGNOSIS — I129 Hypertensive chronic kidney disease with stage 1 through stage 4 chronic kidney disease, or unspecified chronic kidney disease: Secondary | ICD-10-CM | POA: Diagnosis not present

## 2019-03-18 DIAGNOSIS — N184 Chronic kidney disease, stage 4 (severe): Secondary | ICD-10-CM | POA: Diagnosis not present

## 2019-03-18 DIAGNOSIS — R05 Cough: Secondary | ICD-10-CM | POA: Diagnosis not present

## 2019-03-18 DIAGNOSIS — Z20818 Contact with and (suspected) exposure to other bacterial communicable diseases: Secondary | ICD-10-CM | POA: Diagnosis not present

## 2019-03-18 DIAGNOSIS — R5383 Other fatigue: Secondary | ICD-10-CM | POA: Diagnosis not present

## 2019-03-26 ENCOUNTER — Emergency Department (HOSPITAL_COMMUNITY): Payer: Medicare Other

## 2019-03-26 ENCOUNTER — Other Ambulatory Visit: Payer: Self-pay

## 2019-03-26 ENCOUNTER — Encounter (HOSPITAL_COMMUNITY): Payer: Self-pay | Admitting: Emergency Medicine

## 2019-03-26 ENCOUNTER — Emergency Department (HOSPITAL_COMMUNITY)
Admission: EM | Admit: 2019-03-26 | Discharge: 2019-03-26 | Disposition: A | Discharge status: Home / Self Care | Payer: Medicare Other | Attending: Emergency Medicine | Admitting: Emergency Medicine

## 2019-03-26 DIAGNOSIS — R4781 Slurred speech: Secondary | ICD-10-CM | POA: Diagnosis not present

## 2019-03-26 DIAGNOSIS — N184 Chronic kidney disease, stage 4 (severe): Secondary | ICD-10-CM | POA: Insufficient documentation

## 2019-03-26 DIAGNOSIS — Z79899 Other long term (current) drug therapy: Secondary | ICD-10-CM | POA: Diagnosis not present

## 2019-03-26 DIAGNOSIS — N179 Acute kidney failure, unspecified: Secondary | ICD-10-CM | POA: Diagnosis not present

## 2019-03-26 DIAGNOSIS — I129 Hypertensive chronic kidney disease with stage 1 through stage 4 chronic kidney disease, or unspecified chronic kidney disease: Secondary | ICD-10-CM | POA: Insufficient documentation

## 2019-03-26 DIAGNOSIS — E039 Hypothyroidism, unspecified: Secondary | ICD-10-CM | POA: Diagnosis not present

## 2019-03-26 DIAGNOSIS — R270 Ataxia, unspecified: Secondary | ICD-10-CM | POA: Diagnosis not present

## 2019-03-26 DIAGNOSIS — Z20828 Contact with and (suspected) exposure to other viral communicable diseases: Secondary | ICD-10-CM | POA: Diagnosis not present

## 2019-03-26 DIAGNOSIS — Z7982 Long term (current) use of aspirin: Secondary | ICD-10-CM | POA: Diagnosis not present

## 2019-03-26 DIAGNOSIS — R29818 Other symptoms and signs involving the nervous system: Secondary | ICD-10-CM | POA: Diagnosis not present

## 2019-03-26 DIAGNOSIS — N3000 Acute cystitis without hematuria: Secondary | ICD-10-CM

## 2019-03-26 DIAGNOSIS — G9389 Other specified disorders of brain: Secondary | ICD-10-CM | POA: Diagnosis not present

## 2019-03-26 DIAGNOSIS — R51 Headache: Secondary | ICD-10-CM | POA: Diagnosis not present

## 2019-03-26 DIAGNOSIS — R2689 Other abnormalities of gait and mobility: Secondary | ICD-10-CM | POA: Diagnosis not present

## 2019-03-26 DIAGNOSIS — R4701 Aphasia: Secondary | ICD-10-CM | POA: Diagnosis present

## 2019-03-26 DIAGNOSIS — I10 Essential (primary) hypertension: Secondary | ICD-10-CM | POA: Diagnosis not present

## 2019-03-26 LAB — DIFFERENTIAL
Abs Immature Granulocytes: 0.07 10*3/uL (ref 0.00–0.07)
Basophils Absolute: 0.1 10*3/uL (ref 0.0–0.1)
Basophils Relative: 0 %
Eosinophils Absolute: 0.1 10*3/uL (ref 0.0–0.5)
Eosinophils Relative: 1 %
Immature Granulocytes: 1 %
Lymphocytes Relative: 17 %
Lymphs Abs: 2 10*3/uL (ref 0.7–4.0)
Monocytes Absolute: 0.8 10*3/uL (ref 0.1–1.0)
Monocytes Relative: 7 %
Neutro Abs: 9.2 10*3/uL — ABNORMAL HIGH (ref 1.7–7.7)
Neutrophils Relative %: 74 %

## 2019-03-26 LAB — COMPREHENSIVE METABOLIC PANEL
ALT: <5 U/L (ref 0–44)
AST: 27 U/L (ref 15–41)
Albumin: 3.2 g/dL — ABNORMAL LOW (ref 3.5–5.0)
Alkaline Phosphatase: 62 U/L (ref 38–126)
Anion gap: 11 (ref 5–15)
BUN: 41 mg/dL — ABNORMAL HIGH (ref 8–23)
CO2: 22 mmol/L (ref 22–32)
Calcium: 9 mg/dL (ref 8.9–10.3)
Chloride: 106 mmol/L (ref 98–111)
Creatinine, Ser: 2.66 mg/dL — ABNORMAL HIGH (ref 0.44–1.00)
GFR calc Af Amer: 20 mL/min — ABNORMAL LOW (ref >60)
GFR calc non Af Amer: 17 mL/min — ABNORMAL LOW (ref >60)
Glucose, Bld: 98 mg/dL (ref 70–99)
Potassium: 5.3 mmol/L — ABNORMAL HIGH (ref 3.5–5.1)
Sodium: 139 mmol/L (ref 135–145)
Total Bilirubin: 0.9 mg/dL (ref 0.3–1.2)
Total Protein: 5.9 g/dL — ABNORMAL LOW (ref 6.5–8.1)

## 2019-03-26 LAB — I-STAT CHEM 8, ED
BUN: 53 mg/dL — ABNORMAL HIGH (ref 8–23)
Calcium, Ion: 1.06 mmol/L — ABNORMAL LOW (ref 1.15–1.40)
Chloride: 106 mmol/L (ref 98–111)
Creatinine, Ser: 2.8 mg/dL — ABNORMAL HIGH (ref 0.44–1.00)
Glucose, Bld: 97 mg/dL (ref 70–99)
HCT: 34 % — ABNORMAL LOW (ref 36.0–46.0)
Hemoglobin: 11.6 g/dL — ABNORMAL LOW (ref 12.0–15.0)
Potassium: 5.2 mmol/L — ABNORMAL HIGH (ref 3.5–5.1)
Sodium: 135 mmol/L (ref 135–145)
TCO2: 26 mmol/L (ref 22–32)

## 2019-03-26 LAB — URINALYSIS, ROUTINE W REFLEX MICROSCOPIC
Bilirubin Urine: NEGATIVE
Glucose, UA: NEGATIVE mg/dL
Hgb urine dipstick: NEGATIVE
Ketones, ur: NEGATIVE mg/dL
Nitrite: NEGATIVE
Protein, ur: NEGATIVE mg/dL
Specific Gravity, Urine: 1.009 (ref 1.005–1.030)
pH: 7 (ref 5.0–8.0)

## 2019-03-26 LAB — CBC
HCT: 35.3 % — ABNORMAL LOW (ref 36.0–46.0)
Hemoglobin: 10.8 g/dL — ABNORMAL LOW (ref 12.0–15.0)
MCH: 30.5 pg (ref 26.0–34.0)
MCHC: 30.6 g/dL (ref 30.0–36.0)
MCV: 99.7 fL (ref 80.0–100.0)
Platelets: 407 10*3/uL — ABNORMAL HIGH (ref 150–400)
RBC: 3.54 MIL/uL — ABNORMAL LOW (ref 3.87–5.11)
RDW: 13.8 % (ref 11.5–15.5)
WBC: 12.2 10*3/uL — ABNORMAL HIGH (ref 4.0–10.5)
nRBC: 0 % (ref 0.0–0.2)

## 2019-03-26 LAB — RAPID URINE DRUG SCREEN, HOSP PERFORMED
Amphetamines: NOT DETECTED
Barbiturates: NOT DETECTED
Benzodiazepines: NOT DETECTED
Cocaine: NOT DETECTED
Opiates: POSITIVE — AB
Tetrahydrocannabinol: NOT DETECTED

## 2019-03-26 LAB — CBG MONITORING, ED: Glucose-Capillary: 97 mg/dL (ref 70–99)

## 2019-03-26 LAB — ETHANOL: Alcohol, Ethyl (B): <10 mg/dL (ref <10)

## 2019-03-26 LAB — PROTIME-INR
INR: 1.1 (ref 0.8–1.2)
Prothrombin Time: 14.4 seconds (ref 11.4–15.2)

## 2019-03-26 LAB — SARS CORONAVIRUS 2: SARS Coronavirus 2: NOT DETECTED

## 2019-03-26 LAB — APTT: aPTT: 30 seconds (ref 24–36)

## 2019-03-26 MED ORDER — SODIUM CHLORIDE 0.9 % IV BOLUS
500.0000 mL | Freq: Once | INTRAVENOUS | Status: AC
  Administered 2019-03-26: 500 mL via INTRAVENOUS

## 2019-03-26 MED ORDER — CIPROFLOXACIN HCL 500 MG PO TABS: ORAL_TABLET | ORAL | 0 refills | Status: DC

## 2019-03-26 NOTE — ED Notes (Signed)
Carolyn Robertson- husband contacted and update given.  1st number- [phone]

## 2019-03-26 NOTE — ED Triage Notes (Signed)
Patient states she woke up this morning not feeling very "perky." states her balance was off and her speech was off. Husband states he noticed speech problems yesterday. Patient states she was trying to call her doctor around 1 today but was unable to actually do it. Patient denies any pain but feels off and incoherent.

## 2019-03-26 NOTE — ED Notes (Signed)
Husband will be here in 10 mins

## 2019-03-26 NOTE — ED Notes (Signed)
Purewick placed, pt aware we need urine sample

## 2019-03-26 NOTE — ED Notes (Signed)
Discharge instructions discussed with pt. Pt verbalized understanding. Pt stable and ambulatory. No signature pad available. 

## 2019-03-26 NOTE — ED Notes (Signed)
Pt transported to MRI 

## 2019-03-26 NOTE — ED Provider Notes (Signed)
Case discussed with Dr Drenda Freeze, neurology.  Recommends MRI for further evaluation.  Pt may be able to managed outpatient if negative for acute CVA.  Patient's MRI is negative for acute stroke.  Patient's laboratory tests are notable for an acute kidney injury.  Her urinalysis also suggest a urinary tract infection.  Patient was hydrated in the emergency room.  She was monitored for several hours.  She is feeling better now after treatment.  She has been able to eat and drink.  She is ambulating around the emergency department.  We will have her hold her Micardis as this could be contributing to her acute chronic renal insufficiency.  I will give her prescription for Cipro to treat for the urinary tract infection considering her allergies.  I discussed this plan with the patient as well as her daughter.  They are comfortable with discharge.   Dorie Rank, MD 03/26/19 2101

## 2019-03-26 NOTE — ED Notes (Signed)
Pt ambulating well w/ assistance. Pt typically uses walker at home.

## 2019-03-26 NOTE — Discharge Instructions (Addendum)
Hold your micardis medication.  It could be contributing to the increase in your creatinine.  Take the abx as prescribed.  Follow up with your doctor to have your blood tests rechecked

## 2019-03-26 NOTE — ED Provider Notes (Signed)
[place] EMERGENCY DEPARTMENT Provider Note   CSN: [id number] Arrival date & time: 03/26/19  1405    History   Chief Complaint Chief Complaint  Patient presents with  . Aphasia    HPI Carolyn Robertson is a 73 y.o. female.     HPI  73 year old female presents with hoarse voice/trouble speaking, dizziness, and poor coordination.  She states that her husband noticed some change in her speech yesterday.  I tried to call the husband on the number listed but he did not answer.  The patient states that when she first woke up around 8 AM when her husband awoke her, she felt a little lightheaded and she noticed her voice seemed different.  She does not have any sore throat and is recently getting over pneumonia.  However she states that her voice sounds hoarse.  She also feels lightheaded/dizzy.  She was try to go to the beauty salon at around 1 PM and when she was trying to dial on the phone to call her doctor because she did not feel right, she felt like she could not use the phone.  Her arms were working but it felt like her brain was not working correctly.  She has a little bit of a headache that started about 30 minutes ago.  No chest pain.  Past Medical History:  Diagnosis Date  . Anxiety   . Arthritis    RA  . 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 Depression   . GERD (gastroesophageal reflux disease)   . History of blood transfusion    after miscarrige  . History of kidney stones    x 2  . Hypertension   . Hypothyroidism   . Incontinent of urine    incontinent at night  . Memory loss    "after TIA"  . Pneumonia    as a child and teenager  . Renal disorder    stage IV kidney disease  . Thyroid disease   . TIA (transient ischemic attack) 06/2018    Patient Active Problem List   Diagnosis Date Noted  . Spondylolisthesis, lumbar region 02/20/2019  . TIA (transient ischemic attack) 06/25/2018  . CVA (cerebrovascular accident)  ([place]) 06/24/2018    Class: Acute  . Lumbar herniated disc 04/12/2018  . Stage 4 chronic kidney disease ([place]) 02/19/2018  . Chronic venous insufficiency 02/19/2018  . Primary localized osteoarthritis of right knee 11/14/2016    Past Surgical History:  Procedure Laterality Date  . CHOLECYSTECTOMY    . DILATION AND CURETTAGE OF UTERUS    . EYE SURGERY Right    retina surgery  . KNEE SURGERY     bilat knees  . LUMBAR LAMINECTOMY/DECOMPRESSION MICRODISCECTOMY Right 04/12/2018   Procedure: MICRODISCECTOMY LUMBAR FOUR- LUMBAR FIVE;  Surgeon: Newman Pies, MD;  Location: [place];  Service: Neurosurgery;  Laterality: Right;  MICRODISCECTOMY LUMBAR FOUR- LUMBAR FIVE  . TONSILLECTOMY       OB History   No obstetric history on file.      Home Medications    Prior to Admission medications   Medication Sig Start Date End Date Taking? Authorizing Provider  aspirin 81 MG EC tablet Take 1 tablet (81 mg total) by mouth daily. 06/27/18   Kinnie Feil, MD  baclofen (LIORESAL) 10 MG tablet Take 10 mg by mouth at bedtime as needed for muscle spasms.     [provider]  cholecalciferol (VITAMIN D3) 25 MCG (1000 UT)  tablet Take 1,000 Units by mouth daily.    [provider]  cyanocobalamin (,VITAMIN B-12,) 1000 MCG/ML injection Inject 1,000 mcg into the muscle every 30 (thirty) days.    [provider]  cyclobenzaprine (FLEXERIL) 10 MG tablet Take 1 tablet (10 mg total) by mouth 3 (three) times daily as needed for muscle spasms. 02/21/19   Viona Gilmore D, NP  docusate sodium (COLACE) 100 MG capsule Take 1 capsule (100 mg total) by mouth 2 (two) times daily. 02/21/19   Viona Gilmore D, NP  DULoxetine (CYMBALTA) 60 MG capsule Take 60 mg by mouth at bedtime.    [provider]  estrogen, conjugated,-medroxyprogesterone (PREMPRO) 0.625-2.5 MG per tablet Take 1 tablet by mouth daily.    [provider]  Febuxostat 80 MG TABS Take 40 mg by mouth at  bedtime.     [provider]  furosemide (LASIX) 40 MG tablet Take 40 mg by mouth daily as needed for edema.     [provider]  HYDROcodone-acetaminophen (NORCO) 10-325 MG tablet Take 1 tablet by mouth every 4 (four) hours as needed for moderate pain. 02/21/19   Viona Gilmore D, NP  oxybutynin (DITROPAN) 5 MG tablet Take 5 mg by mouth daily.    [provider]  pantoprazole (PROTONIX) 40 MG tablet Take 40 mg by mouth daily before breakfast. 02/22/18   [provider]  predniSONE (DELTASONE) 5 MG tablet Take 10 mg by mouth daily with breakfast.    [provider]  SYNTHROID 150 MCG tablet Take 150 mcg by mouth daily before breakfast. 01/05/18   [provider]  telmisartan (MICARDIS) 40 MG tablet Take 40 mg by mouth at bedtime.  03/06/18   [provider]    Family History No family history on file.  Social History Social History   Tobacco Use  . Smoking status: Never Smoker  . Smokeless tobacco: Never Used  Substance Use Topics  . Alcohol use: No    Comment: rarely  . Drug use: No     Allergies   Other; Penicillins; Sulfa antibiotics; Codeine; Hydrocodone-acetaminophen; Naproxen sodium; and Tape   Review of Systems Review of Systems  Constitutional: Negative for fever.  Eyes: Positive for visual disturbance (chronic blurry vision for over 1 month).  Respiratory: Negative for cough and shortness of breath.   Cardiovascular: Negative for chest pain.  Gastrointestinal: Negative for abdominal pain and vomiting.  Neurological: Positive for dizziness, speech difficulty and headaches. Negative for weakness and numbness.  All other systems reviewed and are negative.    Physical Exam Updated Vital Signs BP (!) 144/84 (BP Location: Right Arm)   Pulse 82   Temp 98.1 F (36.7 C) (Oral)   Resp 15   LMP  (LMP Unknown)   SpO2 98%   Physical Exam Vitals signs and nursing note reviewed.  Constitutional:      General:  She is not in acute distress.    Appearance: She is well-developed. She is not ill-appearing or diaphoretic.  HENT:     Head: Normocephalic and atraumatic.     Comments: No obvious oropharyngeal swelling or abnormality    Right Ear: External ear normal.     Left Ear: External ear normal.     Nose: Nose normal.     Mouth/Throat:     Pharynx: No oropharyngeal exudate or posterior oropharyngeal erythema.  Eyes:     General:        Right eye: No discharge.  Left eye: No discharge.     Extraocular Movements: Extraocular movements intact.     Pupils: Pupils are equal, round, and reactive to light.     Comments: Grossly normal visual fields  Cardiovascular:     Rate and Rhythm: Normal rate and regular rhythm.     Heart sounds: Normal heart sounds.  Pulmonary:     Effort: Pulmonary effort is normal.     Breath sounds: Normal breath sounds.  Abdominal:     Palpations: Abdomen is soft.     Tenderness: There is no abdominal tenderness.  Skin:    General: Skin is warm and dry.  Neurological:     Mental Status: She is alert and oriented to person, place, and time.     Comments: CN 3-12 grossly intact. 5/5 strength in all 4 extremities. Grossly normal sensation. Normal finger to nose. Normal ambulation  Psychiatric:        Mood and Affect: Mood is not anxious.      ED Treatments / Results  Labs (all labs ordered are listed, but only abnormal results are displayed) Labs Reviewed  CBC - Abnormal; Notable for the following components:      Result Value   WBC 12.2 (*)    RBC 3.54 (*)    Hemoglobin 10.8 (*)    HCT 35.3 (*)    Platelets 407 (*)    All other components within normal limits  DIFFERENTIAL - Abnormal; Notable for the following components:   Neutro Abs 9.2 (*)    All other components within normal limits  I-STAT CHEM 8, ED - Abnormal; Notable for the following components:   Potassium 5.2 (*)    BUN 53 (*)    Creatinine, Ser 2.80 (*)    Calcium, Ion 1.06 (*)     Hemoglobin 11.6 (*)    HCT 34.0 (*)    All other components within normal limits  ETHANOL  PROTIME-INR  APTT  COMPREHENSIVE METABOLIC PANEL  RAPID URINE DRUG SCREEN, HOSP PERFORMED  URINALYSIS, ROUTINE W REFLEX MICROSCOPIC  CBG MONITORING, ED    EKG EKG Interpretation  Date/Time:  Tuesday March 26 2019 14:17:44 EDT Ventricular Rate:  85 PR Interval:    QRS Duration: 90 QT Interval:  417 QTC Calculation: 496 R Axis:   11 Text Interpretation:  Sinus rhythm Low voltage, precordial leads Abnormal R-wave progression, early transition LVH by voltage Borderline prolonged QT interval Artifact similar to Sept 2019 Confirmed by Sherwood Gambler [phone]) on 03/26/2019 2:43:58 PM   Radiology Ct Head Wo Contrast  Result Date: 03/26/2019 CLINICAL DATA:  Dysarthria and gait disturbance. EXAM: CT HEAD WITHOUT CONTRAST TECHNIQUE: Contiguous axial images were obtained from the base of the skull through the vertex without intravenous contrast. COMPARISON:  Head CT June 24, 2018 and brain MRI June 25, 2018 FINDINGS: Brain: Mild to moderate diffuse atrophy is stable. There is no intracranial mass, hemorrhage, extra-axial fluid collection, or midline shift. There is small vessel disease throughout the centra semiovale bilaterally. Small vessel disease is noted in each internal and external capsule anteriorly, most notably in the anterior limb of the right internal capsule. There is evidence of a prior small infarct in the mid right cerebellar hemisphere. There is decreased attenuation in the lateral aspect of the right dentate nucleus in the right cerebellum, a finding concerning for recent and possibly acute focal infarct in this area. No other findings suggesting acute infarct evident. Vascular: No hyperdense vessels are evident. There is calcification in each carotid  siphon region. Skull: Bony calvarium appears intact. Sinuses/Orbits: There is opacification in the posterior sphenoid sinuses with an  air-fluid level in the posterior left sphenoid sinus. There is mucosal thickening in several ethmoid air cells. Other paranasal sinuses are clear. Orbits appear symmetric bilaterally. Other: Mastoid air cells are clear. IMPRESSION: Decreased attenuation in the lateral aspect of the dentate nucleus of the right cerebellum. Concern for recent and potentially acute small infarct in this area. There is a nearby older infarct slightly posterior to the right dentate nucleus in the mid right cerebellum. Elsewhere there is stable mild-to-moderate atrophy with extensive small vessel disease throughout the centra semiovale bilaterally and anterior limbs of the internal and external capsules bilaterally. No acute appearing supratentorial infarct is demonstrated on this study. No mass or hemorrhage. There are foci of arterial vascular calcification. There are foci of paranasal sinus disease. These results were called by telephone at the time of interpretation on 03/26/2019 at 3:02 pm to Dr. Sherwood Gambler , who verbally acknowledged these results. Electronically Signed   By: Lowella Grip III M.D.   On: 03/26/2019 15:02    Procedures Procedures (including critical care time)  Medications Ordered in ED Medications  sodium chloride 0.9 % bolus 500 mL (500 mLs Intravenous New Bag/Given 03/26/19 1505)     Initial Impression / Assessment and Plan / ED Course  I have reviewed the triage vital signs and the nursing notes.  Pertinent labs & imaging results that were available during my care of the patient were reviewed by me and considered in my medical decision making (see chart for details).  Clinical Course as of Mar 25 1512  Tue Mar 26, 2019  1433 Patient's last low normal was yesterday.  She states when she first awoke she had some of these symptoms.  I tried to call the husband but he did not answer.  Either way, this does not appear to be a code stroke but instead will work-up for stroke/TIA.   [SG]     Clinical Course User Index [SG] Sherwood Gambler, MD       Patient's CT with concern for infarct. Presentation not c/w code stroke. Will need neuro consult and admission. Care to Dr. Tomi Bamberger.  Final Clinical Impressions(s) / ED Diagnoses   Final diagnoses:  None    ED Discharge Orders    None       Sherwood Gambler, MD 03/26/19 1513

## 2019-03-27 DIAGNOSIS — R410 Disorientation, unspecified: Secondary | ICD-10-CM | POA: Diagnosis not present

## 2019-03-27 DIAGNOSIS — N390 Urinary tract infection, site not specified: Secondary | ICD-10-CM | POA: Diagnosis not present

## 2019-03-27 DIAGNOSIS — N184 Chronic kidney disease, stage 4 (severe): Secondary | ICD-10-CM | POA: Diagnosis not present

## 2019-03-27 DIAGNOSIS — I129 Hypertensive chronic kidney disease with stage 1 through stage 4 chronic kidney disease, or unspecified chronic kidney disease: Secondary | ICD-10-CM | POA: Diagnosis not present

## 2019-03-29 DIAGNOSIS — R600 Localized edema: Secondary | ICD-10-CM | POA: Diagnosis not present

## 2019-03-29 DIAGNOSIS — N184 Chronic kidney disease, stage 4 (severe): Secondary | ICD-10-CM | POA: Diagnosis not present

## 2019-03-29 DIAGNOSIS — N390 Urinary tract infection, site not specified: Secondary | ICD-10-CM | POA: Diagnosis not present

## 2019-03-29 DIAGNOSIS — R410 Disorientation, unspecified: Secondary | ICD-10-CM | POA: Diagnosis not present

## 2019-03-29 DIAGNOSIS — I129 Hypertensive chronic kidney disease with stage 1 through stage 4 chronic kidney disease, or unspecified chronic kidney disease: Secondary | ICD-10-CM | POA: Diagnosis not present

## 2019-04-02 DIAGNOSIS — M170 Bilateral primary osteoarthritis of knee: Secondary | ICD-10-CM | POA: Diagnosis not present

## 2019-04-03 DIAGNOSIS — N390 Urinary tract infection, site not specified: Secondary | ICD-10-CM | POA: Diagnosis not present

## 2019-04-03 DIAGNOSIS — R410 Disorientation, unspecified: Secondary | ICD-10-CM | POA: Diagnosis not present

## 2019-04-12 ENCOUNTER — Emergency Department (HOSPITAL_COMMUNITY)
Admission: EM | Admit: 2019-04-12 | Discharge: 2019-04-12 | Disposition: A | Discharge status: Home / Self Care | Payer: Medicare Other | Attending: Emergency Medicine | Admitting: Emergency Medicine

## 2019-04-12 ENCOUNTER — Other Ambulatory Visit: Payer: Self-pay

## 2019-04-12 ENCOUNTER — Encounter (HOSPITAL_COMMUNITY): Payer: Self-pay

## 2019-04-12 DIAGNOSIS — E039 Hypothyroidism, unspecified: Secondary | ICD-10-CM | POA: Insufficient documentation

## 2019-04-12 DIAGNOSIS — Z7982 Long term (current) use of aspirin: Secondary | ICD-10-CM | POA: Insufficient documentation

## 2019-04-12 DIAGNOSIS — Z79899 Other long term (current) drug therapy: Secondary | ICD-10-CM | POA: Diagnosis not present

## 2019-04-12 DIAGNOSIS — N184 Chronic kidney disease, stage 4 (severe): Secondary | ICD-10-CM | POA: Diagnosis not present

## 2019-04-12 DIAGNOSIS — Z8673 Personal history of transient ischemic attack (TIA), and cerebral infarction without residual deficits: Secondary | ICD-10-CM | POA: Insufficient documentation

## 2019-04-12 DIAGNOSIS — R300 Dysuria: Secondary | ICD-10-CM | POA: Diagnosis present

## 2019-04-12 DIAGNOSIS — J45909 Unspecified asthma, uncomplicated: Secondary | ICD-10-CM | POA: Insufficient documentation

## 2019-04-12 DIAGNOSIS — B3741 Candidal cystitis and urethritis: Secondary | ICD-10-CM | POA: Diagnosis not present

## 2019-04-12 DIAGNOSIS — I129 Hypertensive chronic kidney disease with stage 1 through stage 4 chronic kidney disease, or unspecified chronic kidney disease: Secondary | ICD-10-CM | POA: Diagnosis not present

## 2019-04-12 LAB — URINALYSIS, ROUTINE W REFLEX MICROSCOPIC
Bilirubin Urine: NEGATIVE
Glucose, UA: NEGATIVE mg/dL
Nitrite: NEGATIVE
Specific Gravity, Urine: 1.015 (ref 1.005–1.030)
pH: 6 (ref 5.0–8.0)

## 2019-04-12 LAB — CBC WITH DIFFERENTIAL/PLATELET
Abs Immature Granulocytes: 0.06 10*3/uL (ref 0.00–0.07)
Basophils Absolute: 0 10*3/uL (ref 0.0–0.1)
Basophils Relative: 0 %
Eosinophils Absolute: 0.1 10*3/uL (ref 0.0–0.5)
Eosinophils Relative: 1 %
HCT: 39.7 % (ref 36.0–46.0)
Hemoglobin: 12 g/dL (ref 12.0–15.0)
Immature Granulocytes: 1 %
Lymphocytes Relative: 17 %
Lymphs Abs: 1.8 10*3/uL (ref 0.7–4.0)
MCH: 30.1 pg (ref 26.0–34.0)
MCHC: 30.2 g/dL (ref 30.0–36.0)
MCV: 99.5 fL (ref 80.0–100.0)
Monocytes Absolute: 0.7 10*3/uL (ref 0.1–1.0)
Monocytes Relative: 6 %
Neutro Abs: 8 10*3/uL — ABNORMAL HIGH (ref 1.7–7.7)
Neutrophils Relative %: 75 %
Platelets: 353 10*3/uL (ref 150–400)
RBC: 3.99 MIL/uL (ref 3.87–5.11)
RDW: 13.4 % (ref 11.5–15.5)
WBC: 10.8 10*3/uL — ABNORMAL HIGH (ref 4.0–10.5)
nRBC: 0 % (ref 0.0–0.2)

## 2019-04-12 LAB — BASIC METABOLIC PANEL
Anion gap: 12 (ref 5–15)
BUN: 35 mg/dL — ABNORMAL HIGH (ref 8–23)
CO2: 21 mmol/L — ABNORMAL LOW (ref 22–32)
Calcium: 8.9 mg/dL (ref 8.9–10.3)
Chloride: 106 mmol/L (ref 98–111)
Creatinine, Ser: 2.09 mg/dL — ABNORMAL HIGH (ref 0.44–1.00)
GFR calc Af Amer: 26 mL/min — ABNORMAL LOW (ref >60)
GFR calc non Af Amer: 23 mL/min — ABNORMAL LOW (ref >60)
Glucose, Bld: 87 mg/dL (ref 70–99)
Potassium: 4.3 mmol/L (ref 3.5–5.1)
Sodium: 139 mmol/L (ref 135–145)

## 2019-04-12 LAB — URINALYSIS, MICROSCOPIC (REFLEX): WBC, UA: >50 WBC/hpf (ref 0–5)

## 2019-04-12 MED ORDER — FLUCONAZOLE 200 MG PO TABS: 200.0000 mg | ORAL_TABLET | Freq: Every day | ORAL | 0 refills | Status: AC

## 2019-04-12 MED ORDER — FLUCONAZOLE 200 MG PO TABS
200.0000 mg | ORAL_TABLET | Freq: Once | ORAL | Status: AC
  Administered 2019-04-12: 200 mg via ORAL
  Filled 2019-04-12: qty 1

## 2019-04-12 MED ORDER — NYSTATIN 100000 UNIT/GM EX CREA: TOPICAL_CREAM | CUTANEOUS | 1 refills | Status: DC

## 2019-04-12 NOTE — ED Triage Notes (Signed)
patient reports that she sent a urine specimen to her PCP and was called today and told to come to the ED for IV fluids and possible antibiotics. Patient states she took 2 rounds of Doxycycline and became nauseated. Patient was placed on another antibiotic, but can not remember the name  Patient reports that she continues to have dysuria and frequency.

## 2019-04-12 NOTE — Discharge Instructions (Signed)
Your urine today was positive for yeast and white blood cells.  We believe that a yeast infection is causing your ongoing symptoms.  You have been started on fluconazole and should continue this medication as prescribed.  Follow-up with your primary care doctor in 1 week for recheck

## 2019-04-12 NOTE — ED Notes (Signed)
Pt assisted to RR with her walker to attempt to obtain a urine sample.  Pt unable to provide at this time.  Pt ambulated back to her room and placed on auto v/s. Pt requested something to drink and EDP advised she could have some ice chips so I provided it to her.  Pt also complaining of back pain from recent surgery and stated she is past due for her pain meds - advised EDP of same.

## 2019-04-12 NOTE — ED Provider Notes (Signed)
11:36 PM Patient care assumed from MD [place] at shift change.  73 year old female presents to the emergency department for persistent burning dysuria which has been present for 2 weeks and unrelieved with multiple courses of antibiotics.  Her urinalysis today shows pyuria with budding yeast.  No bacteriuria or nitrites.  Suspect candidal cystitis and urethritis as cause of her symptoms today.  She, otherwise, has a stable laboratory evaluation; only minimal leukocytosis.  Stable for discharge on fluconazole.  Also given nystatin cream for application to her external genitalia given evidence of vulvitis on exam by prior provider.  Will refer to PCP for follow-up.  Return precautions discussed and provided. Patient discharged in stable condition with no unaddressed concerns.  Results for orders placed or performed during the hospital encounter of 04/12/19  Urinalysis, Routine w reflex microscopic- may I&O cath if menses  Result Value Ref Range   Color, Urine YELLOW YELLOW   APPearance CLOUDY (A) CLEAR   Specific Gravity, Urine 1.015 1.005 - 1.030   pH 6.0 5.0 - 8.0   Glucose, UA NEGATIVE NEGATIVE mg/dL   Hgb urine dipstick SMALL (A) NEGATIVE   Bilirubin Urine NEGATIVE NEGATIVE   Ketones, ur TRACE (A) NEGATIVE mg/dL   Protein, ur TRACE (A) NEGATIVE mg/dL   Nitrite NEGATIVE NEGATIVE   Leukocytes,Ua LARGE (A) NEGATIVE  CBC with Differential/Platelet  Result Value Ref Range   WBC 10.8 (H) 4.0 - 10.5 K/uL   RBC 3.99 3.87 - 5.11 MIL/uL   Hemoglobin 12.0 12.0 - 15.0 g/dL   HCT 39.7 36.0 - 46.0 %   MCV 99.5 80.0 - 100.0 fL   MCH 30.1 26.0 - 34.0 pg   MCHC 30.2 30.0 - 36.0 g/dL   RDW 13.4 11.5 - 15.5 %   Platelets 353 150 - 400 K/uL   nRBC 0.0 0.0 - 0.2 %   Neutrophils Relative % 75 %   Neutro Abs 8.0 (H) 1.7 - 7.7 K/uL   Lymphocytes Relative 17 %   Lymphs Abs 1.8 0.7 - 4.0 K/uL   Monocytes Relative 6 %   Monocytes Absolute 0.7 0.1 - 1.0 K/uL   Eosinophils Relative 1 %   Eosinophils  Absolute 0.1 0.0 - 0.5 K/uL   Basophils Relative 0 %   Basophils Absolute 0.0 0.0 - 0.1 K/uL   Immature Granulocytes 1 %   Abs Immature Granulocytes 0.06 0.00 - 0.07 K/uL  Basic metabolic panel  Result Value Ref Range   Sodium 139 135 - 145 mmol/L   Potassium 4.3 3.5 - 5.1 mmol/L   Chloride 106 98 - 111 mmol/L   CO2 21 (L) 22 - 32 mmol/L   Glucose, Bld 87 70 - 99 mg/dL   BUN 35 (H) 8 - 23 mg/dL   Creatinine, Ser 2.09 (H) 0.44 - 1.00 mg/dL   Calcium 8.9 8.9 - 10.3 mg/dL   GFR calc non Af Amer 23 (L) >60 mL/min   GFR calc Af Amer 26 (L) >60 mL/min   Anion gap 12 5 - 15  Urinalysis, Microscopic (reflex)  Result Value Ref Range   RBC / HPF 0-5 0 - 5 RBC/hpf   WBC, UA >50 0 - 5 WBC/hpf   Bacteria, UA RARE (A) NONE SEEN   Squamous Epithelial / LPF 0-5 0 - 5   Budding Yeast PRESENT      Antonietta Breach, PA-C 04/12/19 [place], April, MD 04/12/19 2357

## 2019-04-12 NOTE — ED Provider Notes (Signed)
[facility] DEPT Provider Note  CSN: [id number] Arrival date & time: 04/12/19 1610  Chief Complaint(s) Abnormal Lab  HPI Carolyn Robertson is a 73 y.o. female   The history is provided by the patient.  Dysuria Pain quality:  Burning Pain severity:  Moderate Onset quality:  Gradual Duration:  2 weeks Timing:  Intermittent Progression:  Waxing and waning Chronicity:  New Worsened by:  Nothing Ineffective treatments:  Antibiotics (Cipro, Doxy, and now on Macrobid) Urinary symptoms: foul-smelling urine   Associated symptoms: no abdominal pain, no fever and no vomiting     Past Medical History Past Medical History:  Diagnosis Date  . Anxiety   . Arthritis    RA  . 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 Depression   . GERD (gastroesophageal reflux disease)   . History of blood transfusion    after miscarrige  . History of kidney stones    x 2  . Hypertension   . Hypothyroidism   . Incontinent of urine    incontinent at night  . Memory loss    "after TIA"  . Pneumonia    as a child and teenager  . Renal disorder    stage IV kidney disease  . Thyroid disease   . TIA (transient ischemic attack) 06/2018   Patient Active Problem List   Diagnosis Date Noted  . Spondylolisthesis, lumbar region 02/20/2019  . TIA (transient ischemic attack) 06/25/2018  . CVA (cerebrovascular accident) ([place]) 06/24/2018    Class: Acute  . Lumbar herniated disc 04/12/2018  . Stage 4 chronic kidney disease ([place]) 02/19/2018  . Chronic venous insufficiency 02/19/2018  . Primary localized osteoarthritis of right knee 11/14/2016   Home Medication(s) Prior to Admission medications   Medication Sig Start Date End Date Taking? Authorizing Provider  aspirin 81 MG EC tablet Take 1 tablet (81 mg total) by mouth daily. 06/27/18  Yes Buriev, Arie Sabina, MD  DULoxetine (CYMBALTA) 60 MG capsule Take 60 mg by mouth at bedtime.   Yes [provider]  estrogen, conjugated,-medroxyprogesterone (PREMPRO) 0.625-2.5 MG per tablet Take 1 tablet by mouth daily.   Yes [provider]  Febuxostat 80 MG TABS Take 40 mg by mouth at bedtime.    Yes [provider]  furosemide (LASIX) 40 MG tablet Take 40 mg by mouth daily as needed for edema.    Yes [provider]  HYDROcodone-acetaminophen (NORCO) 10-325 MG tablet Take 1 tablet by mouth every 4 (four) hours as needed for moderate pain. 02/21/19  Yes Viona Gilmore D, NP  nitrofurantoin, macrocrystal-monohydrate, (MACROBID) 100 MG capsule Take 100 mg by mouth 2 (two) times a day. 04/09/19  Yes [provider]  oxybutynin (DITROPAN) 5 MG tablet Take 5 mg by mouth daily.   Yes [provider]  pantoprazole (PROTONIX) 40 MG tablet Take 40 mg by mouth daily before breakfast. 02/22/18  Yes [provider]  SYNTHROID 175 MCG tablet Take 175 mcg by mouth daily before breakfast.  01/05/18  Yes [provider]  ciprofloxacin (CIPRO) 500 MG tablet 1 tablet every 18 hours Patient not taking: Reported on 04/12/2019 03/26/19   Dorie Rank, MD  cyclobenzaprine (FLEXERIL) 10 MG tablet Take 1 tablet (10 mg total) by mouth 3 (three) times daily as needed for muscle spasms. Patient not taking: Reported on 04/12/2019 02/21/19   Viona Gilmore D, NP  docusate sodium (COLACE) 100 MG capsule Take 1 capsule (100 mg total) by  mouth 2 (two) times daily. Patient not taking: Reported on 03/26/2019 02/21/19   Viona Gilmore D, NP                                                                                                                                    Past Surgical History Past Surgical History:  Procedure Laterality Date  . CHOLECYSTECTOMY    . DILATION AND CURETTAGE OF UTERUS    . EYE SURGERY Right    retina surgery  . KNEE SURGERY     bilat knees  . LUMBAR LAMINECTOMY/DECOMPRESSION MICRODISCECTOMY Right 04/12/2018   Procedure: MICRODISCECTOMY LUMBAR  FOUR- LUMBAR FIVE;  Surgeon: Newman Pies, MD;  Location: [facility];  Service: Neurosurgery;  Laterality: Right;  MICRODISCECTOMY LUMBAR FOUR- LUMBAR FIVE  . TONSILLECTOMY     Family History History reviewed. No pertinent family history.  Social History Social History   Tobacco Use  . Smoking status: Never Smoker  . Smokeless tobacco: Never Used  Substance Use Topics  . Alcohol use: No    Comment: rarely  . Drug use: No   Allergies Other, Penicillins, Sulfa antibiotics, Codeine, Hydrocodone-acetaminophen, Naproxen sodium, and Tape  Review of Systems Review of Systems  Constitutional: Negative for fever.  Gastrointestinal: Negative for abdominal pain and vomiting.  Genitourinary: Positive for dysuria.   All other systems are reviewed and are negative for acute change except as noted in the HPI  Physical Exam Vital Signs  I have reviewed the triage vital signs BP (!) 148/95   Pulse (!) 105   Temp 98.5 F (36.9 C) (Oral)   Resp 16   Ht 5\' 3"  (1.6 m)   Wt 68 kg   LMP  (LMP Unknown)   SpO2 98%   BMI 26.57 kg/m   Physical Exam Vitals signs reviewed. Exam conducted with a chaperone present.  Constitutional:      General: She is not in acute distress.    Appearance: She is well-developed. She is not diaphoretic.  HENT:     Head: Normocephalic and atraumatic.     Right Ear: External ear normal.     Left Ear: External ear normal.     Nose: Nose normal.  Eyes:     General: No scleral icterus.    Conjunctiva/sclera: Conjunctivae normal.  Neck:     Musculoskeletal: Normal range of motion.     Trachea: Phonation normal.  Cardiovascular:     Rate and Rhythm: Normal rate and regular rhythm.  Pulmonary:     Effort: Pulmonary effort is normal. No respiratory distress.     Breath sounds: No stridor.  Abdominal:     General: There is no distension.  Genitourinary:    Comments: Hyperemia to vulvar region. No satellite lesions, ulcers, or discharge. Musculoskeletal:  Normal range of motion.  Neurological:     Mental Status: She is alert and oriented to person, place, and time.  Psychiatric:  Behavior: Behavior normal.     ED Results and Treatments Labs (all labs ordered are listed, but only abnormal results are displayed) Labs Reviewed  CBC WITH DIFFERENTIAL/PLATELET - Abnormal; Notable for the following components:      Result Value   WBC 10.8 (*)    Neutro Abs 8.0 (*)    All other components within normal limits  BASIC METABOLIC PANEL - Abnormal; Notable for the following components:   CO2 21 (*)    BUN 35 (*)    Creatinine, Ser 2.09 (*)    GFR calc non Af Amer 23 (*)    GFR calc Af Amer 26 (*)    All other components within normal limits  URINE CULTURE  URINALYSIS, ROUTINE W REFLEX MICROSCOPIC                                                                                                                         EKG  EKG Interpretation  Date/Time:    Ventricular Rate:    PR Interval:    QRS Duration:   QT Interval:    QTC Calculation:   R Axis:     Text Interpretation:        Radiology No results found.  Pertinent labs & imaging results that were available during my care of the patient were reviewed by me and considered in my medical decision making (see chart for details).  Medications Ordered in ED Medications - No data to display                                                                                                                                  Procedures Procedures  (including critical care time)  Medical Decision Making / ED Course I have reviewed the nursing notes for this encounter and the patient's prior records (if available in EHR or on provided paperwork).  Persistent dysuria currently on 3rd Abx.  On exam patient has vulvitis, which does not appear to be fungal at this time.  Screening labs grossly reassuring with mild leukocytosis but improving renal function.  Currently pending UA.  On  review of records, UA from 2 weeks ago was not overtly convincing for urinary tract infection.  If UA is negative, sensation likely secondary to vulvitis. Would recommend lubricating ointment and PCP follow up.  Patient care turned over to [place], [place]  at 2200. Patient case and results discussed in  detail; please see their note for further ED managment.          Final Clinical Impression(s) / ED Diagnoses Final diagnoses:  None      This chart was dictated using voice recognition software.  Despite best efforts to proofread,  errors can occur which can change the documentation meaning.   Fatima Blank, MD 04/12/19 2228

## 2019-04-14 LAB — URINE CULTURE: Culture: NO GROWTH

## 2019-04-24 DIAGNOSIS — M7989 Other specified soft tissue disorders: Secondary | ICD-10-CM | POA: Diagnosis not present

## 2019-04-24 DIAGNOSIS — M25569 Pain in unspecified knee: Secondary | ICD-10-CM | POA: Diagnosis not present

## 2019-04-24 DIAGNOSIS — M1990 Unspecified osteoarthritis, unspecified site: Secondary | ICD-10-CM | POA: Diagnosis not present

## 2019-04-24 DIAGNOSIS — M353 Polymyalgia rheumatica: Secondary | ICD-10-CM | POA: Diagnosis not present

## 2019-04-24 DIAGNOSIS — M549 Dorsalgia, unspecified: Secondary | ICD-10-CM | POA: Diagnosis not present

## 2019-04-24 DIAGNOSIS — N189 Chronic kidney disease, unspecified: Secondary | ICD-10-CM | POA: Diagnosis not present

## 2019-04-24 DIAGNOSIS — M25519 Pain in unspecified shoulder: Secondary | ICD-10-CM | POA: Diagnosis not present

## 2019-04-24 DIAGNOSIS — E559 Vitamin D deficiency, unspecified: Secondary | ICD-10-CM | POA: Diagnosis not present

## 2019-04-24 DIAGNOSIS — M109 Gout, unspecified: Secondary | ICD-10-CM | POA: Diagnosis not present

## 2019-04-24 DIAGNOSIS — R768 Other specified abnormal immunological findings in serum: Secondary | ICD-10-CM | POA: Diagnosis not present

## 2019-04-24 DIAGNOSIS — M79643 Pain in unspecified hand: Secondary | ICD-10-CM | POA: Diagnosis not present

## 2019-04-25 DIAGNOSIS — M353 Polymyalgia rheumatica: Secondary | ICD-10-CM | POA: Diagnosis not present

## 2019-04-25 DIAGNOSIS — M1990 Unspecified osteoarthritis, unspecified site: Secondary | ICD-10-CM | POA: Diagnosis not present

## 2019-05-16 DIAGNOSIS — M48061 Spinal stenosis, lumbar region without neurogenic claudication: Secondary | ICD-10-CM | POA: Diagnosis not present

## 2019-05-16 DIAGNOSIS — M4316 Spondylolisthesis, lumbar region: Secondary | ICD-10-CM | POA: Diagnosis not present

## 2019-05-24 DIAGNOSIS — R300 Dysuria: Secondary | ICD-10-CM | POA: Diagnosis not present

## 2019-05-27 DIAGNOSIS — R531 Weakness: Secondary | ICD-10-CM | POA: Diagnosis not present

## 2019-05-27 DIAGNOSIS — R1030 Lower abdominal pain, unspecified: Secondary | ICD-10-CM | POA: Diagnosis not present

## 2019-05-27 DIAGNOSIS — I129 Hypertensive chronic kidney disease with stage 1 through stage 4 chronic kidney disease, or unspecified chronic kidney disease: Secondary | ICD-10-CM | POA: Diagnosis not present

## 2019-05-27 DIAGNOSIS — R471 Dysarthria and anarthria: Secondary | ICD-10-CM | POA: Diagnosis not present

## 2019-05-27 DIAGNOSIS — N184 Chronic kidney disease, stage 4 (severe): Secondary | ICD-10-CM | POA: Diagnosis not present

## 2019-05-28 DIAGNOSIS — R471 Dysarthria and anarthria: Secondary | ICD-10-CM | POA: Diagnosis not present

## 2019-05-28 DIAGNOSIS — R5383 Other fatigue: Secondary | ICD-10-CM | POA: Diagnosis not present

## 2019-05-28 DIAGNOSIS — I129 Hypertensive chronic kidney disease with stage 1 through stage 4 chronic kidney disease, or unspecified chronic kidney disease: Secondary | ICD-10-CM | POA: Diagnosis not present

## 2019-05-28 DIAGNOSIS — R000 Tachycardia, unspecified: Secondary | ICD-10-CM | POA: Diagnosis not present

## 2019-05-28 DIAGNOSIS — E039 Hypothyroidism, unspecified: Secondary | ICD-10-CM | POA: Diagnosis not present

## 2019-05-28 DIAGNOSIS — R531 Weakness: Secondary | ICD-10-CM | POA: Diagnosis not present

## 2019-05-28 DIAGNOSIS — N184 Chronic kidney disease, stage 4 (severe): Secondary | ICD-10-CM | POA: Diagnosis not present

## 2019-06-04 DIAGNOSIS — N390 Urinary tract infection, site not specified: Secondary | ICD-10-CM | POA: Diagnosis not present

## 2019-06-04 DIAGNOSIS — K760 Fatty (change of) liver, not elsewhere classified: Secondary | ICD-10-CM | POA: Diagnosis not present

## 2019-06-04 DIAGNOSIS — N184 Chronic kidney disease, stage 4 (severe): Secondary | ICD-10-CM | POA: Diagnosis not present

## 2019-06-04 DIAGNOSIS — J189 Pneumonia, unspecified organism: Secondary | ICD-10-CM | POA: Diagnosis not present

## 2019-06-04 DIAGNOSIS — R000 Tachycardia, unspecified: Secondary | ICD-10-CM | POA: Diagnosis not present

## 2019-06-04 DIAGNOSIS — Z4789 Encounter for other orthopedic aftercare: Secondary | ICD-10-CM | POA: Diagnosis not present

## 2019-06-04 DIAGNOSIS — R5383 Other fatigue: Secondary | ICD-10-CM | POA: Diagnosis not present

## 2019-06-04 DIAGNOSIS — R471 Dysarthria and anarthria: Secondary | ICD-10-CM | POA: Diagnosis not present

## 2019-06-04 DIAGNOSIS — D631 Anemia in chronic kidney disease: Secondary | ICD-10-CM | POA: Diagnosis not present

## 2019-06-04 DIAGNOSIS — R531 Weakness: Secondary | ICD-10-CM | POA: Diagnosis not present

## 2019-06-04 DIAGNOSIS — I129 Hypertensive chronic kidney disease with stage 1 through stage 4 chronic kidney disease, or unspecified chronic kidney disease: Secondary | ICD-10-CM | POA: Diagnosis not present

## 2019-06-04 DIAGNOSIS — E039 Hypothyroidism, unspecified: Secondary | ICD-10-CM | POA: Diagnosis not present

## 2019-06-05 DIAGNOSIS — R296 Repeated falls: Secondary | ICD-10-CM | POA: Diagnosis not present

## 2019-06-05 DIAGNOSIS — M545 Low back pain: Secondary | ICD-10-CM | POA: Diagnosis not present

## 2019-06-05 DIAGNOSIS — M79601 Pain in right arm: Secondary | ICD-10-CM | POA: Diagnosis not present

## 2019-06-05 DIAGNOSIS — I10 Essential (primary) hypertension: Secondary | ICD-10-CM | POA: Diagnosis not present

## 2019-06-05 DIAGNOSIS — M25551 Pain in right hip: Secondary | ICD-10-CM | POA: Diagnosis not present

## 2019-06-12 DIAGNOSIS — J189 Pneumonia, unspecified organism: Secondary | ICD-10-CM | POA: Diagnosis not present

## 2019-06-12 DIAGNOSIS — Z4789 Encounter for other orthopedic aftercare: Secondary | ICD-10-CM | POA: Diagnosis not present

## 2019-06-12 DIAGNOSIS — R471 Dysarthria and anarthria: Secondary | ICD-10-CM | POA: Diagnosis not present

## 2019-06-12 DIAGNOSIS — R531 Weakness: Secondary | ICD-10-CM | POA: Diagnosis not present

## 2019-06-12 DIAGNOSIS — N390 Urinary tract infection, site not specified: Secondary | ICD-10-CM | POA: Diagnosis not present

## 2019-06-12 DIAGNOSIS — R5383 Other fatigue: Secondary | ICD-10-CM | POA: Diagnosis not present

## 2019-06-13 DIAGNOSIS — J189 Pneumonia, unspecified organism: Secondary | ICD-10-CM | POA: Diagnosis not present

## 2019-06-13 DIAGNOSIS — N390 Urinary tract infection, site not specified: Secondary | ICD-10-CM | POA: Diagnosis not present

## 2019-06-13 DIAGNOSIS — Z4789 Encounter for other orthopedic aftercare: Secondary | ICD-10-CM | POA: Diagnosis not present

## 2019-06-13 DIAGNOSIS — R471 Dysarthria and anarthria: Secondary | ICD-10-CM | POA: Diagnosis not present

## 2019-06-13 DIAGNOSIS — R531 Weakness: Secondary | ICD-10-CM | POA: Diagnosis not present

## 2019-06-13 DIAGNOSIS — R5383 Other fatigue: Secondary | ICD-10-CM | POA: Diagnosis not present

## 2019-06-14 DIAGNOSIS — N390 Urinary tract infection, site not specified: Secondary | ICD-10-CM | POA: Diagnosis not present

## 2019-06-19 DIAGNOSIS — R5383 Other fatigue: Secondary | ICD-10-CM | POA: Diagnosis not present

## 2019-06-19 DIAGNOSIS — R471 Dysarthria and anarthria: Secondary | ICD-10-CM | POA: Diagnosis not present

## 2019-06-19 DIAGNOSIS — J189 Pneumonia, unspecified organism: Secondary | ICD-10-CM | POA: Diagnosis not present

## 2019-06-19 DIAGNOSIS — Z4789 Encounter for other orthopedic aftercare: Secondary | ICD-10-CM | POA: Diagnosis not present

## 2019-06-19 DIAGNOSIS — R531 Weakness: Secondary | ICD-10-CM | POA: Diagnosis not present

## 2019-06-19 DIAGNOSIS — N390 Urinary tract infection, site not specified: Secondary | ICD-10-CM | POA: Diagnosis not present

## 2019-06-20 DIAGNOSIS — Z4789 Encounter for other orthopedic aftercare: Secondary | ICD-10-CM | POA: Diagnosis not present

## 2019-06-20 DIAGNOSIS — N390 Urinary tract infection, site not specified: Secondary | ICD-10-CM | POA: Diagnosis not present

## 2019-06-20 DIAGNOSIS — R531 Weakness: Secondary | ICD-10-CM | POA: Diagnosis not present

## 2019-06-20 DIAGNOSIS — R471 Dysarthria and anarthria: Secondary | ICD-10-CM | POA: Diagnosis not present

## 2019-06-20 DIAGNOSIS — J189 Pneumonia, unspecified organism: Secondary | ICD-10-CM | POA: Diagnosis not present

## 2019-06-20 DIAGNOSIS — R5383 Other fatigue: Secondary | ICD-10-CM | POA: Diagnosis not present

## 2019-06-21 DIAGNOSIS — R471 Dysarthria and anarthria: Secondary | ICD-10-CM | POA: Diagnosis not present

## 2019-06-21 DIAGNOSIS — Z4789 Encounter for other orthopedic aftercare: Secondary | ICD-10-CM | POA: Diagnosis not present

## 2019-06-21 DIAGNOSIS — J189 Pneumonia, unspecified organism: Secondary | ICD-10-CM | POA: Diagnosis not present

## 2019-06-21 DIAGNOSIS — R531 Weakness: Secondary | ICD-10-CM | POA: Diagnosis not present

## 2019-06-21 DIAGNOSIS — R5383 Other fatigue: Secondary | ICD-10-CM | POA: Diagnosis not present

## 2019-06-21 DIAGNOSIS — N390 Urinary tract infection, site not specified: Secondary | ICD-10-CM | POA: Diagnosis not present

## 2019-06-26 DIAGNOSIS — R471 Dysarthria and anarthria: Secondary | ICD-10-CM | POA: Diagnosis not present

## 2019-06-26 DIAGNOSIS — R531 Weakness: Secondary | ICD-10-CM | POA: Diagnosis not present

## 2019-06-26 DIAGNOSIS — N390 Urinary tract infection, site not specified: Secondary | ICD-10-CM | POA: Diagnosis not present

## 2019-06-26 DIAGNOSIS — R5383 Other fatigue: Secondary | ICD-10-CM | POA: Diagnosis not present

## 2019-06-26 DIAGNOSIS — J189 Pneumonia, unspecified organism: Secondary | ICD-10-CM | POA: Diagnosis not present

## 2019-06-26 DIAGNOSIS — Z4789 Encounter for other orthopedic aftercare: Secondary | ICD-10-CM | POA: Diagnosis not present

## 2019-06-28 DIAGNOSIS — Z4789 Encounter for other orthopedic aftercare: Secondary | ICD-10-CM | POA: Diagnosis not present

## 2019-06-28 DIAGNOSIS — J189 Pneumonia, unspecified organism: Secondary | ICD-10-CM | POA: Diagnosis not present

## 2019-06-28 DIAGNOSIS — R471 Dysarthria and anarthria: Secondary | ICD-10-CM | POA: Diagnosis not present

## 2019-06-28 DIAGNOSIS — R5383 Other fatigue: Secondary | ICD-10-CM | POA: Diagnosis not present

## 2019-06-28 DIAGNOSIS — N390 Urinary tract infection, site not specified: Secondary | ICD-10-CM | POA: Diagnosis not present

## 2019-06-28 DIAGNOSIS — R531 Weakness: Secondary | ICD-10-CM | POA: Diagnosis not present

## 2019-07-01 DIAGNOSIS — M1990 Unspecified osteoarthritis, unspecified site: Secondary | ICD-10-CM | POA: Diagnosis not present

## 2019-07-01 DIAGNOSIS — N184 Chronic kidney disease, stage 4 (severe): Secondary | ICD-10-CM | POA: Diagnosis not present

## 2019-07-01 DIAGNOSIS — R300 Dysuria: Secondary | ICD-10-CM | POA: Diagnosis not present

## 2019-07-01 DIAGNOSIS — I129 Hypertensive chronic kidney disease with stage 1 through stage 4 chronic kidney disease, or unspecified chronic kidney disease: Secondary | ICD-10-CM | POA: Diagnosis not present

## 2019-07-01 DIAGNOSIS — M25561 Pain in right knee: Secondary | ICD-10-CM | POA: Diagnosis not present

## 2019-07-01 DIAGNOSIS — N390 Urinary tract infection, site not specified: Secondary | ICD-10-CM | POA: Diagnosis not present

## 2019-07-01 DIAGNOSIS — M25562 Pain in left knee: Secondary | ICD-10-CM | POA: Diagnosis not present

## 2019-07-02 DIAGNOSIS — R471 Dysarthria and anarthria: Secondary | ICD-10-CM | POA: Diagnosis not present

## 2019-07-02 DIAGNOSIS — R531 Weakness: Secondary | ICD-10-CM | POA: Diagnosis not present

## 2019-07-02 DIAGNOSIS — N390 Urinary tract infection, site not specified: Secondary | ICD-10-CM | POA: Diagnosis not present

## 2019-07-02 DIAGNOSIS — J189 Pneumonia, unspecified organism: Secondary | ICD-10-CM | POA: Diagnosis not present

## 2019-07-02 DIAGNOSIS — Z4789 Encounter for other orthopedic aftercare: Secondary | ICD-10-CM | POA: Diagnosis not present

## 2019-07-02 DIAGNOSIS — R5383 Other fatigue: Secondary | ICD-10-CM | POA: Diagnosis not present

## 2019-07-04 DIAGNOSIS — K760 Fatty (change of) liver, not elsewhere classified: Secondary | ICD-10-CM | POA: Diagnosis not present

## 2019-07-04 DIAGNOSIS — Z4789 Encounter for other orthopedic aftercare: Secondary | ICD-10-CM | POA: Diagnosis not present

## 2019-07-04 DIAGNOSIS — R531 Weakness: Secondary | ICD-10-CM | POA: Diagnosis not present

## 2019-07-04 DIAGNOSIS — R000 Tachycardia, unspecified: Secondary | ICD-10-CM | POA: Diagnosis not present

## 2019-07-04 DIAGNOSIS — E039 Hypothyroidism, unspecified: Secondary | ICD-10-CM | POA: Diagnosis not present

## 2019-07-04 DIAGNOSIS — I129 Hypertensive chronic kidney disease with stage 1 through stage 4 chronic kidney disease, or unspecified chronic kidney disease: Secondary | ICD-10-CM | POA: Diagnosis not present

## 2019-07-04 DIAGNOSIS — R471 Dysarthria and anarthria: Secondary | ICD-10-CM | POA: Diagnosis not present

## 2019-07-04 DIAGNOSIS — D631 Anemia in chronic kidney disease: Secondary | ICD-10-CM | POA: Diagnosis not present

## 2019-07-04 DIAGNOSIS — J189 Pneumonia, unspecified organism: Secondary | ICD-10-CM | POA: Diagnosis not present

## 2019-07-04 DIAGNOSIS — N184 Chronic kidney disease, stage 4 (severe): Secondary | ICD-10-CM | POA: Diagnosis not present

## 2019-07-04 DIAGNOSIS — N390 Urinary tract infection, site not specified: Secondary | ICD-10-CM | POA: Diagnosis not present

## 2019-07-04 DIAGNOSIS — R5383 Other fatigue: Secondary | ICD-10-CM | POA: Diagnosis not present

## 2019-07-05 DIAGNOSIS — R351 Nocturia: Secondary | ICD-10-CM | POA: Diagnosis not present

## 2019-07-05 DIAGNOSIS — N3946 Mixed incontinence: Secondary | ICD-10-CM | POA: Diagnosis not present

## 2019-07-05 DIAGNOSIS — R350 Frequency of micturition: Secondary | ICD-10-CM | POA: Diagnosis not present

## 2019-07-09 DIAGNOSIS — R531 Weakness: Secondary | ICD-10-CM | POA: Diagnosis not present

## 2019-07-09 DIAGNOSIS — J189 Pneumonia, unspecified organism: Secondary | ICD-10-CM | POA: Diagnosis not present

## 2019-07-09 DIAGNOSIS — R5383 Other fatigue: Secondary | ICD-10-CM | POA: Diagnosis not present

## 2019-07-09 DIAGNOSIS — R310 Gross hematuria: Secondary | ICD-10-CM | POA: Diagnosis not present

## 2019-07-09 DIAGNOSIS — N200 Calculus of kidney: Secondary | ICD-10-CM | POA: Diagnosis not present

## 2019-07-09 DIAGNOSIS — N390 Urinary tract infection, site not specified: Secondary | ICD-10-CM | POA: Diagnosis not present

## 2019-07-09 DIAGNOSIS — Z4789 Encounter for other orthopedic aftercare: Secondary | ICD-10-CM | POA: Diagnosis not present

## 2019-07-09 DIAGNOSIS — R471 Dysarthria and anarthria: Secondary | ICD-10-CM | POA: Diagnosis not present

## 2019-07-11 DIAGNOSIS — Z4789 Encounter for other orthopedic aftercare: Secondary | ICD-10-CM | POA: Diagnosis not present

## 2019-07-11 DIAGNOSIS — R531 Weakness: Secondary | ICD-10-CM | POA: Diagnosis not present

## 2019-07-11 DIAGNOSIS — R471 Dysarthria and anarthria: Secondary | ICD-10-CM | POA: Diagnosis not present

## 2019-07-11 DIAGNOSIS — J189 Pneumonia, unspecified organism: Secondary | ICD-10-CM | POA: Diagnosis not present

## 2019-07-11 DIAGNOSIS — R5383 Other fatigue: Secondary | ICD-10-CM | POA: Diagnosis not present

## 2019-07-11 DIAGNOSIS — N390 Urinary tract infection, site not specified: Secondary | ICD-10-CM | POA: Diagnosis not present

## 2019-07-17 DIAGNOSIS — R350 Frequency of micturition: Secondary | ICD-10-CM | POA: Diagnosis not present

## 2019-07-17 DIAGNOSIS — N3946 Mixed incontinence: Secondary | ICD-10-CM | POA: Diagnosis not present

## 2019-07-19 DIAGNOSIS — R5383 Other fatigue: Secondary | ICD-10-CM | POA: Diagnosis not present

## 2019-07-19 DIAGNOSIS — J189 Pneumonia, unspecified organism: Secondary | ICD-10-CM | POA: Diagnosis not present

## 2019-07-19 DIAGNOSIS — R531 Weakness: Secondary | ICD-10-CM | POA: Diagnosis not present

## 2019-07-19 DIAGNOSIS — N390 Urinary tract infection, site not specified: Secondary | ICD-10-CM | POA: Diagnosis not present

## 2019-07-19 DIAGNOSIS — R471 Dysarthria and anarthria: Secondary | ICD-10-CM | POA: Diagnosis not present

## 2019-07-19 DIAGNOSIS — Z4789 Encounter for other orthopedic aftercare: Secondary | ICD-10-CM | POA: Diagnosis not present

## 2019-07-23 ENCOUNTER — Emergency Department (HOSPITAL_COMMUNITY): Payer: Medicare Other

## 2019-07-23 ENCOUNTER — Encounter (HOSPITAL_COMMUNITY): Payer: Self-pay

## 2019-07-23 ENCOUNTER — Emergency Department (HOSPITAL_COMMUNITY)
Admission: EM | Admit: 2019-07-23 | Discharge: 2019-07-23 | Disposition: A | Discharge status: Home / Self Care | Payer: Medicare Other | Attending: Emergency Medicine | Admitting: Emergency Medicine

## 2019-07-23 ENCOUNTER — Other Ambulatory Visit: Payer: Self-pay

## 2019-07-23 DIAGNOSIS — Y9389 Activity, other specified: Secondary | ICD-10-CM | POA: Insufficient documentation

## 2019-07-23 DIAGNOSIS — Y9289 Other specified places as the place of occurrence of the external cause: Secondary | ICD-10-CM | POA: Insufficient documentation

## 2019-07-23 DIAGNOSIS — M170 Bilateral primary osteoarthritis of knee: Secondary | ICD-10-CM | POA: Diagnosis not present

## 2019-07-23 DIAGNOSIS — Y999 Unspecified external cause status: Secondary | ICD-10-CM | POA: Insufficient documentation

## 2019-07-23 DIAGNOSIS — E039 Hypothyroidism, unspecified: Secondary | ICD-10-CM | POA: Diagnosis not present

## 2019-07-23 DIAGNOSIS — R52 Pain, unspecified: Secondary | ICD-10-CM | POA: Diagnosis not present

## 2019-07-23 DIAGNOSIS — I129 Hypertensive chronic kidney disease with stage 1 through stage 4 chronic kidney disease, or unspecified chronic kidney disease: Secondary | ICD-10-CM | POA: Diagnosis not present

## 2019-07-23 DIAGNOSIS — W01198A Fall on same level from slipping, tripping and stumbling with subsequent striking against other object, initial encounter: Secondary | ICD-10-CM | POA: Insufficient documentation

## 2019-07-23 DIAGNOSIS — Z7982 Long term (current) use of aspirin: Secondary | ICD-10-CM | POA: Diagnosis not present

## 2019-07-23 DIAGNOSIS — S0083XA Contusion of other part of head, initial encounter: Secondary | ICD-10-CM | POA: Diagnosis not present

## 2019-07-23 DIAGNOSIS — S0990XA Unspecified injury of head, initial encounter: Secondary | ICD-10-CM | POA: Diagnosis present

## 2019-07-23 DIAGNOSIS — J45909 Unspecified asthma, uncomplicated: Secondary | ICD-10-CM | POA: Diagnosis not present

## 2019-07-23 DIAGNOSIS — Z79899 Other long term (current) drug therapy: Secondary | ICD-10-CM | POA: Diagnosis not present

## 2019-07-23 DIAGNOSIS — W19XXXA Unspecified fall, initial encounter: Secondary | ICD-10-CM | POA: Diagnosis not present

## 2019-07-23 DIAGNOSIS — S199XXA Unspecified injury of neck, initial encounter: Secondary | ICD-10-CM | POA: Diagnosis not present

## 2019-07-23 DIAGNOSIS — S0093XA Contusion of unspecified part of head, initial encounter: Secondary | ICD-10-CM | POA: Diagnosis not present

## 2019-07-23 DIAGNOSIS — S0003XA Contusion of scalp, initial encounter: Secondary | ICD-10-CM | POA: Diagnosis not present

## 2019-07-23 DIAGNOSIS — I10 Essential (primary) hypertension: Secondary | ICD-10-CM | POA: Diagnosis not present

## 2019-07-23 DIAGNOSIS — N184 Chronic kidney disease, stage 4 (severe): Secondary | ICD-10-CM | POA: Diagnosis not present

## 2019-07-23 DIAGNOSIS — R296 Repeated falls: Secondary | ICD-10-CM | POA: Diagnosis not present

## 2019-07-23 HISTORY — DX: Unspecified kidney failure: N19

## 2019-07-23 NOTE — ED Provider Notes (Signed)
[facility] DEPT Provider Note   CSN: [id number] Arrival date & time: 07/23/19  1526     History   Chief Complaint Chief Complaint  Patient presents with  . Fall    HPI Carolyn Robertson is a 73 y.o. female.     The history is provided by the patient.  Fall This is a new problem. The current episode started 1 to 2 hours ago. The problem occurs constantly. The problem has not changed since onset.Associated symptoms include headaches. Pertinent negatives include no chest pain, no abdominal pain and no shortness of breath. Nothing aggravates the symptoms. Nothing relieves the symptoms. She has tried nothing for the symptoms. The treatment provided no relief.    Past Medical History:  Diagnosis Date  . Anxiety   . Arthritis    RA  . 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 Depression   . GERD (gastroesophageal reflux disease)   . History of blood transfusion    after miscarrige  . History of kidney stones    x 2  . Hypertension   . Hypothyroidism   . Incontinent of urine    incontinent at night  . Kidney failure   . Memory loss    "after TIA"  . Pneumonia    as a child and teenager  . Renal disorder    stage IV kidney disease  . Thyroid disease   . TIA (transient ischemic attack) 06/2018    Patient Active Problem List   Diagnosis Date Noted  . Spondylolisthesis, lumbar region 02/20/2019  . TIA (transient ischemic attack) 06/25/2018  . CVA (cerebrovascular accident) ([facility]) 06/24/2018    Class: Acute  . Lumbar herniated disc 04/12/2018  . Stage 4 chronic kidney disease ([facility]) 02/19/2018  . Chronic venous insufficiency 02/19/2018  . Primary localized osteoarthritis of right knee 11/14/2016    Past Surgical History:  Procedure Laterality Date  . CHOLECYSTECTOMY    . DILATION AND CURETTAGE OF UTERUS    . EYE SURGERY Right    retina surgery  . KNEE SURGERY     bilat knees  . LUMBAR LAMINECTOMY/DECOMPRESSION  MICRODISCECTOMY Right 04/12/2018   Procedure: MICRODISCECTOMY LUMBAR FOUR- LUMBAR FIVE;  Surgeon: Newman Pies, MD;  Location: [facility];  Service: Neurosurgery;  Laterality: Right;  MICRODISCECTOMY LUMBAR FOUR- LUMBAR FIVE  . TONSILLECTOMY       OB History   No obstetric history on file.      Home Medications    Prior to Admission medications   Medication Sig Start Date End Date Taking? Authorizing Provider  aspirin 81 MG EC tablet Take 1 tablet (81 mg total) by mouth daily. 06/27/18  Yes Buriev, Arie Sabina, MD  cyanocobalamin (,VITAMIN B-12,) 1000 MCG/ML injection Inject 1 mL into the skin every 28 (twenty-eight) days. 05/08/19  Yes [provider]  DULoxetine (CYMBALTA) 60 MG capsule Take 60 mg by mouth at bedtime.   Yes [provider]  estrogen, conjugated,-medroxyprogesterone (PREMPRO) 0.625-2.5 MG per tablet Take 1 tablet by mouth daily.   Yes [provider]  Febuxostat 80 MG TABS Take 40 mg by mouth at bedtime.    Yes [provider]  furosemide (LASIX) 40 MG tablet Take 40 mg by mouth daily as needed for edema.    Yes [provider]  HYDROcodone-acetaminophen (NORCO) 10-325 MG tablet Take 1 tablet by mouth every 4 (four) hours as needed for moderate pain. 02/21/19  Yes [name], NP  HYDROcodone-acetaminophen (NORCO/VICODIN) 5-325 MG tablet Take 1 tablet by mouth every 4 (four) hours as needed for pain. 06/14/19  Yes [provider]  levothyroxine (SYNTHROID) 200 MCG tablet Take 200 mcg by mouth See admin instructions. Take 274mcg by mouth on Monday, Wednesday and Friday 05/28/19  Yes [provider]  pantoprazole (PROTONIX) 40 MG tablet Take 40 mg by mouth daily before breakfast. 02/22/18  Yes [provider]  SYNTHROID 175 MCG tablet Take 175 mcg by mouth daily before breakfast. Take 14mcg by mouth on Tuesdays, Thursdays, Saturdays and Sundays 01/05/18  Yes [provider]  trimethoprim (TRIMPEX) 100  MG tablet Take 100 mg by mouth daily. 07/17/19  Yes [provider]  cyclobenzaprine (FLEXERIL) 10 MG tablet Take 1 tablet (10 mg total) by mouth 3 (three) times daily as needed for muscle spasms. Patient not taking: Reported on 04/12/2019 02/21/19   Viona Gilmore D, NP  docusate sodium (COLACE) 100 MG capsule Take 1 capsule (100 mg total) by mouth 2 (two) times daily. Patient not taking: Reported on 03/26/2019 02/21/19   Viona Gilmore D, NP  nystatin cream (MYCOSTATIN) Apply to affected area 2 times daily 04/12/19   Antonietta Breach, PA-C    Family History History reviewed. No pertinent family history.  Social History Social History   Tobacco Use  . Smoking status: Never Smoker  . Smokeless tobacco: Never Used  Substance Use Topics  . Alcohol use: No    Comment: rarely  . Drug use: No     Allergies   Other, Penicillins, Sulfa antibiotics, Codeine, Hydrocodone-acetaminophen, Naproxen sodium, and Tape   Review of Systems Review of Systems  Constitutional: Negative for chills and fever.  HENT: Negative for ear pain and sore throat.   Eyes: Negative for pain and visual disturbance.  Respiratory: Negative for cough and shortness of breath.   Cardiovascular: Negative for chest pain and palpitations.  Gastrointestinal: Negative for abdominal pain and vomiting.  Genitourinary: Negative for dysuria and hematuria.  Musculoskeletal: Negative for arthralgias and back pain.  Skin: Positive for wound. Negative for color change and rash.  Neurological: Positive for headaches. Negative for seizures and syncope.  All other systems reviewed and are negative.    Physical Exam Updated Vital Signs BP (!) 151/82   Pulse 89   Temp 98.3 F (36.8 C) (Oral)   Resp 18   LMP  (LMP Unknown)   SpO2 95%   Physical Exam Vitals signs and nursing note reviewed.  Constitutional:      General: She is not in acute distress.    Appearance: She is well-developed.  HENT:     Head:     Comments:  Hematoma to the occipital area, abrasion but no obvious laceration    Mouth/Throat:     Mouth: Mucous membranes are moist.  Eyes:     Extraocular Movements: Extraocular movements intact.     Conjunctiva/sclera: Conjunctivae normal.     Pupils: Pupils are equal, round, and reactive to light.  Neck:     Musculoskeletal: Neck supple. No muscular tenderness.     Comments: In cervical collar Cardiovascular:     Rate and Rhythm: Normal rate and regular rhythm.     Pulses: Normal pulses.     Heart sounds: Normal heart sounds. No murmur.  Pulmonary:     Effort: Pulmonary effort is normal. No respiratory distress.     Breath sounds: Normal breath sounds.  Abdominal:     Palpations: Abdomen is soft.  Tenderness: There is no abdominal tenderness.  Musculoskeletal: Normal range of motion.        General: No tenderness.     Comments: No midline spinal tenderness  Skin:    General: Skin is warm and dry.  Neurological:     General: No focal deficit present.     Mental Status: She is alert and oriented to person, place, and time.     Cranial Nerves: No cranial nerve deficit.     Sensory: No sensory deficit.     Motor: No weakness.     Coordination: Coordination normal.     Comments: 5+ out of 5 strength throughout, normal sensation, no drift, normal finger-to-nose finger      ED Treatments / Results  Labs (all labs ordered are listed, but only abnormal results are displayed) Labs Reviewed - No data to display  EKG None  Radiology Ct Cervical Spine Wo Contrast  Result Date: 07/23/2019 CLINICAL DATA:  73 year old who lost her balance while walking in her driveway earlier today, falling, striking her head. Patient denies loss of consciousness. Hematoma to the back of the head. Multiple recent falls. Initial encounter. Personal history of stroke. EXAM: CT HEAD WITHOUT CONTRAST CT CERVICAL SPINE WITHOUT CONTRAST TECHNIQUE: Multidetector CT imaging of the head and cervical spine was  performed following the standard protocol without intravenous contrast. Multiplanar CT image reconstructions of the cervical spine were also generated. COMPARISON:  CT head 03/26/2019 and earlier. No prior cervical spine CT. FINDINGS: CT HEAD FINDINGS Brain: Moderate to severe cortical and deep atrophy and moderate cerebellar atrophy, unchanged. Severe changes of small vessel disease of the white matter diffusely, unchanged. No mass lesion. No midline shift. No acute hemorrhage or hematoma. No extra-axial fluid collections. No evidence of acute infarction. Vascular: Moderate BILATERAL carotid siphon atherosclerosis. No hyperdense vessel. Skull: No skull fractures identified. No intrinsic osseous abnormality. Visualized paranasal sinuses well aerated. Nasal bones intact. Normal-appearing sella turcica. CT Sinuses/Orbits: Air-fluid levels in the sphenoid sinuses. Remaining visualized paranasal sinuses, BILATERAL mastoid air cells and BILATERAL middle ear cavities well-aerated. Visualized orbits and globes normal in appearance. Other: RIGHT parietal scalp hematoma and laceration, with small gas bubbles in the scalp. CT CERVICAL SPINE FINDINGS Alignment: Anatomic posterior alignment. Facet joints anatomically aligned throughout with degenerative changes. Skull base and vertebrae: No fractures identified involving the cervical spine. Coronal reformatted images demonstrate an intact craniocervical junction, intact dens and intact lateral masses throughout. Soft tissues and spinal canal: No evidence of paraspinous or spinal canal hematoma. No evidence of spinal stenosis. Disc levels: Severe disc space narrowing and associated endplate hypertrophic changes at C5-6 and C6-7. Facet and uncinate hypertrophy account for multilevel foraminal stenoses including severe RIGHT and moderate LEFT C5-6, severe RIGHT and moderate LEFT C6-7. Upper chest: Visualized lung apices clear. Atherosclerosis involving the aortic arch and the LEFT  subclavian artery. Other: Atherosclerosis involving the LEFT cervical carotid artery. IMPRESSION: 1. No acute intracranial abnormality. 2. RIGHT parietal scalp hematoma without underlying skull fracture. 3. Stable moderate to severe generalized atrophy and severe chronic microvascular ischemic changes of the white matter. 4. Acute BILATERAL sphenoid sinusitis. 5. No cervical spine fractures identified. 6. Multilevel degenerative disc disease, spondylosis and facet degenerative changes with multilevel foraminal stenoses as detailed above. Electronically Signed   By: Evangeline Dakin M.D.   On: 07/23/2019 19:02    Procedures Procedures (including critical care time)  Medications Ordered in ED Medications - No data to display   Initial Impression / Assessment and Plan /  ED Course  I have reviewed the triage vital signs and the nursing notes.  Pertinent labs & imaging results that were available during my care of the patient were reviewed by me and considered in my medical decision making (see chart for details).     Carolyn Robertson is a 73 year old female with history of hypertension, stroke, CKD who presents the ED after a fall.  Patient with unremarkable vitals.  No fever.  Patient lost her balance hit the back of her head on concrete.  No loss of consciousness.  Has hematoma to the back of the head with abrasion but no obvious laceration.  No current bleeding.  Patient is on a blood thinners.  Has no bony tenderness elsewhere.  Will get a head CT and cervical CT.  Clear breath sounds.  No midline spinal pain.  No hip pain.  CT scan of head and neck is overall unremarkable.  Patient with just abrasion to the scalp, no obvious laceration.  Wound is hemostatic.  Discharged from ED in good condition.  Given return precautions.  Recommend ice, Tylenol.  This chart was dictated using voice recognition software.  Despite best efforts to proofread,  errors can occur which can change the documentation  meaning.    Final Clinical Impressions(s) / ED Diagnoses   Final diagnoses:  Contusion of head, unspecified part of head, initial encounter    ED Discharge Orders    None       Lennice Sites, DO 07/23/19 1922

## 2019-07-23 NOTE — ED Notes (Signed)
Pt removed c-collar without staff knowing or advising it was okay to do so. Pt was transported to CT.

## 2019-07-23 NOTE — ED Triage Notes (Signed)
Pt BIB EMS from home. Pt reports losing balance and falling in her driveway, pt hit head, denies LOC. Fall witnessed by husband. Pt has R sided weakness from previous stroke, pt has had several falls over last few weeks. Hematoma to back of head. Pt denies nausea. Pt has stage 4 kidney failure.

## 2019-07-23 NOTE — ED Notes (Signed)
An After Visit Summary was printed and given to the patient. Discharge instructions given and no further questions at this time.  Pt leaving with husband. 

## 2019-07-24 DIAGNOSIS — R531 Weakness: Secondary | ICD-10-CM | POA: Diagnosis not present

## 2019-07-24 DIAGNOSIS — R42 Dizziness and giddiness: Secondary | ICD-10-CM | POA: Diagnosis not present

## 2019-07-24 DIAGNOSIS — N184 Chronic kidney disease, stage 4 (severe): Secondary | ICD-10-CM | POA: Diagnosis not present

## 2019-07-24 DIAGNOSIS — I129 Hypertensive chronic kidney disease with stage 1 through stage 4 chronic kidney disease, or unspecified chronic kidney disease: Secondary | ICD-10-CM | POA: Diagnosis not present

## 2019-07-24 DIAGNOSIS — Z79899 Other long term (current) drug therapy: Secondary | ICD-10-CM | POA: Diagnosis not present

## 2019-07-24 DIAGNOSIS — R296 Repeated falls: Secondary | ICD-10-CM | POA: Diagnosis not present

## 2019-07-26 DIAGNOSIS — Z4789 Encounter for other orthopedic aftercare: Secondary | ICD-10-CM | POA: Diagnosis not present

## 2019-07-26 DIAGNOSIS — R531 Weakness: Secondary | ICD-10-CM | POA: Diagnosis not present

## 2019-07-26 DIAGNOSIS — J189 Pneumonia, unspecified organism: Secondary | ICD-10-CM | POA: Diagnosis not present

## 2019-07-26 DIAGNOSIS — R5383 Other fatigue: Secondary | ICD-10-CM | POA: Diagnosis not present

## 2019-07-26 DIAGNOSIS — R471 Dysarthria and anarthria: Secondary | ICD-10-CM | POA: Diagnosis not present

## 2019-07-26 DIAGNOSIS — N390 Urinary tract infection, site not specified: Secondary | ICD-10-CM | POA: Diagnosis not present

## 2019-07-29 DIAGNOSIS — R531 Weakness: Secondary | ICD-10-CM | POA: Diagnosis not present

## 2019-07-29 DIAGNOSIS — R471 Dysarthria and anarthria: Secondary | ICD-10-CM | POA: Diagnosis not present

## 2019-07-29 DIAGNOSIS — N390 Urinary tract infection, site not specified: Secondary | ICD-10-CM | POA: Diagnosis not present

## 2019-07-29 DIAGNOSIS — J189 Pneumonia, unspecified organism: Secondary | ICD-10-CM | POA: Diagnosis not present

## 2019-07-29 DIAGNOSIS — R5383 Other fatigue: Secondary | ICD-10-CM | POA: Diagnosis not present

## 2019-07-29 DIAGNOSIS — Z4789 Encounter for other orthopedic aftercare: Secondary | ICD-10-CM | POA: Diagnosis not present

## 2019-07-30 DIAGNOSIS — R5383 Other fatigue: Secondary | ICD-10-CM | POA: Diagnosis not present

## 2019-07-30 DIAGNOSIS — J189 Pneumonia, unspecified organism: Secondary | ICD-10-CM | POA: Diagnosis not present

## 2019-07-30 DIAGNOSIS — R531 Weakness: Secondary | ICD-10-CM | POA: Diagnosis not present

## 2019-07-30 DIAGNOSIS — Z4789 Encounter for other orthopedic aftercare: Secondary | ICD-10-CM | POA: Diagnosis not present

## 2019-07-30 DIAGNOSIS — R471 Dysarthria and anarthria: Secondary | ICD-10-CM | POA: Diagnosis not present

## 2019-07-30 DIAGNOSIS — N390 Urinary tract infection, site not specified: Secondary | ICD-10-CM | POA: Diagnosis not present

## 2019-08-03 DIAGNOSIS — R471 Dysarthria and anarthria: Secondary | ICD-10-CM | POA: Diagnosis not present

## 2019-08-03 DIAGNOSIS — F329 Major depressive disorder, single episode, unspecified: Secondary | ICD-10-CM | POA: Diagnosis not present

## 2019-08-03 DIAGNOSIS — Z7982 Long term (current) use of aspirin: Secondary | ICD-10-CM | POA: Diagnosis not present

## 2019-08-03 DIAGNOSIS — N390 Urinary tract infection, site not specified: Secondary | ICD-10-CM | POA: Diagnosis not present

## 2019-08-03 DIAGNOSIS — R000 Tachycardia, unspecified: Secondary | ICD-10-CM | POA: Diagnosis not present

## 2019-08-03 DIAGNOSIS — K760 Fatty (change of) liver, not elsewhere classified: Secondary | ICD-10-CM | POA: Diagnosis not present

## 2019-08-03 DIAGNOSIS — Z4789 Encounter for other orthopedic aftercare: Secondary | ICD-10-CM | POA: Diagnosis not present

## 2019-08-03 DIAGNOSIS — K219 Gastro-esophageal reflux disease without esophagitis: Secondary | ICD-10-CM | POA: Diagnosis not present

## 2019-08-03 DIAGNOSIS — Z9181 History of falling: Secondary | ICD-10-CM | POA: Diagnosis not present

## 2019-08-03 DIAGNOSIS — J45909 Unspecified asthma, uncomplicated: Secondary | ICD-10-CM | POA: Diagnosis not present

## 2019-08-03 DIAGNOSIS — E039 Hypothyroidism, unspecified: Secondary | ICD-10-CM | POA: Diagnosis not present

## 2019-08-03 DIAGNOSIS — F419 Anxiety disorder, unspecified: Secondary | ICD-10-CM | POA: Diagnosis not present

## 2019-08-03 DIAGNOSIS — D631 Anemia in chronic kidney disease: Secondary | ICD-10-CM | POA: Diagnosis not present

## 2019-08-03 DIAGNOSIS — J189 Pneumonia, unspecified organism: Secondary | ICD-10-CM | POA: Diagnosis not present

## 2019-08-03 DIAGNOSIS — I129 Hypertensive chronic kidney disease with stage 1 through stage 4 chronic kidney disease, or unspecified chronic kidney disease: Secondary | ICD-10-CM | POA: Diagnosis not present

## 2019-08-03 DIAGNOSIS — N184 Chronic kidney disease, stage 4 (severe): Secondary | ICD-10-CM | POA: Diagnosis not present

## 2019-08-03 DIAGNOSIS — Z981 Arthrodesis status: Secondary | ICD-10-CM | POA: Diagnosis not present

## 2019-08-07 DIAGNOSIS — Z4789 Encounter for other orthopedic aftercare: Secondary | ICD-10-CM | POA: Diagnosis not present

## 2019-08-07 DIAGNOSIS — N390 Urinary tract infection, site not specified: Secondary | ICD-10-CM | POA: Diagnosis not present

## 2019-08-07 DIAGNOSIS — N184 Chronic kidney disease, stage 4 (severe): Secondary | ICD-10-CM | POA: Diagnosis not present

## 2019-08-07 DIAGNOSIS — D631 Anemia in chronic kidney disease: Secondary | ICD-10-CM | POA: Diagnosis not present

## 2019-08-07 DIAGNOSIS — I129 Hypertensive chronic kidney disease with stage 1 through stage 4 chronic kidney disease, or unspecified chronic kidney disease: Secondary | ICD-10-CM | POA: Diagnosis not present

## 2019-08-07 DIAGNOSIS — J189 Pneumonia, unspecified organism: Secondary | ICD-10-CM | POA: Diagnosis not present

## 2019-08-14 DIAGNOSIS — R9389 Abnormal findings on diagnostic imaging of other specified body structures: Secondary | ICD-10-CM | POA: Diagnosis not present

## 2019-08-14 DIAGNOSIS — Z01419 Encounter for gynecological examination (general) (routine) without abnormal findings: Secondary | ICD-10-CM | POA: Diagnosis not present

## 2019-08-14 DIAGNOSIS — N950 Postmenopausal bleeding: Secondary | ICD-10-CM | POA: Diagnosis not present

## 2019-08-14 DIAGNOSIS — Z124 Encounter for screening for malignant neoplasm of cervix: Secondary | ICD-10-CM | POA: Diagnosis not present

## 2019-08-15 DIAGNOSIS — M170 Bilateral primary osteoarthritis of knee: Secondary | ICD-10-CM | POA: Diagnosis not present

## 2019-08-16 DIAGNOSIS — D631 Anemia in chronic kidney disease: Secondary | ICD-10-CM | POA: Diagnosis not present

## 2019-08-16 DIAGNOSIS — N184 Chronic kidney disease, stage 4 (severe): Secondary | ICD-10-CM | POA: Diagnosis not present

## 2019-08-16 DIAGNOSIS — Z4789 Encounter for other orthopedic aftercare: Secondary | ICD-10-CM | POA: Diagnosis not present

## 2019-08-16 DIAGNOSIS — M4316 Spondylolisthesis, lumbar region: Secondary | ICD-10-CM | POA: Diagnosis not present

## 2019-08-16 DIAGNOSIS — J189 Pneumonia, unspecified organism: Secondary | ICD-10-CM | POA: Diagnosis not present

## 2019-08-16 DIAGNOSIS — N390 Urinary tract infection, site not specified: Secondary | ICD-10-CM | POA: Diagnosis not present

## 2019-08-16 DIAGNOSIS — I129 Hypertensive chronic kidney disease with stage 1 through stage 4 chronic kidney disease, or unspecified chronic kidney disease: Secondary | ICD-10-CM | POA: Diagnosis not present

## 2019-08-20 DIAGNOSIS — N950 Postmenopausal bleeding: Secondary | ICD-10-CM | POA: Diagnosis not present

## 2019-08-22 DIAGNOSIS — M170 Bilateral primary osteoarthritis of knee: Secondary | ICD-10-CM | POA: Diagnosis not present

## 2019-08-26 DIAGNOSIS — N184 Chronic kidney disease, stage 4 (severe): Secondary | ICD-10-CM | POA: Diagnosis not present

## 2019-08-26 DIAGNOSIS — R5383 Other fatigue: Secondary | ICD-10-CM | POA: Diagnosis not present

## 2019-08-26 DIAGNOSIS — J189 Pneumonia, unspecified organism: Secondary | ICD-10-CM | POA: Diagnosis not present

## 2019-08-26 DIAGNOSIS — I129 Hypertensive chronic kidney disease with stage 1 through stage 4 chronic kidney disease, or unspecified chronic kidney disease: Secondary | ICD-10-CM | POA: Diagnosis not present

## 2019-08-26 DIAGNOSIS — N390 Urinary tract infection, site not specified: Secondary | ICD-10-CM | POA: Diagnosis not present

## 2019-08-26 DIAGNOSIS — Z4789 Encounter for other orthopedic aftercare: Secondary | ICD-10-CM | POA: Diagnosis not present

## 2019-08-26 DIAGNOSIS — D631 Anemia in chronic kidney disease: Secondary | ICD-10-CM | POA: Diagnosis not present

## 2019-08-26 DIAGNOSIS — R531 Weakness: Secondary | ICD-10-CM | POA: Diagnosis not present

## 2019-08-29 DIAGNOSIS — M170 Bilateral primary osteoarthritis of knee: Secondary | ICD-10-CM | POA: Diagnosis not present

## 2019-09-02 DIAGNOSIS — Z4789 Encounter for other orthopedic aftercare: Secondary | ICD-10-CM | POA: Diagnosis not present

## 2019-09-02 DIAGNOSIS — E039 Hypothyroidism, unspecified: Secondary | ICD-10-CM | POA: Diagnosis not present

## 2019-09-02 DIAGNOSIS — R000 Tachycardia, unspecified: Secondary | ICD-10-CM | POA: Diagnosis not present

## 2019-09-02 DIAGNOSIS — Z9181 History of falling: Secondary | ICD-10-CM | POA: Diagnosis not present

## 2019-09-02 DIAGNOSIS — I129 Hypertensive chronic kidney disease with stage 1 through stage 4 chronic kidney disease, or unspecified chronic kidney disease: Secondary | ICD-10-CM | POA: Diagnosis not present

## 2019-09-02 DIAGNOSIS — K760 Fatty (change of) liver, not elsewhere classified: Secondary | ICD-10-CM | POA: Diagnosis not present

## 2019-09-02 DIAGNOSIS — Z981 Arthrodesis status: Secondary | ICD-10-CM | POA: Diagnosis not present

## 2019-09-02 DIAGNOSIS — N390 Urinary tract infection, site not specified: Secondary | ICD-10-CM | POA: Diagnosis not present

## 2019-09-02 DIAGNOSIS — F419 Anxiety disorder, unspecified: Secondary | ICD-10-CM | POA: Diagnosis not present

## 2019-09-02 DIAGNOSIS — F329 Major depressive disorder, single episode, unspecified: Secondary | ICD-10-CM | POA: Diagnosis not present

## 2019-09-02 DIAGNOSIS — J45909 Unspecified asthma, uncomplicated: Secondary | ICD-10-CM | POA: Diagnosis not present

## 2019-09-02 DIAGNOSIS — D631 Anemia in chronic kidney disease: Secondary | ICD-10-CM | POA: Diagnosis not present

## 2019-09-02 DIAGNOSIS — Z7982 Long term (current) use of aspirin: Secondary | ICD-10-CM | POA: Diagnosis not present

## 2019-09-02 DIAGNOSIS — J189 Pneumonia, unspecified organism: Secondary | ICD-10-CM | POA: Diagnosis not present

## 2019-09-02 DIAGNOSIS — K219 Gastro-esophageal reflux disease without esophagitis: Secondary | ICD-10-CM | POA: Diagnosis not present

## 2019-09-02 DIAGNOSIS — N184 Chronic kidney disease, stage 4 (severe): Secondary | ICD-10-CM | POA: Diagnosis not present

## 2019-09-02 DIAGNOSIS — R471 Dysarthria and anarthria: Secondary | ICD-10-CM | POA: Diagnosis not present

## 2019-09-05 DIAGNOSIS — M170 Bilateral primary osteoarthritis of knee: Secondary | ICD-10-CM | POA: Diagnosis not present

## 2019-09-06 DIAGNOSIS — N390 Urinary tract infection, site not specified: Secondary | ICD-10-CM | POA: Diagnosis not present

## 2019-09-06 DIAGNOSIS — J189 Pneumonia, unspecified organism: Secondary | ICD-10-CM | POA: Diagnosis not present

## 2019-09-06 DIAGNOSIS — D631 Anemia in chronic kidney disease: Secondary | ICD-10-CM | POA: Diagnosis not present

## 2019-09-06 DIAGNOSIS — I129 Hypertensive chronic kidney disease with stage 1 through stage 4 chronic kidney disease, or unspecified chronic kidney disease: Secondary | ICD-10-CM | POA: Diagnosis not present

## 2019-09-06 DIAGNOSIS — N184 Chronic kidney disease, stage 4 (severe): Secondary | ICD-10-CM | POA: Diagnosis not present

## 2019-09-06 DIAGNOSIS — Z4789 Encounter for other orthopedic aftercare: Secondary | ICD-10-CM | POA: Diagnosis not present

## 2019-09-20 DIAGNOSIS — Z4789 Encounter for other orthopedic aftercare: Secondary | ICD-10-CM | POA: Diagnosis not present

## 2019-09-20 DIAGNOSIS — I129 Hypertensive chronic kidney disease with stage 1 through stage 4 chronic kidney disease, or unspecified chronic kidney disease: Secondary | ICD-10-CM | POA: Diagnosis not present

## 2019-09-20 DIAGNOSIS — D631 Anemia in chronic kidney disease: Secondary | ICD-10-CM | POA: Diagnosis not present

## 2019-09-20 DIAGNOSIS — N390 Urinary tract infection, site not specified: Secondary | ICD-10-CM | POA: Diagnosis not present

## 2019-09-20 DIAGNOSIS — N184 Chronic kidney disease, stage 4 (severe): Secondary | ICD-10-CM | POA: Diagnosis not present

## 2019-09-20 DIAGNOSIS — J189 Pneumonia, unspecified organism: Secondary | ICD-10-CM | POA: Diagnosis not present

## 2019-09-25 DIAGNOSIS — N390 Urinary tract infection, site not specified: Secondary | ICD-10-CM | POA: Diagnosis not present

## 2019-09-25 DIAGNOSIS — I129 Hypertensive chronic kidney disease with stage 1 through stage 4 chronic kidney disease, or unspecified chronic kidney disease: Secondary | ICD-10-CM | POA: Diagnosis not present

## 2019-09-25 DIAGNOSIS — N184 Chronic kidney disease, stage 4 (severe): Secondary | ICD-10-CM | POA: Diagnosis not present

## 2019-09-25 DIAGNOSIS — J189 Pneumonia, unspecified organism: Secondary | ICD-10-CM | POA: Diagnosis not present

## 2019-09-25 DIAGNOSIS — D631 Anemia in chronic kidney disease: Secondary | ICD-10-CM | POA: Diagnosis not present

## 2019-09-25 DIAGNOSIS — Z4789 Encounter for other orthopedic aftercare: Secondary | ICD-10-CM | POA: Diagnosis not present

## 2019-11-04 DIAGNOSIS — M25532 Pain in left wrist: Secondary | ICD-10-CM | POA: Diagnosis not present

## 2019-11-04 DIAGNOSIS — M353 Polymyalgia rheumatica: Secondary | ICD-10-CM | POA: Diagnosis not present

## 2019-11-04 DIAGNOSIS — M25519 Pain in unspecified shoulder: Secondary | ICD-10-CM | POA: Diagnosis not present

## 2019-11-04 DIAGNOSIS — M549 Dorsalgia, unspecified: Secondary | ICD-10-CM | POA: Diagnosis not present

## 2019-11-04 DIAGNOSIS — M25439 Effusion, unspecified wrist: Secondary | ICD-10-CM | POA: Diagnosis not present

## 2019-11-04 DIAGNOSIS — R768 Other specified abnormal immunological findings in serum: Secondary | ICD-10-CM | POA: Diagnosis not present

## 2019-11-04 DIAGNOSIS — M109 Gout, unspecified: Secondary | ICD-10-CM | POA: Diagnosis not present

## 2019-11-04 DIAGNOSIS — M1990 Unspecified osteoarthritis, unspecified site: Secondary | ICD-10-CM | POA: Diagnosis not present

## 2019-11-04 DIAGNOSIS — R5383 Other fatigue: Secondary | ICD-10-CM | POA: Diagnosis not present

## 2019-11-04 DIAGNOSIS — M810 Age-related osteoporosis without current pathological fracture: Secondary | ICD-10-CM | POA: Diagnosis not present

## 2019-11-04 DIAGNOSIS — M25569 Pain in unspecified knee: Secondary | ICD-10-CM | POA: Diagnosis not present

## 2019-11-04 DIAGNOSIS — N189 Chronic kidney disease, unspecified: Secondary | ICD-10-CM | POA: Diagnosis not present

## 2019-11-04 DIAGNOSIS — M79643 Pain in unspecified hand: Secondary | ICD-10-CM | POA: Diagnosis not present

## 2019-11-06 DIAGNOSIS — R300 Dysuria: Secondary | ICD-10-CM | POA: Diagnosis not present

## 2019-11-06 DIAGNOSIS — N390 Urinary tract infection, site not specified: Secondary | ICD-10-CM | POA: Diagnosis not present

## 2019-11-19 DIAGNOSIS — N3946 Mixed incontinence: Secondary | ICD-10-CM | POA: Diagnosis not present

## 2019-11-19 DIAGNOSIS — N3000 Acute cystitis without hematuria: Secondary | ICD-10-CM | POA: Diagnosis not present

## 2019-11-26 DIAGNOSIS — M1990 Unspecified osteoarthritis, unspecified site: Secondary | ICD-10-CM | POA: Diagnosis not present

## 2019-11-26 DIAGNOSIS — R768 Other specified abnormal immunological findings in serum: Secondary | ICD-10-CM | POA: Diagnosis not present

## 2019-11-26 DIAGNOSIS — M25569 Pain in unspecified knee: Secondary | ICD-10-CM | POA: Diagnosis not present

## 2019-11-26 DIAGNOSIS — M353 Polymyalgia rheumatica: Secondary | ICD-10-CM | POA: Diagnosis not present

## 2019-11-26 DIAGNOSIS — M25519 Pain in unspecified shoulder: Secondary | ICD-10-CM | POA: Diagnosis not present

## 2019-11-26 DIAGNOSIS — M25532 Pain in left wrist: Secondary | ICD-10-CM | POA: Diagnosis not present

## 2019-11-26 DIAGNOSIS — M79643 Pain in unspecified hand: Secondary | ICD-10-CM | POA: Diagnosis not present

## 2019-11-26 DIAGNOSIS — M810 Age-related osteoporosis without current pathological fracture: Secondary | ICD-10-CM | POA: Diagnosis not present

## 2019-11-26 DIAGNOSIS — M549 Dorsalgia, unspecified: Secondary | ICD-10-CM | POA: Diagnosis not present

## 2019-11-26 DIAGNOSIS — R5383 Other fatigue: Secondary | ICD-10-CM | POA: Diagnosis not present

## 2019-11-26 DIAGNOSIS — M109 Gout, unspecified: Secondary | ICD-10-CM | POA: Diagnosis not present

## 2020-01-01 DIAGNOSIS — N3000 Acute cystitis without hematuria: Secondary | ICD-10-CM | POA: Diagnosis not present

## 2020-01-01 DIAGNOSIS — N3946 Mixed incontinence: Secondary | ICD-10-CM | POA: Diagnosis not present

## 2020-01-07 DIAGNOSIS — N189 Chronic kidney disease, unspecified: Secondary | ICD-10-CM | POA: Diagnosis not present

## 2020-01-07 DIAGNOSIS — M549 Dorsalgia, unspecified: Secondary | ICD-10-CM | POA: Diagnosis not present

## 2020-01-07 DIAGNOSIS — M1990 Unspecified osteoarthritis, unspecified site: Secondary | ICD-10-CM | POA: Diagnosis not present

## 2020-01-07 DIAGNOSIS — M353 Polymyalgia rheumatica: Secondary | ICD-10-CM | POA: Diagnosis not present

## 2020-01-07 DIAGNOSIS — M25519 Pain in unspecified shoulder: Secondary | ICD-10-CM | POA: Diagnosis not present

## 2020-01-07 DIAGNOSIS — M810 Age-related osteoporosis without current pathological fracture: Secondary | ICD-10-CM | POA: Diagnosis not present

## 2020-01-07 DIAGNOSIS — M25569 Pain in unspecified knee: Secondary | ICD-10-CM | POA: Diagnosis not present

## 2020-01-07 DIAGNOSIS — M25439 Effusion, unspecified wrist: Secondary | ICD-10-CM | POA: Diagnosis not present

## 2020-01-07 DIAGNOSIS — R5383 Other fatigue: Secondary | ICD-10-CM | POA: Diagnosis not present

## 2020-01-07 DIAGNOSIS — M109 Gout, unspecified: Secondary | ICD-10-CM | POA: Diagnosis not present

## 2020-01-07 DIAGNOSIS — R768 Other specified abnormal immunological findings in serum: Secondary | ICD-10-CM | POA: Diagnosis not present

## 2020-02-10 ENCOUNTER — Other Ambulatory Visit: Payer: Self-pay

## 2020-02-10 ENCOUNTER — Encounter (HOSPITAL_COMMUNITY): Payer: Self-pay | Admitting: Emergency Medicine

## 2020-02-10 ENCOUNTER — Emergency Department (HOSPITAL_COMMUNITY): Payer: Medicare Other

## 2020-02-10 ENCOUNTER — Emergency Department (HOSPITAL_COMMUNITY)
Admission: EM | Admit: 2020-02-10 | Discharge: 2020-02-10 | Disposition: A | Discharge status: Home / Self Care | Payer: Medicare Other | Attending: Emergency Medicine | Admitting: Emergency Medicine

## 2020-02-10 DIAGNOSIS — Z8673 Personal history of transient ischemic attack (TIA), and cerebral infarction without residual deficits: Secondary | ICD-10-CM | POA: Insufficient documentation

## 2020-02-10 DIAGNOSIS — Z79899 Other long term (current) drug therapy: Secondary | ICD-10-CM | POA: Diagnosis not present

## 2020-02-10 DIAGNOSIS — I129 Hypertensive chronic kidney disease with stage 1 through stage 4 chronic kidney disease, or unspecified chronic kidney disease: Secondary | ICD-10-CM | POA: Insufficient documentation

## 2020-02-10 DIAGNOSIS — M1711 Unilateral primary osteoarthritis, right knee: Secondary | ICD-10-CM | POA: Insufficient documentation

## 2020-02-10 DIAGNOSIS — J45909 Unspecified asthma, uncomplicated: Secondary | ICD-10-CM | POA: Insufficient documentation

## 2020-02-10 DIAGNOSIS — Z7982 Long term (current) use of aspirin: Secondary | ICD-10-CM | POA: Diagnosis not present

## 2020-02-10 DIAGNOSIS — N184 Chronic kidney disease, stage 4 (severe): Secondary | ICD-10-CM | POA: Diagnosis not present

## 2020-02-10 DIAGNOSIS — E039 Hypothyroidism, unspecified: Secondary | ICD-10-CM | POA: Diagnosis not present

## 2020-02-10 DIAGNOSIS — R531 Weakness: Secondary | ICD-10-CM | POA: Diagnosis not present

## 2020-02-10 DIAGNOSIS — M25561 Pain in right knee: Secondary | ICD-10-CM | POA: Diagnosis not present

## 2020-02-10 DIAGNOSIS — R52 Pain, unspecified: Secondary | ICD-10-CM | POA: Diagnosis not present

## 2020-02-10 DIAGNOSIS — R609 Edema, unspecified: Secondary | ICD-10-CM | POA: Diagnosis not present

## 2020-02-10 DIAGNOSIS — S82141A Displaced bicondylar fracture of right tibia, initial encounter for closed fracture: Secondary | ICD-10-CM | POA: Diagnosis not present

## 2020-02-10 NOTE — ED Provider Notes (Signed)
[facility] DEPT Provider Note   CSN: [id number] Arrival date & time: 02/10/20  1258     History Chief Complaint  Patient presents with  . Knee Pain    Carolyn Robertson is a 74 y.o. female.  74yo female presents with complaint of right knee pain x1 week, history of RA.  Patient states that she has neuropathy and bad arthritis in her knees which cause her to fall frequently.  Patient states that she was not feeling weak, dizzy, lightheaded prior to fall, states that she lost her balance and fell to her side.  Patient states she has persistent pain to the medial aspect of her right knee and is unsure if this is just from arthritis.  Patient reports multiple injuries to this leg in the past, states that she hit her knee on a table several years ago resulting in multiple small fractures to the front of her knee.  Pain is worse with bearing weight, flexion extension of the knee.  No other injuries or concerns.        Past Medical History:  Diagnosis Date  . Anxiety   . Arthritis    RA  . 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 Depression   . GERD (gastroesophageal reflux disease)   . History of blood transfusion    after miscarrige  . History of kidney stones    x 2  . Hypertension   . Hypothyroidism   . Incontinent of urine    incontinent at night  . Kidney failure   . Memory loss    "after TIA"  . Pneumonia    as a child and teenager  . Renal disorder    stage IV kidney disease  . Thyroid disease   . TIA (transient ischemic attack) 06/2018    Patient Active Problem List   Diagnosis Date Noted  . Spondylolisthesis, lumbar region 02/20/2019  . TIA (transient ischemic attack) 06/25/2018  . CVA (cerebrovascular accident) ([place]) 06/24/2018    Class: Acute  . Lumbar herniated disc 04/12/2018  . Stage 4 chronic kidney disease ([place]) 02/19/2018  . Chronic venous insufficiency 02/19/2018  . Primary localized  osteoarthritis of right knee 11/14/2016    Past Surgical History:  Procedure Laterality Date  . CHOLECYSTECTOMY    . DILATION AND CURETTAGE OF UTERUS    . EYE SURGERY Right    retina surgery  . KNEE SURGERY     bilat knees  . LUMBAR LAMINECTOMY/DECOMPRESSION MICRODISCECTOMY Right 04/12/2018   Procedure: MICRODISCECTOMY LUMBAR FOUR- LUMBAR FIVE;  Surgeon: Newman Pies, MD;  Location: [facility];  Service: Neurosurgery;  Laterality: Right;  MICRODISCECTOMY LUMBAR FOUR- LUMBAR FIVE  . TONSILLECTOMY       OB History   No obstetric history on file.     No family history on file.  Social History   Tobacco Use  . Smoking status: Never Smoker  . Smokeless tobacco: Never Used  Substance Use Topics  . Alcohol use: No    Comment: rarely  . Drug use: No    Home Medications Prior to Admission medications   Medication Sig Start Date End Date Taking? Authorizing Provider  aspirin 81 MG EC tablet Take 1 tablet (81 mg total) by mouth daily. 06/27/18   Kinnie Feil, MD  cyanocobalamin (,VITAMIN B-12,) 1000 MCG/ML injection Inject 1 mL into the skin every 28 (twenty-eight) days. 05/08/19   [provider]  cyclobenzaprine (FLEXERIL) 10 MG tablet Take  1 tablet (10 mg total) by mouth 3 (three) times daily as needed for muscle spasms. Patient not taking: Reported on 04/12/2019 02/21/19   Viona Gilmore D, NP  docusate sodium (COLACE) 100 MG capsule Take 1 capsule (100 mg total) by mouth 2 (two) times daily. Patient not taking: Reported on 03/26/2019 02/21/19   Viona Gilmore D, NP  DULoxetine (CYMBALTA) 60 MG capsule Take 60 mg by mouth at bedtime.    [provider]  estrogen, conjugated,-medroxyprogesterone (PREMPRO) 0.625-2.5 MG per tablet Take 1 tablet by mouth daily.    [provider]  Febuxostat 80 MG TABS Take 40 mg by mouth at bedtime.     [provider]  furosemide (LASIX) 40 MG tablet Take 40 mg by mouth daily as needed for edema.     [provider]  HYDROcodone-acetaminophen (NORCO) 10-325 MG tablet Take 1 tablet by mouth every 4 (four) hours as needed for moderate pain. 02/21/19   Viona Gilmore D, NP  HYDROcodone-acetaminophen (NORCO/VICODIN) 5-325 MG tablet Take 1 tablet by mouth every 4 (four) hours as needed for pain. 06/14/19   [provider]  levothyroxine (SYNTHROID) 200 MCG tablet Take 200 mcg by mouth See admin instructions. Take 221mcg by mouth on Monday, Wednesday and Friday 05/28/19   [provider]  nystatin cream (MYCOSTATIN) Apply to affected area 2 times daily 04/12/19   Antonietta Breach, PA-C  pantoprazole (PROTONIX) 40 MG tablet Take 40 mg by mouth daily before breakfast. 02/22/18   [provider]  SYNTHROID 175 MCG tablet Take 175 mcg by mouth daily before breakfast. Take 164mcg by mouth on Tuesdays, Thursdays, Saturdays and Sundays 01/05/18   [provider]  trimethoprim (TRIMPEX) 100 MG tablet Take 100 mg by mouth daily. 07/17/19   [provider]    Allergies    Other, Penicillins, Sulfa antibiotics, Codeine, Hydrocodone-acetaminophen, Naproxen sodium, and Tape  Review of Systems   Review of Systems  Constitutional: Negative for fever.  Musculoskeletal: Positive for arthralgias, gait problem and joint swelling.  Skin: Negative for color change, rash and wound.  Neurological: Negative for weakness and numbness.    Physical Exam Updated Vital Signs BP (!) 148/66 (BP Location: Right Arm)   Pulse 67   Temp 98 F (36.7 C) (Oral)   Resp 16   Ht 5\' 3"  (1.6 m)   Wt 66.2 kg   LMP  (LMP Unknown)   SpO2 100%   BMI 25.86 kg/m   Physical Exam Vitals and nursing note reviewed.  Constitutional:      General: She is not in acute distress.    Appearance: She is well-developed. She is not diaphoretic.  HENT:     Head: Normocephalic and atraumatic.  Pulmonary:     Effort: Pulmonary effort is normal.  Musculoskeletal:        General: Swelling present. No  tenderness or deformity.     Right knee: Crepitus present. No bony tenderness. Decreased range of motion. No tenderness.     Comments: Held in full extension, pain with attempt at flexion. No medial or lateral joint line tenderness. Swelling likely due to arthritis, no effusion. Thready pulses to right and left DP. No pain with rotation of hips.   Skin:    General: Skin is warm and dry.     Findings: No erythema or rash.  Neurological:     Mental Status: She is alert and oriented to person, place, and time.  Psychiatric:  Behavior: Behavior normal.     ED Results / Procedures / Treatments   Labs (all labs ordered are listed, but only abnormal results are displayed) Labs Reviewed - No data to display  EKG None  Radiology CT Knee Right Wo Contrast  Result Date: 02/10/2020 CLINICAL DATA:  Tibial plateau fracture. Right knee pain and swelling after fall 1 week ago. EXAM: CT OF THE RIGHT KNEE WITHOUT CONTRAST TECHNIQUE: Multidetector CT imaging of the RIGHT knee was performed according to the standard protocol. Multiplanar CT image reconstructions were also generated. COMPARISON:  Radiograph earlier this day. FINDINGS: Bones/Joint/Cartilage No evidence of acute fracture, with particular attention to the lateral tibial plateau. There is lateral tibiofemoral joint space narrowing with peripheral spurring which is partially fragmented prominent patellofemoral spurring with mild joint space narrowing. There is spurring of the posterior femoral condyles and tibial spines. Small knee joint effusion but no lipohemarthrosis. Ligaments Suboptimally assessed by CT. No ACL fibers are visualized. Muscles and Tendons No intramuscular hematoma. No significant muscle atrophy. Soft tissues No confluent soft tissue hematoma. IMPRESSION: 1. No evidence of acute fracture of the right knee, with particular attention to the lateral tibial plateau. 2. Tricompartmental osteoarthritis, prominent in the tibiofemoral  lateral compartment. 3. Small joint effusion but no lipohemarthrosis to indicate intra-articular fracture. Electronically Signed   By: Keith Rake M.D.   On: 02/10/2020 20:24   DG Knee Complete 4 Views Right  Result Date: 02/10/2020 CLINICAL DATA:  Right knee pain and swelling after fall 1 week ago EXAM: RIGHT KNEE - COMPLETE 4+ VIEW COMPARISON:  07/23/2019 FINDINGS: Subtle cortical disruption of the lateral cortex of the lateral tibial plateau with a suspected nondisplaced intra-articular component. Small knee joint effusion. Tricompartmental osteoarthritis, most severe within the lateral compartment. IMPRESSION: 1. Suspected nondisplaced lateral tibial plateau fracture. CT could be performed for confirmation, as clinically indicated. 2. Small knee joint effusion. Electronically Signed   By: Davina Poke D.O.   On: 02/10/2020 13:58    Procedures Procedures (including critical care time)  Medications Ordered in ED Medications - No data to display  ED Course  I have reviewed the triage vital signs and the nursing notes.  Pertinent labs & imaging results that were available during my care of the patient were reviewed by me and considered in my medical decision making (see chart for details).  Clinical Course as of Feb 09 2046  Mon Feb 10, 2020  2043 74yo female with right knee pain after fall last week, pain to medial right knee, worse with flexion and bearing weight. No tenderness on exam, no erythema, no effusion, does have crepitus medial to the patella. No hip pain.  XR obtained in triage concerning for tibial plateau fracture, sent for CT which shows tricompartmental arthritis, negative for fracture.  Patient to follow up with her sports medicine provider, has a knee brace at home to wear and a wheelchair at home.    [LM]    Clinical Course User Index [LM] Roque Lias   MDM Rules/Calculators/A&P                      Final Clinical Impression(s) / ED Diagnoses  Final diagnoses:  Arthritis of knee, right    Rx / DC Orders ED Discharge Orders    None       Roque Lias 02/10/20 2047    Daleen Bo, MD 02/11/20 [phone]

## 2020-02-10 NOTE — ED Provider Notes (Signed)
  Face-to-face evaluation   History: Elderly female, with right knee pain, atraumatic.  She has history of rheumatoid arthritis currently on prednisone for.  Physical exam: Elderly, alert and cooperative.  Right knee with crepitation, with flexion.  She guards against flexion secondary to pain.  She is able to straight leg raise easily on the right.  Medical screening examination/treatment/procedure(s) were conducted as a shared visit with non-physician practitioner(s) and myself.  I personally evaluated the patient during the encounter    Daleen Bo, MD 02/11/20 [phone]

## 2020-02-10 NOTE — ED Notes (Signed)
Calls made so spouse and daughter to inform them of discharge of pt home. Voicemail boxes on mobile phone for both parties were unavailable. HIPAA compliant voicemail left on home phone of spouse.

## 2020-02-10 NOTE — ED Triage Notes (Signed)
Per EMS-last week she fell injuring her right knee-increases swelling and pain-also complaining of left rib pain-no blood thinners

## 2020-02-10 NOTE — Discharge Instructions (Addendum)
Follow up with your sports medicine provider at [facility], call to schedule an appointment. Wear your knee brace. Ice/heat to area for 20 minutes at a time.

## 2020-02-12 DIAGNOSIS — M1711 Unilateral primary osteoarthritis, right knee: Secondary | ICD-10-CM | POA: Diagnosis not present

## 2020-02-14 DIAGNOSIS — M1990 Unspecified osteoarthritis, unspecified site: Secondary | ICD-10-CM | POA: Diagnosis not present

## 2020-02-15 ENCOUNTER — Emergency Department (HOSPITAL_COMMUNITY): Payer: Medicare Other

## 2020-02-15 ENCOUNTER — Encounter (HOSPITAL_COMMUNITY): Payer: Self-pay | Admitting: Emergency Medicine

## 2020-02-15 ENCOUNTER — Inpatient Hospital Stay (HOSPITAL_COMMUNITY)
Admission: EM | Admit: 2020-02-15 | Discharge: 2020-02-18 | DRG: 884 | Disposition: A | Discharge status: Skilled Nursing Facility | Payer: Medicare Other | Attending: Family Medicine | Admitting: Family Medicine

## 2020-02-15 ENCOUNTER — Other Ambulatory Visit: Payer: Self-pay

## 2020-02-15 DIAGNOSIS — Z66 Do not resuscitate: Secondary | ICD-10-CM | POA: Diagnosis not present

## 2020-02-15 DIAGNOSIS — R5381 Other malaise: Secondary | ICD-10-CM | POA: Diagnosis present

## 2020-02-15 DIAGNOSIS — F419 Anxiety disorder, unspecified: Secondary | ICD-10-CM | POA: Diagnosis present

## 2020-02-15 DIAGNOSIS — G8929 Other chronic pain: Secondary | ICD-10-CM | POA: Diagnosis present

## 2020-02-15 DIAGNOSIS — Z79891 Long term (current) use of opiate analgesic: Secondary | ICD-10-CM

## 2020-02-15 DIAGNOSIS — N3000 Acute cystitis without hematuria: Secondary | ICD-10-CM

## 2020-02-15 DIAGNOSIS — Z87440 Personal history of urinary (tract) infections: Secondary | ICD-10-CM

## 2020-02-15 DIAGNOSIS — Z882 Allergy status to sulfonamides status: Secondary | ICD-10-CM

## 2020-02-15 DIAGNOSIS — I129 Hypertensive chronic kidney disease with stage 1 through stage 4 chronic kidney disease, or unspecified chronic kidney disease: Secondary | ICD-10-CM | POA: Diagnosis present

## 2020-02-15 DIAGNOSIS — M069 Rheumatoid arthritis, unspecified: Secondary | ICD-10-CM | POA: Diagnosis present

## 2020-02-15 DIAGNOSIS — R627 Adult failure to thrive: Secondary | ICD-10-CM | POA: Diagnosis present

## 2020-02-15 DIAGNOSIS — M1990 Unspecified osteoarthritis, unspecified site: Secondary | ICD-10-CM | POA: Diagnosis not present

## 2020-02-15 DIAGNOSIS — F0150 Vascular dementia without behavioral disturbance: Secondary | ICD-10-CM | POA: Diagnosis present

## 2020-02-15 DIAGNOSIS — Z886 Allergy status to analgesic agent status: Secondary | ICD-10-CM

## 2020-02-15 DIAGNOSIS — Z7952 Long term (current) use of systemic steroids: Secondary | ICD-10-CM

## 2020-02-15 DIAGNOSIS — Z885 Allergy status to narcotic agent status: Secondary | ICD-10-CM

## 2020-02-15 DIAGNOSIS — Z993 Dependence on wheelchair: Secondary | ICD-10-CM

## 2020-02-15 DIAGNOSIS — I499 Cardiac arrhythmia, unspecified: Secondary | ICD-10-CM | POA: Diagnosis not present

## 2020-02-15 DIAGNOSIS — Z9049 Acquired absence of other specified parts of digestive tract: Secondary | ICD-10-CM

## 2020-02-15 DIAGNOSIS — N184 Chronic kidney disease, stage 4 (severe): Secondary | ICD-10-CM | POA: Diagnosis present

## 2020-02-15 DIAGNOSIS — G459 Transient cerebral ischemic attack, unspecified: Secondary | ICD-10-CM | POA: Diagnosis not present

## 2020-02-15 DIAGNOSIS — R410 Disorientation, unspecified: Secondary | ICD-10-CM | POA: Diagnosis not present

## 2020-02-15 DIAGNOSIS — R0902 Hypoxemia: Secondary | ICD-10-CM | POA: Diagnosis not present

## 2020-02-15 DIAGNOSIS — Z20822 Contact with and (suspected) exposure to covid-19: Secondary | ICD-10-CM | POA: Diagnosis present

## 2020-02-15 DIAGNOSIS — R29810 Facial weakness: Secondary | ICD-10-CM | POA: Diagnosis not present

## 2020-02-15 DIAGNOSIS — Z8619 Personal history of other infectious and parasitic diseases: Secondary | ICD-10-CM

## 2020-02-15 DIAGNOSIS — Z87442 Personal history of urinary calculi: Secondary | ICD-10-CM

## 2020-02-15 DIAGNOSIS — Z7982 Long term (current) use of aspirin: Secondary | ICD-10-CM

## 2020-02-15 DIAGNOSIS — N390 Urinary tract infection, site not specified: Secondary | ICD-10-CM

## 2020-02-15 DIAGNOSIS — M4316 Spondylolisthesis, lumbar region: Secondary | ICD-10-CM | POA: Diagnosis present

## 2020-02-15 DIAGNOSIS — R29818 Other symptoms and signs involving the nervous system: Secondary | ICD-10-CM | POA: Diagnosis not present

## 2020-02-15 DIAGNOSIS — Z9181 History of falling: Secondary | ICD-10-CM

## 2020-02-15 DIAGNOSIS — R296 Repeated falls: Secondary | ICD-10-CM | POA: Diagnosis not present

## 2020-02-15 DIAGNOSIS — Z91048 Other nonmedicinal substance allergy status: Secondary | ICD-10-CM

## 2020-02-15 DIAGNOSIS — Z9089 Acquired absence of other organs: Secondary | ICD-10-CM

## 2020-02-15 DIAGNOSIS — R531 Weakness: Secondary | ICD-10-CM

## 2020-02-15 DIAGNOSIS — F329 Major depressive disorder, single episode, unspecified: Secondary | ICD-10-CM | POA: Diagnosis present

## 2020-02-15 DIAGNOSIS — Z8673 Personal history of transient ischemic attack (TIA), and cerebral infarction without residual deficits: Secondary | ICD-10-CM

## 2020-02-15 DIAGNOSIS — Z7401 Bed confinement status: Secondary | ICD-10-CM

## 2020-02-15 DIAGNOSIS — R54 Age-related physical debility: Secondary | ICD-10-CM | POA: Diagnosis not present

## 2020-02-15 DIAGNOSIS — Z880 Allergy status to penicillin: Secondary | ICD-10-CM

## 2020-02-15 DIAGNOSIS — N189 Chronic kidney disease, unspecified: Secondary | ICD-10-CM | POA: Diagnosis not present

## 2020-02-15 DIAGNOSIS — M109 Gout, unspecified: Secondary | ICD-10-CM | POA: Diagnosis present

## 2020-02-15 DIAGNOSIS — K219 Gastro-esophageal reflux disease without esophagitis: Secondary | ICD-10-CM | POA: Diagnosis present

## 2020-02-15 DIAGNOSIS — R030 Elevated blood-pressure reading, without diagnosis of hypertension: Secondary | ICD-10-CM

## 2020-02-15 DIAGNOSIS — I872 Venous insufficiency (chronic) (peripheral): Secondary | ICD-10-CM | POA: Diagnosis present

## 2020-02-15 DIAGNOSIS — Z79899 Other long term (current) drug therapy: Secondary | ICD-10-CM

## 2020-02-15 DIAGNOSIS — B961 Klebsiella pneumoniae [K. pneumoniae] as the cause of diseases classified elsewhere: Secondary | ICD-10-CM | POA: Diagnosis present

## 2020-02-15 DIAGNOSIS — I493 Ventricular premature depolarization: Secondary | ICD-10-CM | POA: Diagnosis present

## 2020-02-15 DIAGNOSIS — E039 Hypothyroidism, unspecified: Secondary | ICD-10-CM

## 2020-02-15 DIAGNOSIS — Z515 Encounter for palliative care: Secondary | ICD-10-CM

## 2020-02-15 HISTORY — DX: Cerebral infarction, unspecified: I63.9

## 2020-02-15 LAB — URINALYSIS, ROUTINE W REFLEX MICROSCOPIC
Bilirubin Urine: NEGATIVE
Glucose, UA: NEGATIVE mg/dL
Ketones, ur: 5 mg/dL — AB
Nitrite: NEGATIVE
Protein, ur: 100 mg/dL — AB
RBC / HPF: >50 RBC/hpf — ABNORMAL HIGH (ref 0–5)
Specific Gravity, Urine: 1.013 (ref 1.005–1.030)
WBC, UA: >50 WBC/hpf — ABNORMAL HIGH (ref 0–5)
pH: 5 (ref 5.0–8.0)

## 2020-02-15 LAB — PROTIME-INR
INR: 1.1 (ref 0.8–1.2)
Prothrombin Time: 13.7 seconds (ref 11.4–15.2)

## 2020-02-15 LAB — DIFFERENTIAL
Abs Immature Granulocytes: 0.05 10*3/uL (ref 0.00–0.07)
Basophils Absolute: 0.1 10*3/uL (ref 0.0–0.1)
Basophils Relative: 1 %
Eosinophils Absolute: 0.1 10*3/uL (ref 0.0–0.5)
Eosinophils Relative: 1 %
Immature Granulocytes: 0 %
Lymphocytes Relative: 14 %
Lymphs Abs: 1.8 10*3/uL (ref 0.7–4.0)
Monocytes Absolute: 0.8 10*3/uL (ref 0.1–1.0)
Monocytes Relative: 6 %
Neutro Abs: 9.9 10*3/uL — ABNORMAL HIGH (ref 1.7–7.7)
Neutrophils Relative %: 78 %

## 2020-02-15 LAB — COMPREHENSIVE METABOLIC PANEL
ALT: 9 U/L (ref 0–44)
AST: 15 U/L (ref 15–41)
Albumin: 3.1 g/dL — ABNORMAL LOW (ref 3.5–5.0)
Alkaline Phosphatase: 64 U/L (ref 38–126)
Anion gap: 11 (ref 5–15)
BUN: 19 mg/dL (ref 8–23)
CO2: 22 mmol/L (ref 22–32)
Calcium: 9.6 mg/dL (ref 8.9–10.3)
Chloride: 111 mmol/L (ref 98–111)
Creatinine, Ser: 2.2 mg/dL — ABNORMAL HIGH (ref 0.44–1.00)
GFR calc Af Amer: 25 mL/min — ABNORMAL LOW (ref >60)
GFR calc non Af Amer: 21 mL/min — ABNORMAL LOW (ref >60)
Glucose, Bld: 110 mg/dL — ABNORMAL HIGH (ref 70–99)
Potassium: 3.9 mmol/L (ref 3.5–5.1)
Sodium: 144 mmol/L (ref 135–145)
Total Bilirubin: 0.9 mg/dL (ref 0.3–1.2)
Total Protein: 6.1 g/dL — ABNORMAL LOW (ref 6.5–8.1)

## 2020-02-15 LAB — CBC
HCT: 44.2 % (ref 36.0–46.0)
Hemoglobin: 13 g/dL (ref 12.0–15.0)
MCH: 27 pg (ref 26.0–34.0)
MCHC: 29.4 g/dL — ABNORMAL LOW (ref 30.0–36.0)
MCV: 91.7 fL (ref 80.0–100.0)
Platelets: 461 10*3/uL — ABNORMAL HIGH (ref 150–400)
RBC: 4.82 MIL/uL (ref 3.87–5.11)
RDW: 14.3 % (ref 11.5–15.5)
WBC: 12.8 10*3/uL — ABNORMAL HIGH (ref 4.0–10.5)
nRBC: 0 % (ref 0.0–0.2)

## 2020-02-15 LAB — APTT: aPTT: 30 seconds (ref 24–36)

## 2020-02-15 MED ORDER — CIPROFLOXACIN HCL 500 MG PO TABS: 500.0000 mg | ORAL_TABLET | Freq: Once | ORAL | Status: DC

## 2020-02-15 MED ORDER — SODIUM CHLORIDE 0.9% FLUSH
3.0000 mL | Freq: Once | INTRAVENOUS | Status: AC
Start: 2020-02-15 — End: 2020-02-16
  Administered 2020-02-16: 01:00:00 3 mL via INTRAVENOUS

## 2020-02-15 MED ORDER — CIPROFLOXACIN HCL 250 MG PO TABS
250.0000 mg | ORAL_TABLET | Freq: Two times a day (BID) | ORAL | 0 refills | Status: DC
Start: 2020-02-15 — End: 2020-02-18

## 2020-02-15 NOTE — ED Notes (Signed)
Pt transported to MRI 

## 2020-02-15 NOTE — ED Notes (Signed)
Pt transported to CT ?

## 2020-02-15 NOTE — ED Provider Notes (Signed)
[facility] EMERGENCY DEPARTMENT Provider Note   CSN: [id number] Arrival date & time: 02/15/20  1449     History Chief Complaint  Patient presents with  . Transient Ischemic Attack    Carolyn Robertson is a 74 y.o. female.  Patient indicates when she awoke this AM she generally did not feel well. Symptoms acute onset, moderate, persistent, now improving. Denies specific pain or discomfort then. Patient indicates as she got up, was having some increased difficulty walking, and was having trouble speaking. Pt reports last feeling 'normal', some time last pm. She indicates since first noticing the symptoms, she continues to feel generally weak, but that her speech has improved. Pt denies one-sided numbness or weakness. No change in vision. Denies headache. No fever or chills.   The history is provided by the patient and the EMS personnel.       Past Medical History:  Diagnosis Date  . Anxiety   . Arthritis    RA  . 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 Depression   . GERD (gastroesophageal reflux disease)   . History of blood transfusion    after miscarrige  . History of kidney stones    x 2  . Hypertension   . Hypothyroidism   . Incontinent of urine    incontinent at night  . Kidney failure   . Memory loss    "after TIA"  . Pneumonia    as a child and teenager  . Renal disorder    stage IV kidney disease  . Stroke ([place])   . Thyroid disease   . TIA (transient ischemic attack) 06/2018    Patient Active Problem List   Diagnosis Date Noted  . Spondylolisthesis, lumbar region 02/20/2019  . TIA (transient ischemic attack) 06/25/2018  . CVA (cerebrovascular accident) ([place]) 06/24/2018    Class: Acute  . Lumbar herniated disc 04/12/2018  . Stage 4 chronic kidney disease ([place]) 02/19/2018  . Chronic venous insufficiency 02/19/2018  . Primary localized osteoarthritis of right knee 11/14/2016    Past Surgical History:    Procedure Laterality Date  . CHOLECYSTECTOMY    . DILATION AND CURETTAGE OF UTERUS    . EYE SURGERY Right    retina surgery  . KNEE SURGERY     bilat knees  . LUMBAR LAMINECTOMY/DECOMPRESSION MICRODISCECTOMY Right 04/12/2018   Procedure: MICRODISCECTOMY LUMBAR FOUR- LUMBAR FIVE;  Surgeon: Newman Pies, MD;  Location: [facility];  Service: Neurosurgery;  Laterality: Right;  MICRODISCECTOMY LUMBAR FOUR- LUMBAR FIVE  . TONSILLECTOMY       OB History   No obstetric history on file.     No family history on file.  Social History   Tobacco Use  . Smoking status: Never Smoker  . Smokeless tobacco: Never Used  Substance Use Topics  . Alcohol use: No    Comment: rarely  . Drug use: No    Home Medications Prior to Admission medications   Medication Sig Start Date End Date Taking? Authorizing Provider  aspirin 81 MG EC tablet Take 1 tablet (81 mg total) by mouth daily. 06/27/18   Kinnie Feil, MD  cyanocobalamin (,VITAMIN B-12,) 1000 MCG/ML injection Inject 1 mL into the skin every 28 (twenty-eight) days. 05/08/19   [provider]  cyclobenzaprine (FLEXERIL) 10 MG tablet Take 1 tablet (10 mg total) by mouth 3 (three) times daily as needed for muscle spasms. Patient not taking: Reported on 04/12/2019 02/21/19   Viona Gilmore  D, NP  docusate sodium (COLACE) 100 MG capsule Take 1 capsule (100 mg total) by mouth 2 (two) times daily. Patient not taking: Reported on 03/26/2019 02/21/19   Viona Gilmore D, NP  DULoxetine (CYMBALTA) 60 MG capsule Take 60 mg by mouth at bedtime.    [provider]  estrogen, conjugated,-medroxyprogesterone (PREMPRO) 0.625-2.5 MG per tablet Take 1 tablet by mouth daily.    [provider]  Febuxostat 80 MG TABS Take 40 mg by mouth at bedtime.     [provider]  furosemide (LASIX) 40 MG tablet Take 40 mg by mouth daily as needed for edema.     [provider]  HYDROcodone-acetaminophen (NORCO) 10-325 MG tablet Take  1 tablet by mouth every 4 (four) hours as needed for moderate pain. 02/21/19   Viona Gilmore D, NP  HYDROcodone-acetaminophen (NORCO/VICODIN) 5-325 MG tablet Take 1 tablet by mouth every 4 (four) hours as needed for pain. 06/14/19   [provider]  levothyroxine (SYNTHROID) 200 MCG tablet Take 200 mcg by mouth See admin instructions. Take 269mcg by mouth on Monday, Wednesday and Friday 05/28/19   [provider]  nystatin cream (MYCOSTATIN) Apply to affected area 2 times daily 04/12/19   Antonietta Breach, PA-C  pantoprazole (PROTONIX) 40 MG tablet Take 40 mg by mouth daily before breakfast. 02/22/18   [provider]  SYNTHROID 175 MCG tablet Take 175 mcg by mouth daily before breakfast. Take 15mcg by mouth on Tuesdays, Thursdays, Saturdays and Sundays 01/05/18   [provider]  trimethoprim (TRIMPEX) 100 MG tablet Take 100 mg by mouth daily. 07/17/19   [provider]    Allergies    Other, Penicillins, Sulfa antibiotics, Codeine, Hydrocodone-acetaminophen, Naproxen sodium, and Tape  Review of Systems   Review of Systems  Constitutional: Negative for chills and fever.  HENT: Negative for sore throat and trouble swallowing.   Eyes: Negative for visual disturbance.  Respiratory: Negative for shortness of breath.   Cardiovascular: Negative for chest pain.  Gastrointestinal: Negative for abdominal pain and vomiting.  Genitourinary: Negative for dysuria and flank pain.  Musculoskeletal: Negative for back pain and neck pain.  Skin: Negative for rash.  Neurological: Positive for speech difficulty and weakness. Negative for headaches.  Hematological: Does not bruise/bleed easily.  Psychiatric/Behavioral: Negative for agitation.    Physical Exam Updated Vital Signs BP (!) 165/110 (BP Location: Right Arm)   Pulse 86   Temp 98.2 F (36.8 C) (Oral)   Resp 16   LMP  (LMP Unknown)   SpO2 97%   Physical Exam Vitals and nursing note reviewed.   Constitutional:      Appearance: Normal appearance. She is well-developed.  HENT:     Head: Atraumatic.     Nose: Nose normal.     Mouth/Throat:     Mouth: Mucous membranes are moist.  Eyes:     General: No scleral icterus.    Conjunctiva/sclera: Conjunctivae normal.     Pupils: Pupils are equal, round, and reactive to light.  Neck:     Vascular: No carotid bruit.     Trachea: No tracheal deviation.  Cardiovascular:     Rate and Rhythm: Normal rate and regular rhythm.     Pulses: Normal pulses.     Heart sounds: Normal heart sounds. No murmur. No friction rub. No gallop.   Pulmonary:     Effort: Pulmonary effort is normal. No respiratory distress.     Breath sounds: Normal breath sounds.  Abdominal:  General: Bowel sounds are normal. There is no distension.     Palpations: Abdomen is soft.     Tenderness: There is no abdominal tenderness. There is no guarding.  Genitourinary:    Comments: No cva tenderness.  Musculoskeletal:        General: No swelling or tenderness.     Cervical back: Normal range of motion and neck supple. No rigidity. No muscular tenderness.  Skin:    General: Skin is warm and dry.     Findings: No rash.  Neurological:     Mental Status: She is alert.     Comments: Alert, speech is slight slow, but not grossly dysarthric, and patient appears to be able to convey her thoughts clearly. Patient appears generally weak, motor 4/5 bil, without lateralizing weakness or pronator drift.   Psychiatric:        Mood and Affect: Mood normal.     ED Results / Procedures / Treatments   Labs (all labs ordered are listed, but only abnormal results are displayed) Results for orders placed or performed during the hospital encounter of 02/15/20  Protime-INR  Result Value Ref Range   Prothrombin Time 13.7 11.4 - 15.2 seconds   INR 1.1 0.8 - 1.2  APTT  Result Value Ref Range   aPTT 30 24 - 36 seconds  CBC  Result Value Ref Range   WBC 12.8 (H) 4.0 - 10.5 K/uL    RBC 4.82 3.87 - 5.11 MIL/uL   Hemoglobin 13.0 12.0 - 15.0 g/dL   HCT 44.2 36.0 - 46.0 %   MCV 91.7 80.0 - 100.0 fL   MCH 27.0 26.0 - 34.0 pg   MCHC 29.4 (L) 30.0 - 36.0 g/dL   RDW 14.3 11.5 - 15.5 %   Platelets 461 (H) 150 - 400 K/uL   nRBC 0.0 0.0 - 0.2 %  Differential  Result Value Ref Range   Neutrophils Relative % 78 %   Neutro Abs 9.9 (H) 1.7 - 7.7 K/uL   Lymphocytes Relative 14 %   Lymphs Abs 1.8 0.7 - 4.0 K/uL   Monocytes Relative 6 %   Monocytes Absolute 0.8 0.1 - 1.0 K/uL   Eosinophils Relative 1 %   Eosinophils Absolute 0.1 0.0 - 0.5 K/uL   Basophils Relative 1 %   Basophils Absolute 0.1 0.0 - 0.1 K/uL   Immature Granulocytes 0 %   Abs Immature Granulocytes 0.05 0.00 - 0.07 K/uL  Comprehensive metabolic panel  Result Value Ref Range   Sodium 144 135 - 145 mmol/L   Potassium 3.9 3.5 - 5.1 mmol/L   Chloride 111 98 - 111 mmol/L   CO2 22 22 - 32 mmol/L   Glucose, Bld 110 (H) 70 - 99 mg/dL   BUN 19 8 - 23 mg/dL   Creatinine, Ser 2.20 (H) 0.44 - 1.00 mg/dL   Calcium 9.6 8.9 - 10.3 mg/dL   Total Protein 6.1 (L) 6.5 - 8.1 g/dL   Albumin 3.1 (L) 3.5 - 5.0 g/dL   AST 15 15 - 41 U/L   ALT 9 0 - 44 U/L   Alkaline Phosphatase 64 38 - 126 U/L   Total Bilirubin 0.9 0.3 - 1.2 mg/dL   GFR calc non Af Amer 21 (L) >60 mL/min   GFR calc Af Amer 25 (L) >60 mL/min   Anion gap 11 5 - 15   CT Knee Right Wo Contrast  Result Date: 02/10/2020 CLINICAL DATA:  Tibial plateau fracture. Right knee pain and swelling  after fall 1 week ago. EXAM: CT OF THE RIGHT KNEE WITHOUT CONTRAST TECHNIQUE: Multidetector CT imaging of the RIGHT knee was performed according to the standard protocol. Multiplanar CT image reconstructions were also generated. COMPARISON:  Radiograph earlier this day. FINDINGS: Bones/Joint/Cartilage No evidence of acute fracture, with particular attention to the lateral tibial plateau. There is lateral tibiofemoral joint space narrowing with peripheral spurring which is  partially fragmented prominent patellofemoral spurring with mild joint space narrowing. There is spurring of the posterior femoral condyles and tibial spines. Small knee joint effusion but no lipohemarthrosis. Ligaments Suboptimally assessed by CT. No ACL fibers are visualized. Muscles and Tendons No intramuscular hematoma. No significant muscle atrophy. Soft tissues No confluent soft tissue hematoma. IMPRESSION: 1. No evidence of acute fracture of the right knee, with particular attention to the lateral tibial plateau. 2. Tricompartmental osteoarthritis, prominent in the tibiofemoral lateral compartment. 3. Small joint effusion but no lipohemarthrosis to indicate intra-articular fracture. Electronically Signed   By: Keith Rake M.D.   On: 02/10/2020 20:24   DG Knee Complete 4 Views Right  Result Date: 02/10/2020 CLINICAL DATA:  Right knee pain and swelling after fall 1 week ago EXAM: RIGHT KNEE - COMPLETE 4+ VIEW COMPARISON:  07/23/2019 FINDINGS: Subtle cortical disruption of the lateral cortex of the lateral tibial plateau with a suspected nondisplaced intra-articular component. Small knee joint effusion. Tricompartmental osteoarthritis, most severe within the lateral compartment. IMPRESSION: 1. Suspected nondisplaced lateral tibial plateau fracture. CT could be performed for confirmation, as clinically indicated. 2. Small knee joint effusion. Electronically Signed   By: Davina Poke D.O.   On: 02/10/2020 13:58      EKG EKG Interpretation  Date/Time:  Saturday Feb 15 2020 14:55:06 EDT Ventricular Rate:  86 PR Interval:  128 QRS Duration: 56 QT Interval:  420 QTC Calculation: 502 R Axis:   27 Text Interpretation: Sinus rhythm with Premature atrial complexes Nonspecific T wave abnormality Confirmed by Lajean Saver [phone]) on 02/15/2020 5:39:53 PM   Radiology CT HEAD WO CONTRAST  Result Date: 02/15/2020 CLINICAL DATA:  Transient ischemic attack. Facial droop. Arm drift. History of  stroke. EXAM: CT HEAD WITHOUT CONTRAST TECHNIQUE: Contiguous axial images were obtained from the base of the skull through the vertex without intravenous contrast. COMPARISON:  07/23/2019 FINDINGS: Brain: No evidence of acute infarction, hemorrhage, hydrocephalus, extra-axial collection or mass lesion/mass effect. Extensive atrophy and chronic microvascular ischemic changes are again noted. Vascular: No hyperdense vessel or unexpected calcification. Skull: Normal. Negative for fracture or focal lesion. Sinuses/Orbits: No acute finding. Other: None. IMPRESSION: 1. No acute intracranial abnormality. 2. Extensive atrophy and chronic microvascular ischemic changes. Electronically Signed   By: Constance Holster M.D.   On: 02/15/2020 18:54   MR BRAIN WO CONTRAST  Result Date: 02/15/2020 CLINICAL DATA:  Acute neuro deficit.  Facial droop and arm drift. EXAM: MRI HEAD WITHOUT CONTRAST TECHNIQUE: Multiplanar, multiecho pulse sequences of the brain and surrounding structures were obtained without intravenous contrast. COMPARISON:  CT head 02/15/2020.  MRI head 03/26/2019. FINDINGS: Brain: Negative for acute infarct. Moderate to advanced atrophy. Chronic microvascular ischemic changes throughout the white matter and pons. Small chronic infarcts in the cerebellum bilaterally. Negative for hemorrhage or mass. Vascular: Normal arterial flow voids. Skull and upper cervical spine: No focal skeletal lesion. Sinuses/Orbits: Mucosal edema in the paranasal sinuses most notably in the sphenoid sinus. Bilateral cataract extraction Other: None IMPRESSION: Negative for acute infarct Stable changes of atrophy and chronic ischemia. Electronically Signed   By: Juanda Crumble  Carlis Abbott M.D.   On: 02/15/2020 20:36    Procedures Procedures (including critical care time)  Medications Ordered in ED Medications  sodium chloride flush (NS) 0.9 % injection 3 mL (has no administration in time range)    ED Course  I have reviewed the triage vital  signs and the nursing notes.  Pertinent labs & imaging results that were available during my care of the patient were reviewed by me and considered in my medical decision making (see chart for details).    MDM Rules/Calculators/A&P                      Iv ns. Stat labs. Ct.  Reviewed nursing notes and prior charts for additional history.  Pt with 2 prior workups for similar sounding symptoms, MRI then neg for acute cva.   Initial labs reviewed/interpreted by me - wbc mildly elev. hgb normal. Chem normal, cr elev c/w prior.   CT reviewed/interpreted by me - no hem.  MRI reviewed/interpreted by me - no acute cva.   Recheck pt, no specific c/o, denies pain.   Po fluids, food. Ambulate in hall w assist.   UA pending.   2120 signed out to Dr Wilson Singer to check ua when back, and dispo pt appropriately.    Final Clinical Impression(s) / ED Diagnoses Final diagnoses:  None    Rx / DC Orders ED Discharge Orders    None       Lajean Saver, MD 02/15/20 2124

## 2020-02-15 NOTE — ED Triage Notes (Signed)
Pt to triage via [facility] from home.  EMS called out for a stroke.  Hx of previous stroke x 2.  Husband reported facial droop and arm drift.  On EMS arrival pt didn't observe facial droop and arm drift.  Pt alert and oriented to person, place, and event.  Husband reports LKW 2pm and symptoms lasted approx 15 min.CBG 143.

## 2020-02-15 NOTE — ED Notes (Signed)
This user attempted to ambulate pt with assistance but was not able. Pt was able to stand without any apparent pain or discomfort. However, pt would not take steps forward and sat back down after being assisted to a standing position.

## 2020-02-16 DIAGNOSIS — R627 Adult failure to thrive: Secondary | ICD-10-CM | POA: Diagnosis present

## 2020-02-16 DIAGNOSIS — R030 Elevated blood-pressure reading, without diagnosis of hypertension: Secondary | ICD-10-CM

## 2020-02-16 DIAGNOSIS — R5381 Other malaise: Secondary | ICD-10-CM | POA: Diagnosis present

## 2020-02-16 DIAGNOSIS — I493 Ventricular premature depolarization: Secondary | ICD-10-CM | POA: Diagnosis present

## 2020-02-16 DIAGNOSIS — R278 Other lack of coordination: Secondary | ICD-10-CM | POA: Diagnosis not present

## 2020-02-16 DIAGNOSIS — I499 Cardiac arrhythmia, unspecified: Secondary | ICD-10-CM | POA: Diagnosis not present

## 2020-02-16 DIAGNOSIS — M069 Rheumatoid arthritis, unspecified: Secondary | ICD-10-CM | POA: Diagnosis present

## 2020-02-16 DIAGNOSIS — F329 Major depressive disorder, single episode, unspecified: Secondary | ICD-10-CM | POA: Diagnosis present

## 2020-02-16 DIAGNOSIS — R54 Age-related physical debility: Secondary | ICD-10-CM | POA: Diagnosis present

## 2020-02-16 DIAGNOSIS — G894 Chronic pain syndrome: Secondary | ICD-10-CM | POA: Diagnosis not present

## 2020-02-16 DIAGNOSIS — R531 Weakness: Secondary | ICD-10-CM | POA: Diagnosis not present

## 2020-02-16 DIAGNOSIS — M2550 Pain in unspecified joint: Secondary | ICD-10-CM | POA: Diagnosis not present

## 2020-02-16 DIAGNOSIS — Z515 Encounter for palliative care: Secondary | ICD-10-CM | POA: Diagnosis not present

## 2020-02-16 DIAGNOSIS — R52 Pain, unspecified: Secondary | ICD-10-CM | POA: Diagnosis not present

## 2020-02-16 DIAGNOSIS — K219 Gastro-esophageal reflux disease without esophagitis: Secondary | ICD-10-CM | POA: Diagnosis present

## 2020-02-16 DIAGNOSIS — N3000 Acute cystitis without hematuria: Secondary | ICD-10-CM

## 2020-02-16 DIAGNOSIS — Z20822 Contact with and (suspected) exposure to covid-19: Secondary | ICD-10-CM | POA: Diagnosis present

## 2020-02-16 DIAGNOSIS — R4182 Altered mental status, unspecified: Secondary | ICD-10-CM | POA: Diagnosis not present

## 2020-02-16 DIAGNOSIS — E039 Hypothyroidism, unspecified: Secondary | ICD-10-CM | POA: Diagnosis present

## 2020-02-16 DIAGNOSIS — Z7401 Bed confinement status: Secondary | ICD-10-CM | POA: Diagnosis not present

## 2020-02-16 DIAGNOSIS — M6259 Muscle wasting and atrophy, not elsewhere classified, multiple sites: Secondary | ICD-10-CM | POA: Diagnosis not present

## 2020-02-16 DIAGNOSIS — N184 Chronic kidney disease, stage 4 (severe): Secondary | ICD-10-CM | POA: Diagnosis present

## 2020-02-16 DIAGNOSIS — G8929 Other chronic pain: Secondary | ICD-10-CM | POA: Diagnosis present

## 2020-02-16 DIAGNOSIS — M4316 Spondylolisthesis, lumbar region: Secondary | ICD-10-CM | POA: Diagnosis present

## 2020-02-16 DIAGNOSIS — R1312 Dysphagia, oropharyngeal phase: Secondary | ICD-10-CM | POA: Diagnosis not present

## 2020-02-16 DIAGNOSIS — N390 Urinary tract infection, site not specified: Secondary | ICD-10-CM | POA: Diagnosis not present

## 2020-02-16 DIAGNOSIS — R29898 Other symptoms and signs involving the musculoskeletal system: Secondary | ICD-10-CM | POA: Diagnosis not present

## 2020-02-16 DIAGNOSIS — M109 Gout, unspecified: Secondary | ICD-10-CM | POA: Diagnosis present

## 2020-02-16 DIAGNOSIS — B961 Klebsiella pneumoniae [K. pneumoniae] as the cause of diseases classified elsewhere: Secondary | ICD-10-CM | POA: Diagnosis present

## 2020-02-16 DIAGNOSIS — M1990 Unspecified osteoarthritis, unspecified site: Secondary | ICD-10-CM | POA: Diagnosis present

## 2020-02-16 DIAGNOSIS — Z66 Do not resuscitate: Secondary | ICD-10-CM | POA: Diagnosis not present

## 2020-02-16 DIAGNOSIS — F419 Anxiety disorder, unspecified: Secondary | ICD-10-CM | POA: Diagnosis present

## 2020-02-16 DIAGNOSIS — R2681 Unsteadiness on feet: Secondary | ICD-10-CM | POA: Diagnosis not present

## 2020-02-16 DIAGNOSIS — R296 Repeated falls: Secondary | ICD-10-CM | POA: Diagnosis present

## 2020-02-16 DIAGNOSIS — Z993 Dependence on wheelchair: Secondary | ICD-10-CM | POA: Diagnosis not present

## 2020-02-16 DIAGNOSIS — R41841 Cognitive communication deficit: Secondary | ICD-10-CM | POA: Diagnosis not present

## 2020-02-16 DIAGNOSIS — I872 Venous insufficiency (chronic) (peripheral): Secondary | ICD-10-CM | POA: Diagnosis present

## 2020-02-16 DIAGNOSIS — M6281 Muscle weakness (generalized): Secondary | ICD-10-CM | POA: Diagnosis not present

## 2020-02-16 DIAGNOSIS — F0150 Vascular dementia without behavioral disturbance: Secondary | ICD-10-CM | POA: Diagnosis present

## 2020-02-16 DIAGNOSIS — M1711 Unilateral primary osteoarthritis, right knee: Secondary | ICD-10-CM | POA: Diagnosis not present

## 2020-02-16 DIAGNOSIS — I129 Hypertensive chronic kidney disease with stage 1 through stage 4 chronic kidney disease, or unspecified chronic kidney disease: Secondary | ICD-10-CM | POA: Diagnosis present

## 2020-02-16 DIAGNOSIS — R2689 Other abnormalities of gait and mobility: Secondary | ICD-10-CM | POA: Diagnosis not present

## 2020-02-16 LAB — COMPREHENSIVE METABOLIC PANEL
ALT: 10 U/L (ref 0–44)
AST: 17 U/L (ref 15–41)
Albumin: 3 g/dL — ABNORMAL LOW (ref 3.5–5.0)
Alkaline Phosphatase: 60 U/L (ref 38–126)
Anion gap: 11 (ref 5–15)
BUN: 20 mg/dL (ref 8–23)
CO2: 20 mmol/L — ABNORMAL LOW (ref 22–32)
Calcium: 9.1 mg/dL (ref 8.9–10.3)
Chloride: 111 mmol/L (ref 98–111)
Creatinine, Ser: 2.06 mg/dL — ABNORMAL HIGH (ref 0.44–1.00)
GFR calc Af Amer: 27 mL/min — ABNORMAL LOW (ref >60)
GFR calc non Af Amer: 23 mL/min — ABNORMAL LOW (ref >60)
Glucose, Bld: 94 mg/dL (ref 70–99)
Potassium: 4.2 mmol/L (ref 3.5–5.1)
Sodium: 142 mmol/L (ref 135–145)
Total Bilirubin: 0.7 mg/dL (ref 0.3–1.2)
Total Protein: 6 g/dL — ABNORMAL LOW (ref 6.5–8.1)

## 2020-02-16 LAB — CBC
HCT: 41.9 % (ref 36.0–46.0)
Hemoglobin: 12.5 g/dL (ref 12.0–15.0)
MCH: 26.9 pg (ref 26.0–34.0)
MCHC: 29.8 g/dL — ABNORMAL LOW (ref 30.0–36.0)
MCV: 90.3 fL (ref 80.0–100.0)
Platelets: 435 10*3/uL — ABNORMAL HIGH (ref 150–400)
RBC: 4.64 MIL/uL (ref 3.87–5.11)
RDW: 14.4 % (ref 11.5–15.5)
WBC: 13.3 10*3/uL — ABNORMAL HIGH (ref 4.0–10.5)
nRBC: 0 % (ref 0.0–0.2)

## 2020-02-16 LAB — RESPIRATORY PANEL BY RT PCR (FLU A&B, COVID)
Influenza A by PCR: NEGATIVE
Influenza B by PCR: NEGATIVE
SARS Coronavirus 2 by RT PCR: NEGATIVE

## 2020-02-16 LAB — T4, FREE: Free T4: 0.86 ng/dL (ref 0.61–1.12)

## 2020-02-16 LAB — TSH: TSH: 6.616 u[IU]/mL — ABNORMAL HIGH (ref 0.350–4.500)

## 2020-02-16 MED ORDER — FUROSEMIDE 40 MG PO TABS: 40.0000 mg | ORAL_TABLET | Freq: Every day | ORAL | Status: DC | PRN

## 2020-02-16 MED ORDER — AMLODIPINE BESYLATE 5 MG PO TABS
5.0000 mg | ORAL_TABLET | Freq: Every day | ORAL | Status: DC
  Administered 2020-02-16 – 2020-02-18 (×3): 5 mg via ORAL
  Filled 2020-02-16 (×3): qty 1

## 2020-02-16 MED ORDER — ENSURE ENLIVE PO LIQD
237.0000 mL | Freq: Two times a day (BID) | ORAL | Status: DC
  Administered 2020-02-17: 237 mL via ORAL

## 2020-02-16 MED ORDER — PREDNISONE 5 MG PO TABS
5.0000 mg | ORAL_TABLET | Freq: Two times a day (BID) | ORAL | Status: DC
  Administered 2020-02-16 – 2020-02-18 (×6): 5 mg via ORAL
  Filled 2020-02-16 (×6): qty 1

## 2020-02-16 MED ORDER — PANTOPRAZOLE SODIUM 40 MG PO TBEC
40.0000 mg | DELAYED_RELEASE_TABLET | Freq: Every day | ORAL | Status: DC
  Administered 2020-02-17 – 2020-02-18 (×2): 40 mg via ORAL
  Filled 2020-02-16 (×2): qty 1

## 2020-02-16 MED ORDER — SODIUM CHLORIDE 0.9 % IV SOLN
1.0000 g | INTRAVENOUS | Status: AC
  Administered 2020-02-16 – 2020-02-17 (×2): 1 g via INTRAVENOUS
  Filled 2020-02-16 (×2): qty 1

## 2020-02-16 MED ORDER — ASPIRIN EC 81 MG PO TBEC
81.0000 mg | DELAYED_RELEASE_TABLET | Freq: Every day | ORAL | Status: DC
  Administered 2020-02-16 – 2020-02-18 (×3): 81 mg via ORAL
  Filled 2020-02-16 (×3): qty 1

## 2020-02-16 MED ORDER — SODIUM CHLORIDE 0.9 % IV SOLN
1.0000 g | Freq: Once | INTRAVENOUS | Status: AC
  Administered 2020-02-16: 1 g via INTRAVENOUS
  Filled 2020-02-16: qty 10

## 2020-02-16 MED ORDER — HEPARIN SODIUM (PORCINE) 5000 UNIT/ML IJ SOLN
5000.0000 [IU] | Freq: Three times a day (TID) | INTRAMUSCULAR | Status: DC
  Administered 2020-02-16 – 2020-02-18 (×7): 5000 [IU] via SUBCUTANEOUS
  Filled 2020-02-16 (×7): qty 1

## 2020-02-16 MED ORDER — LEVOTHYROXINE SODIUM 100 MCG PO TABS
200.0000 ug | ORAL_TABLET | ORAL | Status: DC
  Administered 2020-02-17: 200 ug via ORAL
  Filled 2020-02-16 (×2): qty 2

## 2020-02-16 MED ORDER — LEVOTHYROXINE SODIUM 75 MCG PO TABS
175.0000 ug | ORAL_TABLET | ORAL | Status: DC
  Administered 2020-02-18: 175 ug via ORAL

## 2020-02-16 MED ORDER — FEBUXOSTAT 40 MG PO TABS
40.0000 mg | ORAL_TABLET | Freq: Every day | ORAL | Status: DC
  Administered 2020-02-16 – 2020-02-17 (×2): 40 mg via ORAL
  Filled 2020-02-16 (×3): qty 1

## 2020-02-16 MED ORDER — LEVOTHYROXINE SODIUM 100 MCG PO TABS: 200.0000 ug | ORAL_TABLET | ORAL | Status: DC

## 2020-02-16 MED ORDER — ASPIRIN 81 MG PO CHEW
CHEWABLE_TABLET | ORAL | Status: AC
  Filled 2020-02-16: qty 1

## 2020-02-16 MED ORDER — LEVOTHYROXINE SODIUM 75 MCG PO TABS: 175.0000 ug | ORAL_TABLET | Freq: Every day | ORAL | Status: DC

## 2020-02-16 NOTE — ED Notes (Signed)
Pt has passed swallow screen 5/1- will check with MD re: diet order

## 2020-02-16 NOTE — Progress Notes (Signed)
02/16/2020 Patient was transfer from the emergency room to [facility] at 1705. Patient is alert, oriented to person and place, but not aware of time and situation. Patient skin assess she has bruises on bilateral arms, discoloration on lower legs and sacrum pink, but blanches. [facility] RN.

## 2020-02-16 NOTE — ED Notes (Signed)
Lunch Tray Ordered @ 4650.

## 2020-02-16 NOTE — ED Notes (Signed)
Report given to RN [facility] Rm 13

## 2020-02-16 NOTE — H&P (Signed)
[facility] History and Physical  Carolyn Robertson SJG:[id number] DOB: 1946-08-14 DOA: 02/15/2020  Referring EDP: Ward PCP: Burnard Bunting, MD   Chief Complaint: Falls, Confusion, Debility  HPI: Carolyn Robertson is a 74 y.o. female with PMH of CVA, hypothyroidism, CKD who was brought to ER with concerns for TIA and ultimately admitted for debility and UTI.  Patient was brought to the ER by EMS supposedly after she went to the bathroom this morning and was unable to get up. Patient also reports several falls that started about 3 weeks ago per her; unsure how many or able to provide any more details. Per initial EDP: she woke up this morning and did not feel well; did have difficulty walking and trouble speaking; last felt normal evening of 4/30; generally weak but that speech improved now. Now patient denies any specific complaints or concerns. She thought her husband was waiting in the lobby for her although she has been here almost 10 hours. Denies headache, dizziness, fever, chills, cough, SOB, chest pain, abdominal pain, nausea, vomiting, diarrhea, constipation, dysuria, hematuria, hematochezia, melena, difficulty moving arms/legs, speech difficulty, trouble eating, confusion or any other complaints.  In the ED: Vitals stable. BP elevated. Labs remarkable for PTT and INR WNL. WBC 12.8, Cr 2.2. UA with large leuks, many bacteria, > 50 RBC/WBC. CT Head:  1. No acute intracranial abnormality. 2. Extensive atrophy and chronic microvascular ischemic changes.  MR Brain: Negative for acute infarct Stable changes of atrophy and chronic ischemia.  Patient was initially set for discharge home. She was unable to ambulate even with assistance and multiple attempts to contact patient's husband were unsuccessful. Second EDP spoke with patient's daughter in [place] who was very concerned about discharge. Reported a declined over last 1-2 months with several falls and that her father can no longer  take care of her.   Review of Systems:  All other systems negative unless noted above in HPI.   Past Medical History:  Diagnosis Date  . Anxiety   . Arthritis    RA  . 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 Depression   . GERD (gastroesophageal reflux disease)   . History of blood transfusion    after miscarrige  . History of kidney stones    x 2  . Hypertension   . Hypothyroidism   . Incontinent of urine    incontinent at night  . Kidney failure   . Memory loss    "after TIA"  . Pneumonia    as a child and teenager  . Renal disorder    stage IV kidney disease  . Stroke ([place])   . Thyroid disease   . TIA (transient ischemic attack) 06/2018   Past Surgical History:  Procedure Laterality Date  . CHOLECYSTECTOMY    . DILATION AND CURETTAGE OF UTERUS    . EYE SURGERY Right    retina surgery  . KNEE SURGERY     bilat knees  . LUMBAR LAMINECTOMY/DECOMPRESSION MICRODISCECTOMY Right 04/12/2018   Procedure: MICRODISCECTOMY LUMBAR FOUR- LUMBAR FIVE;  Surgeon: Newman Pies, MD;  Location: [facility];  Service: Neurosurgery;  Laterality: Right;  MICRODISCECTOMY LUMBAR FOUR- LUMBAR FIVE  . TONSILLECTOMY     Social History:  reports that she has never smoked. She has never used smokeless tobacco. She reports that she does not drink alcohol or use drugs.  Allergies  Allergen Reactions  . Other Swelling    A bandage/gauze that was used on lower  extremity wound at [facility] health, unsure type- Pt states her foot turned red and swelled up  . Penicillins Swelling and Rash    Has patient had a PCN reaction causing immediate rash, facial/tongue/throat swelling, SOB or lightheadedness with hypotension: Unknown Has patient had a PCN reaction causing severe rash involving mucus membranes or skin necrosis: Unknown Has patient had a PCN reaction that required hospitalization: No Has patient had a PCN reaction occurring within the last 10 years: No If all of the above  answers are "NO", then may proceed with Cephalosporin use.   . Sulfa Antibiotics Itching and Swelling  . Codeine Nausea Only  . Hydrocodone-Acetaminophen Other (See Comments)    Causes hallucinations  . Naproxen Sodium Swelling and Other (See Comments)    (ALEVE) Red Man Syndrome  . Tape Rash and Other (See Comments)    BURNS SKIN --PAPER TAPE IS OKAY    No family history on file. Unable to obtain as no family present and patient unsure.  Prior to Admission medications   Medication Sig Start Date End Date Taking? Authorizing Provider  aspirin 81 MG EC tablet Take 1 tablet (81 mg total) by mouth daily. 06/27/18   Kinnie Feil, MD  ciprofloxacin (CIPRO) 250 MG tablet Take 1 tablet (250 mg total) by mouth every 12 (twelve) hours. 02/15/20   Virgel Manifold, MD  cyanocobalamin (,VITAMIN B-12,) 1000 MCG/ML injection Inject 1 mL into the skin every 28 (twenty-eight) days. 05/08/19   [provider]  cyclobenzaprine (FLEXERIL) 10 MG tablet Take 1 tablet (10 mg total) by mouth 3 (three) times daily as needed for muscle spasms. Patient not taking: Reported on 04/12/2019 02/21/19   Viona Gilmore D, NP  docusate sodium (COLACE) 100 MG capsule Take 1 capsule (100 mg total) by mouth 2 (two) times daily. Patient not taking: Reported on 03/26/2019 02/21/19   Viona Gilmore D, NP  DULoxetine (CYMBALTA) 60 MG capsule Take 60 mg by mouth at bedtime.    [provider]  estrogen, conjugated,-medroxyprogesterone (PREMPRO) 0.625-2.5 MG per tablet Take 1 tablet by mouth daily.    [provider]  Febuxostat 80 MG TABS Take 40 mg by mouth at bedtime.     [provider]  furosemide (LASIX) 40 MG tablet Take 40 mg by mouth daily as needed for edema.     [provider]  HYDROcodone-acetaminophen (NORCO) 10-325 MG tablet Take 1 tablet by mouth every 4 (four) hours as needed for moderate pain. 02/21/19   Viona Gilmore D, NP  HYDROcodone-acetaminophen (NORCO/VICODIN) 5-325  MG tablet Take 1 tablet by mouth every 4 (four) hours as needed for pain. 06/14/19   [provider]  levothyroxine (SYNTHROID) 200 MCG tablet Take 200 mcg by mouth See admin instructions. Take 286mcg by mouth on Monday, Wednesday and Friday 05/28/19   [provider]  nystatin cream (MYCOSTATIN) Apply to affected area 2 times daily 04/12/19   Antonietta Breach, PA-C  pantoprazole (PROTONIX) 40 MG tablet Take 40 mg by mouth daily before breakfast. 02/22/18   [provider]  SYNTHROID 175 MCG tablet Take 175 mcg by mouth daily before breakfast. Take 11mcg by mouth on Tuesdays, Thursdays, Saturdays and Sundays 01/05/18   [provider]  trimethoprim (TRIMPEX) 100 MG tablet Take 100 mg by mouth daily. 07/17/19   [provider]   Physical Exam: Vitals:   02/15/20 2330 02/15/20 2343 02/15/20 2345 02/16/20 0100  BP: (!) 167/96 (!) 167/96 (!) 169/88 (!) 163/73  Pulse: (!) 108 Marland Kitchen)  105 (!) 33 91  Resp: 15 18 19 20   Temp:      TempSrc:      SpO2: 98% 98% 94% 98%    Wt Readings from Last 3 Encounters:  02/10/20 66.2 kg  04/12/19 68 kg  02/20/19 69.9 kg    . General:  Appears calm and comfortable. Alert. Oriented to person and place only.  . Eyes: EOMI, normal lids, irises & conjunctiva . ENT: grossly normal hearing, lips & tongue . Neck: normal ROM . Cardiovascular: RRR, no m/r/g. No LE edema. Marland Kitchen Respiratory: CTA bilaterally, no w/r/r. Normal respiratory effort. . Abdomen: soft, moderate suprapubic tenderness to palpation . Skin: no rash or induration seen on limited exam . Musculoskeletal: grossly normal tone BUE/BLE . Psychiatric: grossly normal mood and affect, speech fluent and appropriate, patient with some confusion but speech normal  . Neurologic: grossly non-focal.          Labs on Admission:  Basic Metabolic Panel: Recent Labs  Lab 02/15/20 1510  NA 144  K 3.9  CL 111  CO2 22  GLUCOSE 110*  BUN 19  CREATININE 2.20*  CALCIUM 9.6    Liver Function Tests: Recent Labs  Lab 02/15/20 1510  AST 15  ALT 9  ALKPHOS 64  BILITOT 0.9  PROT 6.1*  ALBUMIN 3.1*   No results for input(s): LIPASE, AMYLASE in the last 168 hours. No results for input(s): AMMONIA in the last 168 hours. CBC: Recent Labs  Lab 02/15/20 1510  WBC 12.8*  NEUTROABS 9.9*  HGB 13.0  HCT 44.2  MCV 91.7  PLT 461*   Cardiac Enzymes: No results for input(s): CKTOTAL, CKMB, CKMBINDEX, TROPONINI in the last 168 hours.  BNP (last 3 results) No results for input(s): BNP in the last 8760 hours.  ProBNP (last 3 results) No results for input(s): PROBNP in the last 8760 hours.  CBG: No results for input(s): GLUCAP in the last 168 hours.  Radiological Exams on Admission: CT HEAD WO CONTRAST  Result Date: 02/15/2020 CLINICAL DATA:  Transient ischemic attack. Facial droop. Arm drift. History of stroke. EXAM: CT HEAD WITHOUT CONTRAST TECHNIQUE: Contiguous axial images were obtained from the base of the skull through the vertex without intravenous contrast. COMPARISON:  07/23/2019 FINDINGS: Brain: No evidence of acute infarction, hemorrhage, hydrocephalus, extra-axial collection or mass lesion/mass effect. Extensive atrophy and chronic microvascular ischemic changes are again noted. Vascular: No hyperdense vessel or unexpected calcification. Skull: Normal. Negative for fracture or focal lesion. Sinuses/Orbits: No acute finding. Other: None. IMPRESSION: 1. No acute intracranial abnormality. 2. Extensive atrophy and chronic microvascular ischemic changes. Electronically Signed   By: Constance Holster M.D.   On: 02/15/2020 18:54   MR BRAIN WO CONTRAST  Result Date: 02/15/2020 CLINICAL DATA:  Acute neuro deficit.  Facial droop and arm drift. EXAM: MRI HEAD WITHOUT CONTRAST TECHNIQUE: Multiplanar, multiecho pulse sequences of the brain and surrounding structures were obtained without intravenous contrast. COMPARISON:  CT head 02/15/2020.  MRI head 03/26/2019.  FINDINGS: Brain: Negative for acute infarct. Moderate to advanced atrophy. Chronic microvascular ischemic changes throughout the white matter and pons. Small chronic infarcts in the cerebellum bilaterally. Negative for hemorrhage or mass. Vascular: Normal arterial flow voids. Skull and upper cervical spine: No focal skeletal lesion. Sinuses/Orbits: Mucosal edema in the paranasal sinuses most notably in the sphenoid sinus. Bilateral cataract extraction Other: None IMPRESSION: Negative for acute infarct Stable changes of atrophy and chronic ischemia. Electronically Signed   By: Franchot Gallo M.D.  On: 02/15/2020 20:36    EKG: Independently reviewed. HR 90. Sinus rhythm. QTc 502. PVC's present. No STEMI.  Assessment/Plan Principal Problem:   Debility Active Problems:   Stage 4 chronic kidney disease (HCC)   Hypothyroidism   Acute cystitis   Elevated BP without diagnosis of hypertension  74 y.o. female with PMH of CVA, hypothyroidism, CKD who was brought to ER with concerns for TIA and ultimately admitted for debility and UTI.  Debility Generalized Weakness - declined over last 1-2 months per daughter with multiple falls - patient weak and unable to walk even with assistance in ED - CT Head and MR Brain without acute findings - could be secondary to acute infection or subacute decline due to age  - will treat infection - PT/OT/SLP - Cognitive eval - NPO until swallow study   Acute Cystitis - UA appears infected - F/u urine cx - Cont Rocephin x3 days - suprapubic tenderness present - Ur Cx in 2019 with resistance only to Amp and Amp/Sulbactam  CKD Stage IV - at baseline  Elevated BP without HTN diagnosis - consistently elevated BP in ED - no antihypertensives on med list - will start Norvasc 5 mg  Hypothyroidism - cont PTA Synthroid - No TSH on file; ordered  Code Status: Full DVT Prophylaxis: Heparin; CrCl 20 Family Communication: EDP just spoke with patient's daughter;  will not call again due to hour Disposition Plan: Admit to inpatient. Patient currently unable to ambulate even with assistance. She is currently unable to continue level of care with her spouse at home. Requires PT/OT consultation. Unsafe discharge from ER. Anticipate discharge in 2-3 days; likely to rehab.  Time spent: 50 minutes  Chauncey Mann, MD [facility] Pager [phone]

## 2020-02-16 NOTE — ED Provider Notes (Signed)
12:17 AM  Informed by nurse that patient's daughter concerned about patient and plan to discharge and wanted to speak to a physician.  Patient seen by previous providers.  Currently up for discharge.  Spoke with patient's daughter Lattie Haw by phone who lives in [place].  She is concerned that patient has had decline over 1-2 months.  She has had several falls.  She is worried that she is not safe at home.  She reports that her father is not all cognitively there and can not care for her.  No one is able to get in touch with patient's husband.  It appears she was sent here for generalized weakness, altered mental status.  MRI of the brain performed that showed no acute infarct.  It does appear to have a urinary tract infection which has previously grown E. coli that was pansensitive except to penicillin.  Will give Rocephin IV here.  Nursing documentation was that patient was not able to ambulate here due to significant weakness.  I agree that she likely needs IV antibiotics, physical therapy, case management/social work consultation for possible placement after discharge.  Will discuss with hospitalist.  PCP Reynaldo Minium.  12:29 AM Discussed patient's case with hospitalist, Dr. Marice Potter.  I have recommended admission and patient (and family if present) agree with this plan. Admitting physician will place admission orders.   I reviewed all nursing notes, vitals, pertinent previous records and reviewed/interpreted all EKGs, lab and urine results, imaging (as available).     Josuha Fontanez, Delice Bison, DO 02/16/20 [phone]

## 2020-02-16 NOTE — Progress Notes (Signed)
PROGRESS NOTE  Brief Narrative: Carolyn Robertson is a 74 y.o. female with a history of stroke, 1-2 month progressive mental and functional decline, presented for inability to get up from seating. Work up showed stable vital signs, WBC 12.8k, SCr 2.2, pyuria, bacteriuria. CT head and MRI brain revealed atrophy, chronic ischemia, but no acute infarcts. Plan was to discharge home, though she was unable to ambulate even with assistance. She was admitted this morning for debility and UTI due to unsafe discharge plan.   Subjective: Feels a bit better this morning, husband reports she's thinking more clearly today. No new complaints.  Objective: BP (!) 182/74 (BP Location: Left Arm)   Pulse 82   Temp 98.2 F (36.8 C) (Oral)   Resp 19   LMP  (LMP Unknown)   SpO2 99%   Gen: Frail-appearing Pulm: Clear and nonlabored  CV: RRR, no murmur, no JVD, no edema GI: Soft, NT, ND, +BS  Neuro: Alert and oriented. No dysarthria, speech is sparse, no definite focal deficits. Skin: No rashes, lesions or ulcers  Assessment & Plan: Principal Problem:   Debility Active Problems:   Stage 4 chronic kidney disease (HCC)   Hypothyroidism   Acute cystitis   Elevated BP without diagnosis of hypertension  Generalized weakness, debility: Suspect mostly due to muscular deconditioning (in bed 18-20 hours/day, up in wheelchair/carrier otherwise).  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 CT and MRI do not reveal acute stroke.  - Delirium precautions, consider outpatient neuropsychiatry evaluation.  History of CVA:  - Continue aspirin. Not on a statin though has no noted allergy. Pt doesn't want to take anything for this.   UTI, recurrent:  - Continue CTX x3 days pending culture.   Chronic pain, osteoarthritis:  - Continue home prednisone 5mg  BID - Continue cymbalta.   GERD:  - Continue PPI  Hypothyroidism:  - Continue  synthroid. Free T4 wnl.  Stage IV CKD: Appears to be near baseline.  - Continue monitoring, avoid nephrotoxins.   Elevated BP:  - Norvasc 5mg  started at admission.   On estrogen: Will hold and watch for withdrawal VB, to reduce risk of DVT.   Patrecia Pour, MD Pager on amion 02/16/2020, 9:35 AM

## 2020-02-17 DIAGNOSIS — E039 Hypothyroidism, unspecified: Secondary | ICD-10-CM

## 2020-02-17 DIAGNOSIS — R5381 Other malaise: Secondary | ICD-10-CM | POA: Diagnosis not present

## 2020-02-17 DIAGNOSIS — R030 Elevated blood-pressure reading, without diagnosis of hypertension: Secondary | ICD-10-CM | POA: Diagnosis not present

## 2020-02-17 DIAGNOSIS — N3000 Acute cystitis without hematuria: Secondary | ICD-10-CM | POA: Diagnosis not present

## 2020-02-17 LAB — BASIC METABOLIC PANEL
Anion gap: 8 (ref 5–15)
BUN: 23 mg/dL (ref 8–23)
CO2: 20 mmol/L — ABNORMAL LOW (ref 22–32)
Calcium: 8.7 mg/dL — ABNORMAL LOW (ref 8.9–10.3)
Chloride: 109 mmol/L (ref 98–111)
Creatinine, Ser: 1.95 mg/dL — ABNORMAL HIGH (ref 0.44–1.00)
GFR calc Af Amer: 28 mL/min — ABNORMAL LOW (ref >60)
GFR calc non Af Amer: 25 mL/min — ABNORMAL LOW (ref >60)
Glucose, Bld: 111 mg/dL — ABNORMAL HIGH (ref 70–99)
Potassium: 4.2 mmol/L (ref 3.5–5.1)
Sodium: 137 mmol/L (ref 135–145)

## 2020-02-17 MED ORDER — ACETAMINOPHEN 325 MG PO TABS
650.0000 mg | ORAL_TABLET | Freq: Four times a day (QID) | ORAL | Status: DC | PRN
  Administered 2020-02-17 (×2): 650 mg via ORAL
  Filled 2020-02-17 (×3): qty 2

## 2020-02-17 MED ORDER — ADULT MULTIVITAMIN W/MINERALS CH
1.0000 | ORAL_TABLET | Freq: Every day | ORAL | Status: DC
  Administered 2020-02-17 – 2020-02-18 (×2): 1 via ORAL
  Filled 2020-02-17: qty 1

## 2020-02-17 MED ORDER — HYDROCODONE-ACETAMINOPHEN 5-325 MG PO TABS
1.0000 | ORAL_TABLET | Freq: Four times a day (QID) | ORAL | Status: DC | PRN
  Administered 2020-02-17: 2 via ORAL
  Administered 2020-02-17 – 2020-02-18 (×3): 1 via ORAL
  Filled 2020-02-17: qty 1
  Filled 2020-02-17: qty 2
  Filled 2020-02-17 (×3): qty 1

## 2020-02-17 MED ORDER — ENSURE ENLIVE PO LIQD
237.0000 mL | Freq: Three times a day (TID) | ORAL | Status: DC
  Administered 2020-02-17 – 2020-02-18 (×4): 237 mL via ORAL

## 2020-02-17 NOTE — Evaluation (Signed)
Physical Therapy Evaluation Patient Details Name: Carolyn Robertson MRN: [id number] DOB: 12-03-1945 Today's Date: 02/17/2020   History of Present Illness  Pt is a 74 year old woman admitted on 02/16/20 with 1-2 month mental and functional decline, falls and debility. Brain MRI negative for acute infarct, positive for chronic ischemia, and atrophy. 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   Clinical Impression  Pt in bed upon arrival of PT, agreeable to evaluation at this time. Prior to admission the pt was mobilizing with use of RW until most recent bouts of falling, after which she transferred to transport [facility] with assist of husband for mobility. The pt now presents with limitations in functional mobility, endurance, strength and stability due to above dx and gradually declining activity, and will continue to benefit from skilled PT to address these deficits. The pt was able to demo good bed mobility, but required assist of 2 to complete stand and transfer to recliner with use of RW for safety. The pt reports general fatigue following short mobility, and will therefore continue to benefit from skilled PT acutely and at SNF-rehab prior to return home due to her husband's decreased ability to assist her safely with mobility.      Follow Up Recommendations SNF;Supervision/Assistance - 24 hour    Equipment Recommendations  (defer to post acute)    Recommendations for Other Services       Precautions / Restrictions Precautions Precautions: Fall Restrictions Weight Bearing Restrictions: No      Mobility  Bed Mobility Overal bed mobility: Needs Assistance Bed Mobility: Supine to Sit     Supine to sit: Min guard     General bed mobility comments: HOB up, increased time  Transfers Overall transfer level: Needs assistance Equipment used: Rolling walker (2 wheeled) Transfers: Sit to/from [facility] Sit to Stand: +2  physical assistance;Mod assist Stand pivot transfers: +2 safety/equipment;Min assist       General transfer comment: modA of 2 to complete rise despite good initiation from pt, verbal and tactile cues for hip ext and posture  Ambulation/Gait Ambulation/Gait assistance: Min assist;+2 safety/equipment Gait Distance (Feet): 5 Feet Assistive device: Rolling walker (2 wheeled) Gait Pattern/deviations: Step-to pattern;Decreased stride length;Shuffle;Trunk flexed   Gait velocity interpretation: <1.31 ft/sec, indicative of household ambulator General Gait Details: pt able to initiate steps, sig increased time and effort for wt shift for steps, minimal clearance, sig reliance on BUE support  Stairs            Wheelchair Mobility    Modified Rankin (Stroke Patients Only)       Balance Overall balance assessment: Needs assistance Sitting-balance support: No upper extremity supported;Feet supported Sitting balance-Leahy Scale: Fair Sitting balance - Comments: pt report losing balance in sitting while donning socks   Standing balance support: Bilateral upper extremity supported Standing balance-Leahy Scale: Poor Standing balance comment: min+2 asnd BUE support to maintain                             Pertinent Vitals/Pain Pain Assessment: Faces Faces Pain Scale: Hurts even more Pain Location: back Pain Descriptors / Indicators: Discomfort;Aching Pain Intervention(s): Limited activity within patient's tolerance;Monitored during session;Repositioned    Home Living Family/patient expects to be discharged to:: Private residence Living Arrangements: Spouse/significant other Available Help at Discharge: Family;Available 24 hours/day Type of Home: House Home Access: Stairs to enter   [employer] of Steps: 1  Home Layout: One level Home Equipment: Walker - 2 wheels;Transport chair;Grab bars - tub/shower;Toilet riser;Shower seat      Prior Function Level of  Independence: Needs assistance   Gait / Transfers Assistance Needed: walking with a walker until she fell one week ago, husband helped get her in and out of transport chair in the days leading up to admission   ADL's / Homemaking Assistance Needed: sponge bathing and dressing herself typically, needing more assistance in the last week, husband does IADL and driving        Hand Dominance   Dominant Hand: Right    Extremity/Trunk Assessment   Upper Extremity Assessment Upper Extremity Assessment: Generalized weakness    Lower Extremity Assessment Lower Extremity Assessment: Generalized weakness(pt reports some neuropathy at baseline, generally 4-/5)    Cervical / Trunk Assessment Cervical / Trunk Assessment: Kyphotic(hx of back surgery)  Communication   Communication: No difficulties  Cognition Arousal/Alertness: Awake/alert Behavior During Therapy: WFL for tasks assessed/performed Overall Cognitive Status: Impaired/Different from baseline Area of Impairment: Memory                     Memory: Decreased short-term memory                General Comments      Exercises     Assessment/Plan    PT Assessment Patient needs continued PT services  PT Problem List Decreased strength;Decreased mobility;Decreased activity tolerance;Decreased balance;Pain;Decreased safety awareness       PT Treatment Interventions DME instruction;Therapeutic exercise;Gait training;Balance training;Stair training;Functional mobility training;Therapeutic activities;Patient/family education    PT Goals (Current goals can be found in the Care Plan section)  Acute Rehab PT Goals Patient Stated Goal: to get stronger in rehab PT Goal Formulation: With patient/family Time For Goal Achievement: 03/02/20 Potential to Achieve Goals: Good    Frequency Min 3X/week   Barriers to discharge Decreased caregiver support      Co-evaluation PT/OT/SLP Co-Evaluation/Treatment: Yes Reason  for Co-Treatment: To address functional/ADL transfers;For patient/therapist safety PT goals addressed during session: Mobility/safety with mobility;Balance;Proper use of DME OT goals addressed during session: ADL's and self-care       AM-PAC PT "6 Clicks" Mobility  Outcome Measure Help needed turning from your back to your side while in a flat bed without using bedrails?: None Help needed moving from lying on your back to sitting on the side of a flat bed without using bedrails?: None Help needed moving to and from a bed to a chair (including a wheelchair)?: A Lot Help needed standing up from a chair using your arms (e.g., wheelchair or bedside chair)?: A Little Help needed to walk in hospital room?: A Lot Help needed climbing 3-5 steps with a railing? : Total 6 Click Score: 16    End of Session Equipment Utilized During Treatment: Gait belt Activity Tolerance: Patient tolerated treatment well Patient left: in chair;with chair alarm set;with call bell/phone within reach;with family/visitor present Nurse Communication: Mobility status PT Visit Diagnosis: Muscle weakness (generalized) (M62.81);History of falling (Z91.81);Pain Pain - Right/Left: (central back) Pain - part of body: (back')    Time: 6195-0932 PT Time Calculation (min) (ACUTE ONLY): 27 min   Charges:   PT Evaluation $PT Eval Moderate Complexity: 1 Mod          Karma Ganja, PT, DPT   Acute Rehabilitation Department Pager #: [phone]   Otho Bellows 02/17/2020, 11:24 AM

## 2020-02-17 NOTE — Progress Notes (Signed)
Initial Nutrition Assessment  DOCUMENTATION CODES:   Not applicable  INTERVENTION:   - Ensure Enlive po TID, each supplement provides 350 kcal and 20 grams of protein  - Magic cup BID with meals, each supplement provides 290 kcal and 9 grams of protein  - MVI with minerals daily  - Liberalize diet to Regular, verbal with readback order placed per Dr. Bonner Puna  NUTRITION DIAGNOSIS:   Increased nutrient needs related to acute illness as evidenced by estimated needs.  GOAL:   Patient will meet greater than or equal to 90% of their needs  MONITOR:   PO intake, Supplement acceptance, Labs, Weight trends  REASON FOR ASSESSMENT:   Malnutrition Screening Tool    ASSESSMENT:   74 year old female who presented on 5/01 with confusion, debility, falls. PMH of CVA, hypothyroidism, CKD. Pt admitted with debility and UTI.   Spoke with pt briefly at bedside. Pt with confusion and unable to provide much reliable history. Pt received a phone call halfway through RD interview and asked RD to step out. RD later returned but was unable to finish interview due pt speaking with another provider.  Pt reports that her appetite has been poor for the last 6 months. Pt states that she typically eats 2 meals daily and that her husband cooks. Pt states that she had back surgery 6 months ago and has never really recovered from that. Pt believes she has lost about 30 lbs.  Reviewed weight history in chart. Pt with a 9 kg weight loss over the last 10 months. This is a 13.2% weight loss which is not significant for timeframe but is concerning. Weight on admission appears stated rather than measured. Will attempt to obtained measured weight at follow-up. Pt with non-pitting edema to BLE.  RD will increase Ensure Enlive to TID. Will also order Magic Cups with meals.  Medications reviewed and include: Ensure Enlive BID, protonix, prednisone, IV abx  Labs reviewed.  NUTRITION - FOCUSED PHYSICAL  EXAM:  Unable to complete at this time.  Diet Order:   Diet Order            Diet regular Room service appropriate? Yes; Fluid consistency: Thin  Diet effective now              EDUCATION NEEDS:   Not appropriate for education at this time  Skin:  Skin Assessment: Reviewed RN Assessment  Last BM:  02/15/20  Height:   Ht Readings from Last 1 Encounters:  02/16/20 5\' 5"  (1.651 m)    Weight:   Wt Readings from Last 1 Encounters:  02/16/20 59 kg    Ideal Body Weight:  56.8 kg  BMI:  Body mass index is 21.63 kg/m.  Estimated Nutritional Needs:   Kcal:  1600-1800  Protein:  75-90 grams  Fluid:  1.6-1.8 L    Gaynell Face, MS, RD, LDN Inpatient Clinical Dietitian Pager: [phone] Weekend/After Hours: [phone]

## 2020-02-17 NOTE — NC FL2 (Signed)
[place] LEVEL OF CARE SCREENING TOOL     IDENTIFICATION  Patient Name: Carolyn Robertson Birthdate: 12-15-1945 Sex: female Admission Date (Current Location): 02/15/2020  [facility] and [place] Number:  Herbalist and Address:  [facility], [place] [address], [place], [place] [address]      Provider Number: [id number]  Attending Physician Name and Address:  Patrecia Pour, MD  Relative Name and Phone Number:  Benson Norway    Current Level of Care: Hospital Recommended Level of Care: [place] Prior Approval Number:    Date Approved/Denied:   PASRR Number: Pending  Discharge Plan: SNF    Current Diagnoses: Patient Active Problem List   Diagnosis Date Noted  . Debility 02/16/2020  . Hypothyroidism 02/16/2020  . Acute cystitis 02/16/2020  . Elevated BP without diagnosis of hypertension 02/16/2020  . Spondylolisthesis, lumbar region 02/20/2019  . TIA (transient ischemic attack) 06/25/2018  . CVA (cerebrovascular accident) ([place]) 06/24/2018  . Lumbar herniated disc 04/12/2018  . Stage 4 chronic kidney disease ([place]) 02/19/2018  . Chronic venous insufficiency 02/19/2018  . Primary localized osteoarthritis of right knee 11/14/2016    Orientation RESPIRATION BLADDER Height & Weight     Self, Time, Situation, Place  Normal Incontinent Weight: 130 lb (59 kg) Height:  5\' 5"  (165.1 cm)  BEHAVIORAL SYMPTOMS/MOOD NEUROLOGICAL BOWEL NUTRITION STATUS      Incontinent Diet(See discharge summary)  AMBULATORY STATUS COMMUNICATION OF NEEDS Skin   Extensive Assist Verbally Normal                       Personal Care Assistance Level of Assistance  Bathing, Dressing, Feeding Bathing Assistance: Maximum assistance Feeding assistance: Independent Dressing Assistance: Maximum assistance     Functional Limitations Info  Sight, Speech, Hearing Sight Info: Adequate Hearing Info: Adequate Speech Info: Adequate     SPECIAL CARE FACTORS FREQUENCY  PT (By licensed PT), OT (By licensed OT)     PT Frequency: 5x a week OT Frequency: 5x a week            Contractures Contractures Info: Not present    Additional Factors Info  Allergies, Code Status Code Status Info: Full Allergies Info: Other, Penicillins, Sulfa Antibiotics, Codeine, Hydrocodone-acetaminophen, Naproxen Sodium, Tape           Current Medications (02/17/2020):  This is the current hospital active medication list Current Facility-Administered Medications  Medication Dose Route Frequency Provider Last Rate Last Admin  . acetaminophen (TYLENOL) tablet 650 mg  650 mg Oral Q6H PRN Lovey Newcomer T, NP   650 mg at 02/17/20 0646  . amLODipine (NORVASC) tablet 5 mg  5 mg Oral Daily Fair, Marin Shutter, MD   5 mg at 02/17/20 0932  . aspirin EC tablet 81 mg  81 mg Oral Daily Chauncey Mann, MD   81 mg at 02/17/20 0932  . cefTRIAXone (ROCEPHIN) 1 g in sodium chloride 0.9 % 100 mL IVPB  1 g Intravenous Q24H Fair, Chelsea N, MD 200 mL/hr at 02/16/20 2011 1 g at 02/16/20 2011  . febuxostat (ULORIC) tablet 40 mg  40 mg Oral QHS Chauncey Mann, MD   40 mg at 02/16/20 2013  . feeding supplement (ENSURE ENLIVE) (ENSURE ENLIVE) liquid 237 mL  237 mL Oral TID BM Patrecia Pour, MD   237 mL at 02/17/20 1415  . furosemide (LASIX) tablet 40 mg  40 mg Oral Daily PRN Fair, Chelsea  N, MD      . heparin injection 5,000 Units  5,000 Units Subcutaneous Q8H Chauncey Mann, MD   5,000 Units at 02/17/20 1415  . HYDROcodone-acetaminophen (NORCO/VICODIN) 5-325 MG per tablet 1-2 tablet  1-2 tablet Oral Q6H PRN Patrecia Pour, MD      . Derrill Memo ON 02/18/2020] levothyroxine (SYNTHROID) tablet 175 mcg  175 mcg Oral Once per day on Sun Tue Thu Sat Donnamae Jude, [facility]      . levothyroxine (SYNTHROID) tablet 200 mcg  200 mcg Oral Once per day on Mon Wed Fri Donnamae Jude, RPH   200 mcg at 02/17/20 4643  . multivitamin with minerals tablet 1 tablet  1 tablet Oral Daily Patrecia Pour, MD   1 tablet at 02/17/20 1415  . pantoprazole (PROTONIX) EC tablet 40 mg  40 mg Oral QAC breakfast Chauncey Mann, MD   40 mg at 02/17/20 0604  . predniSONE (DELTASONE) tablet 5 mg  5 mg Oral BID WC Patrecia Pour, MD   5 mg at 02/17/20 0605     Discharge Medications: Please see discharge summary for a list of discharge medications.  Relevant Imaging Results:  Relevant Lab Results:   Additional Information SSN [id number]  Neysa Hotter [place], [place]

## 2020-02-17 NOTE — TOC Initial Note (Addendum)
Transition of Care [facility]) - Initial/Assessment Note    Patient Details  Name: Carolyn Robertson MRN: [id number] Date of Birth: 26-Jul-1946  Transition of Care [facility]) CM/SW Contact:    Jacquelynn Cree Phone Number: 02/17/2020, 2:02 PM  Clinical Narrative:                 CSW received consult for possible SNF placement at time of discharge. CSW met bedside with patient and introduced role. Patient expressed understanding of PT recommendation and is agreeable to SNF placement. Patient expressed a preference for [facility]. Patient provided verbal permission for her spouse [name] and daughter Lattie Haw to be contacted. Patient expressed her spouse has trouble hearing at times. Patient expressed no further questions at this time.  CSW contacted patient's daughter Lattie Haw to discuss PT recommendation. Daughter expressed patient is in need of additional rehab services at discharge, prior to returning home. Daughter does not believe spouse can provide adequate post discharge care without additional rehab. Daughter requested to be contacted prior to patient discharging. TOC team will continue to follow..   Expected Discharge Plan: Skilled Nursing Facility Barriers to Discharge: Continued Medical Work up   Patient Goals and CMS Choice Patient states their goals for this hospitalization and ongoing recovery are:: to return home CMS Medicare.gov Compare Post Acute Care list provided to:: Patient Choice offered to / list presented to : Patient  Expected Discharge Plan and Services Expected Discharge Plan: [place] In-house Referral: Clinical Social Work     Living arrangements for the past 2 months: [place]                                      Prior Living Arrangements/Services Living arrangements for the past 2 months: Single Family Home Lives with:: Spouse Patient language and need for interpreter reviewed:: Yes Do you feel safe going back to the place where  you live?: Yes      Need for Family Participation in Patient Care: No (Comment) Care giver support system in place?: Yes (comment)   Criminal Activity/Legal Involvement Pertinent to Current Situation/Hospitalization: No - Comment as needed  Activities of Daily Living Home Assistive Devices/Equipment: Gilford Rile (specify type) ADL Screening (condition at time of admission) Patient's cognitive ability adequate to safely complete daily activities?: No Is the patient deaf or have difficulty hearing?: No Does the patient have difficulty seeing, even when wearing glasses/contacts?: No Does the patient have difficulty concentrating, remembering, or making decisions?: Yes Patient able to express need for assistance with ADLs?: Yes Does the patient have difficulty dressing or bathing?: Yes Independently performs ADLs?: No Communication: Independent Dressing (OT): Needs assistance Is this a change from baseline?: Change from baseline, expected to last >3 days Grooming: Dependent Is this a change from baseline?: Change from baseline, expected to last >3 days Feeding: Needs assistance Is this a change from baseline?: Change from baseline, expected to last >3 days Bathing: Dependent Is this a change from baseline?: Change from baseline, expected to last >3 days Toileting: Dependent Is this a change from baseline?: Change from baseline, expected to last >3days In/Out Bed: Dependent Is this a change from baseline?: Change from baseline, expected to last >3 days Walks in Home: Dependent Is this a change from baseline?: Change from baseline, expected to last >3 days Does the patient have difficulty walking or climbing stairs?: Yes Weakness of Legs: Both Weakness of Arms/Hands: None  Permission Sought/Granted Permission sought to share information with : Facility Sport and exercise psychologist, Family Supports Permission granted to share information with : Yes, Verbal Permission Granted  Share Information  with NAME: [name] & Lattie Haw  Permission granted to share info w AGENCY: SNFs  Permission granted to share info w Relationship: Spouse & daughter  Permission granted to share info w Contact Information: [phone]  Emotional Assessment Appearance:: Appears stated age Attitude/Demeanor/Rapport: Engaged Affect (typically observed): Pleasant, Calm Orientation: : Oriented to Self, Oriented to Place, Oriented to  Time, Oriented to Situation Alcohol / Substance Use: Not Applicable Psych Involvement: No (comment)  Admission diagnosis:  Debility [R53.81] Chronic renal insufficiency, stage IV (severe) ([place]) [N18.4] Generalized weakness [R53.1] Frequent falls [R29.6] Urinary tract infection without hematuria, site unspecified [N39.0] Patient Active Problem List   Diagnosis Date Noted  . Debility 02/16/2020  . Hypothyroidism 02/16/2020  . Acute cystitis 02/16/2020  . Elevated BP without diagnosis of hypertension 02/16/2020  . Spondylolisthesis, lumbar region 02/20/2019  . TIA (transient ischemic attack) 06/25/2018  . CVA (cerebrovascular accident) ([place]) 06/24/2018    Class: Acute  . Lumbar herniated disc 04/12/2018  . Stage 4 chronic kidney disease ([place]) 02/19/2018  . Chronic venous insufficiency 02/19/2018  . Primary localized osteoarthritis of right knee 11/14/2016   PCP:  Burnard Bunting, MD Pharmacy:   [facility] [address], [place] - [place] AT [place] [address] Phone: [phone] Fax: [place], [place] [address] Phone: [phone] Fax: [phone]     Social Determinants of Health (SDOH) Interventions    Readmission Risk Interventions No flowsheet data found.

## 2020-02-17 NOTE — Evaluation (Signed)
Occupational Therapy Evaluation Patient Details Name: Carolyn Robertson MRN: [id number] DOB: 11-Dec-1945 Today's Date: 02/17/2020    History of Present Illness Pt is a 74 year old woman admitted on 02/16/20 with 1-2 month mental and functional decline, falls and debility. Brain MRI negative for acute infarct, positive for chronic ischemia, and atrophy. 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    Clinical Impression   Pt typically ambulates with a walker, sponge bathes and dresses herself, but in the days leading up to admission she was assisted to transfer to her transport chair by her husband and unable to ambulate. Pt presents with generalized weakness and impaired standing balance. Cognition was appropriate for evaluation, but she reports memory deficits. Pt's husband can offer limited physical assistance. Recommending SNF for further rehab, pt and husband are in agreement. Will follow acutely.   Follow Up Recommendations  SNF;Supervision/Assistance - 24 hour    Equipment Recommendations       Recommendations for Other Services       Precautions / Restrictions Precautions Precautions: Fall Restrictions Weight Bearing Restrictions: No      Mobility Bed Mobility Overal bed mobility: Needs Assistance Bed Mobility: Supine to Sit     Supine to sit: Min guard     General bed mobility comments: HOB up, increased time  Transfers Overall transfer level: Needs assistance Equipment used: Rolling walker (2 wheeled) Transfers: Sit to/from [facility] Sit to Stand: +2 physical assistance;Mod assist Stand pivot transfers: +2 safety/equipment;Min assist       General transfer comment: assist to rise and steady    Balance Overall balance assessment: Needs assistance   Sitting balance-Leahy Scale: Fair Sitting balance - Comments: pt report losing balance in sitting while donning socks   Standing balance support:  Bilateral upper extremity supported Standing balance-Leahy Scale: Poor                             ADL either performed or assessed with clinical judgement   ADL Overall ADL's : Needs assistance/impaired Eating/Feeding: Independent   Grooming: Set up;Sitting   Upper Body Bathing: Minimal assistance;Sitting   Lower Body Bathing: Total assistance;Sit to/from stand   Upper Body Dressing : Minimal assistance;Sitting   Lower Body Dressing: Total assistance;Sit to/from stand   Toilet Transfer: Minimal assistance;+2 for safety/equipment;Stand-pivot;RW   Toileting- Clothing Manipulation and Hygiene: Minimal assistance;Sit to/from stand       Functional mobility during ADLs: Minimal assistance;+2 for safety/equipment;Rolling walker       Vision Patient Visual Report: No change from baseline       Perception     Praxis      Pertinent Vitals/Pain Pain Assessment: Faces Faces Pain Scale: Hurts even more Pain Location: back Pain Descriptors / Indicators: Discomfort;Aching Pain Intervention(s): Monitored during session;Premedicated before session;Repositioned     Hand Dominance Right   Extremity/Trunk Assessment Upper Extremity Assessment Upper Extremity Assessment: Generalized weakness   Lower Extremity Assessment Lower Extremity Assessment: Defer to PT evaluation   Cervical / Trunk Assessment Cervical / Trunk Assessment: Kyphotic(hx of back surgery)   Communication Communication Communication: No difficulties   Cognition Arousal/Alertness: Awake/alert Behavior During Therapy: WFL for tasks assessed/performed Overall Cognitive Status: Impaired/Different from baseline Area of Impairment: Memory                     Memory: Decreased short-term memory  General Comments       Exercises     Shoulder Instructions      Home Living Family/patient expects to be discharged to:: Private residence Living Arrangements:  Spouse/significant other Available Help at Discharge: Family;Available 24 hours/day Type of Home: House Home Access: Stairs to enter [employer] of Steps: 1   Home Layout: One level     Bathroom Shower/Tub: Teacher, early years/pre: Standard Bathroom Accessibility: Yes How Accessible: Accessible via walker Home Equipment: Walker - 2 wheels;Transport chair;Grab bars - tub/shower;Toilet riser;Shower seat          Prior Functioning/Environment Level of Independence: Needs assistance  Gait / Transfers Assistance Needed: walking with a walker until she fell one week ago, husband helped get her in and out of transport chair in the days leading up to admission  ADL's / Homemaking Assistance Needed: sponge bathing and dressing herself typically, needing more assistance in the last week, husband does IADL and driving            OT Problem List: Decreased strength;Decreased activity tolerance;Impaired balance (sitting and/or standing);Decreased knowledge of use of DME or AE;Pain;Decreased cognition      OT Treatment/Interventions: Self-care/ADL training;DME and/or AE instruction;Patient/family education;Balance training;Therapeutic activities    OT Goals(Current goals can be found in the care plan section) Acute Rehab OT Goals Patient Stated Goal: to get stronger in rehab OT Goal Formulation: With patient Time For Goal Achievement: 03/02/20 Potential to Achieve Goals: Good ADL Goals Pt Will Perform Grooming: with supervision;standing Pt Will Perform Upper Body Dressing: with set-up;sitting Pt Will Perform Lower Body Dressing: with min assist;sit to/from stand Pt Will Transfer to Toilet: with supervision;ambulating;bedside commode(over toilet) Pt Will Perform Toileting - Clothing Manipulation and hygiene: with supervision;sit to/from stand  OT Frequency: Min 2X/week   Barriers to D/C:            Co-evaluation PT/OT/SLP Co-Evaluation/Treatment: Yes Reason  for Co-Treatment: For patient/therapist safety   OT goals addressed during session: ADL's and self-care      AM-PAC OT "6 Clicks" Daily Activity     Outcome Measure Help from another person eating meals?: None Help from another person taking care of personal grooming?: A Little Help from another person toileting, which includes using toliet, bedpan, or urinal?: A Lot Help from another person bathing (including washing, rinsing, drying)?: A Lot Help from another person to put on and taking off regular upper body clothing?: A Little Help from another person to put on and taking off regular lower body clothing?: Total 6 Click Score: 15   End of Session Equipment Utilized During Treatment: Gait belt;Rolling walker Nurse Communication: Mobility status  Activity Tolerance: Patient tolerated treatment well Patient left: in chair;with call bell/phone within reach;with chair alarm set;with family/visitor present  OT Visit Diagnosis: Unsteadiness on feet (R26.81);Other abnormalities of gait and mobility (R26.89);Pain;Muscle weakness (generalized) (M62.81);Other symptoms and signs involving cognitive function                Time: 3976-7341 OT Time Calculation (min): 26 min Charges:  OT General Charges $OT Visit: 1 Visit OT Evaluation $OT Eval Moderate Complexity: 1 Mod  Nestor Lewandowsky, OTR/L Acute Rehabilitation Services Pager: [phone] Office: [phone] Malka So 02/17/2020, 9:56 AM

## 2020-02-17 NOTE — Social Work (Signed)
PASRR #[id number] E.

## 2020-02-17 NOTE — Evaluation (Signed)
Clinical/Bedside Swallow Evaluation Patient Details  Name: Carolyn Robertson MRN: [id number] Date of Birth: 09-Sep-1946  Today's Date: 02/17/2020 Time: SLP Start Time (ACUTE ONLY): 1333 SLP Stop Time (ACUTE ONLY): 1341 SLP Time Calculation (min) (ACUTE ONLY): 8 min  Past Medical History:  Past Medical History:  Diagnosis Date  . Anxiety   . Arthritis    RA  . 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 Depression   . GERD (gastroesophageal reflux disease)   . History of blood transfusion    after miscarrige  . History of kidney stones    x 2  . Hypertension   . Hypothyroidism   . Incontinent of urine    incontinent at night  . Kidney failure   . Memory loss    "after TIA"  . Pneumonia    as a child and teenager  . Renal disorder    stage IV kidney disease  . Stroke ([place])   . Thyroid disease   . TIA (transient ischemic attack) 06/2018   Past Surgical History:  Past Surgical History:  Procedure Laterality Date  . CHOLECYSTECTOMY    . DILATION AND CURETTAGE OF UTERUS    . EYE SURGERY Right    retina surgery  . KNEE SURGERY     bilat knees  . LUMBAR LAMINECTOMY/DECOMPRESSION MICRODISCECTOMY Right 04/12/2018   Procedure: MICRODISCECTOMY LUMBAR FOUR- LUMBAR FIVE;  Surgeon: Newman Pies, MD;  Location: [facility];  Service: Neurosurgery;  Laterality: Right;  MICRODISCECTOMY LUMBAR FOUR- LUMBAR FIVE  . TONSILLECTOMY     HPI:  Pt is a 74 year old woman admitted on 02/16/20 with 1-2 month mental and functional decline, falls and debility. Brain MRI negative for acute infarct, positive for chronic ischemia, and atrophy. PMH: GERD, esophageal dysmotility, small hiatal hernia, pna, CVA with residual memory loss per pt, hypothyroidism, CKD IV, arthritis, LE neuropathy.    Assessment / Plan / Recommendation Clinical Impression  No indications of aspiration with thin via straw and solid texture. Oral motor examination unremarkable. Pt alert, verbalizing in strong  vocal quality. Reviewed her history of esophageal dysmotility and GERD and precautions to mitigate symptoms. Pt verbalized additional techniques. Continue regular, thin liquids, straws, pills with water. No further ST needed.     SLP Visit Diagnosis: Dysphagia, unspecified (R13.10)    Aspiration Risk  Mild aspiration risk    Diet Recommendation Regular;Thin liquid   Liquid Administration via: Straw;Cup Medication Administration: Whole meds with liquid Supervision: Patient able to self feed Compensations: Small sips/bites;Slow rate Postural Changes: Remain upright for at least 30 minutes after po intake;Seated upright at 90 degrees    Other  Recommendations Oral Care Recommendations: Oral care BID   Follow up Recommendations None      Frequency and Duration            Prognosis        Swallow Study   General HPI: Pt is a 74 year old woman admitted on 02/16/20 with 1-2 month mental and functional decline, falls and debility. Brain MRI negative for acute infarct, positive for chronic ischemia, and atrophy. PMH: GERD, esophageal dysmotility, small hiatal hernia, pna, CVA with residual memory loss per pt, hypothyroidism, CKD IV, arthritis, LE neuropathy.  Type of Study: Bedside Swallow Evaluation Previous Swallow Assessment: (se HPI) Diet Prior to this Study: Regular;Thin liquids Temperature Spikes Noted: No Respiratory Status: Room air History of Recent Intubation: No Behavior/Cognition: Alert;Cooperative;Pleasant mood Oral Cavity Assessment: Within Functional Limits Oral Care Completed  by SLP: No Oral Cavity - Dentition: Adequate natural dentition Vision: Functional for self-feeding Self-Feeding Abilities: Able to feed self Patient Positioning: Upright in chair Baseline Vocal Quality: Normal Volitional Cough: Strong Volitional Swallow: Able to elicit    Oral/Motor/Sensory Function Overall Oral Motor/Sensory Function: Within functional limits   Ice Chips Ice chips: Not  tested   Thin Liquid Thin Liquid: Within functional limits Presentation: Straw    Nectar Thick Nectar Thick Liquid: Not tested   Honey Thick Honey Thick Liquid: Not tested   Puree Puree: Not tested   Solid     Solid: Within functional limits      Carolyn Robertson 02/17/2020,1:59 PM Carolyn Robertson [place].Ed Risk analyst [phone] Office [phone]

## 2020-02-17 NOTE — Progress Notes (Addendum)
PROGRESS NOTE  Carolyn Robertson  ZHY:[id number] DOB: 1946-07-24 DOA: 02/15/2020 PCP: Burnard Bunting, MD  Brief Narrative: Carolyn Robertson is a 74 y.o. female with a history of stroke, 1-2 month progressive mental and functional decline, presented for inability to get up from seating. Work up showed stable vital signs, WBC 12.8k, SCr 2.2, pyuria, bacteriuria. CT head and MRI brain revealed atrophy, chronic ischemia, but no acute infarcts. Plan was to discharge home, though she was unable to ambulate even with assistance. She was admitted for debility and UTI due to unsafe discharge plan. PT recommends SNF which is being pursued. Renal function has improved and mentation has cleared when holding sedating medications.  Assessment & Plan: Principal Problem:   Debility Active Problems:   Stage 4 chronic kidney disease (HCC)   Hypothyroidism   Acute cystitis   Elevated BP without diagnosis of hypertension  Generalized weakness, debility: Suspect mostly due to muscular deconditioning (in bed 18-20 hours/day, up in wheelchair/carrier otherwise).  - PT/OT consulted: Will pursue SNF placement.  Confusion: Suspect vascular dementia with progressive, stepwise decline worsening over past couple months. CT and MRI do not reveal acute stroke. Could also be related to polypharmacy.  - Delirium precautions, consider outpatient neuropsychiatry evaluation. - Minimize sedating medications as much as possible.  History of CVA:  - Continue aspirin. Not on a statin though has no noted allergy. Pt doesn't want to take anything for this.   GNR UTI, recurrent:  - Continue CTX x3 days pending culture.   Chronic pain, osteoarthritis:  - Continue home prednisone 5mg  BID - Continue cymbalta.  - Continue hydrocodone (reorder). Will watch mental status as she is much more lucid today than previously and holding medications was the major intervention.   GERD:  - Continue PPI  Hypothyroidism:  - Continue  synthroid. Free T4 wnl.  Stage IV CKD: Appears to be near baseline. Creatinine improving, so may have had an element of dehydration. - Continue monitoring, avoiding nephrotoxins.   Elevated BP:  - Norvasc 5mg  started at admission, BP normal/slightly elevated. Continue monitoring.   On estrogen: Will hold and watch for withdrawal VB, to reduce risk of DVT.  DVT prophylaxis: Lovenox Code Status: Full Family Communication: Husband called at cell and home numbers without answer Disposition Plan:  Status is: Inpatient  Remains inpatient appropriate because:Unsafe d/c plan   Dispo: The patient is from: Home              Anticipated d/c is to: SNF              Anticipated d/c date is: 1 day              Patient currently is medically stable to d/c.  Consultants:   None  Procedures:   None  Antimicrobials:  Ceftriaxone   Subjective: Much more clear-headed this morning, got up to chair with reports of moderate-severe back pain consistent with chronic back pain. Take hydrocodone at home. No numbness, incontinence. Denies fevers.  Objective: Vitals:   02/16/20 2002 02/16/20 2335 02/17/20 0424 02/17/20 0741  BP: (!) 160/59 134/71 137/81 (!) 145/71  Pulse: 92 88 81 78  Resp:  18 18 18   Temp:  98.1 F (36.7 C) 98 F (36.7 C) 98 F (36.7 C)  TempSrc:  Oral Oral   SpO2:  98% 97% 95%  Weight:      Height:        Intake/Output Summary (Last 24 hours) at 02/17/2020 1425 Last data filed  at 02/17/2020 0425 Gross per 24 hour  Intake 100 ml  Output 400 ml  Net -300 ml   Filed Weights   02/16/20 1422  Weight: 59 kg   Gen: 74 y.o. female in no distress Pulm: Non-labored breathing room air. Clear to auscultation bilaterally.  CV: Regular rate and rhythm. No murmur, rub, or gallop. No JVD, no pitting pedal edema. GI: Abdomen soft, non-tender, non-distended, with normoactive bowel sounds. No organomegaly or masses felt. Ext: Warm, no deformities Skin: No rashes, lesions or  ulcers other than some ecchymoses that are stable. Neuro: Alert and oriented. No focal neurological deficits. Psych: Judgement and insight appear fair. Mood & affect appropriate.   Data Reviewed: I have personally reviewed following labs and imaging studies  CBC: Recent Labs  Lab 02/15/20 1510 02/16/20 1003  WBC 12.8* 13.3*  NEUTROABS 9.9*  --   HGB 13.0 12.5  HCT 44.2 41.9  MCV 91.7 90.3  PLT 461* 867*   Basic Metabolic Panel: Recent Labs  Lab 02/15/20 1510 02/16/20 1003 02/17/20 0330  NA 144 142 137  K 3.9 4.2 4.2  CL 111 111 109  CO2 22 20* 20*  GLUCOSE 110* 94 111*  BUN 19 20 23   CREATININE 2.20* 2.06* 1.95*  CALCIUM 9.6 9.1 8.7*   GFR: Estimated Creatinine Clearance: 22.4 mL/min (A) (by C-G formula based on SCr of 1.95 mg/dL (H)). Liver Function Tests: Recent Labs  Lab 02/15/20 1510 02/16/20 1003  AST 15 17  ALT 9 10  ALKPHOS 64 60  BILITOT 0.9 0.7  PROT 6.1* 6.0*  ALBUMIN 3.1* 3.0*   No results for input(s): LIPASE, AMYLASE in the last 168 hours. No results for input(s): AMMONIA in the last 168 hours. Coagulation Profile: Recent Labs  Lab 02/15/20 1510  INR 1.1   Cardiac Enzymes: No results for input(s): CKTOTAL, CKMB, CKMBINDEX, TROPONINI in the last 168 hours. BNP (last 3 results) No results for input(s): PROBNP in the last 8760 hours. HbA1C: No results for input(s): HGBA1C in the last 72 hours. CBG: No results for input(s): GLUCAP in the last 168 hours. Lipid Profile: No results for input(s): CHOL, HDL, LDLCALC, TRIG, CHOLHDL, LDLDIRECT in the last 72 hours. Thyroid Function Tests: Recent Labs    02/16/20 0745  TSH 6.616*  FREET4 0.86   Anemia Panel: No results for input(s): VITAMINB12, FOLATE, FERRITIN, TIBC, IRON, RETICCTPCT in the last 72 hours. Urine analysis:    Component Value Date/Time   COLORURINE YELLOW 02/15/2020 2147   APPEARANCEUR CLOUDY (A) 02/15/2020 2147   LABSPEC 1.013 02/15/2020 2147   PHURINE 5.0 02/15/2020  2147   GLUCOSEU NEGATIVE 02/15/2020 2147   HGBUR MODERATE (A) 02/15/2020 2147   BILIRUBINUR NEGATIVE 02/15/2020 2147   KETONESUR 5 (A) 02/15/2020 2147   PROTEINUR 100 (A) 02/15/2020 2147   UROBILINOGEN 0.2 07/22/2013 1141   NITRITE NEGATIVE 02/15/2020 2147   LEUKOCYTESUR LARGE (A) 02/15/2020 2147   Recent Results (from the past 240 hour(s))  Urine culture     Status: Abnormal (Preliminary result)   Collection Time: 02/15/20 11:14 PM   Specimen: Urine, Random  Result Value Ref Range Status   Specimen Description URINE, RANDOM  Final   Special Requests   Final    NONE Performed at [facility], [address] [place], [place] [address]    Culture >=100,000 COLONIES/mL GRAM NEGATIVE RODS (A)  Final   Report Status PENDING  Incomplete  Respiratory Panel by RT PCR (Flu A&B, Covid) - Nasopharyngeal Swab  Status: None   Collection Time: 02/16/20  1:00 AM   Specimen: Nasopharyngeal Swab  Result Value Ref Range Status   SARS Coronavirus 2 by RT PCR NEGATIVE NEGATIVE Final    Comment: (NOTE) SARS-CoV-2 target nucleic acids are NOT DETECTED. The SARS-CoV-2 RNA is generally detectable in upper respiratoy specimens during the acute phase of infection. The lowest concentration of SARS-CoV-2 viral copies this assay can detect is 131 copies/mL. A negative result does not preclude SARS-Cov-2 infection and should not be used as the sole basis for treatment or other patient management decisions. A negative result may occur with  improper specimen collection/handling, submission of specimen other than nasopharyngeal swab, presence of viral mutation(s) within the areas targeted by this assay, and inadequate number of viral copies (<131 copies/mL). A negative result must be combined with clinical observations, patient history, and epidemiological information. The expected result is Negative. Fact Sheet for Patients:  PinkCheek.be Fact Sheet for Healthcare  Providers:  GravelBags.it This test is not yet ap proved or cleared by the [place] FDA and  has been authorized for detection and/or diagnosis of SARS-CoV-2 by FDA under an Emergency Use Authorization (EUA). This EUA will remain  in effect (meaning this test can be used) for the duration of the COVID-19 declaration under Section 564(b)(1) of the Act, 21 U.S.C. section 360bbb-3(b)(1), unless the authorization is terminated or revoked sooner.    Influenza A by PCR NEGATIVE NEGATIVE Final   Influenza B by PCR NEGATIVE NEGATIVE Final    Comment: (NOTE) The Xpert Xpress SARS-CoV-2/FLU/RSV assay is intended as an aid in  the diagnosis of influenza from Nasopharyngeal swab specimens and  should not be used as a sole basis for treatment. Nasal washings and  aspirates are unacceptable for Xpert Xpress SARS-CoV-2/FLU/RSV  testing. Fact Sheet for Patients: PinkCheek.be Fact Sheet for Healthcare Providers: GravelBags.it This test is not yet approved or cleared by the [place] FDA and  has been authorized for detection and/or diagnosis of SARS-CoV-2 by  FDA under an Emergency Use Authorization (EUA). This EUA will remain  in effect (meaning this test can be used) for the duration of the  Covid-19 declaration under Section 564(b)(1) of the Act, 21  U.S.C. section 360bbb-3(b)(1), unless the authorization is  terminated or revoked. Performed at [facility], [place] [address], [place], [place] [address]       Radiology Studies: CT HEAD WO CONTRAST  Result Date: 02/15/2020 CLINICAL DATA:  Transient ischemic attack. Facial droop. Arm drift. History of stroke. EXAM: CT HEAD WITHOUT CONTRAST TECHNIQUE: Contiguous axial images were obtained from the base of the skull through the vertex without intravenous contrast. COMPARISON:  07/23/2019 FINDINGS: Brain: No evidence of acute infarction, hemorrhage,  hydrocephalus, extra-axial collection or mass lesion/mass effect. Extensive atrophy and chronic microvascular ischemic changes are again noted. Vascular: No hyperdense vessel or unexpected calcification. Skull: Normal. Negative for fracture or focal lesion. Sinuses/Orbits: No acute finding. Other: None. IMPRESSION: 1. No acute intracranial abnormality. 2. Extensive atrophy and chronic microvascular ischemic changes. Electronically Signed   By: Constance Holster M.D.   On: 02/15/2020 18:54   MR BRAIN WO CONTRAST  Result Date: 02/15/2020 CLINICAL DATA:  Acute neuro deficit.  Facial droop and arm drift. EXAM: MRI HEAD WITHOUT CONTRAST TECHNIQUE: Multiplanar, multiecho pulse sequences of the brain and surrounding structures were obtained without intravenous contrast. COMPARISON:  CT head 02/15/2020.  MRI head 03/26/2019. FINDINGS: Brain: Negative for acute infarct. Moderate to advanced atrophy. Chronic microvascular ischemic changes  throughout the white matter and pons. Small chronic infarcts in the cerebellum bilaterally. Negative for hemorrhage or mass. Vascular: Normal arterial flow voids. Skull and upper cervical spine: No focal skeletal lesion. Sinuses/Orbits: Mucosal edema in the paranasal sinuses most notably in the sphenoid sinus. Bilateral cataract extraction Other: None IMPRESSION: Negative for acute infarct Stable changes of atrophy and chronic ischemia. Electronically Signed   By: Franchot Gallo M.D.   On: 02/15/2020 20:36    Scheduled Meds: . amLODipine  5 mg Oral Daily  . aspirin EC  81 mg Oral Daily  . febuxostat  40 mg Oral QHS  . feeding supplement (ENSURE ENLIVE)  237 mL Oral TID BM  . heparin  5,000 Units Subcutaneous Q8H  . [START ON 02/18/2020] levothyroxine  175 mcg Oral Once per day on Sun Tue Thu Sat  . levothyroxine  200 mcg Oral Once per day on Mon Wed Fri  . multivitamin with minerals  1 tablet Oral Daily  . pantoprazole  40 mg Oral QAC breakfast  . predniSONE  5 mg Oral BID WC     Continuous Infusions: . cefTRIAXone (ROCEPHIN)  IV 1 g (02/16/20 2011)     LOS: 1 day   Time spent: 25 minutes.  Patrecia Pour, MD [facility] [email or web address] 02/17/2020, 2:25 PM

## 2020-02-18 DIAGNOSIS — E039 Hypothyroidism, unspecified: Secondary | ICD-10-CM | POA: Diagnosis not present

## 2020-02-18 DIAGNOSIS — R41841 Cognitive communication deficit: Secondary | ICD-10-CM | POA: Diagnosis not present

## 2020-02-18 DIAGNOSIS — R4182 Altered mental status, unspecified: Secondary | ICD-10-CM | POA: Diagnosis not present

## 2020-02-18 DIAGNOSIS — R278 Other lack of coordination: Secondary | ICD-10-CM | POA: Diagnosis not present

## 2020-02-18 DIAGNOSIS — M6259 Muscle wasting and atrophy, not elsewhere classified, multiple sites: Secondary | ICD-10-CM | POA: Diagnosis not present

## 2020-02-18 DIAGNOSIS — R2689 Other abnormalities of gait and mobility: Secondary | ICD-10-CM | POA: Diagnosis not present

## 2020-02-18 DIAGNOSIS — R1312 Dysphagia, oropharyngeal phase: Secondary | ICD-10-CM | POA: Diagnosis not present

## 2020-02-18 DIAGNOSIS — Z515 Encounter for palliative care: Secondary | ICD-10-CM

## 2020-02-18 DIAGNOSIS — N390 Urinary tract infection, site not specified: Secondary | ICD-10-CM | POA: Diagnosis not present

## 2020-02-18 DIAGNOSIS — M1A9XX0 Chronic gout, unspecified, without tophus (tophi): Secondary | ICD-10-CM | POA: Diagnosis not present

## 2020-02-18 DIAGNOSIS — R52 Pain, unspecified: Secondary | ICD-10-CM | POA: Diagnosis not present

## 2020-02-18 DIAGNOSIS — R2681 Unsteadiness on feet: Secondary | ICD-10-CM | POA: Diagnosis not present

## 2020-02-18 DIAGNOSIS — I499 Cardiac arrhythmia, unspecified: Secondary | ICD-10-CM | POA: Diagnosis not present

## 2020-02-18 DIAGNOSIS — B961 Klebsiella pneumoniae [K. pneumoniae] as the cause of diseases classified elsewhere: Secondary | ICD-10-CM | POA: Diagnosis not present

## 2020-02-18 DIAGNOSIS — R29898 Other symptoms and signs involving the musculoskeletal system: Secondary | ICD-10-CM | POA: Diagnosis not present

## 2020-02-18 DIAGNOSIS — M6281 Muscle weakness (generalized): Secondary | ICD-10-CM | POA: Diagnosis not present

## 2020-02-18 DIAGNOSIS — G894 Chronic pain syndrome: Secondary | ICD-10-CM | POA: Diagnosis not present

## 2020-02-18 DIAGNOSIS — K219 Gastro-esophageal reflux disease without esophagitis: Secondary | ICD-10-CM | POA: Diagnosis not present

## 2020-02-18 DIAGNOSIS — N184 Chronic kidney disease, stage 4 (severe): Secondary | ICD-10-CM | POA: Diagnosis not present

## 2020-02-18 DIAGNOSIS — G8929 Other chronic pain: Secondary | ICD-10-CM | POA: Diagnosis not present

## 2020-02-18 DIAGNOSIS — R531 Weakness: Secondary | ICD-10-CM

## 2020-02-18 DIAGNOSIS — N3000 Acute cystitis without hematuria: Secondary | ICD-10-CM | POA: Diagnosis not present

## 2020-02-18 DIAGNOSIS — I872 Venous insufficiency (chronic) (peripheral): Secondary | ICD-10-CM | POA: Diagnosis not present

## 2020-02-18 DIAGNOSIS — Z7401 Bed confinement status: Secondary | ICD-10-CM | POA: Diagnosis not present

## 2020-02-18 DIAGNOSIS — M1991 Primary osteoarthritis, unspecified site: Secondary | ICD-10-CM | POA: Diagnosis not present

## 2020-02-18 DIAGNOSIS — M2550 Pain in unspecified joint: Secondary | ICD-10-CM | POA: Diagnosis not present

## 2020-02-18 DIAGNOSIS — M1711 Unilateral primary osteoarthritis, right knee: Secondary | ICD-10-CM | POA: Diagnosis not present

## 2020-02-18 DIAGNOSIS — R5381 Other malaise: Secondary | ICD-10-CM

## 2020-02-18 DIAGNOSIS — M1990 Unspecified osteoarthritis, unspecified site: Secondary | ICD-10-CM | POA: Diagnosis not present

## 2020-02-18 DIAGNOSIS — Z20828 Contact with and (suspected) exposure to other viral communicable diseases: Secondary | ICD-10-CM | POA: Diagnosis not present

## 2020-02-18 DIAGNOSIS — R54 Age-related physical debility: Secondary | ICD-10-CM | POA: Diagnosis not present

## 2020-02-18 DIAGNOSIS — R030 Elevated blood-pressure reading, without diagnosis of hypertension: Secondary | ICD-10-CM | POA: Diagnosis not present

## 2020-02-18 DIAGNOSIS — M4316 Spondylolisthesis, lumbar region: Secondary | ICD-10-CM | POA: Diagnosis not present

## 2020-02-18 LAB — URINE CULTURE: Culture: >=100000 — AB

## 2020-02-18 MED ORDER — HYDROCODONE-ACETAMINOPHEN 5-325 MG PO TABS: 1.0000 | ORAL_TABLET | Freq: Four times a day (QID) | ORAL | 0 refills | Status: DC | PRN

## 2020-02-18 NOTE — Consult Note (Signed)
Consultation Note Date: 02/18/2020   Patient Name: Carolyn Robertson  DOB: 02-12-1946  MRN: [id number]  Age / Sex: 74 y.o., female  PCP: Burnard Bunting, MD Referring Physician: Patrecia Pour, MD  Reason for Consultation: Establishing goals of care  HPI/Patient Profile: 74 y.o. female  admitted on 02/15/2020 with a past medical history significant for hypothyroidism, stage IV chronic kidney disease/discussion regarding transplant noted in chart, and reported continued physical and functional decline in the home over the last several months.   Work-up significant for elevated WBCs, pyuria, bacteremia.  Head CT and MRI of the brain significant for atrophy and chronic ischemia changes.  Patient admitted for treatment and stabilization.  Patient and family face treatment option decisions, advanced directive decisions and anticipatory care needs  Clinical Assessment and Goals of Care:  This NP Wadie Lessen reviewed medical records, received report from team, assessed the patient and then meet at the patient's bedside along with her husband/Carolyn Robertson  to discuss diagnosis, prognosis, GOC,  disposition and options.  Education offered on the role of Palliative Medicine  in a holistic treatment plan.   Education offered on concept specific to failure to thrive in a body with multiple chronic medical conditions, And the limitations of medical interventions to prolong quality of life when a body does begin to fail to thrive.  A  discussion was had today regarding advanced directives.  Concepts specific to code status, artifical feeding and hydration, continued IV antibiotics and rehospitalization was had.  Values and goals of care important to patient and family were attempted to be elicited.  MOST form introduced.   Questions and concerns addressed.   Family/patient  encouraged to call with questions or concerns.    PMT  will continue to support holistically.    Husband verbalizes that there is a previously documented  AD and HPOA.   Encouraged him to bring to hospital for scanning      SUMMARY OF RECOMMENDATIONS    Code Status/Advance Care Planning:  DNR/DNI-documetned today.  Patient clearly verbalizes her understanding and desire for a DNR/DNI and husband supports this decision.   Patient verbalizes no desire for artifical feeding now or in the future.    Symptom Management:   Weakness: Recommend ongoing rehabilition, family is hopeful for CIR  Eduction offered and demonstrated on AROM  exercises for upper body strengthening  Palliative Prophylaxis:   Bowel Regimen, Delirium Protocol and Frequent Pain Assessment  Additional Recommendations (Limitations, Scope, Preferences):  Full Scope Treatment  Psycho-social/Spiritual:   Desire for further Chaplaincy support:declined  Created space and opportunity for patient and her husband to explore their thoughts and feeling regarding patient's current medical situation.  Emotional support offered.   Prognosis:   Unable to determine  Discharge Planning: - to be determined       Recommend palliative OP community based services at facility  Both patient and her husband verbalize concerns around increasing care needs in the home.   Husband has physical limitations of his own, "I can't take of  her at home right now I just can't do it".     Patient and husband recognize need for rehabilitation, outstanding decision is if patient will be eligible for CIR.  If not they will choose form other bed offers.  They are personally reaching out to Dr Aronson/PCP for help in securing a bed at [facility]  Daughter lives in [place]      Primary Diagnoses: Present on Admission: . Debility . Stage 4 chronic kidney disease ([place])   I have reviewed the medical record, interviewed the patient and family, and examined the patient. The following aspects  are pertinent.  Past Medical History:  Diagnosis Date  . Anxiety   . Arthritis    RA  . 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 Depression   . GERD (gastroesophageal reflux disease)   . History of blood transfusion    after miscarrige  . History of kidney stones    x 2  . Hypertension   . Hypothyroidism   . Incontinent of urine    incontinent at night  . Kidney failure   . Memory loss    "after TIA"  . Pneumonia    as a child and teenager  . Renal disorder    stage IV kidney disease  . Stroke ([place])   . Thyroid disease   . TIA (transient ischemic attack) 06/2018   Social History   Socioeconomic History  . Marital status: Married    Spouse name: Not on file  . Number of children: Not on file  . Years of education: Not on file  . Highest education level: Not on file  Occupational History  . Not on file  Tobacco Use  . Smoking status: Never Smoker  . Smokeless tobacco: Never Used  Substance and Sexual Activity  . Alcohol use: No    Comment: rarely  . Drug use: No  . Sexual activity: Not on file  Other Topics Concern  . Not on file  Social History Narrative  . Not on file   Social Determinants of Health   Financial Resource Strain:   . Difficulty of Paying Living Expenses:   Food Insecurity:   . Worried About Charity fundraiser in the Last Year:   . Arboriculturist in the Last Year:   Transportation Needs:   . Film/video editor (Medical):   Marland Kitchen Lack of Transportation (Non-Medical):   Physical Activity:   . Days of Exercise per Week:   . Minutes of Exercise per Session:   Stress:   . Feeling of Stress :   Social Connections:   . Frequency of Communication with Friends and Family:   . Frequency of Social Gatherings with Friends and Family:   . Attends Religious Services:   . Active Member of Clubs or Organizations:   . Attends Archivist Meetings:   Marland Kitchen Marital Status:    No family history on file. Scheduled  Meds: . amLODipine  5 mg Oral Daily  . aspirin EC  81 mg Oral Daily  . febuxostat  40 mg Oral QHS  . feeding supplement (ENSURE ENLIVE)  237 mL Oral TID BM  . heparin  5,000 Units Subcutaneous Q8H  . levothyroxine  175 mcg Oral Once per day on Sun Tue Thu Sat  . levothyroxine  200 mcg Oral Once per day on Mon Wed Fri  . multivitamin with minerals  1 tablet Oral Daily  . pantoprazole  40  mg Oral QAC breakfast  . predniSONE  5 mg Oral BID WC   Continuous Infusions: PRN Meds:.acetaminophen, furosemide, HYDROcodone-acetaminophen Medications Prior to Admission:  Prior to Admission medications   Medication Sig Start Date End Date Taking? Authorizing Provider  aspirin 81 MG EC tablet Take 1 tablet (81 mg total) by mouth daily. 06/27/18  Yes Buriev, Arie Sabina, MD  baclofen (LIORESAL) 10 MG tablet Take 10 mg by mouth 2 (two) times daily as needed for muscle spasms. 02/12/20  Yes [provider]  cyanocobalamin (,VITAMIN B-12,) 1000 MCG/ML injection Inject 1 mL into the skin every 28 (twenty-eight) days. 05/08/19  Yes [provider]  DULoxetine (CYMBALTA) 60 MG capsule Take 60 mg by mouth at bedtime.   Yes [provider]  estrogen, conjugated,-medroxyprogesterone (PREMPRO) 0.625-2.5 MG per tablet Take 1 tablet by mouth daily.   Yes [provider]  Febuxostat 80 MG TABS Take 40 mg by mouth at bedtime.    Yes [provider]  furosemide (LASIX) 40 MG tablet Take 40 mg by mouth daily as needed for edema.    Yes [provider]  HYDROcodone-acetaminophen (NORCO/VICODIN) 5-325 MG tablet Take 1 tablet by mouth every 4 (four) hours as needed for moderate pain.    Yes [provider]  levothyroxine (SYNTHROID) 200 MCG tablet Take 200 mcg by mouth See admin instructions. Take 253mcg by mouth on Monday, Wednesday and Friday 05/28/19  Yes [provider]  nitrofurantoin (MACRODANTIN) 100 MG capsule Take 100 mg by mouth daily. Started on  01-31-20 01/31/20  Yes [provider]  nystatin cream (MYCOSTATIN) Apply to affected area 2 times daily 04/12/19  Yes Antonietta Breach, PA-C  oxyCODONE-acetaminophen (PERCOCET/ROXICET) 5-325 MG tablet Take 1 tablet by mouth 2 (two) times daily as needed for pain. 02/12/20  Yes [provider]  pantoprazole (PROTONIX) 40 MG tablet Take 40 mg by mouth daily before breakfast. 02/22/18  Yes [provider]  SYNTHROID 175 MCG tablet Take 175 mcg by mouth daily before breakfast. Take 173mcg by mouth on Tuesdays, Thursdays, Saturdays and Sundays 01/05/18  Yes [provider]  ciprofloxacin (CIPRO) 250 MG tablet Take 1 tablet (250 mg total) by mouth every 12 (twelve) hours. 02/15/20   Virgel Manifold, MD  cyclobenzaprine (FLEXERIL) 10 MG tablet Take 1 tablet (10 mg total) by mouth 3 (three) times daily as needed for muscle spasms. Patient not taking: Reported on 04/12/2019 02/21/19   Viona Gilmore D, NP  docusate sodium (COLACE) 100 MG capsule Take 1 capsule (100 mg total) by mouth 2 (two) times daily. Patient not taking: Reported on 03/26/2019 02/21/19   Viona Gilmore D, NP  HYDROcodone-acetaminophen (NORCO) 10-325 MG tablet Take 1 tablet by mouth every 4 (four) hours as needed for moderate pain. Patient not taking: Reported on 02/16/2020 02/21/19   Viona Gilmore D, NP  HYDROcodone-acetaminophen (NORCO/VICODIN) 5-325 MG tablet Take 1 tablet by mouth every 4 (four) hours as needed for pain. 06/14/19   [provider]  trimethoprim (TRIMPEX) 100 MG tablet Take 100 mg by mouth daily. 07/17/19   [provider]   Allergies  Allergen Reactions  . Other Swelling    A bandage/gauze that was used on lower extremity wound at [facility], unsure type- Pt states her foot turned red and swelled up  . Penicillins Swelling and Rash    Has patient had a PCN reaction causing immediate rash, facial/tongue/throat swelling, SOB or lightheadedness with hypotension: Unknown Has patient  had a PCN reaction causing  severe rash involving mucus membranes or skin necrosis: Unknown Has patient had a PCN reaction that required hospitalization: No Has patient had a PCN reaction occurring within the last 10 years: No If all of the above answers are "NO", then may proceed with Cephalosporin use.   . Sulfa Antibiotics Itching and Swelling  . Codeine Nausea Only  . Hydrocodone-Acetaminophen Other (See Comments)    Causes hallucinations  . Naproxen Sodium Swelling and Other (See Comments)    (ALEVE) Red Man Syndrome  . Tape Rash and Other (See Comments)    BURNS SKIN --PAPER TAPE IS OKAY   Review of Systems  Constitutional: Positive for fatigue.  Gastrointestinal:       -generalized abdominal discomfort  Neurological: Positive for weakness.    Physical Exam Constitutional:      Appearance: She is normal weight.  Cardiovascular:     Rate and Rhythm: Normal rate.  Musculoskeletal:     Comments: - generalized weakness  Skin:    General: Skin is warm and dry.  Neurological:     Mental Status: She is alert and oriented to person, place, and time.     Vital Signs: BP (!) 155/81 (BP Location: Left Arm)   Pulse 72   Temp 97.8 F (36.6 C)   Resp 16   Ht 5\' 5"  (1.651 m)   Wt 59 kg   LMP  (LMP Unknown)   SpO2 98%   BMI 21.63 kg/m  Pain Scale: 0-10   Pain Score: 6    SpO2: SpO2: 98 % O2 Device:SpO2: 98 % O2 Flow Rate: .   IO: Intake/output summary:   Intake/Output Summary (Last 24 hours) at 02/18/2020 0959 Last data filed at 02/18/2020 0500 Gross per 24 hour  Intake 100 ml  Output 400 ml  Net -300 ml    LBM: Last BM Date: 02/17/20 Baseline Weight: Weight: 59 kg Most recent weight: Weight: 59 kg     Palliative Assessment/Data: 40 %   Discussed with Dr Bonner Puna   Time In: 0900 Time Out: 1015 Time Total: 75 minutes Greater than 50%  of this time was spent counseling and coordinating care related to the above assessment and plan.  Signed by: Wadie Lessen,  NP   Please contact Palliative Medicine Team phone at [phone] for questions and concerns.  For individual provider: See Shea Evans

## 2020-02-18 NOTE — TOC Transition Note (Signed)
Transition of Care [facility]) - CM/SW Discharge Note   Patient Details  Name: Carolyn Robertson MRN: [id number] Date of Birth: [birth year]  Transition of Care [facility]) CM/SW Contact:  Jacquelynn Cree Phone Number: 02/18/2020, 1:34 PM   Clinical Narrative:    Patient will DC to: [facility] Anticipated DC date: 02/18/2020 Family notified: [name] Transport by: Corey Harold   Per MD patient ready for DC to [facility]. RN, patient, patient's family, and facility notified of DC. Discharge Summary and FL2 sent to facility. RN to call report prior to discharge [phone] Room 217). DC packet on chart. Ambulance transport requested for patient.   CSW will sign off for now as social work intervention is no longer needed. Please consult [place] again if new needs arise.    Final next level of care: Skilled Nursing Facility Barriers to Discharge: No Barriers Identified   Patient Goals and CMS Choice Patient states their goals for this hospitalization and ongoing recovery are:: to return home CMS Medicare.gov Compare Post Acute Care list provided to:: Patient Choice offered to / list presented to : Patient  Discharge Placement PASRR number recieved: 02/17/20            Patient chooses bed at: [facility] Patient to be transferred to facility by: [place] Name of family member notified: [name] Patient and family notified of of transfer: 02/18/20  Discharge Plan and Services In-house Referral: Clinical Social Work                                   Social Determinants of Health ([place]) Interventions     Readmission Risk Interventions No flowsheet data found.

## 2020-02-18 NOTE — Progress Notes (Signed)
Inpatient Rehabilitation Admissions Coordinator  I met at bedside with patient and her spouse. I recommended SNF rehab at this time  And they ar in agreement I have alerted , SW, [place].   Danne Baxter, RN, MSN Rehab Admissions Coordinator [phone] 02/18/2020 1:14 PM

## 2020-02-18 NOTE — Progress Notes (Signed)
Patient left facility in care of [facility] via stretcher.  She remained in stable condition oriented x 3 and moving all extremities.

## 2020-02-18 NOTE — Discharge Summary (Signed)
Physician Discharge Summary  Carolyn Robertson QQP:[id number] DOB: [birth year] DOA: 02/15/2020  PCP: Burnard Bunting, MD  Admit date: 02/15/2020 Discharge date: 02/18/2020  Admitted From: Home Disposition: SNF   Recommendations for Outpatient Follow-up:  1. Follow up with PCP in 1-2 weeks 2. consider palliative care following at SNF.  Home Health: N/A Equipment/Devices: Per SNF Discharge Condition: Stable CODE STATUS: Full Diet recommendation: As tolerated  Brief/Interim Summary: Carolyn Robertson a73 y.o.femalewith a history of stroke, 1-2 month progressive mental and functional decline, presented for inability to get up from seating. Work up showed stable vital signs, WBC 12.8k, SCr 2.2, pyuria, bacteriuria. CT head and MRI brain revealed atrophy, chronic ischemia, but no acute infarcts. Plan was to discharge home, though she was unable to ambulate even with assistance. She was admitted for debility and UTI due to unsafe discharge plan.PT recommends SNF which is being pursued. Renal function has improved and mentation has cleared with antibiotics and holding sedating medications. She continues to require rehabilitation and SNF is pursued.   Discharge Diagnoses:  Principal Problem:   Debility Active Problems:   Stage 4 chronic kidney disease (HCC)   Hypothyroidism   Acute cystitis   Elevated BP without diagnosis of hypertension  Generalized weakness, debility: Suspect mostly due to muscular deconditioning (in bed 18-20 hours/day, up in wheelchair/carrier otherwise).  - Will benefit from SNF placement.  Confusion: Suspect vascular dementia with progressive, stepwise decline worsening over past couple months.CT and MRI do not reveal acute stroke. Could also be related to polypharmacy.  - Delirium precautions, consider outpatient neuropsychiatry evaluation. - Minimize sedating medications as much as possible.  History of CVA:  - Continue aspirin. Not on a statin though has no  noted allergy. Pt doesn't want to take anything for this.  Klebsiella UTI, recurrent:  - Completed CTX x3 days - Can continue chronic trimethoprim (PTA medication), would avoid macrobid.  Chronic pain, osteoarthritis:  - Continue home prednisone 5mg  BID, recommend tapering if able. - Continue cymbalta.  - Continue hydrocodone   GERD:  - Continue PPI  Hypothyroidism:  - Continue synthroid. Free T4 wnl. Consider recheck TFTs.  Gout: No flare - Continue PTA medications.  Stage IV CKD: Appears to be near baseline. Creatinine improving, so may have had an element of dehydration. - Continue monitoring, avoiding nephrotoxins.  Elevated BP:  - Norvasc 5mg  given during admission, will not continue at this time, consider addition based on BP trends.  Discharge Instructions  Allergies as of 02/18/2020      Reactions   Other Swelling   A bandage/gauze that was used on lower extremity wound at [facility] health, unsure type- Pt states her foot turned red and swelled up   Penicillins Swelling, Rash   Has patient had a PCN reaction causing immediate rash, facial/tongue/throat swelling, SOB or lightheadedness with hypotension: Unknown Has patient had a PCN reaction causing severe rash involving mucus membranes or skin necrosis: Unknown Has patient had a PCN reaction that required hospitalization: No Has patient had a PCN reaction occurring within the last 10 years: No If all of the above answers are "NO", then may proceed with Cephalosporin use.   Sulfa Antibiotics Itching, Swelling   Codeine Nausea Only   Hydrocodone-acetaminophen Other (See Comments)   Causes hallucinations   Naproxen Sodium Swelling, Other (See Comments)   (ALEVE) Red Man Syndrome   Tape Rash, Other (See Comments)   BURNS SKIN --PAPER TAPE IS OKAY      Medication List  STOP taking these medications   cyclobenzaprine 10 MG tablet Commonly known as: FLEXERIL   docusate sodium 100 MG capsule Commonly known  as: COLACE   nitrofurantoin 100 MG capsule Commonly known as: MACRODANTIN   oxyCODONE-acetaminophen 5-325 MG tablet Commonly known as: PERCOCET/ROXICET     TAKE these medications   aspirin 81 MG EC tablet Take 1 tablet (81 mg total) by mouth daily.   baclofen 10 MG tablet Commonly known as: LIORESAL Take 10 mg by mouth 2 (two) times daily as needed for muscle spasms.   cyanocobalamin 1000 MCG/ML injection Commonly known as: (VITAMIN B-12) Inject 1 mL into the skin every 28 (twenty-eight) days.   DULoxetine 60 MG capsule Commonly known as: CYMBALTA Take 60 mg by mouth at bedtime.   estrogen (conjugated)-medroxyprogesterone 0.625-2.5 MG tablet Commonly known as: PREMPRO Take 1 tablet by mouth daily.   Febuxostat 80 MG Tabs Take 40 mg by mouth at bedtime.   furosemide 40 MG tablet Commonly known as: LASIX Take 40 mg by mouth daily as needed for edema.   HYDROcodone-acetaminophen 5-325 MG tablet Commonly known as: NORCO/VICODIN Take 1 tablet by mouth every 6 (six) hours as needed. What changed:   when to take this  reasons to take this  Another medication with the same name was removed. Continue taking this medication, and follow the directions you see here.   nystatin cream Commonly known as: MYCOSTATIN Apply to affected area 2 times daily   pantoprazole 40 MG tablet Commonly known as: PROTONIX Take 40 mg by mouth daily before breakfast.   Synthroid 175 MCG tablet Generic drug: levothyroxine Take 175 mcg by mouth daily before breakfast. Take 129mcg by mouth on Tuesdays, Thursdays, Saturdays and Sundays   levothyroxine 200 MCG tablet Commonly known as: SYNTHROID Take 200 mcg by mouth See admin instructions. Take 255mcg by mouth on Monday, Wednesday and Friday   trimethoprim 100 MG tablet Commonly known as: TRIMPEX Take 100 mg by mouth daily.       Contact information for follow-up providers    Burnard Bunting, MD.   Specialty: Internal  Medicine Contact information: [place] [address] [phone]            Contact information for after-discharge care    Destination    [facility] Preferred SNF .   Service: Skilled Nursing Contact information: [place] [phone]                 Allergies  Allergen Reactions  . Other Swelling    A bandage/gauze that was used on lower extremity wound at [facility] health, unsure type- Pt states her foot turned red and swelled up  . Penicillins Swelling and Rash    Has patient had a PCN reaction causing immediate rash, facial/tongue/throat swelling, SOB or lightheadedness with hypotension: Unknown Has patient had a PCN reaction causing severe rash involving mucus membranes or skin necrosis: Unknown Has patient had a PCN reaction that required hospitalization: No Has patient had a PCN reaction occurring within the last 10 years: No If all of the above answers are "NO", then may proceed with Cephalosporin use.   . Sulfa Antibiotics Itching and Swelling  . Codeine Nausea Only  . Hydrocodone-Acetaminophen Other (See Comments)    Causes hallucinations  . Naproxen Sodium Swelling and Other (See Comments)    (ALEVE) Red Man Syndrome  . Tape Rash and Other (See Comments)    BURNS SKIN --PAPER TAPE IS OKAY  Consultations:  Palliative care  Procedures/Studies: CT HEAD WO CONTRAST  Result Date: 02/15/2020 CLINICAL DATA:  Transient ischemic attack. Facial droop. Arm drift. History of stroke. EXAM: CT HEAD WITHOUT CONTRAST TECHNIQUE: Contiguous axial images were obtained from the base of the skull through the vertex without intravenous contrast. COMPARISON:  07/23/2019 FINDINGS: Brain: No evidence of acute infarction, hemorrhage, hydrocephalus, extra-axial collection or mass lesion/mass effect. Extensive atrophy and chronic microvascular ischemic changes are again noted. Vascular: No hyperdense  vessel or unexpected calcification. Skull: Normal. Negative for fracture or focal lesion. Sinuses/Orbits: No acute finding. Other: None. IMPRESSION: 1. No acute intracranial abnormality. 2. Extensive atrophy and chronic microvascular ischemic changes. Electronically Signed   By: Constance Holster M.D.   On: 02/15/2020 18:54   CT Knee Right Wo Contrast  Result Date: 02/10/2020 CLINICAL DATA:  Tibial plateau fracture. Right knee pain and swelling after fall 1 week ago. EXAM: CT OF THE RIGHT KNEE WITHOUT CONTRAST TECHNIQUE: Multidetector CT imaging of the RIGHT knee was performed according to the standard protocol. Multiplanar CT image reconstructions were also generated. COMPARISON:  Radiograph earlier this day. FINDINGS: Bones/Joint/Cartilage No evidence of acute fracture, with particular attention to the lateral tibial plateau. There is lateral tibiofemoral joint space narrowing with peripheral spurring which is partially fragmented prominent patellofemoral spurring with mild joint space narrowing. There is spurring of the posterior femoral condyles and tibial spines. Small knee joint effusion but no lipohemarthrosis. Ligaments Suboptimally assessed by CT. No ACL fibers are visualized. Muscles and Tendons No intramuscular hematoma. No significant muscle atrophy. Soft tissues No confluent soft tissue hematoma. IMPRESSION: 1. No evidence of acute fracture of the right knee, with particular attention to the lateral tibial plateau. 2. Tricompartmental osteoarthritis, prominent in the tibiofemoral lateral compartment. 3. Small joint effusion but no lipohemarthrosis to indicate intra-articular fracture. Electronically Signed   By: Keith Rake M.D.   On: 02/10/2020 20:24   MR BRAIN WO CONTRAST  Result Date: 02/15/2020 CLINICAL DATA:  Acute neuro deficit.  Facial droop and arm drift. EXAM: MRI HEAD WITHOUT CONTRAST TECHNIQUE: Multiplanar, multiecho pulse sequences of the brain and surrounding structures were  obtained without intravenous contrast. COMPARISON:  CT head 02/15/2020.  MRI head 03/26/2019. FINDINGS: Brain: Negative for acute infarct. Moderate to advanced atrophy. Chronic microvascular ischemic changes throughout the white matter and pons. Small chronic infarcts in the cerebellum bilaterally. Negative for hemorrhage or mass. Vascular: Normal arterial flow voids. Skull and upper cervical spine: No focal skeletal lesion. Sinuses/Orbits: Mucosal edema in the paranasal sinuses most notably in the sphenoid sinus. Bilateral cataract extraction Other: None IMPRESSION: Negative for acute infarct Stable changes of atrophy and chronic ischemia. Electronically Signed   By: Franchot Gallo M.D.   On: 02/15/2020 20:36   DG Knee Complete 4 Views Right  Result Date: 02/10/2020 CLINICAL DATA:  Right knee pain and swelling after fall 1 week ago EXAM: RIGHT KNEE - COMPLETE 4+ VIEW COMPARISON:  07/23/2019 FINDINGS: Subtle cortical disruption of the lateral cortex of the lateral tibial plateau with a suspected nondisplaced intra-articular component. Small knee joint effusion. Tricompartmental osteoarthritis, most severe within the lateral compartment. IMPRESSION: 1. Suspected nondisplaced lateral tibial plateau fracture. CT could be performed for confirmation, as clinically indicated. 2. Small knee joint effusion. Electronically Signed   By: Davina Poke D.O.   On: 02/10/2020 13:58      Subjective: Much more alert, eating well, no pain. Pain is reasonably controlled.  Discharge Exam: Vitals:   02/18/20 0545 02/18/20 0750  BP: Marland Kitchen)  156/90 (!) 155/81  Pulse: 70 72  Resp: 18 16  Temp: 97.7 F (36.5 C) 97.8 F (36.6 C)  SpO2: 100% 98%   General: Pt is alert, awake, not in acute distress Cardiovascular: RRR, S1/S2 +, no rubs, no gallops Respiratory: CTA bilaterally, no wheezing, no rhonchi Abdominal: Soft, NT, ND, bowel sounds + Extremities: No edema, no cyanosis  Labs: BNP (last 3 results) No results  for input(s): BNP in the last 8760 hours. Basic Metabolic Panel: Recent Labs  Lab 02/15/20 1510 02/16/20 1003 02/17/20 0330  NA 144 142 137  K 3.9 4.2 4.2  CL 111 111 109  CO2 22 20* 20*  GLUCOSE 110* 94 111*  BUN 19 20 23   CREATININE 2.20* 2.06* 1.95*  CALCIUM 9.6 9.1 8.7*   Liver Function Tests: Recent Labs  Lab 02/15/20 1510 02/16/20 1003  AST 15 17  ALT 9 10  ALKPHOS 64 60  BILITOT 0.9 0.7  PROT 6.1* 6.0*  ALBUMIN 3.1* 3.0*   No results for input(s): LIPASE, AMYLASE in the last 168 hours. No results for input(s): AMMONIA in the last 168 hours. CBC: Recent Labs  Lab 02/15/20 1510 02/16/20 1003  WBC 12.8* 13.3*  NEUTROABS 9.9*  --   HGB 13.0 12.5  HCT 44.2 41.9  MCV 91.7 90.3  PLT 461* 435*   Cardiac Enzymes: No results for input(s): CKTOTAL, CKMB, CKMBINDEX, TROPONINI in the last 168 hours. BNP: Invalid input(s): POCBNP CBG: No results for input(s): GLUCAP in the last 168 hours. D-Dimer No results for input(s): DDIMER in the last 72 hours. Hgb A1c No results for input(s): HGBA1C in the last 72 hours. Lipid Profile No results for input(s): CHOL, HDL, LDLCALC, TRIG, CHOLHDL, LDLDIRECT in the last 72 hours. Thyroid function studies Recent Labs    02/16/20 0745  TSH 6.616*   Anemia work up No results for input(s): VITAMINB12, FOLATE, FERRITIN, TIBC, IRON, RETICCTPCT in the last 72 hours. Urinalysis    Component Value Date/Time   COLORURINE YELLOW 02/15/2020 2147   APPEARANCEUR CLOUDY (A) 02/15/2020 2147   LABSPEC 1.013 02/15/2020 2147   PHURINE 5.0 02/15/2020 2147   GLUCOSEU NEGATIVE 02/15/2020 2147   HGBUR MODERATE (A) 02/15/2020 2147   BILIRUBINUR NEGATIVE 02/15/2020 2147   KETONESUR 5 (A) 02/15/2020 2147   PROTEINUR 100 (A) 02/15/2020 2147   UROBILINOGEN 0.2 07/22/2013 1141   NITRITE NEGATIVE 02/15/2020 2147   LEUKOCYTESUR LARGE (A) 02/15/2020 2147    Microbiology Recent Results (from the past 240 hour(s))  Urine culture     Status:  Abnormal   Collection Time: 02/15/20 11:14 PM   Specimen: Urine, Random  Result Value Ref Range Status   Specimen Description URINE, RANDOM  Final   Special Requests   Final    NONE Performed at [facility], [place] [address] [place], [place] [address]    Culture >=100,000 COLONIES/mL KLEBSIELLA PNEUMONIAE (A)  Final   Report Status 02/18/2020 FINAL  Final   Organism ID, Bacteria KLEBSIELLA PNEUMONIAE (A)  Final      Susceptibility   Klebsiella pneumoniae - MIC*    AMPICILLIN >=32 RESISTANT Resistant     CEFAZOLIN <=4 SENSITIVE Sensitive     CEFTRIAXONE <=1 SENSITIVE Sensitive     CIPROFLOXACIN <=0.25 SENSITIVE Sensitive     GENTAMICIN <=1 SENSITIVE Sensitive     IMIPENEM <=0.25 SENSITIVE Sensitive     NITROFURANTOIN 64 INTERMEDIATE Intermediate     TRIMETH/SULFA <=20 SENSITIVE Sensitive     AMPICILLIN/SULBACTAM 4 SENSITIVE Sensitive  PIP/TAZO <=4 SENSITIVE Sensitive     * >=100,000 COLONIES/mL KLEBSIELLA PNEUMONIAE  Respiratory Panel by RT PCR (Flu A&B, Covid) - Nasopharyngeal Swab     Status: None   Collection Time: 02/16/20  1:00 AM   Specimen: Nasopharyngeal Swab  Result Value Ref Range Status   SARS Coronavirus 2 by RT PCR NEGATIVE NEGATIVE Final    Comment: (NOTE) SARS-CoV-2 target nucleic acids are NOT DETECTED. The SARS-CoV-2 RNA is generally detectable in upper respiratoy specimens during the acute phase of infection. The lowest concentration of SARS-CoV-2 viral copies this assay can detect is 131 copies/mL. A negative result does not preclude SARS-Cov-2 infection and should not be used as the sole basis for treatment or other patient management decisions. A negative result may occur with  improper specimen collection/handling, submission of specimen other than nasopharyngeal swab, presence of viral mutation(s) within the areas targeted by this assay, and inadequate number of viral copies (<131 copies/mL). A negative result must be combined with  clinical observations, patient history, and epidemiological information. The expected result is Negative. Fact Sheet for Patients:  PinkCheek.be Fact Sheet for Healthcare Providers:  GravelBags.it This test is not yet ap proved or cleared by the [place] FDA and  has been authorized for detection and/or diagnosis of SARS-CoV-2 by FDA under an Emergency Use Authorization (EUA). This EUA will remain  in effect (meaning this test can be used) for the duration of the COVID-19 declaration under Section 564(b)(1) of the Act, 21 U.S.C. section 360bbb-3(b)(1), unless the authorization is terminated or revoked sooner.    Influenza A by PCR NEGATIVE NEGATIVE Final   Influenza B by PCR NEGATIVE NEGATIVE Final    Comment: (NOTE) The Xpert Xpress SARS-CoV-2/FLU/RSV assay is intended as an aid in  the diagnosis of influenza from Nasopharyngeal swab specimens and  should not be used as a sole basis for treatment. Nasal washings and  aspirates are unacceptable for Xpert Xpress SARS-CoV-2/FLU/RSV  testing. Fact Sheet for Patients: PinkCheek.be Fact Sheet for Healthcare Providers: GravelBags.it This test is not yet approved or cleared by the [place] FDA and  has been authorized for detection and/or diagnosis of SARS-CoV-2 by  FDA under an Emergency Use Authorization (EUA). This EUA will remain  in effect (meaning this test can be used) for the duration of the  Covid-19 declaration under Section 564(b)(1) of the Act, 21  U.S.C. section 360bbb-3(b)(1), unless the authorization is  terminated or revoked. Performed at [facility], [place] [address], [place], [place] [address]     Time coordinating discharge: Approximately 40 minutes  Patrecia Pour, MD  [facility] 02/18/2020, 12:30 PM

## 2020-02-19 DIAGNOSIS — R030 Elevated blood-pressure reading, without diagnosis of hypertension: Secondary | ICD-10-CM | POA: Diagnosis not present

## 2020-02-19 DIAGNOSIS — E039 Hypothyroidism, unspecified: Secondary | ICD-10-CM | POA: Diagnosis not present

## 2020-02-19 DIAGNOSIS — N3000 Acute cystitis without hematuria: Secondary | ICD-10-CM | POA: Diagnosis not present

## 2020-02-19 DIAGNOSIS — M1991 Primary osteoarthritis, unspecified site: Secondary | ICD-10-CM | POA: Diagnosis not present

## 2020-02-19 DIAGNOSIS — G8929 Other chronic pain: Secondary | ICD-10-CM | POA: Diagnosis not present

## 2020-02-19 DIAGNOSIS — M1A9XX0 Chronic gout, unspecified, without tophus (tophi): Secondary | ICD-10-CM | POA: Diagnosis not present

## 2020-02-19 DIAGNOSIS — R54 Age-related physical debility: Secondary | ICD-10-CM | POA: Diagnosis not present

## 2020-02-19 DIAGNOSIS — N184 Chronic kidney disease, stage 4 (severe): Secondary | ICD-10-CM | POA: Diagnosis not present

## 2020-02-19 DIAGNOSIS — K219 Gastro-esophageal reflux disease without esophagitis: Secondary | ICD-10-CM | POA: Diagnosis not present

## 2020-02-24 DIAGNOSIS — N184 Chronic kidney disease, stage 4 (severe): Secondary | ICD-10-CM | POA: Diagnosis not present

## 2020-02-24 DIAGNOSIS — E039 Hypothyroidism, unspecified: Secondary | ICD-10-CM | POA: Diagnosis not present

## 2020-02-24 DIAGNOSIS — R54 Age-related physical debility: Secondary | ICD-10-CM | POA: Diagnosis not present

## 2020-02-24 DIAGNOSIS — K219 Gastro-esophageal reflux disease without esophagitis: Secondary | ICD-10-CM | POA: Diagnosis not present

## 2020-02-24 DIAGNOSIS — G8929 Other chronic pain: Secondary | ICD-10-CM | POA: Diagnosis not present

## 2020-02-26 ENCOUNTER — Other Ambulatory Visit: Payer: Self-pay | Admitting: *Deleted

## 2020-02-26 NOTE — Patient Outreach (Addendum)
Screened for potential [facility] Care Management needs as a benefit of  [employer] Medicare.  Mrs. Carolyn Robertson is currently receiving skilled therapy at [facility] SNF.   Writer attended telephonic interdisciplinary team meeting to assess for disposition needs and transition plan for resident.   Mrs. Carolyn Robertson is from home with husband. Facility reports transition plan is to return home. Reports member is participating with therapy. Facility states, per hospital records, member was essentially in bed most of the time prior to admission. Unsure why. Discussed palliative follow up was suggested on hospital dc summary. Facility SW indicates palliative referral has been made.   Will plan outreach to husband to discuss transition plans. Will continue to follow while residing in SNF.    Marthenia Rolling, MSN-Ed, RN,BSN [place] Acute Care Coordinator [phone] [facility]) [phone]  (Toll free office)

## 2020-03-02 NOTE — Progress Notes (Signed)
May 18th, 2021 [facility] Palliative Care Consult Note Telephone: [phone]  Fax: [phone]  PATIENT NAME: Carolyn Robertson DOB: [birth year] MRN: [id number] [place] [address] (admit 02/18/2020)  (Home) [address], [place]  PRIMARY CARE PROVIDER:   Burnard Bunting, MD  REFERRING PROVIDER:  Burnard Bunting, MD [address] [place],  [place] [address]  Dr. Clover Mealy (Nephrology [place]). [place] (Urology) every few months Rheumatologist via [place]: (spouse)  Carolyn Robertson [phone]. (M) [phone]. (dtr) Carolyn Robertson [phone]. Carolyn Robertson [phone]  ASSESSMENT / RECOMMENDATIONS:  1. Advance Care Planning: A. Directives: MOST in facility chart; I uploaded into [place]. DNR/DNI. Full Scope of Medical Interventions. Yes to Antibiotics and IVFs. Yes to tube feeding by default.   B. Goals of Care: To return home with husband.  2. Symptom Management:  3. Cognitive / Functional status: Patient is A&O x 3; alert and pleasantly conversant. She feels better over these last few days. Mentions mild memory problems.  She has been working with facility physical therapy; ambulating in the hall with stand by assist. Able sit up from laying position. Episodically needs assist to transfer. She has a transport chair at home but would prefer a regular wheelchair so can use the larger wheels to propel herself about. Her activity is limited by LE neuropathy and R>L knee weakness/osteoarthritis. Prior to recent hospital admission her husband was assisting her in transferring to toilet and lost his grip. Patient had increased weakness/confusion at that time d/t UTI.  -patient is motivated to continue home PT.  Followed by Urology with chronic antibiotics d/t h/o frequent UTI's. She is incontinent of urine. She was started by urologist on a medication of overactive bladder (less than a week) when she was  admitted to the hospital, so she's not sure if it was working for her or not.  -consider cranberry tabs 500mg  bid   She is followed by Dr Noemi Chapel for her knee pain; told was not a surgical candidate for knee replacement ("I would wake up dead!"). Cortisone shots to that R knee in the past with transient relief. Recently started low dose opioid by Dr. Joya Salm. Takes 0-4 tabs/day prn pain.  -we discussed the benefits vs burdens of opioid use for long term pain management. Recommended adding Tylenol to regimen. Patient is concerned about possible liver damage. Reassured her dose of less than 3 grams total / day is safe.   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 Can be very intense. Worse when in shoulders. Last hs couldn't sleep d/t hands hurting. Some days sleeps all day long d/t exhaustion from pain.  Feels not coping very well. Gave up on her Physical Therapy; believes this is why she got so weak. Fell 3 days in a row just before hospitalization. She suffered a concussion last summer after a fall. She had subsequent to back surgery (plate and 4 screws). Only residual discomfort if she sits a long time d/t pressure on tail bone. Previous surgery summer of 2019 for herniated disk; no real improvement. Followed by Rheumatologist at [facility]; was considering starting a new medication treatment.   4. Family Supports: Patient lives at home with her husband of greater than 50 years, and their little dog. Patient's daughter Carolyn Robertson plans to travel up from [place] to visit, after patient home from rehab. Patient and her husband are estranged from their son Carolyn Robertson, who lives  in [place]. Called and left message on home number with my return contact information. Attempted to touch base with daughter Carolyn Robertson; mail box full so unable to leave message.   5. Follow up Palliative Care Visit: After discharge. I gave patient my contact information to call me  to set up a home f/u visit.  I spent 60 minutes providing this consultation from 9:30am to 10:30am. More than 50% of the time in this consultation was spent coordinating communication.   HISTORY OF PRESENT ILLNESS:  Carolyn Robertson is a 74 y.o. female with history of stroke, stage 4 CKD, hypothyroidism, cystitis. Osteoarthritis (prednisone/cymbalta/hydrocodone), GERD, gout.  5/1-02/18/2020 hospitalized with generalized weakness/debility, confusion 2/2 vascular dementia/polypharmacy/UTI (head CT without acute changes).  Palliative Care was asked to help address goals of care.   CODE STATUS: DNR  PPS: 50% HOSPICE ELIGIBILITY/DIAGNOSIS: TBD PAST MEDICAL HISTORY:  Past Medical History:  Diagnosis Date  . Anxiety   . Arthritis    RA  . 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 Depression   . GERD (gastroesophageal reflux disease)   . History of blood transfusion    after miscarrige  . History of kidney stones    x 2  . Hypertension   . Hypothyroidism   . Incontinent of urine    incontinent at night  . Kidney failure   . Memory loss    "after TIA"  . Pneumonia    as a child and teenager  . Renal disorder    stage IV kidney disease  . Stroke ([place])   . Thyroid disease   . TIA (transient ischemic attack) 06/2018    SOCIAL HX:  Social History   Tobacco Use  . Smoking status: Never Smoker  . Smokeless tobacco: Never Used  Substance Use Topics  . Alcohol use: No    Comment: rarely    ALLERGIES:  Allergies  Allergen Reactions  . Other Swelling    A bandage/gauze that was used on lower extremity wound at [facility], unsure type- Pt states her foot turned red and swelled up  . Penicillins Swelling and Rash    Has patient had a PCN reaction causing immediate rash, facial/tongue/throat swelling, SOB or lightheadedness with hypotension: Unknown Has patient had a PCN reaction causing severe rash involving mucus membranes or skin necrosis: Unknown Has  patient had a PCN reaction that required hospitalization: No Has patient had a PCN reaction occurring within the last 10 years: No If all of the above answers are "NO", then may proceed with Cephalosporin use.   . Sulfa Antibiotics Itching and Swelling  . Codeine Nausea Only  . Hydrocodone-Acetaminophen Other (See Comments)    Causes hallucinations  . Naproxen Sodium Swelling and Other (See Comments)    (ALEVE) Red Man Syndrome  . Tape Rash and Other (See Comments)    BURNS SKIN --PAPER TAPE IS OKAY     PERTINENT MEDICATIONS:  Outpatient Encounter Medications as of 03/03/2020  Medication Sig  . aspirin 81 MG EC tablet Take 1 tablet (81 mg total) by mouth daily.  . baclofen (LIORESAL) 10 MG tablet Take 10 mg by mouth 2 (two) times daily as needed for muscle spasms.  . cyanocobalamin (,VITAMIN B-12,) 1000 MCG/ML injection Inject 1 mL into the skin every 28 (twenty-eight) days.  . DULoxetine (CYMBALTA) 60 MG capsule Take 60 mg by mouth at bedtime.  Marland Kitchen estrogen, conjugated,-medroxyprogesterone (PREMPRO) 0.625-2.5 MG per tablet Take 1 tablet by mouth daily.  Marland Kitchen  Febuxostat 80 MG TABS Take 40 mg by mouth at bedtime.   . furosemide (LASIX) 40 MG tablet Take 40 mg by mouth daily as needed for edema.   Marland Kitchen HYDROcodone-acetaminophen (NORCO/VICODIN) 5-325 MG tablet Take 1 tablet by mouth every 6 (six) hours as needed.  Marland Kitchen levothyroxine (SYNTHROID) 200 MCG tablet Take 200 mcg by mouth See admin instructions. Take 217mcg by mouth on Monday, Wednesday and Friday  . nystatin cream (MYCOSTATIN) Apply to affected area 2 times daily  . pantoprazole (PROTONIX) 40 MG tablet Take 40 mg by mouth daily before breakfast.  . SYNTHROID 175 MCG tablet Take 175 mcg by mouth daily before breakfast. Take 169mcg by mouth on Tuesdays, Thursdays, Saturdays and Sundays  . trimethoprim (TRIMPEX) 100 MG tablet Take 100 mg by mouth daily.   No facility-administered encounter medications on file as of 03/03/2020.    PHYSICAL  EXAM:   General: NAD, frail appearing, thin. Pleasantly conversant and on topic Cardiovascular: regular rate and rhythm, systolic murmur loudest LLSB Pulmonary: clear ant fields Abdomen: soft, nontender, + bowel sounds Extremities: no edema Skin: no rashes Neurological: Weakness but otherwise non-focal  Julianne Handler, NP

## 2020-03-03 ENCOUNTER — Encounter: Payer: Self-pay | Admitting: Internal Medicine

## 2020-03-03 ENCOUNTER — Non-Acute Institutional Stay: Payer: Medicare Other | Admitting: Internal Medicine

## 2020-03-03 ENCOUNTER — Other Ambulatory Visit: Payer: Self-pay

## 2020-03-03 ENCOUNTER — Non-Acute Institutional Stay: Payer: Medicare Other

## 2020-03-03 DIAGNOSIS — Z7189 Other specified counseling: Secondary | ICD-10-CM

## 2020-03-03 DIAGNOSIS — Z515 Encounter for palliative care: Secondary | ICD-10-CM

## 2020-03-04 ENCOUNTER — Other Ambulatory Visit: Payer: Self-pay

## 2020-03-05 NOTE — Progress Notes (Signed)
COMMUNITY PALLIATIVE CARE SW NOTE  PATIENT NAME: Carolyn Robertson DOB: 01/30/1946 MRN: [id number]  PRIMARY CARE PROVIDER: Burnard Bunting, MD  RESPONSIBLE PARTY:  Acct ID - Guarantor Home Phone Work Phone Relationship Acct Type  [id number] [name]  Self P/F     [address] [place], [place] [address]     PLAN OF CARE and INTERVENTIONS:             1. GOALS OF CARE/ ADVANCE CARE PLANNING: Goal is for patient to return home following rehabilitation. Patient is FULL CODE. 2. SOCIAL/EMOTIONAL/SPIRITUAL ASSESSMENT/ INTERVENTIONS:  SW completed a face-to-face visit with patient at the facility [facility] SNF). Patient was in bed. SW introduced herself and palliative care program to her. She was alert and oriented x3. She denied pain, but reported that she was tired. Patient reported that she has arthritis in all joints that causes her to have intermittent pain. She was hospitalized after having three falls in three days and had infection prior to coming to the SNF for rehab. Patient stated that she has been working with physical therapy daily and is up walking with a wheelchair. She reported that her appetite is better. Overall she feels that she is progressing in rehab but worries about falling again. Patient reported that she was vaccinated in January. She is from [place], [place]. She graduated [employer] and worked in a [employer]il [employer]. She has been married to her husband-Hubert for 44  Years and they have two children. Patient is up [place]. SW provided introduction and education to palliative care services/team, supportive presence, active listening, and assessment of needs and comfort. 3. PATIENT/CAREGIVER EDUCATION/ COPING:  Patient appears to be coping well. She shared that she is worried about falling again, but is hoping that she will regain strength through physical therapy. She reports that her husband and children are very supportive. 4. PERSONAL EMERGENCY PLAN:  Per facility protocol.  5. COMMUNITY RESOURCES COORDINATION/ HEALTH CARE NAVIGATION:  Patient is receiving physical therapy. 6. FINANCIAL/LEGAL CONCERNS/INTERVENTIONS:  No financial or legal concerns.     SOCIAL HX:  Social History   Tobacco Use  . Smoking status: Never Smoker  . Smokeless tobacco: Never Used  Substance Use Topics  . Alcohol use: No    Comment: rarely    CODE STATUS:   Code Status: Prior FULL CODE ADVANCED DIRECTIVES: No MOST FORM COMPLETE: No HOSPICE EDUCATION PROVIDED: No  PPS: Patient ambulates with a wheelchair or walker. She is independent of ADL's but requires standby assistance as a safety precaution. She is alert and oriented x3.   Duration of visit and documentation 60 minutes.       [address] [place], [place]

## 2020-03-09 DIAGNOSIS — R54 Age-related physical debility: Secondary | ICD-10-CM | POA: Diagnosis not present

## 2020-03-09 DIAGNOSIS — E039 Hypothyroidism, unspecified: Secondary | ICD-10-CM | POA: Diagnosis not present

## 2020-03-09 DIAGNOSIS — N184 Chronic kidney disease, stage 4 (severe): Secondary | ICD-10-CM | POA: Diagnosis not present

## 2020-03-09 DIAGNOSIS — G8929 Other chronic pain: Secondary | ICD-10-CM | POA: Diagnosis not present

## 2020-03-09 DIAGNOSIS — M1991 Primary osteoarthritis, unspecified site: Secondary | ICD-10-CM | POA: Diagnosis not present

## 2020-03-10 ENCOUNTER — Other Ambulatory Visit: Payer: Self-pay | Admitting: *Deleted

## 2020-03-10 DIAGNOSIS — I10 Essential (primary) hypertension: Secondary | ICD-10-CM

## 2020-03-10 NOTE — Patient Outreach (Signed)
THN Post- Acute Care Coordinator follow up  Carolyn Robertson transitioned from [facility] today 03/10/20.   Telephone call made to speak with member [phone]. Patient identifiers confirmed. States she is doing okay. States her husband is not able to help as much and her daughter lives in [place].  Discussed [facility] Care Management services. Carolyn Robertson is agreeable to [place] for care coordination and [facility] LCSW for meals and transportation. States she plans on contacting her PCP to make follow up appointment. Confirms PCP is Dr. Reynaldo Minium. Confirmed best contact number for Carolyn Robertson is [phone].   Also discussed [facility] program for home visits. Carolyn Robertson is agreeable.  Confirmed with facility SW that [facility] was arranged. Tub transfer bench and wheelchair was ordered by facility SW as well.   Member was also followed by [facility] palliative while in SNF. Sent notification to [facility] palliative to make aware member is now home.  Will make [facility] Care Management referrals for RNCM and [facility]N LCSW. Will make [facility] referral. Member is high risk for readmission.   History of debility, deconditioning, CVA, asthma, GERD, depression, HTN, TIA, stage IV kidney disease.  Marthenia Rolling, MSN-Ed, RN,BSN [place] Acute Care Coordinator [phone] [facility]) [phone]  (Toll free office)

## 2020-03-12 ENCOUNTER — Other Ambulatory Visit: Payer: Self-pay | Admitting: Registered Nurse

## 2020-03-12 ENCOUNTER — Other Ambulatory Visit: Payer: Self-pay | Admitting: Internal Medicine

## 2020-03-12 ENCOUNTER — Ambulatory Visit
Admission: RE | Admit: 2020-03-12 | Discharge: 2020-03-12 | Disposition: A | Discharge status: Home / Self Care | Payer: Medicare Other | Source: Ambulatory Visit | Attending: Registered Nurse | Admitting: Registered Nurse

## 2020-03-12 DIAGNOSIS — I872 Venous insufficiency (chronic) (peripheral): Secondary | ICD-10-CM | POA: Diagnosis not present

## 2020-03-12 DIAGNOSIS — M1A30X0 Chronic gout due to renal impairment, unspecified site, without tophus (tophi): Secondary | ICD-10-CM | POA: Diagnosis not present

## 2020-03-12 DIAGNOSIS — R627 Adult failure to thrive: Secondary | ICD-10-CM | POA: Diagnosis not present

## 2020-03-12 DIAGNOSIS — M810 Age-related osteoporosis without current pathological fracture: Secondary | ICD-10-CM | POA: Diagnosis not present

## 2020-03-12 DIAGNOSIS — Z9181 History of falling: Secondary | ICD-10-CM | POA: Diagnosis not present

## 2020-03-12 DIAGNOSIS — R109 Unspecified abdominal pain: Secondary | ICD-10-CM

## 2020-03-12 DIAGNOSIS — G8929 Other chronic pain: Secondary | ICD-10-CM | POA: Diagnosis not present

## 2020-03-12 DIAGNOSIS — R531 Weakness: Secondary | ICD-10-CM | POA: Diagnosis not present

## 2020-03-12 DIAGNOSIS — Z7982 Long term (current) use of aspirin: Secondary | ICD-10-CM | POA: Diagnosis not present

## 2020-03-12 DIAGNOSIS — R634 Abnormal weight loss: Secondary | ICD-10-CM

## 2020-03-12 DIAGNOSIS — M4316 Spondylolisthesis, lumbar region: Secondary | ICD-10-CM | POA: Diagnosis not present

## 2020-03-12 DIAGNOSIS — M6259 Muscle wasting and atrophy, not elsewhere classified, multiple sites: Secondary | ICD-10-CM | POA: Diagnosis not present

## 2020-03-12 DIAGNOSIS — M150 Primary generalized (osteo)arthritis: Secondary | ICD-10-CM | POA: Diagnosis not present

## 2020-03-12 DIAGNOSIS — R61 Generalized hyperhidrosis: Secondary | ICD-10-CM

## 2020-03-12 DIAGNOSIS — Z79891 Long term (current) use of opiate analgesic: Secondary | ICD-10-CM | POA: Diagnosis not present

## 2020-03-12 DIAGNOSIS — N3090 Cystitis, unspecified without hematuria: Secondary | ICD-10-CM | POA: Diagnosis not present

## 2020-03-12 DIAGNOSIS — E039 Hypothyroidism, unspecified: Secondary | ICD-10-CM | POA: Diagnosis not present

## 2020-03-12 DIAGNOSIS — R1312 Dysphagia, oropharyngeal phase: Secondary | ICD-10-CM | POA: Diagnosis not present

## 2020-03-12 DIAGNOSIS — K219 Gastro-esophageal reflux disease without esophagitis: Secondary | ICD-10-CM | POA: Diagnosis not present

## 2020-03-12 DIAGNOSIS — Z792 Long term (current) use of antibiotics: Secondary | ICD-10-CM | POA: Diagnosis not present

## 2020-03-12 DIAGNOSIS — G894 Chronic pain syndrome: Secondary | ICD-10-CM | POA: Diagnosis not present

## 2020-03-12 DIAGNOSIS — N3000 Acute cystitis without hematuria: Secondary | ICD-10-CM | POA: Diagnosis not present

## 2020-03-12 DIAGNOSIS — N184 Chronic kidney disease, stage 4 (severe): Secondary | ICD-10-CM | POA: Diagnosis not present

## 2020-03-12 DIAGNOSIS — R1084 Generalized abdominal pain: Secondary | ICD-10-CM | POA: Diagnosis not present

## 2020-03-12 DIAGNOSIS — Z981 Arthrodesis status: Secondary | ICD-10-CM | POA: Diagnosis not present

## 2020-03-13 ENCOUNTER — Other Ambulatory Visit: Payer: Self-pay | Admitting: *Deleted

## 2020-03-13 NOTE — Patient Outreach (Signed)
[place] [facility]) Care Management  03/13/2020  [name] May 20, 1946 [id number]   CSW made initial contact with pt by phone and confirmed her identity. CSW introduced self, role and reason for call. Pt confirmed she was released from [facility] SNF and is now back home. She has been able to get her RX and lives with her husband.  Per pt, she is seeing her PCP 5/27 and is awaiting Home Health visits to begin. Pt prefers CSW follow up next week to further assess for needs. Pt reports she has transportation, food and the means to prepare it at this time.   CSW will plan to call pt again next week.   Eduard Clos, MSW, [place] Worker  [place] [phone]

## 2020-03-13 NOTE — Patient Outreach (Signed)
[facility]) Care Management  03/13/2020  Carolyn Robertson 15-Feb-1946 [id number]   Received referral 5/25 post SNF discharge  Transition of care/Telephone Assessment   RN attempted the initial outreach today however unsuccessful. RN unable to leave a message.  Plan: Will plan another outreach call over the next week for pending services.  [name], RN Care Management Coordinator [place] Office [phone]

## 2020-03-17 ENCOUNTER — Other Ambulatory Visit: Payer: Self-pay | Admitting: *Deleted

## 2020-03-17 NOTE — Patient Outreach (Signed)
[place] [facility]) Care Management  03/17/2020  SCHAE CANDO 1946-01-24 [id number]   CSW spoke with pt this morning who reports she had diarrhea over the weekend and canceled her [facility] visit.  "The Nurse is supposed to come today".  Pt feels the diarrhea may have been because she was "dehydrated".  CSW encouraged pt to drink water to help with dehydration from the diarrhea alone.  Her daughter has come to visit and help from [place] and per pt, "she is here to whip [place] in shape".  Pt is drinking some Ensure ("no appetite") and is encouraged to drink these daily to aide her in nourishment/energy.   Pt is also anticipating HHPT visit this week- her husband is able to assist her at home and she is hoping she will continue to progress.  Her daughter is also looking into some hired assistance for them as their housekeeper has been out sick with [place]. Per pt, they have contact info for private duty options.  Pt declines any current needs for meals or transportation but is reminded to call if the need does arise.  CSW will update [facility] RNCM and plan to touch base with pt again next week.   Eduard Clos, MSW, [place] Worker  [place] [phone]

## 2020-03-18 ENCOUNTER — Other Ambulatory Visit: Payer: Self-pay | Admitting: *Deleted

## 2020-03-18 DIAGNOSIS — M1A30X0 Chronic gout due to renal impairment, unspecified site, without tophus (tophi): Secondary | ICD-10-CM | POA: Diagnosis not present

## 2020-03-18 DIAGNOSIS — M4316 Spondylolisthesis, lumbar region: Secondary | ICD-10-CM | POA: Diagnosis not present

## 2020-03-18 DIAGNOSIS — R1084 Generalized abdominal pain: Secondary | ICD-10-CM | POA: Diagnosis not present

## 2020-03-18 DIAGNOSIS — E039 Hypothyroidism, unspecified: Secondary | ICD-10-CM | POA: Diagnosis not present

## 2020-03-18 DIAGNOSIS — G894 Chronic pain syndrome: Secondary | ICD-10-CM | POA: Diagnosis not present

## 2020-03-18 DIAGNOSIS — M150 Primary generalized (osteo)arthritis: Secondary | ICD-10-CM | POA: Diagnosis not present

## 2020-03-18 DIAGNOSIS — N3000 Acute cystitis without hematuria: Secondary | ICD-10-CM | POA: Diagnosis not present

## 2020-03-18 DIAGNOSIS — N184 Chronic kidney disease, stage 4 (severe): Secondary | ICD-10-CM | POA: Diagnosis not present

## 2020-03-18 NOTE — Patient Outreach (Signed)
[facility]) Care Management  03/18/2020  CALISA LUCKENBAUGH 05-Jan-1946 [id number]   Telephone Assessment-Successful-Enrolled   RN spoke with pt and discussed her recent discharged from the SNF. Pt receptive and currently awaiting therapy session with home health PT. RN explained the purpose for today's call and introduce [facility] services. Pt remains receptive as Therapist, sports further inquired on time available for further discussion. Pt receptive to further discussion at this time. RN discuss benefits of the [facility] program and services and discussed possible program for ongoing monitoring. Inquired on pt's HTN however pt states she is more interested in risk for falls due to her recent SNF and hospitalization. RN offered to enroll pt into a fall prevention program for ongoing safety measures (agreed). Also offered to end printable emmi for safety in the home. Will also discuss pt's HTN which pt states is controlled with no needs at this time. RN generated and discussed goals and interventions to assist pt with managing her risk for falls. Encouraged pt to use her DME and work with the therapist on fall prevention, strengthening and build endurance with her weekly exercises.  Offered to follow up weekly for transition of care or monthly based upon her schedule. RN requested a time and day to call back to obtain the initial assessment. Pt has requested  Call back next week.  Plan: Will follow up next week for the initial assessment, notify pt's provider of pt's disposition with [facility] services and send all Welcome and Successful Letter to pt for enrolling into the [facility] program and services. Note below in the plan of care generated for pt's management of care related to fall prevention.  THN CM Care Plan Problem One     Most Recent Value  Care Plan Problem One  Impaired safety-falls related to limited mobility and weakness  Role Documenting the Problem One  Care Management [place]  for Problem One  Active  THN Long Term Goal   Pt will not have a fall within the next 90 days.  THN Long Term Goal Start Date  03/18/20  Interventions for Problem One Long Term Goal  Will discuss safety inside andoutside the home with use of DME and safety precautions with her limited mobility. Will send fall prevention educational material (EMMI) for pt to review for fall prevention.  THN CM Short Term Goal #1   Pt will work with HHPT for strenghten and endurance exercises over the next 30 days.  THN CM Short Term Goal #1 Start Date  03/18/20  Interventions for Short Term Goal #1  Will strongly encouraged pt ot work with the PT personnel with all that is recommended to increase her strenghten while ambulating. Will encouraged use of her DME as recommended.  THN CM Short Term Goal #2   Pt will implement strategies to increase safety and prevent falls in the home within the next 30 days.  THN CM Short Term Goal #2 Start Date  03/18/20  Interventions for Short Term Goal #2  Will discuss night lights throughout the home for a better visual at night and remove all rugs throughout the home to lower the risk for falls. Will also send education information packet to assist with other safety measurs in the home.      [name], RN Care Management Coordinator [place] Office [phone]

## 2020-03-19 DIAGNOSIS — N390 Urinary tract infection, site not specified: Secondary | ICD-10-CM | POA: Diagnosis not present

## 2020-03-19 DIAGNOSIS — N184 Chronic kidney disease, stage 4 (severe): Secondary | ICD-10-CM | POA: Diagnosis not present

## 2020-03-23 ENCOUNTER — Other Ambulatory Visit: Payer: Medicare Other | Admitting: *Deleted

## 2020-03-23 DIAGNOSIS — G894 Chronic pain syndrome: Secondary | ICD-10-CM | POA: Diagnosis not present

## 2020-03-23 DIAGNOSIS — N184 Chronic kidney disease, stage 4 (severe): Secondary | ICD-10-CM | POA: Diagnosis not present

## 2020-03-23 DIAGNOSIS — M150 Primary generalized (osteo)arthritis: Secondary | ICD-10-CM | POA: Diagnosis not present

## 2020-03-23 DIAGNOSIS — M4316 Spondylolisthesis, lumbar region: Secondary | ICD-10-CM | POA: Diagnosis not present

## 2020-03-23 DIAGNOSIS — M1A30X0 Chronic gout due to renal impairment, unspecified site, without tophus (tophi): Secondary | ICD-10-CM | POA: Diagnosis not present

## 2020-03-23 DIAGNOSIS — N3000 Acute cystitis without hematuria: Secondary | ICD-10-CM | POA: Diagnosis not present

## 2020-03-23 NOTE — Patient Outreach (Signed)
[place] [facility]) Care Management  03/23/2020  TERSEA AULDS 1946-02-25 [id number]  Telephone Assessment-Unsuccessful  RN attempted outreach call today to completed the initial assessment however unsuccessful. RN able to leave a HIPAA approved voice message requesting a call back.  Plan: Will completed another outreach call next week to obtain this information.  [name], RN Care Management Coordinator [place] Office [phone]

## 2020-03-24 ENCOUNTER — Ambulatory Visit: Payer: Self-pay | Admitting: *Deleted

## 2020-03-24 ENCOUNTER — Other Ambulatory Visit: Payer: Self-pay | Admitting: *Deleted

## 2020-03-24 DIAGNOSIS — N184 Chronic kidney disease, stage 4 (severe): Secondary | ICD-10-CM | POA: Diagnosis not present

## 2020-03-24 DIAGNOSIS — M150 Primary generalized (osteo)arthritis: Secondary | ICD-10-CM | POA: Diagnosis not present

## 2020-03-24 DIAGNOSIS — M1A30X0 Chronic gout due to renal impairment, unspecified site, without tophus (tophi): Secondary | ICD-10-CM | POA: Diagnosis not present

## 2020-03-24 DIAGNOSIS — M4316 Spondylolisthesis, lumbar region: Secondary | ICD-10-CM | POA: Diagnosis not present

## 2020-03-24 DIAGNOSIS — N3000 Acute cystitis without hematuria: Secondary | ICD-10-CM | POA: Diagnosis not present

## 2020-03-24 DIAGNOSIS — G894 Chronic pain syndrome: Secondary | ICD-10-CM | POA: Diagnosis not present

## 2020-03-24 NOTE — Patient Outreach (Signed)
[place] [facility]) Care Management  03/24/2020  Carolyn Robertson 01-30-1946 [id number]   CSW attempted follow up outreach call to pt and was unsuccessful.  CSW was able to leave message on home # and cell # had full voicemail.  CSW will attempt outreach again in 3-4 business days if no return call is received.   Eduard Clos, MSW, [place] Worker  [place] [phone]

## 2020-03-25 DIAGNOSIS — N3000 Acute cystitis without hematuria: Secondary | ICD-10-CM | POA: Diagnosis not present

## 2020-03-25 DIAGNOSIS — M4316 Spondylolisthesis, lumbar region: Secondary | ICD-10-CM | POA: Diagnosis not present

## 2020-03-25 DIAGNOSIS — M150 Primary generalized (osteo)arthritis: Secondary | ICD-10-CM | POA: Diagnosis not present

## 2020-03-25 DIAGNOSIS — M1A30X0 Chronic gout due to renal impairment, unspecified site, without tophus (tophi): Secondary | ICD-10-CM | POA: Diagnosis not present

## 2020-03-25 DIAGNOSIS — G894 Chronic pain syndrome: Secondary | ICD-10-CM | POA: Diagnosis not present

## 2020-03-25 DIAGNOSIS — N184 Chronic kidney disease, stage 4 (severe): Secondary | ICD-10-CM | POA: Diagnosis not present

## 2020-03-26 DIAGNOSIS — M150 Primary generalized (osteo)arthritis: Secondary | ICD-10-CM | POA: Diagnosis not present

## 2020-03-26 DIAGNOSIS — N184 Chronic kidney disease, stage 4 (severe): Secondary | ICD-10-CM | POA: Diagnosis not present

## 2020-03-26 DIAGNOSIS — M4316 Spondylolisthesis, lumbar region: Secondary | ICD-10-CM | POA: Diagnosis not present

## 2020-03-26 DIAGNOSIS — N3000 Acute cystitis without hematuria: Secondary | ICD-10-CM | POA: Diagnosis not present

## 2020-03-26 DIAGNOSIS — M1A30X0 Chronic gout due to renal impairment, unspecified site, without tophus (tophi): Secondary | ICD-10-CM | POA: Diagnosis not present

## 2020-03-26 DIAGNOSIS — G894 Chronic pain syndrome: Secondary | ICD-10-CM | POA: Diagnosis not present

## 2020-03-27 ENCOUNTER — Other Ambulatory Visit: Payer: Self-pay | Admitting: *Deleted

## 2020-03-27 DIAGNOSIS — M150 Primary generalized (osteo)arthritis: Secondary | ICD-10-CM | POA: Diagnosis not present

## 2020-03-27 DIAGNOSIS — N184 Chronic kidney disease, stage 4 (severe): Secondary | ICD-10-CM | POA: Diagnosis not present

## 2020-03-27 DIAGNOSIS — N3000 Acute cystitis without hematuria: Secondary | ICD-10-CM | POA: Diagnosis not present

## 2020-03-27 DIAGNOSIS — M4316 Spondylolisthesis, lumbar region: Secondary | ICD-10-CM | POA: Diagnosis not present

## 2020-03-27 DIAGNOSIS — G894 Chronic pain syndrome: Secondary | ICD-10-CM | POA: Diagnosis not present

## 2020-03-27 DIAGNOSIS — M1A30X0 Chronic gout due to renal impairment, unspecified site, without tophus (tophi): Secondary | ICD-10-CM | POA: Diagnosis not present

## 2020-03-27 NOTE — Patient Outreach (Signed)
[place] [facility]) Care Management  03/27/2020  Carolyn Robertson 1946/02/23 [id number]   CSW made contact with pt today who reports things are going ok "so far".  Per pt, she continues to receive home health services and her housekeeper has returned and been able to help more.  Pt also states no transportation, medication or meal concerns.   CSW advised pt of plans to sign off. CSW provided pt with contact # for outreach if needs arise that CSW can be assist her with.   CSW will sign off and advise PCP and [facility] team of above.   Eduard Clos, MSW, [place] Worker  [place] [phone]

## 2020-03-30 ENCOUNTER — Other Ambulatory Visit: Payer: Self-pay | Admitting: *Deleted

## 2020-03-30 ENCOUNTER — Encounter: Payer: Self-pay | Admitting: *Deleted

## 2020-03-30 DIAGNOSIS — N3000 Acute cystitis without hematuria: Secondary | ICD-10-CM | POA: Diagnosis not present

## 2020-03-30 DIAGNOSIS — N184 Chronic kidney disease, stage 4 (severe): Secondary | ICD-10-CM | POA: Diagnosis not present

## 2020-03-30 DIAGNOSIS — M1A30X0 Chronic gout due to renal impairment, unspecified site, without tophus (tophi): Secondary | ICD-10-CM | POA: Diagnosis not present

## 2020-03-30 DIAGNOSIS — M4316 Spondylolisthesis, lumbar region: Secondary | ICD-10-CM | POA: Diagnosis not present

## 2020-03-30 DIAGNOSIS — G894 Chronic pain syndrome: Secondary | ICD-10-CM | POA: Diagnosis not present

## 2020-03-30 DIAGNOSIS — M150 Primary generalized (osteo)arthritis: Secondary | ICD-10-CM | POA: Diagnosis not present

## 2020-03-30 NOTE — Patient Outreach (Signed)
[place] [facility]) Care Management  03/30/2020  ZAKERIA KULZER 1946-03-20 [id number]  Telephone Assessment- RE: call back  RN attempted outreach call today however pt requested a call back later today. RN will return another outreach call later today to obtain the initial assessment information for enrollment into the [facility] program and services.  Plan: Will follow up with another call later today.  [name], RN Care Management Coordinator [place] Office [phone]

## 2020-03-30 NOTE — Patient Outreach (Signed)
Carolyn Robertson) Care Management  03/30/2020  [name] [birth year] [id number]   Initial Assessment (Completed)  RN spoke with pt today and completed the initial assessment. Pt aware of the plan of care and ongoing communication with her provider Dr. Reynaldo Robertson. Note established plan of care, goals and interventions completed 2 weeks ago and pt current pending received of the [facility] packet and educational emmi. Will further engage on the next follow up call next month. No inquires or request at this time.   Carolyn Mina, RN Care Management Coordinator [place] Office [phone]

## 2020-04-01 DIAGNOSIS — G894 Chronic pain syndrome: Secondary | ICD-10-CM | POA: Diagnosis not present

## 2020-04-01 DIAGNOSIS — M150 Primary generalized (osteo)arthritis: Secondary | ICD-10-CM | POA: Diagnosis not present

## 2020-04-01 DIAGNOSIS — M4316 Spondylolisthesis, lumbar region: Secondary | ICD-10-CM | POA: Diagnosis not present

## 2020-04-01 DIAGNOSIS — N184 Chronic kidney disease, stage 4 (severe): Secondary | ICD-10-CM | POA: Diagnosis not present

## 2020-04-01 DIAGNOSIS — N3000 Acute cystitis without hematuria: Secondary | ICD-10-CM | POA: Diagnosis not present

## 2020-04-01 DIAGNOSIS — M1A30X0 Chronic gout due to renal impairment, unspecified site, without tophus (tophi): Secondary | ICD-10-CM | POA: Diagnosis not present

## 2020-04-02 DIAGNOSIS — M150 Primary generalized (osteo)arthritis: Secondary | ICD-10-CM | POA: Diagnosis not present

## 2020-04-02 DIAGNOSIS — N3000 Acute cystitis without hematuria: Secondary | ICD-10-CM | POA: Diagnosis not present

## 2020-04-02 DIAGNOSIS — N184 Chronic kidney disease, stage 4 (severe): Secondary | ICD-10-CM | POA: Diagnosis not present

## 2020-04-02 DIAGNOSIS — M4316 Spondylolisthesis, lumbar region: Secondary | ICD-10-CM | POA: Diagnosis not present

## 2020-04-02 DIAGNOSIS — G894 Chronic pain syndrome: Secondary | ICD-10-CM | POA: Diagnosis not present

## 2020-04-02 DIAGNOSIS — M1A30X0 Chronic gout due to renal impairment, unspecified site, without tophus (tophi): Secondary | ICD-10-CM | POA: Diagnosis not present

## 2020-04-06 DIAGNOSIS — M1A30X0 Chronic gout due to renal impairment, unspecified site, without tophus (tophi): Secondary | ICD-10-CM | POA: Diagnosis not present

## 2020-04-06 DIAGNOSIS — M150 Primary generalized (osteo)arthritis: Secondary | ICD-10-CM | POA: Diagnosis not present

## 2020-04-06 DIAGNOSIS — N3000 Acute cystitis without hematuria: Secondary | ICD-10-CM | POA: Diagnosis not present

## 2020-04-06 DIAGNOSIS — N184 Chronic kidney disease, stage 4 (severe): Secondary | ICD-10-CM | POA: Diagnosis not present

## 2020-04-06 DIAGNOSIS — G894 Chronic pain syndrome: Secondary | ICD-10-CM | POA: Diagnosis not present

## 2020-04-06 DIAGNOSIS — M4316 Spondylolisthesis, lumbar region: Secondary | ICD-10-CM | POA: Diagnosis not present

## 2020-04-08 DIAGNOSIS — M4316 Spondylolisthesis, lumbar region: Secondary | ICD-10-CM | POA: Diagnosis not present

## 2020-04-08 DIAGNOSIS — M1A30X0 Chronic gout due to renal impairment, unspecified site, without tophus (tophi): Secondary | ICD-10-CM | POA: Diagnosis not present

## 2020-04-08 DIAGNOSIS — M150 Primary generalized (osteo)arthritis: Secondary | ICD-10-CM | POA: Diagnosis not present

## 2020-04-08 DIAGNOSIS — N3000 Acute cystitis without hematuria: Secondary | ICD-10-CM | POA: Diagnosis not present

## 2020-04-08 DIAGNOSIS — G894 Chronic pain syndrome: Secondary | ICD-10-CM | POA: Diagnosis not present

## 2020-04-08 DIAGNOSIS — N184 Chronic kidney disease, stage 4 (severe): Secondary | ICD-10-CM | POA: Diagnosis not present

## 2020-04-11 DIAGNOSIS — M150 Primary generalized (osteo)arthritis: Secondary | ICD-10-CM | POA: Diagnosis not present

## 2020-04-11 DIAGNOSIS — M1A30X0 Chronic gout due to renal impairment, unspecified site, without tophus (tophi): Secondary | ICD-10-CM | POA: Diagnosis not present

## 2020-04-11 DIAGNOSIS — M810 Age-related osteoporosis without current pathological fracture: Secondary | ICD-10-CM | POA: Diagnosis not present

## 2020-04-11 DIAGNOSIS — I872 Venous insufficiency (chronic) (peripheral): Secondary | ICD-10-CM | POA: Diagnosis not present

## 2020-04-11 DIAGNOSIS — Z9181 History of falling: Secondary | ICD-10-CM | POA: Diagnosis not present

## 2020-04-11 DIAGNOSIS — N3000 Acute cystitis without hematuria: Secondary | ICD-10-CM | POA: Diagnosis not present

## 2020-04-11 DIAGNOSIS — M6259 Muscle wasting and atrophy, not elsewhere classified, multiple sites: Secondary | ICD-10-CM | POA: Diagnosis not present

## 2020-04-11 DIAGNOSIS — E039 Hypothyroidism, unspecified: Secondary | ICD-10-CM | POA: Diagnosis not present

## 2020-04-11 DIAGNOSIS — Z7982 Long term (current) use of aspirin: Secondary | ICD-10-CM | POA: Diagnosis not present

## 2020-04-11 DIAGNOSIS — Z79891 Long term (current) use of opiate analgesic: Secondary | ICD-10-CM | POA: Diagnosis not present

## 2020-04-11 DIAGNOSIS — G894 Chronic pain syndrome: Secondary | ICD-10-CM | POA: Diagnosis not present

## 2020-04-11 DIAGNOSIS — R1312 Dysphagia, oropharyngeal phase: Secondary | ICD-10-CM | POA: Diagnosis not present

## 2020-04-11 DIAGNOSIS — N184 Chronic kidney disease, stage 4 (severe): Secondary | ICD-10-CM | POA: Diagnosis not present

## 2020-04-11 DIAGNOSIS — Z981 Arthrodesis status: Secondary | ICD-10-CM | POA: Diagnosis not present

## 2020-04-11 DIAGNOSIS — M4316 Spondylolisthesis, lumbar region: Secondary | ICD-10-CM | POA: Diagnosis not present

## 2020-04-11 DIAGNOSIS — K219 Gastro-esophageal reflux disease without esophagitis: Secondary | ICD-10-CM | POA: Diagnosis not present

## 2020-04-11 DIAGNOSIS — Z792 Long term (current) use of antibiotics: Secondary | ICD-10-CM | POA: Diagnosis not present

## 2020-04-13 DIAGNOSIS — N3000 Acute cystitis without hematuria: Secondary | ICD-10-CM | POA: Diagnosis not present

## 2020-04-13 DIAGNOSIS — M150 Primary generalized (osteo)arthritis: Secondary | ICD-10-CM | POA: Diagnosis not present

## 2020-04-13 DIAGNOSIS — N184 Chronic kidney disease, stage 4 (severe): Secondary | ICD-10-CM | POA: Diagnosis not present

## 2020-04-13 DIAGNOSIS — G894 Chronic pain syndrome: Secondary | ICD-10-CM | POA: Diagnosis not present

## 2020-04-13 DIAGNOSIS — M4316 Spondylolisthesis, lumbar region: Secondary | ICD-10-CM | POA: Diagnosis not present

## 2020-04-13 DIAGNOSIS — M1A30X0 Chronic gout due to renal impairment, unspecified site, without tophus (tophi): Secondary | ICD-10-CM | POA: Diagnosis not present

## 2020-04-13 NOTE — Progress Notes (Deleted)
Cardiology Office Note   Date:  04/13/2020   ID:  Carolyn Robertson, DOB May 23, 1946, MRN [id number]  PCP:  Burnard Bunting, MD  Cardiologist:   No primary care provider on file. Referring:  Burnard Bunting, MD  No chief complaint on file.     History of Present Illness: Carolyn Robertson is a 74 y.o. female who was referred by  *** for evaluation of chest pain and tachycardia.    She was in the hospital in May for urinary tract infection.  He is very debilitated and discussion with palliative care and it was suggested that she is in a nursing home.   Past Medical History:  Diagnosis Date  . Anxiety   . Arthritis    RA  . 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 Depression   . GERD (gastroesophageal reflux disease)   . History of blood transfusion    after miscarrige  . History of kidney stones    x 2  . Hypertension   . Hypothyroidism   . Incontinent of urine    incontinent at night  . Kidney failure   . Memory loss    "after TIA"  . Pneumonia    as a child and teenager  . Renal disorder    stage IV kidney disease  . Stroke ([place])   . Thyroid disease   . TIA (transient ischemic attack) 06/2018    Past Surgical History:  Procedure Laterality Date  . CHOLECYSTECTOMY    . DILATION AND CURETTAGE OF UTERUS    . EYE SURGERY Right    retina surgery  . KNEE SURGERY     bilat knees  . LUMBAR LAMINECTOMY/DECOMPRESSION MICRODISCECTOMY Right 04/12/2018   Procedure: MICRODISCECTOMY LUMBAR FOUR- LUMBAR FIVE;  Surgeon: Newman Pies, MD;  Location: [place];  Service: Neurosurgery;  Laterality: Right;  MICRODISCECTOMY LUMBAR FOUR- LUMBAR FIVE  . TONSILLECTOMY       Current Outpatient Medications  Medication Sig Dispense Refill  . aspirin 81 MG EC tablet Take 1 tablet (81 mg total) by mouth daily. 30 tablet 0  . baclofen (LIORESAL) 10 MG tablet Take 10 mg by mouth 2 (two) times daily as needed for muscle spasms.    . cyanocobalamin (,VITAMIN  B-12,) 1000 MCG/ML injection Inject 1 mL into the skin every 28 (twenty-eight) days.    . DULoxetine (CYMBALTA) 60 MG capsule Take 60 mg by mouth at bedtime.    Marland Kitchen estrogen, conjugated,-medroxyprogesterone (PREMPRO) 0.625-2.5 MG per tablet Take 1 tablet by mouth daily.    . Febuxostat 80 MG TABS Take 40 mg by mouth at bedtime.     . furosemide (LASIX) 40 MG tablet Take 40 mg by mouth daily as needed for edema.     . gabapentin (NEURONTIN) 100 MG capsule Take 100 mg by mouth daily.    Marland Kitchen HYDROcodone-acetaminophen (NORCO/VICODIN) 5-325 MG tablet Take 1 tablet by mouth every 6 (six) hours as needed. 12 tablet 0  . levothyroxine (SYNTHROID) 200 MCG tablet Take 200 mcg by mouth daily. Take 263mcg by mouth    . nystatin cream (MYCOSTATIN) Apply to affected area 2 times daily 15 g 1  . pantoprazole (PROTONIX) 40 MG tablet Take 40 mg by mouth daily before breakfast.  4  . SYNTHROID 175 MCG tablet Take 175 mcg by mouth daily before breakfast. Take 166mcg by mouth on Tuesdays, Thursdays, Saturdays and Sundays (Patient not taking: Reported on 03/30/2020)  1  . trimethoprim (TRIMPEX)  100 MG tablet Take 100 mg by mouth daily.     No current facility-administered medications for this visit.    Allergies:   Other, Penicillins, Sulfa antibiotics, Codeine, Hydrocodone-acetaminophen, Naproxen sodium, and Tape    Social History:  The patient  reports that she has never smoked. She has never used smokeless tobacco. She reports that she does not drink alcohol and does not use drugs.   Family History:  The patient's ***family history is not on file.    ROS:  Please see the history of present illness.   Otherwise, review of systems are positive for {NONE DEFAULTED:18576::"none"}.   All other systems are reviewed and negative.    PHYSICAL EXAM: VS:  LMP  (LMP Unknown)  , BMI There is no height or weight on file to calculate BMI. GENERAL:  Well appearing HEENT:  Pupils equal round and reactive, fundi not  visualized, oral mucosa unremarkable NECK:  No jugular venous distention, waveform within normal limits, carotid upstroke brisk and symmetric, no bruits, no thyromegaly LYMPHATICS:  No cervical, inguinal adenopathy LUNGS:  Clear to auscultation bilaterally BACK:  No CVA tenderness CHEST:  Unremarkable HEART:  PMI not displaced or sustained,S1 and S2 within normal limits, no S3, no S4, no clicks, no rubs, *** murmurs ABD:  Flat, positive bowel sounds normal in frequency in pitch, no bruits, no rebound, no guarding, no midline pulsatile mass, no hepatomegaly, no splenomegaly EXT:  2 plus pulses throughout, no edema, no cyanosis no clubbing SKIN:  No rashes no nodules NEURO:  Cranial nerves II through XII grossly intact, motor grossly intact throughout PSYCH:  Cognitively intact, oriented to person place and time    EKG:  EKG {ACTION; IS/IS XKP:53748270} ordered today. The ekg ordered today demonstrates ***   Recent Labs: 02/16/2020: ALT 10; Hemoglobin 12.5; Platelets 435; TSH 6.616 02/17/2020: BUN 23; Creatinine, Ser 1.95; Potassium 4.2; Sodium 137    Lipid Panel    Component Value Date/Time   CHOL 139 06/25/2018 0548   TRIG 94 06/25/2018 0548   HDL 71 06/25/2018 0548   CHOLHDL 2.0 06/25/2018 0548   VLDL 19 06/25/2018 0548   LDLCALC 49 06/25/2018 0548      Wt Readings from Last 3 Encounters:  02/16/20 130 lb (59 kg)  02/10/20 146 lb (66.2 kg)  04/12/19 150 lb (68 kg)      Other studies Reviewed: Additional studies/ records that were reviewed today include: ***. Review of the above records demonstrates:  Please see elsewhere in the note.  ***   ASSESSMENT AND PLAN:  CHEST PAIN:  ***  TACHYCARDIA:  ***    Current medicines are reviewed at length with the patient today.  The patient {ACTIONS; HAS/DOES NOT HAVE:19233} concerns regarding medicines.  The following changes have been made:  {PLAN; NO CHANGE:13088:s}  Labs/ tests ordered today include: *** No orders of  the defined types were placed in this encounter.    Disposition:   FU with ***    Signed, Minus Breeding, MD  04/13/2020 9:31 AM    [facility]

## 2020-04-14 ENCOUNTER — Ambulatory Visit: Payer: Medicare Other | Admitting: Cardiology

## 2020-04-14 DIAGNOSIS — Z7189 Other specified counseling: Secondary | ICD-10-CM | POA: Insufficient documentation

## 2020-04-14 DIAGNOSIS — R000 Tachycardia, unspecified: Secondary | ICD-10-CM | POA: Insufficient documentation

## 2020-04-14 DIAGNOSIS — R072 Precordial pain: Secondary | ICD-10-CM | POA: Insufficient documentation

## 2020-04-14 NOTE — Progress Notes (Signed)
Cardiology Office Note   Date:  04/15/2020   ID:  Carolyn Robertson, DOB 1946/02/12, MRN [id number]  PCP:  Burnard Bunting, MD  Cardiologist:   No primary care provider on file. Referring:  Burnard Bunting, MD  Chief Complaint  Patient presents with  . Chest Pain      History of Present Illness: Carolyn Robertson is a 74 y.o. female who was referred by  Burnard Bunting, MD for evaluation of chest pain and tachycardia.    She was in the hospital in May for urinary tract infection.  He is very debilitated and discussion with palliative care and it was suggested that she is in a nursing home.  She went to the nursing home and actually now is back home.  I did review her hospital records.  There were no apparent cardiac complications and I do not even see any enzymes during that time.  I do not see any mention of atrial fibrillation.  She has been noted to have irregular heart rhythm.  Today she is in atrial trigeminy.  She was sent for evaluation of this as well as chest discomfort.  She did have a work-up apparently 2019 and did have an echocardiogram that I can see.  This was because she had a syncopal episode that was unexplained.  She reports having a stress perfusion study at another practice but I do not have these records.  The echo was unremarkable.  She is otherwise not had any cardiac work-up.  She is very weak following her prolonged illness.  She is getting around a little bit with a walker and working with physical therapy.  With this she does not have any palpitations, presyncope or syncope.  She has never had another syncopal episode since 2019.  She does not have significant shortness of breath, PND or orthopnea.  However, at night she does get chest discomfort when lying flat.  She thinks this is different than her reflux.  At least one episode was severe.  Otherwise it can have a moderate nagging discomfort that is not clear during the day.  She does not associated with  foods.  There is no associated nausea vomiting diaphoresis or radiation to her jaw or arms.   Past Medical History:  Diagnosis Date  . Anxiety   . Arthritis    RA  . 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 Depression   . GERD (gastroesophageal reflux disease)   . History of blood transfusion    after miscarrige  . History of kidney stones    x 2  . Hypertension   . Hypothyroidism   . Incontinent of urine    incontinent at night  . Kidney failure   . Memory loss    "after TIA"  . Pneumonia    as a child and teenager  . Renal disorder    stage IV kidney disease  . Stroke ([place])   . Thyroid disease   . TIA (transient ischemic attack) 06/2018    Past Surgical History:  Procedure Laterality Date  . CHOLECYSTECTOMY    . DILATION AND CURETTAGE OF UTERUS    . EYE SURGERY Right    retina surgery  . KNEE SURGERY     bilat knees  . LUMBAR LAMINECTOMY/DECOMPRESSION MICRODISCECTOMY Right 04/12/2018   Procedure: MICRODISCECTOMY LUMBAR FOUR- LUMBAR FIVE;  Surgeon: Newman Pies, MD;  Location: [place];  Service: Neurosurgery;  Laterality: Right;  MICRODISCECTOMY LUMBAR FOUR- LUMBAR  FIVE  . TONSILLECTOMY       Current Outpatient Medications  Medication Sig Dispense Refill  . aspirin 81 MG EC tablet Take 1 tablet (81 mg total) by mouth daily. 30 tablet 0  . baclofen (LIORESAL) 10 MG tablet Take 10 mg by mouth 2 (two) times daily as needed for muscle spasms.    . cyanocobalamin (,VITAMIN B-12,) 1000 MCG/ML injection Inject 1 mL into the skin every 28 (twenty-eight) days.    . DULoxetine (CYMBALTA) 60 MG capsule Take 60 mg by mouth at bedtime.    Marland Kitchen estrogen, conjugated,-medroxyprogesterone (PREMPRO) 0.625-2.5 MG per tablet Take 1 tablet by mouth daily.    . Febuxostat 80 MG TABS Take 40 mg by mouth at bedtime.     . furosemide (LASIX) 40 MG tablet Take 40 mg by mouth daily as needed for edema.     . gabapentin (NEURONTIN) 100 MG capsule Take 100 mg by mouth  daily.    Marland Kitchen HYDROcodone-acetaminophen (NORCO/VICODIN) 5-325 MG tablet Take 1 tablet by mouth every 6 (six) hours as needed. 12 tablet 0  . levothyroxine (SYNTHROID) 200 MCG tablet Take 200 mcg by mouth daily. Take 265mcg by mouth    . nystatin cream (MYCOSTATIN) Apply to affected area 2 times daily 15 g 1  . pantoprazole (PROTONIX) 40 MG tablet Take 40 mg by mouth daily before breakfast.  4  . SYNTHROID 175 MCG tablet Take 175 mcg by mouth daily before breakfast. Take 155mcg by mouth on Tuesdays, Thursdays, Saturdays and Sundays  1  . trimethoprim (TRIMPEX) 100 MG tablet Take 100 mg by mouth daily.     No current facility-administered medications for this visit.    Allergies:   Other, Penicillins, Sulfa antibiotics, Codeine, Hydrocodone-acetaminophen, Naproxen sodium, and Tape    Social History:  The patient  reports that she has never smoked. She has never used smokeless tobacco. She reports that she does not drink alcohol and does not use drugs.   Family History:  The patient's family history includes Atrial fibrillation in her mother; Stroke in her father.    ROS:  Please see the history of present illness.   Otherwise, review of systems are positive for none.   All other systems are reviewed and negative.    PHYSICAL EXAM: VS:  BP 126/72   Pulse 99   Temp (!) 97.1 F (36.2 C)   Ht 5\' 4"  (1.626 m)   Wt 127 lb 12.8 oz (58 kg)   LMP  (LMP Unknown)   SpO2 98%   BMI 21.94 kg/m  , BMI Body mass index is 21.94 kg/m. GEN:  No distress NECK:  No jugular venous distention at 90 degrees, waveform within normal limits, carotid upstroke brisk and symmetric, no bruits, no thyromegaly LYMPHATICS:  No cervical adenopathy LUNGS:  Clear to auscultation bilaterally BACK:  No CVA tenderness CHEST:  Unremarkable HEART:  S1 and S2 within normal limits, no S3, no S4, no clicks, no rubs, no murmurs ABD:  Positive bowel sounds normal in frequency in pitch, no bruits, no rebound, no guarding,  unable to assess midline mass or bruit with the patient seated. EXT:  2 plus pulses throughout, trace edema, no cyanosis no clubbing SKIN:  No rashes no nodules NEURO:  Cranial nerves II through XII grossly intact, motor grossly intact throughout PSYCH:  Cognitively intact, oriented to person place and time   EKG:  EKG is ordered today. The ekg ordered today demonstrates sinus rhythm, rate 99, axis within normal  limits, intervals within normal limits, premature atrial contractions in a trigeminal pattern.   Recent Labs: 02/16/2020: ALT 10; Hemoglobin 12.5; Platelets 435; TSH 6.616 02/17/2020: BUN 23; Creatinine, Ser 1.95; Potassium 4.2; Sodium 137    Lipid Panel    Component Value Date/Time   CHOL 139 06/25/2018 0548   TRIG 94 06/25/2018 0548   HDL 71 06/25/2018 0548   CHOLHDL 2.0 06/25/2018 0548   VLDL 19 06/25/2018 0548   LDLCALC 49 06/25/2018 0548      Wt Readings from Last 3 Encounters:  04/15/20 127 lb 12.8 oz (58 kg)  02/16/20 130 lb (59 kg)  02/10/20 146 lb (66.2 kg)      Other studies Reviewed: Additional studies/ records that were reviewed today include: Hospital records. Review of the above records demonstrates:  Please see elsewhere in the note.     ASSESSMENT AND PLAN:  CHEST PAIN:    Chest pain is somewhat atypical.  However, there are cardiovascular risk factors.  It did not like her previous reflux.  The pretest probability of obstructive coronary disease is at least mild.  She would not be able to walk on a treadmill.  Therefore, she will have a [employer].  If this is negative I would not pursue further cardiac work-up.  TACHYCARDIA: She has premature atrial contractions.  During her hospitalization there was no suggestion of fibrillation.  I would not suspect this.  At this point I would not think further monitoring would be indicated.    Current medicines are reviewed at length with the patient today.  The patient does not have concerns regarding  medicines.  The following changes have been made:  no change  Labs/ tests ordered today include:   Orders Placed This Encounter  Procedures  . MYOCARDIAL PERFUSION IMAGING  . EKG 12-Lead     Disposition:   FU with me as needed.      Signed, Minus Breeding, MD  04/15/2020 4:20 PM    [facility]

## 2020-04-15 ENCOUNTER — Encounter: Payer: Self-pay | Admitting: Cardiology

## 2020-04-15 ENCOUNTER — Other Ambulatory Visit: Payer: Self-pay

## 2020-04-15 ENCOUNTER — Ambulatory Visit (INDEPENDENT_AMBULATORY_CARE_PROVIDER_SITE_OTHER): Payer: Medicare Other | Admitting: Cardiology

## 2020-04-15 VITALS — BP 126/72 | HR 99 | Temp 97.1°F | Ht 64.0 in | Wt 127.8 lb

## 2020-04-15 DIAGNOSIS — Z7189 Other specified counseling: Secondary | ICD-10-CM | POA: Diagnosis not present

## 2020-04-15 DIAGNOSIS — R072 Precordial pain: Secondary | ICD-10-CM | POA: Diagnosis not present

## 2020-04-15 DIAGNOSIS — R000 Tachycardia, unspecified: Secondary | ICD-10-CM | POA: Diagnosis not present

## 2020-04-15 NOTE — Patient Instructions (Signed)
Medication Instructions:  Your physician recommends that you continue on your current medications as directed. Please refer to the Current Medication list given to you today.  Testing/Procedures: Your physician has requested that you have a lexiscan myoview. For further information please visit [email or web address]. Please follow instruction sheet, as given.  Follow-Up: At [facility], you and your health needs are our priority.  As part of our continuing mission to provide you with exceptional heart care, we have created designated Provider Care Teams.  These Care Teams include your primary Cardiologist (physician) and Advanced Practice Providers (APPs -  Physician Assistants and Nurse Practitioners) who all work together to provide you with the care you need, when you need it.  We recommend signing up for the patient portal called "MyChart".  Sign up information is provided on this After Visit Summary.  MyChart is used to connect with patients for Virtual Visits (Telemedicine).  Patients are able to view lab/test results, encounter notes, upcoming appointments, etc.  Non-urgent messages can be sent to your provider as well.   To learn more about what you can do with MyChart, go to [email or web address].    Your next appointment:   AS NEEDED with Dr. Percival Spanish

## 2020-04-16 DIAGNOSIS — N3000 Acute cystitis without hematuria: Secondary | ICD-10-CM | POA: Diagnosis not present

## 2020-04-16 DIAGNOSIS — M4316 Spondylolisthesis, lumbar region: Secondary | ICD-10-CM | POA: Diagnosis not present

## 2020-04-16 DIAGNOSIS — N184 Chronic kidney disease, stage 4 (severe): Secondary | ICD-10-CM | POA: Diagnosis not present

## 2020-04-16 DIAGNOSIS — G894 Chronic pain syndrome: Secondary | ICD-10-CM | POA: Diagnosis not present

## 2020-04-16 DIAGNOSIS — M1A30X0 Chronic gout due to renal impairment, unspecified site, without tophus (tophi): Secondary | ICD-10-CM | POA: Diagnosis not present

## 2020-04-16 DIAGNOSIS — M150 Primary generalized (osteo)arthritis: Secondary | ICD-10-CM | POA: Diagnosis not present

## 2020-04-20 DIAGNOSIS — M1A30X0 Chronic gout due to renal impairment, unspecified site, without tophus (tophi): Secondary | ICD-10-CM | POA: Diagnosis not present

## 2020-04-20 DIAGNOSIS — N3000 Acute cystitis without hematuria: Secondary | ICD-10-CM | POA: Diagnosis not present

## 2020-04-20 DIAGNOSIS — M4316 Spondylolisthesis, lumbar region: Secondary | ICD-10-CM | POA: Diagnosis not present

## 2020-04-20 DIAGNOSIS — G894 Chronic pain syndrome: Secondary | ICD-10-CM | POA: Diagnosis not present

## 2020-04-20 DIAGNOSIS — N184 Chronic kidney disease, stage 4 (severe): Secondary | ICD-10-CM | POA: Diagnosis not present

## 2020-04-20 DIAGNOSIS — M150 Primary generalized (osteo)arthritis: Secondary | ICD-10-CM | POA: Diagnosis not present

## 2020-04-22 ENCOUNTER — Telehealth (HOSPITAL_COMMUNITY): Payer: Self-pay

## 2020-04-22 NOTE — Telephone Encounter (Signed)
Encounter complete. 

## 2020-04-23 ENCOUNTER — Telehealth (HOSPITAL_COMMUNITY): Payer: Self-pay

## 2020-04-23 NOTE — Telephone Encounter (Signed)
Encounter complete. 

## 2020-04-24 ENCOUNTER — Other Ambulatory Visit: Payer: Self-pay

## 2020-04-24 ENCOUNTER — Ambulatory Visit (HOSPITAL_COMMUNITY)
Admission: RE | Admit: 2020-04-24 | Discharge: 2020-04-24 | Disposition: A | Discharge status: Home / Self Care | Payer: Medicare Other | Source: Ambulatory Visit | Attending: Cardiovascular Disease | Admitting: Cardiovascular Disease

## 2020-04-24 DIAGNOSIS — R072 Precordial pain: Secondary | ICD-10-CM | POA: Diagnosis not present

## 2020-04-24 MED ORDER — TECHNETIUM TC 99M TETROFOSMIN IV KIT
10.4000 | PACK | Freq: Once | INTRAVENOUS | Status: AC | PRN
  Administered 2020-04-24: 10.4 via INTRAVENOUS
  Filled 2020-04-24: qty 11

## 2020-04-24 MED ORDER — REGADENOSON 0.4 MG/5ML IV SOLN
0.4000 mg | Freq: Once | INTRAVENOUS | Status: AC
  Administered 2020-04-24: 0.4 mg via INTRAVENOUS

## 2020-04-24 MED ORDER — TECHNETIUM TC 99M TETROFOSMIN IV KIT
31.9000 | PACK | Freq: Once | INTRAVENOUS | Status: AC | PRN
  Administered 2020-04-24: 31.9 via INTRAVENOUS
  Filled 2020-04-24: qty 32

## 2020-04-27 ENCOUNTER — Telehealth: Payer: Self-pay | Admitting: Cardiology

## 2020-04-27 DIAGNOSIS — N3000 Acute cystitis without hematuria: Secondary | ICD-10-CM | POA: Diagnosis not present

## 2020-04-27 DIAGNOSIS — M4316 Spondylolisthesis, lumbar region: Secondary | ICD-10-CM | POA: Diagnosis not present

## 2020-04-27 DIAGNOSIS — M1A30X0 Chronic gout due to renal impairment, unspecified site, without tophus (tophi): Secondary | ICD-10-CM | POA: Diagnosis not present

## 2020-04-27 DIAGNOSIS — M150 Primary generalized (osteo)arthritis: Secondary | ICD-10-CM | POA: Diagnosis not present

## 2020-04-27 DIAGNOSIS — N184 Chronic kidney disease, stage 4 (severe): Secondary | ICD-10-CM | POA: Diagnosis not present

## 2020-04-27 DIAGNOSIS — G894 Chronic pain syndrome: Secondary | ICD-10-CM | POA: Diagnosis not present

## 2020-04-27 NOTE — Telephone Encounter (Signed)
New message   Pt c/o of Chest Pain: STAT if CP now or developed within 24 hours  1. Are you having CP right now? No   2. Are you experiencing any other symptoms (ex. SOB, nausea, vomiting, sweating)? Chest tightness, irregular heartbeat   3. How long have you been experiencing CP? Since last week per Beth  4. Is your CP continuous or coming and going? Coming and going   5. Have you taken Nitroglycerin? No  ?

## 2020-04-27 NOTE — Telephone Encounter (Signed)
Spoke with Carolyn Robertson, by the end of last week the patient has been complaining of chest tightness that comes and goes.  Spoke with pt, she reports chest heaviness when lying down in the bed. No SOB. She states it feels like her muscles are tense. She has a cracked rib on the side. She also reports she does not feel her heart racing. She only feels the heaviness when she lays down. Will await the results of her nuclear test from Friday. Pt agreed with this plan.

## 2020-04-28 DIAGNOSIS — N184 Chronic kidney disease, stage 4 (severe): Secondary | ICD-10-CM | POA: Diagnosis not present

## 2020-04-28 DIAGNOSIS — G894 Chronic pain syndrome: Secondary | ICD-10-CM | POA: Diagnosis not present

## 2020-04-28 DIAGNOSIS — M150 Primary generalized (osteo)arthritis: Secondary | ICD-10-CM | POA: Diagnosis not present

## 2020-04-28 DIAGNOSIS — N3000 Acute cystitis without hematuria: Secondary | ICD-10-CM | POA: Diagnosis not present

## 2020-04-28 DIAGNOSIS — M1A30X0 Chronic gout due to renal impairment, unspecified site, without tophus (tophi): Secondary | ICD-10-CM | POA: Diagnosis not present

## 2020-04-28 DIAGNOSIS — M4316 Spondylolisthesis, lumbar region: Secondary | ICD-10-CM | POA: Diagnosis not present

## 2020-04-28 LAB — MYOCARDIAL PERFUSION IMAGING
LV dias vol: 74 mL (ref 46–106)
LV sys vol: 37 mL
Peak HR: 111 {beats}/min
Rest HR: 84 {beats}/min
SDS: 3
SRS: 1
SSS: 4
TID: 0.78

## 2020-04-29 ENCOUNTER — Other Ambulatory Visit: Payer: Self-pay | Admitting: *Deleted

## 2020-04-29 DIAGNOSIS — N3000 Acute cystitis without hematuria: Secondary | ICD-10-CM | POA: Diagnosis not present

## 2020-04-29 DIAGNOSIS — M150 Primary generalized (osteo)arthritis: Secondary | ICD-10-CM | POA: Diagnosis not present

## 2020-04-29 DIAGNOSIS — G894 Chronic pain syndrome: Secondary | ICD-10-CM | POA: Diagnosis not present

## 2020-04-29 DIAGNOSIS — N184 Chronic kidney disease, stage 4 (severe): Secondary | ICD-10-CM | POA: Diagnosis not present

## 2020-04-29 DIAGNOSIS — M4316 Spondylolisthesis, lumbar region: Secondary | ICD-10-CM | POA: Diagnosis not present

## 2020-04-29 DIAGNOSIS — M1A30X0 Chronic gout due to renal impairment, unspecified site, without tophus (tophi): Secondary | ICD-10-CM | POA: Diagnosis not present

## 2020-04-29 NOTE — Patient Outreach (Signed)
[facility]) Care Management  04/29/2020  Carolyn Robertson [birth year] [id number]   Telephone Assessment-Fall/safety prevention  RN spoke with pt today and verified pt continue to work with [facility] PT/OT/RN with no falls since the last conversation. Pt states he is doing much better however a day last week pt states she just "did not take her medications" no reason just was not felling well. States she felt worse not taking her medications and begin taking her medications. RN stress the importance of taking her medications and why this was importance. Pt verbalized an understandings and indicated she would always take her medications as prescribed. Discussed the plan of care and all goals along with interventions. Will re-evaluate next month concerning pt's progress.   Plan: Will follow up next month with ongoing case management needs.  Goals Addressed            This Visit's Progress    to get better with more strenght       CARE PLAN ENTRY (see longitudinal plan of care for additional care plan information)  Current Barriers:   Fall and safety  prevention post-op d/c  Nurse Case Manager Clinical Goal(s):   Over the next 90 days, patient will not experience hospital admission. Hospital Admissions in last 6 months = one  Over the next 30 days, patient will attend all scheduled medical appointments: verified pt has sufficient transportation to arrive at all appointments.  Over the next 30 days, patient will work with [employer]  PT/OT/RN (community agency) to improve her strength and ongoing self management of care  Interventions:   Inter-disciplinary care team collaboration (see longitudinal plan of care)  Provided education to patient re: fall and safety  prevention  Collaborated with provider regarding pt's disposition with [facility] enrollment  Discussed plans with patient for ongoing care management follow up and provided patient with direct contact information  for care management team  Provided patient with emmi educational materials related to fall prevention  Reviewed scheduled/upcoming provider appointments including:   Patient Self Care Activities:   Patient verbalizes understanding of plan to be safe and avoid falls and readmission related episodes.  Self administers medications as prescribed  Attends all scheduled provider appointments  Performs ADL's independently  Calls provider office for new concerns or questions  Does not adhere to prescribed medication regimen  Initial goal documentation        Carolyn Mina, RN Care Management Coordinator [place] Office [phone]

## 2020-04-30 DIAGNOSIS — N184 Chronic kidney disease, stage 4 (severe): Secondary | ICD-10-CM | POA: Diagnosis not present

## 2020-04-30 DIAGNOSIS — M1A30X0 Chronic gout due to renal impairment, unspecified site, without tophus (tophi): Secondary | ICD-10-CM | POA: Diagnosis not present

## 2020-04-30 DIAGNOSIS — M4316 Spondylolisthesis, lumbar region: Secondary | ICD-10-CM | POA: Diagnosis not present

## 2020-04-30 DIAGNOSIS — G459 Transient cerebral ischemic attack, unspecified: Secondary | ICD-10-CM | POA: Diagnosis not present

## 2020-04-30 DIAGNOSIS — M150 Primary generalized (osteo)arthritis: Secondary | ICD-10-CM | POA: Diagnosis not present

## 2020-04-30 DIAGNOSIS — E039 Hypothyroidism, unspecified: Secondary | ICD-10-CM | POA: Diagnosis not present

## 2020-04-30 DIAGNOSIS — N3000 Acute cystitis without hematuria: Secondary | ICD-10-CM | POA: Diagnosis not present

## 2020-04-30 DIAGNOSIS — G894 Chronic pain syndrome: Secondary | ICD-10-CM | POA: Diagnosis not present

## 2020-04-30 DIAGNOSIS — I639 Cerebral infarction, unspecified: Secondary | ICD-10-CM | POA: Diagnosis not present

## 2020-05-01 DIAGNOSIS — N184 Chronic kidney disease, stage 4 (severe): Secondary | ICD-10-CM | POA: Diagnosis not present

## 2020-05-01 DIAGNOSIS — N3000 Acute cystitis without hematuria: Secondary | ICD-10-CM | POA: Diagnosis not present

## 2020-05-01 DIAGNOSIS — M150 Primary generalized (osteo)arthritis: Secondary | ICD-10-CM | POA: Diagnosis not present

## 2020-05-01 DIAGNOSIS — M4316 Spondylolisthesis, lumbar region: Secondary | ICD-10-CM | POA: Diagnosis not present

## 2020-05-01 DIAGNOSIS — M1A30X0 Chronic gout due to renal impairment, unspecified site, without tophus (tophi): Secondary | ICD-10-CM | POA: Diagnosis not present

## 2020-05-01 DIAGNOSIS — G894 Chronic pain syndrome: Secondary | ICD-10-CM | POA: Diagnosis not present

## 2020-05-04 DIAGNOSIS — M150 Primary generalized (osteo)arthritis: Secondary | ICD-10-CM | POA: Diagnosis not present

## 2020-05-04 DIAGNOSIS — G894 Chronic pain syndrome: Secondary | ICD-10-CM | POA: Diagnosis not present

## 2020-05-04 DIAGNOSIS — N3000 Acute cystitis without hematuria: Secondary | ICD-10-CM | POA: Diagnosis not present

## 2020-05-04 DIAGNOSIS — M4316 Spondylolisthesis, lumbar region: Secondary | ICD-10-CM | POA: Diagnosis not present

## 2020-05-04 DIAGNOSIS — M1A30X0 Chronic gout due to renal impairment, unspecified site, without tophus (tophi): Secondary | ICD-10-CM | POA: Diagnosis not present

## 2020-05-04 DIAGNOSIS — N184 Chronic kidney disease, stage 4 (severe): Secondary | ICD-10-CM | POA: Diagnosis not present

## 2020-05-06 ENCOUNTER — Telehealth: Payer: Self-pay | Admitting: Cardiology

## 2020-05-06 DIAGNOSIS — M150 Primary generalized (osteo)arthritis: Secondary | ICD-10-CM | POA: Diagnosis not present

## 2020-05-06 DIAGNOSIS — N184 Chronic kidney disease, stage 4 (severe): Secondary | ICD-10-CM | POA: Diagnosis not present

## 2020-05-06 DIAGNOSIS — G894 Chronic pain syndrome: Secondary | ICD-10-CM | POA: Diagnosis not present

## 2020-05-06 DIAGNOSIS — M1A30X0 Chronic gout due to renal impairment, unspecified site, without tophus (tophi): Secondary | ICD-10-CM | POA: Diagnosis not present

## 2020-05-06 DIAGNOSIS — N3000 Acute cystitis without hematuria: Secondary | ICD-10-CM | POA: Diagnosis not present

## 2020-05-06 DIAGNOSIS — M4316 Spondylolisthesis, lumbar region: Secondary | ICD-10-CM | POA: Diagnosis not present

## 2020-05-06 NOTE — Telephone Encounter (Signed)
Called home health nurse- advised that the patient is known to have an irregular heart rhythm per note from [facility]- but nurse states that it was just really irregular today, more than normal. The patient is having no symptoms and does not complain of anything- but wanted [place] to be aware.   Advised with her that I would send a message over to have it reviewed, advised MD was on vacation but if patient needed [place] to call us, but we would have his recommendations on if appointment or EKG was needed.   Nurse thankful for call back.

## 2020-05-06 NOTE — Telephone Encounter (Signed)
STAT if HR is under 50 or over 120 (normal HR is 60-100 beats per minute)  1) What is your heart rate? 80's  2) Do you have a log of your heart rate readings (document readings)? 80's, 70's  3) Do you have any other symptoms? No   Beth from [facility] calling stating the patient has had an irregular heart rate every time she sees her. She states she does not think it is afib. She would like a nurse to call her or the patient back.

## 2020-05-10 NOTE — Telephone Encounter (Signed)
She should come in to get an EKG

## 2020-05-11 DIAGNOSIS — Z981 Arthrodesis status: Secondary | ICD-10-CM | POA: Diagnosis not present

## 2020-05-11 DIAGNOSIS — Z7982 Long term (current) use of aspirin: Secondary | ICD-10-CM | POA: Diagnosis not present

## 2020-05-11 DIAGNOSIS — I872 Venous insufficiency (chronic) (peripheral): Secondary | ICD-10-CM | POA: Diagnosis not present

## 2020-05-11 DIAGNOSIS — M1A30X0 Chronic gout due to renal impairment, unspecified site, without tophus (tophi): Secondary | ICD-10-CM | POA: Diagnosis not present

## 2020-05-11 DIAGNOSIS — M4316 Spondylolisthesis, lumbar region: Secondary | ICD-10-CM | POA: Diagnosis not present

## 2020-05-11 DIAGNOSIS — K219 Gastro-esophageal reflux disease without esophagitis: Secondary | ICD-10-CM | POA: Diagnosis not present

## 2020-05-11 DIAGNOSIS — M6259 Muscle wasting and atrophy, not elsewhere classified, multiple sites: Secondary | ICD-10-CM | POA: Diagnosis not present

## 2020-05-11 DIAGNOSIS — M150 Primary generalized (osteo)arthritis: Secondary | ICD-10-CM | POA: Diagnosis not present

## 2020-05-11 DIAGNOSIS — M810 Age-related osteoporosis without current pathological fracture: Secondary | ICD-10-CM | POA: Diagnosis not present

## 2020-05-11 DIAGNOSIS — Z9181 History of falling: Secondary | ICD-10-CM | POA: Diagnosis not present

## 2020-05-11 DIAGNOSIS — Z87440 Personal history of urinary (tract) infections: Secondary | ICD-10-CM | POA: Diagnosis not present

## 2020-05-11 DIAGNOSIS — Z79891 Long term (current) use of opiate analgesic: Secondary | ICD-10-CM | POA: Diagnosis not present

## 2020-05-11 DIAGNOSIS — G894 Chronic pain syndrome: Secondary | ICD-10-CM | POA: Diagnosis not present

## 2020-05-11 DIAGNOSIS — R1312 Dysphagia, oropharyngeal phase: Secondary | ICD-10-CM | POA: Diagnosis not present

## 2020-05-11 DIAGNOSIS — E039 Hypothyroidism, unspecified: Secondary | ICD-10-CM | POA: Diagnosis not present

## 2020-05-11 DIAGNOSIS — N184 Chronic kidney disease, stage 4 (severe): Secondary | ICD-10-CM | POA: Diagnosis not present

## 2020-05-11 DIAGNOSIS — Z792 Long term (current) use of antibiotics: Secondary | ICD-10-CM | POA: Diagnosis not present

## 2020-05-11 NOTE — Telephone Encounter (Signed)
Spoke to patient Dr.Hochrein advised needs to have a ekg.Nurse visit scheduled 7/27 at 2:00 pm for a ekg.

## 2020-05-11 NOTE — Telephone Encounter (Signed)
Patient needs to be set up for an EKG via nurse visit- per Dr.Hochrein-

## 2020-05-12 ENCOUNTER — Ambulatory Visit (INDEPENDENT_AMBULATORY_CARE_PROVIDER_SITE_OTHER): Payer: Medicare Other | Admitting: *Deleted

## 2020-05-12 ENCOUNTER — Other Ambulatory Visit: Payer: Self-pay

## 2020-05-12 VITALS — BP 128/68 | HR 98

## 2020-05-12 DIAGNOSIS — R0989 Other specified symptoms and signs involving the circulatory and respiratory systems: Secondary | ICD-10-CM | POA: Diagnosis not present

## 2020-05-12 NOTE — Progress Notes (Signed)
1.) Reason for visit: EKG  2.) Name of MD requesting visit: dr hochrein  3.) H&P: patient has a history of irregular heart beat  4.) ROS related to problem: patient reports the home health nurse was concerned about her irregular heart beat and dr hochrein asked for EKG. [patient reports she feels fine. She does not have any palpitations or other complaints.   5.) Assessment and plan per MD: ECG completed and reviewed by jesse cleaver np, sinus with pac's. No changes made. Will get ECG scanned and forward for dr hochrein's review.

## 2020-05-13 DIAGNOSIS — M150 Primary generalized (osteo)arthritis: Secondary | ICD-10-CM | POA: Diagnosis not present

## 2020-05-13 DIAGNOSIS — G894 Chronic pain syndrome: Secondary | ICD-10-CM | POA: Diagnosis not present

## 2020-05-13 DIAGNOSIS — M6259 Muscle wasting and atrophy, not elsewhere classified, multiple sites: Secondary | ICD-10-CM | POA: Diagnosis not present

## 2020-05-13 DIAGNOSIS — M4316 Spondylolisthesis, lumbar region: Secondary | ICD-10-CM | POA: Diagnosis not present

## 2020-05-13 DIAGNOSIS — M1A30X0 Chronic gout due to renal impairment, unspecified site, without tophus (tophi): Secondary | ICD-10-CM | POA: Diagnosis not present

## 2020-05-13 DIAGNOSIS — N184 Chronic kidney disease, stage 4 (severe): Secondary | ICD-10-CM | POA: Diagnosis not present

## 2020-05-18 DIAGNOSIS — M4316 Spondylolisthesis, lumbar region: Secondary | ICD-10-CM | POA: Diagnosis not present

## 2020-05-18 DIAGNOSIS — M1A30X0 Chronic gout due to renal impairment, unspecified site, without tophus (tophi): Secondary | ICD-10-CM | POA: Diagnosis not present

## 2020-05-18 DIAGNOSIS — M6259 Muscle wasting and atrophy, not elsewhere classified, multiple sites: Secondary | ICD-10-CM | POA: Diagnosis not present

## 2020-05-18 DIAGNOSIS — G894 Chronic pain syndrome: Secondary | ICD-10-CM | POA: Diagnosis not present

## 2020-05-18 DIAGNOSIS — M150 Primary generalized (osteo)arthritis: Secondary | ICD-10-CM | POA: Diagnosis not present

## 2020-05-18 DIAGNOSIS — N184 Chronic kidney disease, stage 4 (severe): Secondary | ICD-10-CM | POA: Diagnosis not present

## 2020-05-20 DIAGNOSIS — M1A30X0 Chronic gout due to renal impairment, unspecified site, without tophus (tophi): Secondary | ICD-10-CM | POA: Diagnosis not present

## 2020-05-20 DIAGNOSIS — N184 Chronic kidney disease, stage 4 (severe): Secondary | ICD-10-CM | POA: Diagnosis not present

## 2020-05-20 DIAGNOSIS — M150 Primary generalized (osteo)arthritis: Secondary | ICD-10-CM | POA: Diagnosis not present

## 2020-05-20 DIAGNOSIS — M4316 Spondylolisthesis, lumbar region: Secondary | ICD-10-CM | POA: Diagnosis not present

## 2020-05-20 DIAGNOSIS — M6259 Muscle wasting and atrophy, not elsewhere classified, multiple sites: Secondary | ICD-10-CM | POA: Diagnosis not present

## 2020-05-20 DIAGNOSIS — G894 Chronic pain syndrome: Secondary | ICD-10-CM | POA: Diagnosis not present

## 2020-05-22 DIAGNOSIS — M25519 Pain in unspecified shoulder: Secondary | ICD-10-CM | POA: Diagnosis not present

## 2020-05-22 DIAGNOSIS — M109 Gout, unspecified: Secondary | ICD-10-CM | POA: Diagnosis not present

## 2020-05-22 DIAGNOSIS — M25569 Pain in unspecified knee: Secondary | ICD-10-CM | POA: Diagnosis not present

## 2020-05-22 DIAGNOSIS — M810 Age-related osteoporosis without current pathological fracture: Secondary | ICD-10-CM | POA: Diagnosis not present

## 2020-05-22 DIAGNOSIS — M79643 Pain in unspecified hand: Secondary | ICD-10-CM | POA: Diagnosis not present

## 2020-05-22 DIAGNOSIS — M353 Polymyalgia rheumatica: Secondary | ICD-10-CM | POA: Diagnosis not present

## 2020-05-22 DIAGNOSIS — M1990 Unspecified osteoarthritis, unspecified site: Secondary | ICD-10-CM | POA: Diagnosis not present

## 2020-05-22 DIAGNOSIS — R768 Other specified abnormal immunological findings in serum: Secondary | ICD-10-CM | POA: Diagnosis not present

## 2020-05-22 DIAGNOSIS — M549 Dorsalgia, unspecified: Secondary | ICD-10-CM | POA: Diagnosis not present

## 2020-05-22 DIAGNOSIS — R5383 Other fatigue: Secondary | ICD-10-CM | POA: Diagnosis not present

## 2020-05-22 DIAGNOSIS — N189 Chronic kidney disease, unspecified: Secondary | ICD-10-CM | POA: Diagnosis not present

## 2020-05-22 DIAGNOSIS — M0609 Rheumatoid arthritis without rheumatoid factor, multiple sites: Secondary | ICD-10-CM | POA: Diagnosis not present

## 2020-05-25 DIAGNOSIS — M4316 Spondylolisthesis, lumbar region: Secondary | ICD-10-CM | POA: Diagnosis not present

## 2020-05-25 DIAGNOSIS — M1A30X0 Chronic gout due to renal impairment, unspecified site, without tophus (tophi): Secondary | ICD-10-CM | POA: Diagnosis not present

## 2020-05-25 DIAGNOSIS — M150 Primary generalized (osteo)arthritis: Secondary | ICD-10-CM | POA: Diagnosis not present

## 2020-05-25 DIAGNOSIS — M6259 Muscle wasting and atrophy, not elsewhere classified, multiple sites: Secondary | ICD-10-CM | POA: Diagnosis not present

## 2020-05-25 DIAGNOSIS — G894 Chronic pain syndrome: Secondary | ICD-10-CM | POA: Diagnosis not present

## 2020-05-25 DIAGNOSIS — N184 Chronic kidney disease, stage 4 (severe): Secondary | ICD-10-CM | POA: Diagnosis not present

## 2020-05-27 DIAGNOSIS — N184 Chronic kidney disease, stage 4 (severe): Secondary | ICD-10-CM | POA: Diagnosis not present

## 2020-05-27 DIAGNOSIS — M1A30X0 Chronic gout due to renal impairment, unspecified site, without tophus (tophi): Secondary | ICD-10-CM | POA: Diagnosis not present

## 2020-05-27 DIAGNOSIS — M150 Primary generalized (osteo)arthritis: Secondary | ICD-10-CM | POA: Diagnosis not present

## 2020-05-27 DIAGNOSIS — M4316 Spondylolisthesis, lumbar region: Secondary | ICD-10-CM | POA: Diagnosis not present

## 2020-05-27 DIAGNOSIS — G894 Chronic pain syndrome: Secondary | ICD-10-CM | POA: Diagnosis not present

## 2020-05-27 DIAGNOSIS — M6259 Muscle wasting and atrophy, not elsewhere classified, multiple sites: Secondary | ICD-10-CM | POA: Diagnosis not present

## 2020-05-28 ENCOUNTER — Other Ambulatory Visit: Payer: Self-pay | Admitting: *Deleted

## 2020-05-28 NOTE — Patient Outreach (Signed)
[facility]) Care Management  05/28/2020  RILIE GLANZ 1945-10-19 [id number]   Telephone Assessment-Falls/Prevention Measures  12:50pm-RN spoke with pt and verified she continue to do well with no falls and HHPT Alvis Lemmings) continues to work with her for the next few weeks. RN verified no problems getting to all her medical appointments and pt continues to take her medications as recommended.   RN continued to discuss the plan of care with no answer or feedback from the pt while on the land. RN made several attempts however no response from pt. Based upon no response RN attempted to call back however the line remains busy. RN called all listed on contact and spoke with a daughter 12:53pm-Valecia Beske who indicated pt has not being doing well later and provided numbers to the [place] with Alvis Lemmings and an aunt Anh Bigos [phone] who is 15 minutes away. Daughter aware RN case manager will call 911 for further assistance. 911 called at   Daughter able to communicate via text to RN case manager due to her work and stated she has reach out to [place] and call her aunt Christella Scheuermann to be in route as she has a key to the home.  Goals/interventions were addressed and confirmed prior to the above incident however will address at a later date based upon pt's current emergent situation.  1:36pm-Currently awaiting call back from EMS on pt's situation. 1:44pm- Daughter Lattie Haw reports pt is okay with her aunt Christella Scheuermann is at the residence. EMS still in route but will check the pt once arrived.  1:57- RN received a call from the pt indicated her went dead and did not respond. Pt call back on another number indicated she is "fine". Pt confirmed once again she remains on track based upon what was discussed earlier her existing plan of care.   Plan: Based upon this information will follow up in a few weeks with ongoing case management services on pt's progress. Will offer a discussion with the daughter  of high level of care or additional assistance in the home. Will also referral daughter to speak with the social worker at [place] based upon ongoing visits in the home with existing services. Also provided [facility] fax # for further consent and daughter has requested emails content.   Goals Addressed            This Visit's Progress   . to get better with more strenght   On track    [place] (see longitudinal plan of care for additional care plan information)  Current Barriers:  . Fall and safety  prevention post-op d/c  Nurse Case Manager Clinical Goal(s):  Marland Kitchen Over the next 90 days, patient will not experience hospital admission. Hospital Admissions in last 6 months = one . Over the next 30 days, patient will attend all scheduled medical appointments: verified pt has sufficient transportation to arrive at all appointments. . Over the next 30 days, patient will work with [employer]  PT/OT/RN (community agency) to improve her strength and ongoing self management of care  Interventions:  . Inter-disciplinary care team collaboration (see longitudinal plan of care) . Provided education to patient re: fall and safety  prevention . Collaborated with provider regarding pt's disposition with [place] enrollment . Discussed plans with patient for ongoing care management follow up and provided patient with direct contact information for care management team . Provided patient with emmi educational materials related to fall prevention . Reviewed scheduled/upcoming provider appointments including:  Patient Self Care Activities:  . Patient verbalizes understanding of plan to be safe and avoid falls and readmission related episodes. . Self administers medications as prescribed . Attends all scheduled provider appointments . Performs ADL's independently . Calls provider office for new concerns or questions . Does not adhere to prescribed medication regimen  Initial goal documentation            [name], RN Care Management Coordinator [place] Office [phone]

## 2020-06-01 DIAGNOSIS — M6259 Muscle wasting and atrophy, not elsewhere classified, multiple sites: Secondary | ICD-10-CM | POA: Diagnosis not present

## 2020-06-01 DIAGNOSIS — M4316 Spondylolisthesis, lumbar region: Secondary | ICD-10-CM | POA: Diagnosis not present

## 2020-06-01 DIAGNOSIS — M1A30X0 Chronic gout due to renal impairment, unspecified site, without tophus (tophi): Secondary | ICD-10-CM | POA: Diagnosis not present

## 2020-06-01 DIAGNOSIS — M150 Primary generalized (osteo)arthritis: Secondary | ICD-10-CM | POA: Diagnosis not present

## 2020-06-01 DIAGNOSIS — G894 Chronic pain syndrome: Secondary | ICD-10-CM | POA: Diagnosis not present

## 2020-06-01 DIAGNOSIS — N184 Chronic kidney disease, stage 4 (severe): Secondary | ICD-10-CM | POA: Diagnosis not present

## 2020-06-03 DIAGNOSIS — M4316 Spondylolisthesis, lumbar region: Secondary | ICD-10-CM | POA: Diagnosis not present

## 2020-06-03 DIAGNOSIS — M150 Primary generalized (osteo)arthritis: Secondary | ICD-10-CM | POA: Diagnosis not present

## 2020-06-03 DIAGNOSIS — M1A30X0 Chronic gout due to renal impairment, unspecified site, without tophus (tophi): Secondary | ICD-10-CM | POA: Diagnosis not present

## 2020-06-03 DIAGNOSIS — M6259 Muscle wasting and atrophy, not elsewhere classified, multiple sites: Secondary | ICD-10-CM | POA: Diagnosis not present

## 2020-06-03 DIAGNOSIS — G894 Chronic pain syndrome: Secondary | ICD-10-CM | POA: Diagnosis not present

## 2020-06-03 DIAGNOSIS — N184 Chronic kidney disease, stage 4 (severe): Secondary | ICD-10-CM | POA: Diagnosis not present

## 2020-06-08 DIAGNOSIS — M1A30X0 Chronic gout due to renal impairment, unspecified site, without tophus (tophi): Secondary | ICD-10-CM | POA: Diagnosis not present

## 2020-06-08 DIAGNOSIS — N184 Chronic kidney disease, stage 4 (severe): Secondary | ICD-10-CM | POA: Diagnosis not present

## 2020-06-08 DIAGNOSIS — M150 Primary generalized (osteo)arthritis: Secondary | ICD-10-CM | POA: Diagnosis not present

## 2020-06-08 DIAGNOSIS — M6259 Muscle wasting and atrophy, not elsewhere classified, multiple sites: Secondary | ICD-10-CM | POA: Diagnosis not present

## 2020-06-08 DIAGNOSIS — M4316 Spondylolisthesis, lumbar region: Secondary | ICD-10-CM | POA: Diagnosis not present

## 2020-06-08 DIAGNOSIS — G894 Chronic pain syndrome: Secondary | ICD-10-CM | POA: Diagnosis not present

## 2020-06-10 DIAGNOSIS — Z7982 Long term (current) use of aspirin: Secondary | ICD-10-CM | POA: Diagnosis not present

## 2020-06-10 DIAGNOSIS — Z981 Arthrodesis status: Secondary | ICD-10-CM | POA: Diagnosis not present

## 2020-06-10 DIAGNOSIS — Z87440 Personal history of urinary (tract) infections: Secondary | ICD-10-CM | POA: Diagnosis not present

## 2020-06-10 DIAGNOSIS — K219 Gastro-esophageal reflux disease without esophagitis: Secondary | ICD-10-CM | POA: Diagnosis not present

## 2020-06-10 DIAGNOSIS — G894 Chronic pain syndrome: Secondary | ICD-10-CM | POA: Diagnosis not present

## 2020-06-10 DIAGNOSIS — M6259 Muscle wasting and atrophy, not elsewhere classified, multiple sites: Secondary | ICD-10-CM | POA: Diagnosis not present

## 2020-06-10 DIAGNOSIS — Z79891 Long term (current) use of opiate analgesic: Secondary | ICD-10-CM | POA: Diagnosis not present

## 2020-06-10 DIAGNOSIS — I872 Venous insufficiency (chronic) (peripheral): Secondary | ICD-10-CM | POA: Diagnosis not present

## 2020-06-10 DIAGNOSIS — E039 Hypothyroidism, unspecified: Secondary | ICD-10-CM | POA: Diagnosis not present

## 2020-06-10 DIAGNOSIS — M150 Primary generalized (osteo)arthritis: Secondary | ICD-10-CM | POA: Diagnosis not present

## 2020-06-10 DIAGNOSIS — N184 Chronic kidney disease, stage 4 (severe): Secondary | ICD-10-CM | POA: Diagnosis not present

## 2020-06-10 DIAGNOSIS — M1A30X0 Chronic gout due to renal impairment, unspecified site, without tophus (tophi): Secondary | ICD-10-CM | POA: Diagnosis not present

## 2020-06-10 DIAGNOSIS — R1312 Dysphagia, oropharyngeal phase: Secondary | ICD-10-CM | POA: Diagnosis not present

## 2020-06-10 DIAGNOSIS — Z9181 History of falling: Secondary | ICD-10-CM | POA: Diagnosis not present

## 2020-06-10 DIAGNOSIS — M810 Age-related osteoporosis without current pathological fracture: Secondary | ICD-10-CM | POA: Diagnosis not present

## 2020-06-10 DIAGNOSIS — M4316 Spondylolisthesis, lumbar region: Secondary | ICD-10-CM | POA: Diagnosis not present

## 2020-06-10 DIAGNOSIS — Z792 Long term (current) use of antibiotics: Secondary | ICD-10-CM | POA: Diagnosis not present

## 2020-06-11 DIAGNOSIS — G894 Chronic pain syndrome: Secondary | ICD-10-CM | POA: Diagnosis not present

## 2020-06-11 DIAGNOSIS — N184 Chronic kidney disease, stage 4 (severe): Secondary | ICD-10-CM | POA: Diagnosis not present

## 2020-06-11 DIAGNOSIS — M150 Primary generalized (osteo)arthritis: Secondary | ICD-10-CM | POA: Diagnosis not present

## 2020-06-11 DIAGNOSIS — M6259 Muscle wasting and atrophy, not elsewhere classified, multiple sites: Secondary | ICD-10-CM | POA: Diagnosis not present

## 2020-06-11 DIAGNOSIS — M1A30X0 Chronic gout due to renal impairment, unspecified site, without tophus (tophi): Secondary | ICD-10-CM | POA: Diagnosis not present

## 2020-06-11 DIAGNOSIS — M4316 Spondylolisthesis, lumbar region: Secondary | ICD-10-CM | POA: Diagnosis not present

## 2020-06-15 DIAGNOSIS — M1A30X0 Chronic gout due to renal impairment, unspecified site, without tophus (tophi): Secondary | ICD-10-CM | POA: Diagnosis not present

## 2020-06-15 DIAGNOSIS — M6259 Muscle wasting and atrophy, not elsewhere classified, multiple sites: Secondary | ICD-10-CM | POA: Diagnosis not present

## 2020-06-15 DIAGNOSIS — G894 Chronic pain syndrome: Secondary | ICD-10-CM | POA: Diagnosis not present

## 2020-06-15 DIAGNOSIS — N184 Chronic kidney disease, stage 4 (severe): Secondary | ICD-10-CM | POA: Diagnosis not present

## 2020-06-15 DIAGNOSIS — M150 Primary generalized (osteo)arthritis: Secondary | ICD-10-CM | POA: Diagnosis not present

## 2020-06-15 DIAGNOSIS — M4316 Spondylolisthesis, lumbar region: Secondary | ICD-10-CM | POA: Diagnosis not present

## 2020-06-17 DIAGNOSIS — M1A30X0 Chronic gout due to renal impairment, unspecified site, without tophus (tophi): Secondary | ICD-10-CM | POA: Diagnosis not present

## 2020-06-17 DIAGNOSIS — G894 Chronic pain syndrome: Secondary | ICD-10-CM | POA: Diagnosis not present

## 2020-06-17 DIAGNOSIS — N184 Chronic kidney disease, stage 4 (severe): Secondary | ICD-10-CM | POA: Diagnosis not present

## 2020-06-17 DIAGNOSIS — M4316 Spondylolisthesis, lumbar region: Secondary | ICD-10-CM | POA: Diagnosis not present

## 2020-06-17 DIAGNOSIS — M150 Primary generalized (osteo)arthritis: Secondary | ICD-10-CM | POA: Diagnosis not present

## 2020-06-17 DIAGNOSIS — M6259 Muscle wasting and atrophy, not elsewhere classified, multiple sites: Secondary | ICD-10-CM | POA: Diagnosis not present

## 2020-06-19 DIAGNOSIS — G894 Chronic pain syndrome: Secondary | ICD-10-CM | POA: Diagnosis not present

## 2020-06-19 DIAGNOSIS — M150 Primary generalized (osteo)arthritis: Secondary | ICD-10-CM | POA: Diagnosis not present

## 2020-06-19 DIAGNOSIS — N184 Chronic kidney disease, stage 4 (severe): Secondary | ICD-10-CM | POA: Diagnosis not present

## 2020-06-19 DIAGNOSIS — M1A30X0 Chronic gout due to renal impairment, unspecified site, without tophus (tophi): Secondary | ICD-10-CM | POA: Diagnosis not present

## 2020-06-19 DIAGNOSIS — M6259 Muscle wasting and atrophy, not elsewhere classified, multiple sites: Secondary | ICD-10-CM | POA: Diagnosis not present

## 2020-06-19 DIAGNOSIS — M4316 Spondylolisthesis, lumbar region: Secondary | ICD-10-CM | POA: Diagnosis not present

## 2020-06-23 DIAGNOSIS — M79643 Pain in unspecified hand: Secondary | ICD-10-CM | POA: Diagnosis not present

## 2020-06-23 DIAGNOSIS — Z79899 Other long term (current) drug therapy: Secondary | ICD-10-CM | POA: Diagnosis not present

## 2020-06-23 DIAGNOSIS — M4316 Spondylolisthesis, lumbar region: Secondary | ICD-10-CM | POA: Diagnosis not present

## 2020-06-23 DIAGNOSIS — M25569 Pain in unspecified knee: Secondary | ICD-10-CM | POA: Diagnosis not present

## 2020-06-23 DIAGNOSIS — M549 Dorsalgia, unspecified: Secondary | ICD-10-CM | POA: Diagnosis not present

## 2020-06-23 DIAGNOSIS — R768 Other specified abnormal immunological findings in serum: Secondary | ICD-10-CM | POA: Diagnosis not present

## 2020-06-23 DIAGNOSIS — M1A30X0 Chronic gout due to renal impairment, unspecified site, without tophus (tophi): Secondary | ICD-10-CM | POA: Diagnosis not present

## 2020-06-23 DIAGNOSIS — M150 Primary generalized (osteo)arthritis: Secondary | ICD-10-CM | POA: Diagnosis not present

## 2020-06-23 DIAGNOSIS — M0609 Rheumatoid arthritis without rheumatoid factor, multiple sites: Secondary | ICD-10-CM | POA: Diagnosis not present

## 2020-06-23 DIAGNOSIS — N189 Chronic kidney disease, unspecified: Secondary | ICD-10-CM | POA: Diagnosis not present

## 2020-06-23 DIAGNOSIS — M109 Gout, unspecified: Secondary | ICD-10-CM | POA: Diagnosis not present

## 2020-06-23 DIAGNOSIS — M810 Age-related osteoporosis without current pathological fracture: Secondary | ICD-10-CM | POA: Diagnosis not present

## 2020-06-23 DIAGNOSIS — M6259 Muscle wasting and atrophy, not elsewhere classified, multiple sites: Secondary | ICD-10-CM | POA: Diagnosis not present

## 2020-06-23 DIAGNOSIS — M353 Polymyalgia rheumatica: Secondary | ICD-10-CM | POA: Diagnosis not present

## 2020-06-23 DIAGNOSIS — N184 Chronic kidney disease, stage 4 (severe): Secondary | ICD-10-CM | POA: Diagnosis not present

## 2020-06-23 DIAGNOSIS — M1990 Unspecified osteoarthritis, unspecified site: Secondary | ICD-10-CM | POA: Diagnosis not present

## 2020-06-23 DIAGNOSIS — G894 Chronic pain syndrome: Secondary | ICD-10-CM | POA: Diagnosis not present

## 2020-06-23 DIAGNOSIS — M25519 Pain in unspecified shoulder: Secondary | ICD-10-CM | POA: Diagnosis not present

## 2020-06-24 ENCOUNTER — Other Ambulatory Visit: Payer: Self-pay | Admitting: *Deleted

## 2020-06-24 NOTE — Patient Outreach (Signed)
[facility] Care Management  06/24/2020  Carolyn Robertson 06/09/1946 [id number]   Telephone Assessment-Unsuccessful  RN attempted outreach call to pt today however unsuccessful. RN able to leave a HIPAA approved voice message requesting a call back.  Plan: Will complete another outreach call next week for ongoing [facility] services.  [name], RN Care Management Coordinator [place] Office [phone]

## 2020-06-25 DIAGNOSIS — M150 Primary generalized (osteo)arthritis: Secondary | ICD-10-CM | POA: Diagnosis not present

## 2020-06-25 DIAGNOSIS — M1A30X0 Chronic gout due to renal impairment, unspecified site, without tophus (tophi): Secondary | ICD-10-CM | POA: Diagnosis not present

## 2020-06-25 DIAGNOSIS — N184 Chronic kidney disease, stage 4 (severe): Secondary | ICD-10-CM | POA: Diagnosis not present

## 2020-06-25 DIAGNOSIS — M4316 Spondylolisthesis, lumbar region: Secondary | ICD-10-CM | POA: Diagnosis not present

## 2020-06-25 DIAGNOSIS — G894 Chronic pain syndrome: Secondary | ICD-10-CM | POA: Diagnosis not present

## 2020-06-25 DIAGNOSIS — M6259 Muscle wasting and atrophy, not elsewhere classified, multiple sites: Secondary | ICD-10-CM | POA: Diagnosis not present

## 2020-06-29 ENCOUNTER — Telehealth: Payer: Self-pay

## 2020-06-29 NOTE — Telephone Encounter (Signed)
Telephone call to patient to schedule palliative care visit with patient. Patient/family in agreement with home visit on 12-09-19 at 1:00PM

## 2020-06-30 DIAGNOSIS — M170 Bilateral primary osteoarthritis of knee: Secondary | ICD-10-CM | POA: Diagnosis not present

## 2020-07-01 DIAGNOSIS — M1A30X0 Chronic gout due to renal impairment, unspecified site, without tophus (tophi): Secondary | ICD-10-CM | POA: Diagnosis not present

## 2020-07-01 DIAGNOSIS — N184 Chronic kidney disease, stage 4 (severe): Secondary | ICD-10-CM | POA: Diagnosis not present

## 2020-07-01 DIAGNOSIS — M4316 Spondylolisthesis, lumbar region: Secondary | ICD-10-CM | POA: Diagnosis not present

## 2020-07-01 DIAGNOSIS — G894 Chronic pain syndrome: Secondary | ICD-10-CM | POA: Diagnosis not present

## 2020-07-01 DIAGNOSIS — M6259 Muscle wasting and atrophy, not elsewhere classified, multiple sites: Secondary | ICD-10-CM | POA: Diagnosis not present

## 2020-07-01 DIAGNOSIS — M150 Primary generalized (osteo)arthritis: Secondary | ICD-10-CM | POA: Diagnosis not present

## 2020-07-02 ENCOUNTER — Other Ambulatory Visit: Payer: Self-pay | Admitting: *Deleted

## 2020-07-02 NOTE — Patient Outreach (Signed)
[facility]) Care Management  07/02/2020  NYOMIE EHRLICH [birth year] [id number]   Telephone Assessment-Successful  Pt denies any falls since the last conversation with Alvis Lemmings ongoing involvement with 4 visits left over the upcoming month. Pt states she continues to use her walker and states she is "progressing and getting better". All medications reviewed as pt confirmed ongoing adherence with all her medications and attending all medical appointments with no acute issues or delays. Reviewed the current plan of care as pt remains on track and continues to be receptive to the interventions discussed today.   Plan: Will continue to encourage adherence and update the provider on pt's disposition with [facility] services. Will follow up next month prior to possible discussion on pt's long term plan in preventing admission and continuing to apply the ongoing suggested exercises for building her strength after [facility] PT has ended. Will continue to offer needed tools and/or resources for pt's ongoing management of care.  Goals Addressed            This Visit's Progress   . 07/02/2020 [facility] UPDATE   On track    CARE PLAN ENTRY (see longitudinal plan of care for additional care plan information)  Current Barriers:  . Fall and safety  prevention post-op d/c  Nurse Case Manager Clinical Goal(s):  Marland Kitchen Over the next 90 days, patient will not experience hospital admission. Hospital Admissions in last 6 months = one . Over the next 30 days, patient will attend all scheduled medical appointments: verified pt has sufficient transportation to arrive at all appointments. . Over the next 30 days, patient will work with [employer]  PT/OT/RN (community agency) to improve her strength and ongoing self management of care  Interventions:  . Inter-disciplinary care team collaboration (see longitudinal plan of care) . Provided education to patient re: fall and safety  prevention . Collaborated with  provider regarding pt's disposition with [place] enrollment . Discussed plans with patient for ongoing care management follow up and provided patient with direct contact information for care management team . Provided patient with emmi educational materials related to fall prevention . Reviewed scheduled/upcoming provider appointments including:   Patient Self Care Activities:  . Patient verbalizes understanding of plan to be safe and avoid falls and readmission related episodes. . Self administers medications as prescribed . Attends all scheduled provider appointments . Performs ADL's independently . Calls provider office for new concerns or questions . Does not adhere to prescribed medication regimen  Initial goal documentation        [name], RN Care Management Coordinator [place] Office [phone]

## 2020-07-03 DIAGNOSIS — G894 Chronic pain syndrome: Secondary | ICD-10-CM | POA: Diagnosis not present

## 2020-07-03 DIAGNOSIS — M150 Primary generalized (osteo)arthritis: Secondary | ICD-10-CM | POA: Diagnosis not present

## 2020-07-03 DIAGNOSIS — M1A30X0 Chronic gout due to renal impairment, unspecified site, without tophus (tophi): Secondary | ICD-10-CM | POA: Diagnosis not present

## 2020-07-03 DIAGNOSIS — M4316 Spondylolisthesis, lumbar region: Secondary | ICD-10-CM | POA: Diagnosis not present

## 2020-07-03 DIAGNOSIS — N184 Chronic kidney disease, stage 4 (severe): Secondary | ICD-10-CM | POA: Diagnosis not present

## 2020-07-03 DIAGNOSIS — M6259 Muscle wasting and atrophy, not elsewhere classified, multiple sites: Secondary | ICD-10-CM | POA: Diagnosis not present

## 2020-07-06 DIAGNOSIS — M4316 Spondylolisthesis, lumbar region: Secondary | ICD-10-CM | POA: Diagnosis not present

## 2020-07-06 DIAGNOSIS — N184 Chronic kidney disease, stage 4 (severe): Secondary | ICD-10-CM | POA: Diagnosis not present

## 2020-07-06 DIAGNOSIS — M6259 Muscle wasting and atrophy, not elsewhere classified, multiple sites: Secondary | ICD-10-CM | POA: Diagnosis not present

## 2020-07-06 DIAGNOSIS — M1A30X0 Chronic gout due to renal impairment, unspecified site, without tophus (tophi): Secondary | ICD-10-CM | POA: Diagnosis not present

## 2020-07-06 DIAGNOSIS — M150 Primary generalized (osteo)arthritis: Secondary | ICD-10-CM | POA: Diagnosis not present

## 2020-07-06 DIAGNOSIS — G894 Chronic pain syndrome: Secondary | ICD-10-CM | POA: Diagnosis not present

## 2020-07-08 ENCOUNTER — Other Ambulatory Visit: Payer: Medicare Other

## 2020-07-08 ENCOUNTER — Other Ambulatory Visit: Payer: Self-pay

## 2020-07-08 DIAGNOSIS — Z515 Encounter for palliative care: Secondary | ICD-10-CM

## 2020-07-09 DIAGNOSIS — M6259 Muscle wasting and atrophy, not elsewhere classified, multiple sites: Secondary | ICD-10-CM | POA: Diagnosis not present

## 2020-07-09 DIAGNOSIS — N184 Chronic kidney disease, stage 4 (severe): Secondary | ICD-10-CM | POA: Diagnosis not present

## 2020-07-09 DIAGNOSIS — M4316 Spondylolisthesis, lumbar region: Secondary | ICD-10-CM | POA: Diagnosis not present

## 2020-07-09 DIAGNOSIS — G894 Chronic pain syndrome: Secondary | ICD-10-CM | POA: Diagnosis not present

## 2020-07-09 DIAGNOSIS — M150 Primary generalized (osteo)arthritis: Secondary | ICD-10-CM | POA: Diagnosis not present

## 2020-07-09 DIAGNOSIS — M1A30X0 Chronic gout due to renal impairment, unspecified site, without tophus (tophi): Secondary | ICD-10-CM | POA: Diagnosis not present

## 2020-07-10 NOTE — Progress Notes (Signed)
COMMUNITY PALLIATIVE CARE SW NOTE  PATIENT NAME: Carolyn Robertson DOB: Jan 30, 1946 MRN: [id number]  PRIMARY CARE PROVIDER: Burnard Bunting, MD  RESPONSIBLE PARTY:  Acct ID - Guarantor Home Phone Work Phone Relationship Acct Type  [id number] [name]  Self P/F     [address] [place], [place] [address]   Due to the COVID-19, this visit was done via telephone from my car and it was initiated and consent by this patient. This was a scheduled visit.   PLAN OF CARE and INTERVENTIONS:             1. GOALS OF CARE/ ADVANCE CARE PLANNING:  Goal is for patient to remain as independent as possible. Patient is a FULL CODE. 2. SOCIAL/EMOTIONAL/SPIRITUAL ASSESSMENT/ INTERVENTIONS:  SW arrived at the home for a scheduled visit. However, due to her husband unwilling to put his dog away, he asked that SW call patient from her car. Her husband-Hubert noted that he had no where to put the dog away. SW returned to her car and telephoned patient. Patient apologized for not getting up to come talk to SW. She report that she was doing well. She stated that his hoping to get her flu and COVID booster shot soon. She report that she is utilizing her walker to ambulate. She report that her appetite is good and feels she has gained weight. She did not know how much weight she had gained but shared that her clothes were getting tighter. Patient report ongoing intermittent pain to her back and arthritis flair ups. Patient has one more visit for physical therapy. Her husband assist her with personal care tasks. Patient report no other concerns. SW reiterated to her palliative care services and support availability. SW encouraged her to call with any changes in condition or questions. SW advised that the team will continue monthly telephone calls to assess any needs and provide support.  3. PATIENT/CAREGIVER EDUCATION/ COPING:  Patient seems to be alert and oriented x3. She seems to be coping well.   4. PERSONAL EMERGENCY PLAN:  911 can be activated for emergencies.  5. COMMUNITY RESOURCES COORDINATION/ HEALTH CARE NAVIGATION:  SW reinforced access and support of palliative care services.  6. FINANCIAL/LEGAL CONCERNS/INTERVENTIONS:  None.     SOCIAL HX:  Social History   Tobacco Use  . Smoking status: Never Smoker  . Smokeless tobacco: Never Used  Substance Use Topics  . Alcohol use: No    Comment: rarely    CODE STATUS: FULL CODE ADVANCED DIRECTIVES: No MOST FORM COMPLETE:  No HOSPICE EDUCATION PROVIDED: No  PPS: Patient ambulates with a wheelchair or walker. She is independent of ADL's but requires standby assistance as a safety precaution. She is alert and oriented x3.   Duration of visit and documentation 45 minutes.       [address] [place], 

## 2020-07-21 DIAGNOSIS — Z961 Presence of intraocular lens: Secondary | ICD-10-CM | POA: Diagnosis not present

## 2020-07-21 DIAGNOSIS — Z9889 Other specified postprocedural states: Secondary | ICD-10-CM | POA: Diagnosis not present

## 2020-07-21 DIAGNOSIS — H04123 Dry eye syndrome of bilateral lacrimal glands: Secondary | ICD-10-CM | POA: Diagnosis not present

## 2020-07-21 DIAGNOSIS — H1045 Other chronic allergic conjunctivitis: Secondary | ICD-10-CM | POA: Diagnosis not present

## 2020-07-21 DIAGNOSIS — H02403 Unspecified ptosis of bilateral eyelids: Secondary | ICD-10-CM | POA: Diagnosis not present

## 2020-07-21 DIAGNOSIS — H35372 Puckering of macula, left eye: Secondary | ICD-10-CM | POA: Diagnosis not present

## 2020-07-22 DIAGNOSIS — E039 Hypothyroidism, unspecified: Secondary | ICD-10-CM | POA: Diagnosis not present

## 2020-07-22 DIAGNOSIS — Z23 Encounter for immunization: Secondary | ICD-10-CM | POA: Diagnosis not present

## 2020-07-22 DIAGNOSIS — I872 Venous insufficiency (chronic) (peripheral): Secondary | ICD-10-CM | POA: Diagnosis not present

## 2020-07-22 DIAGNOSIS — E663 Overweight: Secondary | ICD-10-CM | POA: Diagnosis not present

## 2020-07-22 DIAGNOSIS — I10 Essential (primary) hypertension: Secondary | ICD-10-CM | POA: Diagnosis not present

## 2020-07-22 DIAGNOSIS — R531 Weakness: Secondary | ICD-10-CM | POA: Diagnosis not present

## 2020-07-23 DIAGNOSIS — N390 Urinary tract infection, site not specified: Secondary | ICD-10-CM | POA: Diagnosis not present

## 2020-07-23 DIAGNOSIS — R300 Dysuria: Secondary | ICD-10-CM | POA: Diagnosis not present

## 2020-07-31 ENCOUNTER — Other Ambulatory Visit: Payer: Self-pay | Admitting: *Deleted

## 2020-07-31 NOTE — Patient Outreach (Signed)
[facility]) Care Management  07/31/2020  MATTELYN IMHOFF September 24, 1946 [id number]   Telephone Assessment-Unsuccessful  RN attempted outreach call today however unsuccessful. RN able to leave a HIPAA approved voice message requesting a call back.  Will outreach once again over the next week for upon on pt's ongoing management of care.  [name], RN Care Management Coordinator [place] Office [phone]

## 2020-08-05 ENCOUNTER — Other Ambulatory Visit: Payer: Self-pay | Admitting: *Deleted

## 2020-08-05 NOTE — Patient Outreach (Signed)
[facility]) Care Management  08/05/2020  BOBBETTE EAKES [birth year] [id number]   Outreach #2 Unsuccessful  RN attempted outreach call however unsuccessful. RN able to leave a HIPAA approved voice message requesting a call back.  Will follow up over the next week and will send outreach letter according to workflow.  [name], RN Care Management Coordinator [place] Office [phone]

## 2020-08-11 ENCOUNTER — Other Ambulatory Visit: Payer: Self-pay | Admitting: *Deleted

## 2020-08-11 NOTE — Patient Outreach (Signed)
[facility]) Care Management  08/11/2020  RONNA HERSKOWITZ 1946-06-29 [id number]   Telephone Assessment-Unsuccessful  Outreach #3  RN attempted outreach call however unsuccessful. RN able to leave a HIPAA approved voice message requesting a call back.  Will follow up once again in a few weeks  with ongoing [facility] services. No response to the outreach letter or calls left to this pt.   [name], RN Care Management Coordinator [place] [employer] Office [phone]

## 2020-08-28 DIAGNOSIS — I10 Essential (primary) hypertension: Secondary | ICD-10-CM | POA: Diagnosis not present

## 2020-08-31 ENCOUNTER — Other Ambulatory Visit: Payer: Medicare Other

## 2020-08-31 ENCOUNTER — Other Ambulatory Visit: Payer: Self-pay

## 2020-08-31 DIAGNOSIS — Z515 Encounter for palliative care: Secondary | ICD-10-CM

## 2020-08-31 NOTE — Progress Notes (Signed)
COMMUNITY PALLIATIVE CARE SW NOTE  PATIENT NAME: Carolyn Robertson DOB: [birth year] MRN: [id number]  PRIMARY CARE PROVIDER: Burnard Bunting, MD  RESPONSIBLE PARTY:  Acct ID - Guarantor Home Phone Work Phone Relationship Acct Type  [id number] [name]  Self P/F     [address], [place], [place] [address]  Due to the COVID-19, this visit was done via telephone from my office and it was initiated and consent by this patient.    PLAN OF CARE and INTERVENTIONS:             1. GOALS OF CARE/ ADVANCE CARE PLANNING: Goal is for patient to remain as independent as possible. Patient is a FULL CODE. 2. SOCIAL/EMOTIONAL/SPIRITUAL ASSESSMENT/ INTERVENTIONS:  SW completed a telephonic visit with patient and spoke directly to the patient. Patient reported that she will begin physical therapy with [facility], starting tomorrow 2x's week. She stated that her gait has been unsteady and she had a recent fall where she fell over the bath bench. She also reported that her anti-depressant was dropped from her pill pack and she has been without it for a few days. Her appetite and mood has been affected by not having this medication. Patient has also had a COVID booster that she feels she had a bad reaction to. Patient reported that her goal was to be able to get up to her walker. She continues to doe her own personal care. Although she has not had much of an appetite due to her not having her anti-depressant. She reported intermittent and generalized pain to her back. SW provided reassurance of support by the palliative care team.  SW encouraged her to call with any changes in condition or questions. SW advised that the palliative care team will continue monthly telephone calls to assess any needs and provide support. 3. PATIENT/CAREGIVER EDUCATION/ COPING:  Patient seems to be coping well. She report ongoing support from her husband and children.  4. PERSONAL EMERGENCY PLAN:  911 can accessed for support.   5. COMMUNITY RESOURCES COORDINATION/ HEALTH CARE NAVIGATION:  SW reinforced access and support of the palliative care team.  6. FINANCIAL/LEGAL CONCERNS/INTERVENTIONS:  None noted.     SOCIAL HX:  Social History   Tobacco Use  . Smoking status: Never Smoker  . Smokeless tobacco: Never Used  Substance Use Topics  . Alcohol use: No    Comment: rarely    CODE STATUS: Full Code ADVANCED DIRECTIVES: No MOST FORM COMPLETE:  No HOSPICE EDUCATION PROVIDED: No  PPS: Patient is alert and oriented x3. She remains independent for ADL's, but requires standby assistance. Patient has restarted physical therapy.   Duration of visit and documentation: 30 minutes      Katheren Puller, LCSW

## 2020-09-11 ENCOUNTER — Ambulatory Visit: Payer: Self-pay | Admitting: *Deleted

## 2020-09-14 ENCOUNTER — Other Ambulatory Visit: Payer: Self-pay | Admitting: *Deleted

## 2020-09-14 NOTE — Patient Outreach (Signed)
[place] [facility] [facility]) Care Management  09/14/2020  Carolyn Robertson September 10, 1946 [id number]   Telephone Assessment-case closure  Outreach #4 RN attempted outreach call however unsuccessful and unable to leave a HIPAA approved voice message. No response to the outreach letter sent last month.   Based upon no response to calls or outreach letter will close this case and notify the provider.  [name], RN Care Management Coordinator [place] Office [phone]

## 2020-09-15 DIAGNOSIS — E039 Hypothyroidism, unspecified: Secondary | ICD-10-CM | POA: Diagnosis not present

## 2020-09-15 DIAGNOSIS — I10 Essential (primary) hypertension: Secondary | ICD-10-CM | POA: Diagnosis not present

## 2020-09-15 DIAGNOSIS — E538 Deficiency of other specified B group vitamins: Secondary | ICD-10-CM | POA: Diagnosis not present

## 2020-09-21 DIAGNOSIS — R82998 Other abnormal findings in urine: Secondary | ICD-10-CM | POA: Diagnosis not present

## 2020-09-24 DIAGNOSIS — M170 Bilateral primary osteoarthritis of knee: Secondary | ICD-10-CM | POA: Diagnosis not present

## 2020-09-27 DIAGNOSIS — M1A30X0 Chronic gout due to renal impairment, unspecified site, without tophus (tophi): Secondary | ICD-10-CM | POA: Diagnosis not present

## 2020-09-27 DIAGNOSIS — Z79891 Long term (current) use of opiate analgesic: Secondary | ICD-10-CM | POA: Diagnosis not present

## 2020-09-27 DIAGNOSIS — N184 Chronic kidney disease, stage 4 (severe): Secondary | ICD-10-CM | POA: Diagnosis not present

## 2020-09-27 DIAGNOSIS — Z792 Long term (current) use of antibiotics: Secondary | ICD-10-CM | POA: Diagnosis not present

## 2020-09-27 DIAGNOSIS — Z87440 Personal history of urinary (tract) infections: Secondary | ICD-10-CM | POA: Diagnosis not present

## 2020-09-27 DIAGNOSIS — Z7982 Long term (current) use of aspirin: Secondary | ICD-10-CM | POA: Diagnosis not present

## 2020-09-27 DIAGNOSIS — G250 Essential tremor: Secondary | ICD-10-CM | POA: Diagnosis not present

## 2020-09-27 DIAGNOSIS — Z96659 Presence of unspecified artificial knee joint: Secondary | ICD-10-CM | POA: Diagnosis not present

## 2020-09-27 DIAGNOSIS — Z9181 History of falling: Secondary | ICD-10-CM | POA: Diagnosis not present

## 2020-09-27 DIAGNOSIS — Z7952 Long term (current) use of systemic steroids: Secondary | ICD-10-CM | POA: Diagnosis not present

## 2020-09-27 DIAGNOSIS — G8929 Other chronic pain: Secondary | ICD-10-CM | POA: Diagnosis not present

## 2020-09-27 DIAGNOSIS — J45909 Unspecified asthma, uncomplicated: Secondary | ICD-10-CM | POA: Diagnosis not present

## 2020-09-27 DIAGNOSIS — I129 Hypertensive chronic kidney disease with stage 1 through stage 4 chronic kidney disease, or unspecified chronic kidney disease: Secondary | ICD-10-CM | POA: Diagnosis not present

## 2020-09-27 DIAGNOSIS — D631 Anemia in chronic kidney disease: Secondary | ICD-10-CM | POA: Diagnosis not present

## 2020-09-27 DIAGNOSIS — Z8701 Personal history of pneumonia (recurrent): Secondary | ICD-10-CM | POA: Diagnosis not present

## 2020-09-27 DIAGNOSIS — F419 Anxiety disorder, unspecified: Secondary | ICD-10-CM | POA: Diagnosis not present

## 2020-09-27 DIAGNOSIS — Z8673 Personal history of transient ischemic attack (TIA), and cerebral infarction without residual deficits: Secondary | ICD-10-CM | POA: Diagnosis not present

## 2020-09-27 DIAGNOSIS — I872 Venous insufficiency (chronic) (peripheral): Secondary | ICD-10-CM | POA: Diagnosis not present

## 2020-09-27 DIAGNOSIS — E039 Hypothyroidism, unspecified: Secondary | ICD-10-CM | POA: Diagnosis not present

## 2020-09-27 DIAGNOSIS — K219 Gastro-esophageal reflux disease without esophagitis: Secondary | ICD-10-CM | POA: Diagnosis not present

## 2020-09-27 DIAGNOSIS — F32A Depression, unspecified: Secondary | ICD-10-CM | POA: Diagnosis not present

## 2020-09-27 DIAGNOSIS — G43909 Migraine, unspecified, not intractable, without status migrainosus: Secondary | ICD-10-CM | POA: Diagnosis not present

## 2020-09-27 DIAGNOSIS — E663 Overweight: Secondary | ICD-10-CM | POA: Diagnosis not present

## 2020-10-01 DIAGNOSIS — D631 Anemia in chronic kidney disease: Secondary | ICD-10-CM | POA: Diagnosis not present

## 2020-10-01 DIAGNOSIS — M1A30X0 Chronic gout due to renal impairment, unspecified site, without tophus (tophi): Secondary | ICD-10-CM | POA: Diagnosis not present

## 2020-10-01 DIAGNOSIS — G250 Essential tremor: Secondary | ICD-10-CM | POA: Diagnosis not present

## 2020-10-01 DIAGNOSIS — N184 Chronic kidney disease, stage 4 (severe): Secondary | ICD-10-CM | POA: Diagnosis not present

## 2020-10-01 DIAGNOSIS — I129 Hypertensive chronic kidney disease with stage 1 through stage 4 chronic kidney disease, or unspecified chronic kidney disease: Secondary | ICD-10-CM | POA: Diagnosis not present

## 2020-10-01 DIAGNOSIS — I872 Venous insufficiency (chronic) (peripheral): Secondary | ICD-10-CM | POA: Diagnosis not present

## 2020-10-02 DIAGNOSIS — D631 Anemia in chronic kidney disease: Secondary | ICD-10-CM | POA: Diagnosis not present

## 2020-10-02 DIAGNOSIS — I129 Hypertensive chronic kidney disease with stage 1 through stage 4 chronic kidney disease, or unspecified chronic kidney disease: Secondary | ICD-10-CM | POA: Diagnosis not present

## 2020-10-02 DIAGNOSIS — G250 Essential tremor: Secondary | ICD-10-CM | POA: Diagnosis not present

## 2020-10-02 DIAGNOSIS — I872 Venous insufficiency (chronic) (peripheral): Secondary | ICD-10-CM | POA: Diagnosis not present

## 2020-10-02 DIAGNOSIS — M1A30X0 Chronic gout due to renal impairment, unspecified site, without tophus (tophi): Secondary | ICD-10-CM | POA: Diagnosis not present

## 2020-10-02 DIAGNOSIS — N184 Chronic kidney disease, stage 4 (severe): Secondary | ICD-10-CM | POA: Diagnosis not present

## 2020-10-06 DIAGNOSIS — D631 Anemia in chronic kidney disease: Secondary | ICD-10-CM | POA: Diagnosis not present

## 2020-10-06 DIAGNOSIS — I129 Hypertensive chronic kidney disease with stage 1 through stage 4 chronic kidney disease, or unspecified chronic kidney disease: Secondary | ICD-10-CM | POA: Diagnosis not present

## 2020-10-06 DIAGNOSIS — N184 Chronic kidney disease, stage 4 (severe): Secondary | ICD-10-CM | POA: Diagnosis not present

## 2020-10-06 DIAGNOSIS — I872 Venous insufficiency (chronic) (peripheral): Secondary | ICD-10-CM | POA: Diagnosis not present

## 2020-10-06 DIAGNOSIS — M1A30X0 Chronic gout due to renal impairment, unspecified site, without tophus (tophi): Secondary | ICD-10-CM | POA: Diagnosis not present

## 2020-10-06 DIAGNOSIS — G250 Essential tremor: Secondary | ICD-10-CM | POA: Diagnosis not present

## 2020-10-08 DIAGNOSIS — N184 Chronic kidney disease, stage 4 (severe): Secondary | ICD-10-CM | POA: Diagnosis not present

## 2020-10-08 DIAGNOSIS — G250 Essential tremor: Secondary | ICD-10-CM | POA: Diagnosis not present

## 2020-10-08 DIAGNOSIS — D631 Anemia in chronic kidney disease: Secondary | ICD-10-CM | POA: Diagnosis not present

## 2020-10-08 DIAGNOSIS — M1A30X0 Chronic gout due to renal impairment, unspecified site, without tophus (tophi): Secondary | ICD-10-CM | POA: Diagnosis not present

## 2020-10-08 DIAGNOSIS — I129 Hypertensive chronic kidney disease with stage 1 through stage 4 chronic kidney disease, or unspecified chronic kidney disease: Secondary | ICD-10-CM | POA: Diagnosis not present

## 2020-10-08 DIAGNOSIS — I872 Venous insufficiency (chronic) (peripheral): Secondary | ICD-10-CM | POA: Diagnosis not present

## 2020-10-14 DIAGNOSIS — D631 Anemia in chronic kidney disease: Secondary | ICD-10-CM | POA: Diagnosis not present

## 2020-10-14 DIAGNOSIS — I129 Hypertensive chronic kidney disease with stage 1 through stage 4 chronic kidney disease, or unspecified chronic kidney disease: Secondary | ICD-10-CM | POA: Diagnosis not present

## 2020-10-14 DIAGNOSIS — I872 Venous insufficiency (chronic) (peripheral): Secondary | ICD-10-CM | POA: Diagnosis not present

## 2020-10-14 DIAGNOSIS — G250 Essential tremor: Secondary | ICD-10-CM | POA: Diagnosis not present

## 2020-10-14 DIAGNOSIS — N184 Chronic kidney disease, stage 4 (severe): Secondary | ICD-10-CM | POA: Diagnosis not present

## 2020-10-14 DIAGNOSIS — M1A30X0 Chronic gout due to renal impairment, unspecified site, without tophus (tophi): Secondary | ICD-10-CM | POA: Diagnosis not present

## 2020-10-19 ENCOUNTER — Telehealth: Payer: Self-pay

## 2020-10-19 ENCOUNTER — Other Ambulatory Visit: Payer: Self-pay

## 2020-10-19 ENCOUNTER — Other Ambulatory Visit: Payer: Medicare Other

## 2020-10-19 DIAGNOSIS — Z515 Encounter for palliative care: Secondary | ICD-10-CM

## 2020-10-19 NOTE — Telephone Encounter (Signed)
Phone call placed to patient's husband on home phone with no answer. VM left with call back information

## 2020-10-19 NOTE — Telephone Encounter (Signed)
Phone call placed to patient's husband on his cell phone. No answer and no VM

## 2020-10-19 NOTE — Telephone Encounter (Signed)
Received phone call from [place], [place] with Palliative care, to report that patient's husband shared that patient had a fall on 10/09/20. Patient's husband has been providing wound care to laceration on her arm. Patient had not been evaluated for further injury and unsure if patient needs x ray. Husband shared that [facility] PT is scheduled to come either today or tomorrow. This RN spoke with Cindie at [place] who updated PT. Per Cindie, PT had made a visit to patient on 10/14/20 but was not made aware that patient had a fall. PT to visit tomorrow. Palliative care SW updated

## 2020-10-19 NOTE — Progress Notes (Signed)
COMMUNITY PALLIATIVE CARE SW NOTE  PATIENT NAME: Carolyn Robertson DOB: January 23, 1946 MRN: [id number]  PRIMARY CARE PROVIDER: Burnard Bunting, MD  RESPONSIBLE PARTY:  Acct ID - Guarantor Home Phone Work Phone Relationship Acct Type  [id number] Carolyn [phone]  Self P/F     [address], [place], [place] [address]   Due to the COVID-19, this visit was done via telephone from [facility] it was initiated and consent by this patient.    PLAN OF CARE and INTERVENTIONS:             1. GOALS OF CARE/ ADVANCE CARE PLANNING: Goal is for patient to remain home with her husband.  2. SOCIAL/EMOTIONAL/SPIRITUAL ASSESSMENT/ INTERVENTIONS:  SW completed a telephonic visit with her husband.He advised that "patient isn't doing to good". He advised that patient had a fall on Christmas Eve and busted her hand. He feels that patient may have had a series of mini strokes, because she does not want to get up and the left side of her face is drooping. SW asked if patient went to the emergency department. He stated no, because she didn't want to go and he could not get him up. Her appetite varies, but has decreased overall. Her husband advised that he has has been dressing patient's wound on her hand and leg with peroxide and no-stick bandages 2x's/day. He advised that he is expecting a visit from the physical therapist with [facility] Lemmings this week, where patient is expected to be discharged. He stated he will ask the physical therapist to "look patient over'. SW offered to have the palliative care nurse to follow-up with him tomorrow to schedule a visit. He initially declined, but later stated "yeah, have her call me". SW advised him that the RN will follow-up and encouraged him to call for any additional support or questions. SW will update palliative care RN for follow-up. SW consulted with the RN Clinical Navigator-B. Senor to discuss recent report from patient's husband regarding fall and noted decline and she  will follow-up husband to assess any needs and offer support.  1. PATIENT/CAREGIVER EDUCATION/ COPING:  Patient seems to be coping well. She report ongoing support from her husband and children.  3.  4. PERSONAL EMERGENCY PLAN:  911 can be activated for emergencies.  5. COMMUNITY RESOURCES COORDINATION/ HEALTH CARE NAVIGATION:  Patient is receiving physical therapy and is scheduled to be discharged after next visit.  6. FINANCIAL/LEGAL CONCERNS/INTERVENTIONS:  None.      SOCIAL HX:  Social History   Tobacco Use  . Smoking status: Never Smoker  . Smokeless tobacco: Never Used  Substance Use Topics  . Alcohol use: No    Comment: rarely    CODE STATUS: FULL Code ADVANCED DIRECTIVES: No MOST FORM COMPLETE:Yes HOSPICE EDUCATION PROVIDED:  No  PPS: Patient is alert and oriented to self and situation. Patient had a recent fall and has declined since that time.   Duration of telephonic visit and documentation: 30 minutes       Katheren Puller, LCSW

## 2020-10-20 DIAGNOSIS — N184 Chronic kidney disease, stage 4 (severe): Secondary | ICD-10-CM | POA: Diagnosis not present

## 2020-10-20 DIAGNOSIS — G250 Essential tremor: Secondary | ICD-10-CM | POA: Diagnosis not present

## 2020-10-20 DIAGNOSIS — I872 Venous insufficiency (chronic) (peripheral): Secondary | ICD-10-CM | POA: Diagnosis not present

## 2020-10-20 DIAGNOSIS — I129 Hypertensive chronic kidney disease with stage 1 through stage 4 chronic kidney disease, or unspecified chronic kidney disease: Secondary | ICD-10-CM | POA: Diagnosis not present

## 2020-10-20 DIAGNOSIS — D631 Anemia in chronic kidney disease: Secondary | ICD-10-CM | POA: Diagnosis not present

## 2020-10-20 DIAGNOSIS — M1A30X0 Chronic gout due to renal impairment, unspecified site, without tophus (tophi): Secondary | ICD-10-CM | POA: Diagnosis not present

## 2020-11-05 ENCOUNTER — Encounter (HOSPITAL_COMMUNITY): Payer: Self-pay

## 2020-11-05 ENCOUNTER — Emergency Department (HOSPITAL_COMMUNITY): Payer: Medicare Other

## 2020-11-05 ENCOUNTER — Other Ambulatory Visit: Payer: Self-pay

## 2020-11-05 ENCOUNTER — Inpatient Hospital Stay (HOSPITAL_COMMUNITY)
Admission: EM | Admit: 2020-11-05 | Discharge: 2020-11-15 | DRG: 871 | Disposition: A | Discharge status: Skilled Nursing Facility | Payer: Medicare Other | Attending: Internal Medicine | Admitting: Internal Medicine

## 2020-11-05 DIAGNOSIS — Z87442 Personal history of urinary calculi: Secondary | ICD-10-CM | POA: Diagnosis not present

## 2020-11-05 DIAGNOSIS — F32A Depression, unspecified: Secondary | ICD-10-CM | POA: Diagnosis present

## 2020-11-05 DIAGNOSIS — N189 Chronic kidney disease, unspecified: Secondary | ICD-10-CM | POA: Diagnosis not present

## 2020-11-05 DIAGNOSIS — F418 Other specified anxiety disorders: Secondary | ICD-10-CM | POA: Diagnosis not present

## 2020-11-05 DIAGNOSIS — J45909 Unspecified asthma, uncomplicated: Secondary | ICD-10-CM | POA: Diagnosis present

## 2020-11-05 DIAGNOSIS — M21921 Unspecified acquired deformity of right upper arm: Secondary | ICD-10-CM | POA: Diagnosis not present

## 2020-11-05 DIAGNOSIS — S60511D Abrasion of right hand, subsequent encounter: Secondary | ICD-10-CM

## 2020-11-05 DIAGNOSIS — A4150 Gram-negative sepsis, unspecified: Secondary | ICD-10-CM | POA: Diagnosis not present

## 2020-11-05 DIAGNOSIS — R1312 Dysphagia, oropharyngeal phase: Secondary | ICD-10-CM | POA: Diagnosis not present

## 2020-11-05 DIAGNOSIS — M109 Gout, unspecified: Secondary | ICD-10-CM | POA: Diagnosis present

## 2020-11-05 DIAGNOSIS — I10 Essential (primary) hypertension: Secondary | ICD-10-CM | POA: Diagnosis not present

## 2020-11-05 DIAGNOSIS — S8011XD Contusion of right lower leg, subsequent encounter: Secondary | ICD-10-CM

## 2020-11-05 DIAGNOSIS — N184 Chronic kidney disease, stage 4 (severe): Secondary | ICD-10-CM | POA: Diagnosis present

## 2020-11-05 DIAGNOSIS — E039 Hypothyroidism, unspecified: Secondary | ICD-10-CM | POA: Diagnosis present

## 2020-11-05 DIAGNOSIS — R278 Other lack of coordination: Secondary | ICD-10-CM | POA: Diagnosis not present

## 2020-11-05 DIAGNOSIS — E871 Hypo-osmolality and hyponatremia: Secondary | ICD-10-CM | POA: Diagnosis present

## 2020-11-05 DIAGNOSIS — I82511 Chronic embolism and thrombosis of right femoral vein: Secondary | ICD-10-CM | POA: Diagnosis present

## 2020-11-05 DIAGNOSIS — I82411 Acute embolism and thrombosis of right femoral vein: Secondary | ICD-10-CM | POA: Diagnosis not present

## 2020-11-05 DIAGNOSIS — R6251 Failure to thrive (child): Secondary | ICD-10-CM

## 2020-11-05 DIAGNOSIS — A4159 Other Gram-negative sepsis: Principal | ICD-10-CM | POA: Diagnosis present

## 2020-11-05 DIAGNOSIS — N179 Acute kidney failure, unspecified: Secondary | ICD-10-CM | POA: Diagnosis present

## 2020-11-05 DIAGNOSIS — Z880 Allergy status to penicillin: Secondary | ICD-10-CM

## 2020-11-05 DIAGNOSIS — R627 Adult failure to thrive: Secondary | ICD-10-CM | POA: Diagnosis present

## 2020-11-05 DIAGNOSIS — Z882 Allergy status to sulfonamides status: Secondary | ICD-10-CM

## 2020-11-05 DIAGNOSIS — Z8673 Personal history of transient ischemic attack (TIA), and cerebral infarction without residual deficits: Secondary | ICD-10-CM | POA: Diagnosis not present

## 2020-11-05 DIAGNOSIS — Z79890 Hormone replacement therapy (postmenopausal): Secondary | ICD-10-CM

## 2020-11-05 DIAGNOSIS — F419 Anxiety disorder, unspecified: Secondary | ICD-10-CM | POA: Diagnosis present

## 2020-11-05 DIAGNOSIS — N959 Unspecified menopausal and perimenopausal disorder: Secondary | ICD-10-CM | POA: Diagnosis not present

## 2020-11-05 DIAGNOSIS — R2681 Unsteadiness on feet: Secondary | ICD-10-CM | POA: Diagnosis not present

## 2020-11-05 DIAGNOSIS — U071 COVID-19: Secondary | ICD-10-CM | POA: Diagnosis present

## 2020-11-05 DIAGNOSIS — S8012XD Contusion of left lower leg, subsequent encounter: Secondary | ICD-10-CM

## 2020-11-05 DIAGNOSIS — Z885 Allergy status to narcotic agent status: Secondary | ICD-10-CM | POA: Diagnosis not present

## 2020-11-05 DIAGNOSIS — K219 Gastro-esophageal reflux disease without esophagitis: Secondary | ICD-10-CM | POA: Diagnosis present

## 2020-11-05 DIAGNOSIS — Z91048 Other nonmedicinal substance allergy status: Secondary | ICD-10-CM

## 2020-11-05 DIAGNOSIS — R296 Repeated falls: Secondary | ICD-10-CM | POA: Diagnosis present

## 2020-11-05 DIAGNOSIS — M6281 Muscle weakness (generalized): Secondary | ICD-10-CM | POA: Diagnosis not present

## 2020-11-05 DIAGNOSIS — S40021D Contusion of right upper arm, subsequent encounter: Secondary | ICD-10-CM

## 2020-11-05 DIAGNOSIS — R2689 Other abnormalities of gait and mobility: Secondary | ICD-10-CM | POA: Diagnosis not present

## 2020-11-05 DIAGNOSIS — Z6821 Body mass index (BMI) 21.0-21.9, adult: Secondary | ICD-10-CM

## 2020-11-05 DIAGNOSIS — M6259 Muscle wasting and atrophy, not elsewhere classified, multiple sites: Secondary | ICD-10-CM | POA: Diagnosis not present

## 2020-11-05 DIAGNOSIS — N390 Urinary tract infection, site not specified: Secondary | ICD-10-CM | POA: Diagnosis present

## 2020-11-05 DIAGNOSIS — S40022D Contusion of left upper arm, subsequent encounter: Secondary | ICD-10-CM

## 2020-11-05 DIAGNOSIS — Z886 Allergy status to analgesic agent status: Secondary | ICD-10-CM | POA: Diagnosis not present

## 2020-11-05 DIAGNOSIS — Z7982 Long term (current) use of aspirin: Secondary | ICD-10-CM

## 2020-11-05 DIAGNOSIS — M1990 Unspecified osteoarthritis, unspecified site: Secondary | ICD-10-CM | POA: Diagnosis not present

## 2020-11-05 DIAGNOSIS — E876 Hypokalemia: Secondary | ICD-10-CM | POA: Diagnosis not present

## 2020-11-05 DIAGNOSIS — N3000 Acute cystitis without hematuria: Secondary | ICD-10-CM | POA: Diagnosis not present

## 2020-11-05 DIAGNOSIS — R5381 Other malaise: Secondary | ICD-10-CM

## 2020-11-05 DIAGNOSIS — I129 Hypertensive chronic kidney disease with stage 1 through stage 4 chronic kidney disease, or unspecified chronic kidney disease: Secondary | ICD-10-CM | POA: Diagnosis present

## 2020-11-05 DIAGNOSIS — B964 Proteus (mirabilis) (morganii) as the cause of diseases classified elsewhere: Secondary | ICD-10-CM | POA: Diagnosis present

## 2020-11-05 DIAGNOSIS — R7989 Other specified abnormal findings of blood chemistry: Secondary | ICD-10-CM | POA: Diagnosis not present

## 2020-11-05 DIAGNOSIS — W19XXXD Unspecified fall, subsequent encounter: Secondary | ICD-10-CM | POA: Diagnosis present

## 2020-11-05 DIAGNOSIS — G894 Chronic pain syndrome: Secondary | ICD-10-CM | POA: Diagnosis not present

## 2020-11-05 DIAGNOSIS — S80811D Abrasion, right lower leg, subsequent encounter: Secondary | ICD-10-CM

## 2020-11-05 DIAGNOSIS — G9009 Other idiopathic peripheral autonomic neuropathy: Secondary | ICD-10-CM | POA: Diagnosis not present

## 2020-11-05 DIAGNOSIS — Z79899 Other long term (current) drug therapy: Secondary | ICD-10-CM

## 2020-11-05 DIAGNOSIS — Z7401 Bed confinement status: Secondary | ICD-10-CM | POA: Diagnosis not present

## 2020-11-05 DIAGNOSIS — R41841 Cognitive communication deficit: Secondary | ICD-10-CM | POA: Diagnosis not present

## 2020-11-05 DIAGNOSIS — R531 Weakness: Secondary | ICD-10-CM | POA: Diagnosis not present

## 2020-11-05 DIAGNOSIS — M1711 Unilateral primary osteoarthritis, right knee: Secondary | ICD-10-CM | POA: Diagnosis not present

## 2020-11-05 DIAGNOSIS — Z823 Family history of stroke: Secondary | ICD-10-CM

## 2020-11-05 DIAGNOSIS — M2550 Pain in unspecified joint: Secondary | ICD-10-CM | POA: Diagnosis not present

## 2020-11-05 LAB — URINALYSIS, ROUTINE W REFLEX MICROSCOPIC
Bilirubin Urine: NEGATIVE
Glucose, UA: NEGATIVE mg/dL
Ketones, ur: NEGATIVE mg/dL
Nitrite: NEGATIVE
Protein, ur: 100 mg/dL — AB
RBC / HPF: >50 RBC/hpf — ABNORMAL HIGH (ref 0–5)
Specific Gravity, Urine: 1.016 (ref 1.005–1.030)
Squamous Epithelial / HPF: >50 — ABNORMAL HIGH (ref 0–5)
WBC, UA: >50 WBC/hpf — ABNORMAL HIGH (ref 0–5)
pH: 5 (ref 5.0–8.0)

## 2020-11-05 LAB — CBC WITH DIFFERENTIAL/PLATELET
Abs Immature Granulocytes: 0.27 10*3/uL — ABNORMAL HIGH (ref 0.00–0.07)
Basophils Absolute: 0.1 10*3/uL (ref 0.0–0.1)
Basophils Relative: 1 %
Eosinophils Absolute: 0.2 10*3/uL (ref 0.0–0.5)
Eosinophils Relative: 1 %
HCT: 41.6 % (ref 36.0–46.0)
Hemoglobin: 13.1 g/dL (ref 12.0–15.0)
Immature Granulocytes: 2 %
Lymphocytes Relative: 6 %
Lymphs Abs: 0.8 10*3/uL (ref 0.7–4.0)
MCH: 26.8 pg (ref 26.0–34.0)
MCHC: 31.5 g/dL (ref 30.0–36.0)
MCV: 85.2 fL (ref 80.0–100.0)
Monocytes Absolute: 0.9 10*3/uL (ref 0.1–1.0)
Monocytes Relative: 6 %
Neutro Abs: 12.8 10*3/uL — ABNORMAL HIGH (ref 1.7–7.7)
Neutrophils Relative %: 84 %
Platelets: 422 10*3/uL — ABNORMAL HIGH (ref 150–400)
RBC: 4.88 MIL/uL (ref 3.87–5.11)
RDW: 18.4 % — ABNORMAL HIGH (ref 11.5–15.5)
WBC: 15 10*3/uL — ABNORMAL HIGH (ref 4.0–10.5)
nRBC: 0.1 % (ref 0.0–0.2)

## 2020-11-05 LAB — SARS CORONAVIRUS 2 (TAT 6-24 HRS): SARS Coronavirus 2: POSITIVE — AB

## 2020-11-05 LAB — COMPREHENSIVE METABOLIC PANEL
ALT: 17 U/L (ref 0–44)
AST: 32 U/L (ref 15–41)
Albumin: 2.9 g/dL — ABNORMAL LOW (ref 3.5–5.0)
Alkaline Phosphatase: 83 U/L (ref 38–126)
Anion gap: 13 (ref 5–15)
BUN: 24 mg/dL — ABNORMAL HIGH (ref 8–23)
CO2: 21 mmol/L — ABNORMAL LOW (ref 22–32)
Calcium: 9.1 mg/dL (ref 8.9–10.3)
Chloride: 100 mmol/L (ref 98–111)
Creatinine, Ser: 2.51 mg/dL — ABNORMAL HIGH (ref 0.44–1.00)
GFR, Estimated: 19 mL/min — ABNORMAL LOW (ref >60)
Glucose, Bld: 105 mg/dL — ABNORMAL HIGH (ref 70–99)
Potassium: 3.7 mmol/L (ref 3.5–5.1)
Sodium: 134 mmol/L — ABNORMAL LOW (ref 135–145)
Total Bilirubin: 0.6 mg/dL (ref 0.3–1.2)
Total Protein: 5.3 g/dL — ABNORMAL LOW (ref 6.5–8.1)

## 2020-11-05 LAB — MAGNESIUM: Magnesium: 1.8 mg/dL (ref 1.7–2.4)

## 2020-11-05 MED ORDER — CEFTRIAXONE SODIUM 1 G IJ SOLR
1.0000 g | INTRAMUSCULAR | Status: AC
  Administered 2020-11-06 – 2020-11-10 (×5): 1 g via INTRAVENOUS
  Filled 2020-11-05 (×5): qty 10

## 2020-11-05 MED ORDER — SODIUM CHLORIDE 0.9 % IV BOLUS
1000.0000 mL | Freq: Once | INTRAVENOUS | Status: AC
  Administered 2020-11-05: 1000 mL via INTRAVENOUS

## 2020-11-05 MED ORDER — TRAZODONE HCL 50 MG PO TABS
25.0000 mg | ORAL_TABLET | Freq: Every evening | ORAL | Status: DC | PRN
  Administered 2020-11-08 – 2020-11-10 (×2): 25 mg via ORAL
  Filled 2020-11-05 (×2): qty 1

## 2020-11-05 MED ORDER — CYANOCOBALAMIN 1000 MCG/ML IJ SOLN: 1000.0000 ug | INTRAMUSCULAR | Status: DC

## 2020-11-05 MED ORDER — BACLOFEN 10 MG PO TABS
10.0000 mg | ORAL_TABLET | Freq: Two times a day (BID) | ORAL | Status: DC | PRN
  Filled 2020-11-05: qty 1

## 2020-11-05 MED ORDER — ACETAMINOPHEN 325 MG PO TABS
650.0000 mg | ORAL_TABLET | Freq: Four times a day (QID) | ORAL | Status: DC | PRN
  Administered 2020-11-13: 650 mg via ORAL
  Filled 2020-11-05: qty 2

## 2020-11-05 MED ORDER — ACETAMINOPHEN 650 MG RE SUPP: 650.0000 mg | Freq: Four times a day (QID) | RECTAL | Status: DC | PRN

## 2020-11-05 MED ORDER — LEVOTHYROXINE SODIUM 75 MCG PO TABS
175.0000 ug | ORAL_TABLET | Freq: Every day | ORAL | Status: DC
  Administered 2020-11-06 – 2020-11-10 (×5): 175 ug via ORAL
  Filled 2020-11-05 (×5): qty 1

## 2020-11-05 MED ORDER — ASPIRIN EC 81 MG PO TBEC
81.0000 mg | DELAYED_RELEASE_TABLET | Freq: Every day | ORAL | Status: DC
  Administered 2020-11-06 – 2020-11-12 (×7): 81 mg via ORAL
  Filled 2020-11-05 (×7): qty 1

## 2020-11-05 MED ORDER — GABAPENTIN 100 MG PO CAPS
100.0000 mg | ORAL_CAPSULE | Freq: Every day | ORAL | Status: DC
  Administered 2020-11-06 – 2020-11-15 (×10): 100 mg via ORAL
  Filled 2020-11-05 (×10): qty 1

## 2020-11-05 MED ORDER — SODIUM CHLORIDE 0.9 % IV SOLN
200.0000 mg | Freq: Once | INTRAVENOUS | Status: AC
  Administered 2020-11-06: 200 mg via INTRAVENOUS
  Filled 2020-11-05: qty 40

## 2020-11-05 MED ORDER — SODIUM CHLORIDE 0.9 % IV SOLN
1.0000 g | Freq: Once | INTRAVENOUS | Status: AC
  Administered 2020-11-05: 1 g via INTRAVENOUS
  Filled 2020-11-05: qty 10

## 2020-11-05 MED ORDER — MAGNESIUM HYDROXIDE 400 MG/5ML PO SUSP
30.0000 mL | Freq: Every day | ORAL | Status: DC | PRN
  Filled 2020-11-05: qty 30

## 2020-11-05 MED ORDER — FUROSEMIDE 40 MG PO TABS: 40.0000 mg | ORAL_TABLET | Freq: Every day | ORAL | Status: DC | PRN

## 2020-11-05 MED ORDER — FAMOTIDINE 20 MG PO TABS
20.0000 mg | ORAL_TABLET | Freq: Two times a day (BID) | ORAL | Status: DC
  Administered 2020-11-05 – 2020-11-06 (×2): 20 mg via ORAL
  Filled 2020-11-05 (×2): qty 1

## 2020-11-05 MED ORDER — FEBUXOSTAT 40 MG PO TABS
40.0000 mg | ORAL_TABLET | Freq: Every day | ORAL | Status: DC
  Administered 2020-11-05 – 2020-11-14 (×10): 40 mg via ORAL
  Filled 2020-11-05 (×11): qty 1

## 2020-11-05 MED ORDER — ONDANSETRON HCL 4 MG PO TABS: 4.0000 mg | ORAL_TABLET | Freq: Four times a day (QID) | ORAL | Status: DC | PRN

## 2020-11-05 MED ORDER — PANTOPRAZOLE SODIUM 40 MG PO TBEC
40.0000 mg | DELAYED_RELEASE_TABLET | Freq: Every day | ORAL | Status: DC
Start: 2020-11-06 — End: 2020-11-15
  Administered 2020-11-06 – 2020-11-15 (×9): 40 mg via ORAL
  Filled 2020-11-05 (×9): qty 1

## 2020-11-05 MED ORDER — SODIUM CHLORIDE 0.9 % IV SOLN
100.0000 mg | Freq: Every day | INTRAVENOUS | Status: AC
  Administered 2020-11-07 – 2020-11-08 (×2): 100 mg via INTRAVENOUS
  Filled 2020-11-05 (×2): qty 20

## 2020-11-05 MED ORDER — ONDANSETRON HCL 4 MG/2ML IJ SOLN: 4.0000 mg | Freq: Four times a day (QID) | INTRAMUSCULAR | Status: DC | PRN

## 2020-11-05 MED ORDER — HYDROCODONE-ACETAMINOPHEN 5-325 MG PO TABS
1.0000 | ORAL_TABLET | Freq: Four times a day (QID) | ORAL | Status: DC | PRN
  Administered 2020-11-06 – 2020-11-14 (×11): 1 via ORAL
  Filled 2020-11-05 (×11): qty 1

## 2020-11-05 MED ORDER — DULOXETINE HCL 60 MG PO CPEP
60.0000 mg | ORAL_CAPSULE | Freq: Every day | ORAL | Status: DC
  Administered 2020-11-05 – 2020-11-14 (×10): 60 mg via ORAL
  Filled 2020-11-05 (×10): qty 1

## 2020-11-05 MED ORDER — ENOXAPARIN SODIUM 30 MG/0.3ML ~~LOC~~ SOLN
30.0000 mg | SUBCUTANEOUS | Status: DC
  Administered 2020-11-06 – 2020-11-12 (×7): 30 mg via SUBCUTANEOUS
  Filled 2020-11-05 (×8): qty 0.3

## 2020-11-05 MED ORDER — VITAMIN D 25 MCG (1000 UNIT) PO TABS
1000.0000 [IU] | ORAL_TABLET | Freq: Every day | ORAL | Status: DC
  Administered 2020-11-06 – 2020-11-15 (×10): 1000 [IU] via ORAL
  Filled 2020-11-05 (×10): qty 1

## 2020-11-05 MED ORDER — ASCORBIC ACID 500 MG PO TABS
500.0000 mg | ORAL_TABLET | Freq: Every day | ORAL | Status: DC
  Administered 2020-11-06 – 2020-11-15 (×10): 500 mg via ORAL
  Filled 2020-11-05 (×10): qty 1

## 2020-11-05 MED ORDER — CONJ ESTROG-MEDROXYPROGEST ACE 0.625-2.5 MG PO TABS
1.0000 | ORAL_TABLET | Freq: Every day | ORAL | Status: DC
  Administered 2020-11-07 – 2020-11-11 (×5): 1 via ORAL

## 2020-11-05 MED ORDER — ZINC SULFATE 220 (50 ZN) MG PO CAPS
220.0000 mg | ORAL_CAPSULE | Freq: Every day | ORAL | Status: DC
  Administered 2020-11-06 – 2020-11-15 (×10): 220 mg via ORAL
  Filled 2020-11-05 (×10): qty 1

## 2020-11-05 MED ORDER — SODIUM CHLORIDE 0.9 % IV SOLN: INTRAVENOUS | Status: AC

## 2020-11-05 NOTE — ED Provider Notes (Signed)
[facility] EMERGENCY DEPARTMENT Provider Note  CSN: [id number] Arrival date & time: 11/05/20 1637    History Chief Complaint  Patient presents with  . Weakness  . Failure To Thrive    HPI  Carolyn Robertson is a 75 y.o. female brought to the ED from home for evaluation of generalized weakness, decreased appetite for several weeks. Seems to have begun after a fall at home around 12/26 in which she sustained an abrasion/skin tear to R wrist that her husband has been treating at home. She has not been seen by a doctor since then though. She called her PCP to try to get placed into [facility] for short term rehab but was directed to the ED instead.    Past Medical History:  Diagnosis Date  . Anxiety   . Arthritis    RA  . 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 Depression   . GERD (gastroesophageal reflux disease)   . History of blood transfusion    after miscarrige  . History of kidney stones    x 2  . Hypertension   . Hypothyroidism   . Incontinent of urine    incontinent at night  . Kidney failure   . Memory loss    "after TIA"  . Pneumonia    as a child and teenager  . Renal disorder    stage IV kidney disease  . Stroke ([place])   . Thyroid disease   . TIA (transient ischemic attack) 06/2018    Past Surgical History:  Procedure Laterality Date  . CHOLECYSTECTOMY    . DILATION AND CURETTAGE OF UTERUS    . EYE SURGERY Right    retina surgery  . KNEE SURGERY     bilat knees  . LUMBAR LAMINECTOMY/DECOMPRESSION MICRODISCECTOMY Right 04/12/2018   Procedure: MICRODISCECTOMY LUMBAR FOUR- LUMBAR FIVE;  Surgeon: Newman Pies, MD;  Location: [facility];  Service: Neurosurgery;  Laterality: Right;  MICRODISCECTOMY LUMBAR FOUR- LUMBAR FIVE  . TONSILLECTOMY      Family History  Problem Relation Age of Onset  . Atrial fibrillation Mother   . Stroke Father     Social History   Tobacco Use  . Smoking status: Never Smoker  . Smokeless tobacco:  Never Used  Vaping Use  . Vaping Use: Never used  Substance Use Topics  . Alcohol use: No    Comment: rarely  . Drug use: No     Home Medications Prior to Admission medications   Medication Sig Start Date End Date Taking? Authorizing Provider  aspirin 81 MG EC tablet Take 1 tablet (81 mg total) by mouth daily. 06/27/18   Kinnie Feil, MD  baclofen (LIORESAL) 10 MG tablet Take 10 mg by mouth 2 (two) times daily as needed for muscle spasms. 02/12/20   [provider]  cyanocobalamin (,VITAMIN B-12,) 1000 MCG/ML injection Inject 1 mL into the skin every 28 (twenty-eight) days. 05/08/19   [provider]  DULoxetine (CYMBALTA) 60 MG capsule Take 60 mg by mouth at bedtime.    [provider]  estrogen, conjugated,-medroxyprogesterone (PREMPRO) 0.625-2.5 MG per tablet Take 1 tablet by mouth daily.    [provider]  Febuxostat 80 MG TABS Take 40 mg by mouth at bedtime.     [provider]  furosemide (LASIX) 40 MG tablet Take 40 mg by mouth daily as needed for edema.     [provider]  gabapentin (NEURONTIN) 100 MG capsule Take 100  mg by mouth daily.    [provider]  HYDROcodone-acetaminophen (NORCO/VICODIN) 5-325 MG tablet Take 1 tablet by mouth every 6 (six) hours as needed. 02/18/20   Patrecia Pour, MD  levothyroxine (SYNTHROID) 200 MCG tablet Take 200 mcg by mouth daily. Take 252mcg by mouth Patient not taking: Reported on 07/02/2020 05/28/19   [provider]  nystatin cream (MYCOSTATIN) Apply to affected area 2 times daily 04/12/19   Antonietta Breach, PA-C  pantoprazole (PROTONIX) 40 MG tablet Take 40 mg by mouth daily before breakfast. 02/22/18   [provider]  SYNTHROID 175 MCG tablet Take 175 mcg by mouth daily before breakfast. Take 170mcg by mouth on Tuesdays, Thursdays, Saturdays and Sundays 01/05/18   [provider]  trimethoprim (TRIMPEX) 100 MG tablet Take 100 mg by mouth daily. 07/17/19    [provider]     Allergies    Other, Penicillins, Sulfa antibiotics, Codeine, Hydrocodone-acetaminophen, Naproxen sodium, and Tape   Review of Systems   Review of Systems A comprehensive review of systems was completed and negative except as noted in HPI.    Physical Exam BP 138/82   Pulse 99   Temp 98.2 F (36.8 C)   Resp (!) 21   Ht 5\' 4"  (1.626 m)   Wt 57.6 kg   LMP  (LMP Unknown)   SpO2 94%   BMI 21.80 kg/m   Physical Exam Vitals and nursing note reviewed.  Constitutional:      Appearance: Normal appearance.  HENT:     Head: Normocephalic and atraumatic.     Nose: Nose normal.     Mouth/Throat:     Mouth: Mucous membranes are moist.  Eyes:     Extraocular Movements: Extraocular movements intact.     Conjunctiva/sclera: Conjunctivae normal.  Cardiovascular:     Rate and Rhythm: Normal rate.  Pulmonary:     Effort: Pulmonary effort is normal.     Breath sounds: Normal breath sounds.  Abdominal:     General: Abdomen is flat.     Palpations: Abdomen is soft.     Tenderness: There is no abdominal tenderness.  Musculoskeletal:        General: No swelling. Normal range of motion.     Cervical back: Neck supple.  Skin:    General: Skin is warm and dry.     Comments: Healing abrasions to R hand and R leg, healing bruises to numerous other areas on arms and legs  Neurological:     General: No focal deficit present.     Mental Status: She is alert.  Psychiatric:        Mood and Affect: Mood normal.      ED Results / Procedures / Treatments   Labs (all labs ordered are listed, but only abnormal results are displayed) Labs Reviewed  COMPREHENSIVE METABOLIC PANEL - Abnormal; Notable for the following components:      Result Value   Sodium 134 (*)    CO2 21 (*)    Glucose, Bld 105 (*)    BUN 24 (*)    Creatinine, Ser 2.51 (*)    Total Protein 5.3 (*)    Albumin 2.9 (*)    GFR, Estimated 19 (*)    All other components within normal limits   CBC WITH DIFFERENTIAL/PLATELET - Abnormal; Notable for the following components:   WBC 15.0 (*)    RDW 18.4 (*)    Platelets 422 (*)    Neutro Abs 12.8 (*)  Abs Immature Granulocytes 0.27 (*)    All other components within normal limits  URINALYSIS, ROUTINE W REFLEX MICROSCOPIC - Abnormal; Notable for the following components:   Color, Urine AMBER (*)    APPearance TURBID (*)    Hgb urine dipstick MODERATE (*)    Protein, ur 100 (*)    Leukocytes,Ua LARGE (*)    RBC / HPF >50 (*)    WBC, UA >50 (*)    Bacteria, UA MANY (*)    Squamous Epithelial / LPF >50 (*)    Non Squamous Epithelial 0-5 (*)    All other components within normal limits  SARS CORONAVIRUS 2 (TAT 6-24 HRS)  MAGNESIUM    EKG EKG Interpretation  Date/Time:  Thursday November 05 2020 16:46:06 EST Ventricular Rate:  96 PR Interval:    QRS Duration: 96 QT Interval:  353 QTC Calculation: 418 R Axis:     Text Interpretation: Sinus rhythm Atrial premature complexes Abnormal R-wave progression, early transition LVH with secondary repolarization abnormality No significant change since last tracing Confirmed by Calvert Cantor [phone]) on 11/05/2020 4:50:07 PM   Radiology DG Chest Port 1 View  Result Date: 11/05/2020 CLINICAL DATA:  General weakness EXAM: PORTABLE CHEST 1 VIEW COMPARISON:  None. FINDINGS: Mildly diminished lung volumes. No consolidation or effusion. Normal cardiomediastinal silhouette. No pneumothorax. Opacity over the right lower lung felt secondary to healing right seventh posterior rib fracture. IMPRESSION: No active disease. Probable healing right seventh posterior rib fracture. Electronically Signed   By: Donavan Foil M.D.   On: 11/05/2020 17:58    Procedures Procedures  Medications Ordered in the ED Medications  cefTRIAXone (ROCEPHIN) 1 g in sodium chloride 0.9 % 100 mL IVPB (1 g Intravenous New Bag/Given 11/05/20 2147)  sodium chloride 0.9 % bolus 1,000 mL (0 mLs Intravenous Stopped  11/05/20 2102)     MDM Rules/Calculators/A&P MDM Patient with general weakness, failure to thrive. No other specific symptoms. Will check labs, give IVF and reassess.   ED Course  I have reviewed the triage vital signs and the nursing notes.  Pertinent labs & imaging results that were available during my care of the patient were reviewed by me and considered in my medical decision making (see chart for details).  Clinical Course as of 11/05/20 2156  Thu Nov 05, 2020  1839 CMP with worsening of CKD. CBC has a mild leukocytosis of unclear etiology. Awaiting UA.  [CS]  2135 UA is consistent with a UTI. Will give a dose of rocephin, send for culture. Patient had similar presentation in May 2021 and after a brief hospital stay went to SNF for rehab. I expect that this episode will follow a similar course. Hospitalist paged.  [CS]  2155 Spoke with Hospitalist who will evaluate for admission.  [CS]    Clinical Course User Index [CS] Truddie Hidden, MD    Final Clinical Impression(s) / ED Diagnoses Final diagnoses:  AKI (acute kidney injury) ([facility])  Acute cystitis without hematuria  Physical deconditioning    Rx / DC Orders ED Discharge Orders    None       Truddie Hidden, MD 11/05/20 2156

## 2020-11-05 NOTE — ED Notes (Signed)
Message sent to pharmacy regarding night time meds.

## 2020-11-05 NOTE — H&P (Addendum)
[facility]   PATIENT NAME: Carolyn Robertson    MR#:  [id number]  DATE OF BIRTH:  04-03-1946  DATE OF ADMISSION:  11/05/2020  PRIMARY CARE PHYSICIAN: Burnard Bunting, MD   REQUESTING/REFERRING PHYSICIAN:Sheldon, Mercie Eon, MD  CHIEF COMPLAINT:   Chief Complaint  Patient presents with  . Weakness  . Failure To Thrive    HISTORY OF PRESENT ILLNESS:  Allyna Pittsley  is a 75 y.o. Caucasian female with a known history of multiple medical problems that are mentioned below including hypertension, hypothyroidism, stage IV chronic kidney disease, CVA and asthma, who presented to the emergency room with acute onset of generalized weakness with diminished appetite over the last couple of weeks.  She has been having dysuria as well as urinary frequency and urgency.  She had a fall on 12/26 with subsequent abrasion and skin tear to the right wrist that her husband has been treating at home.  Her PCP was called today to have her placed in [facility] for short-term rehabilitation.  She was advised to come to the ER.  No reported fever or chills.  She admitted to diarrhea.  No nausea or vomiting or abdominal pain.  No rhinorrhea or nasal congestion or sore throat.  No  dyspnea or cough or wheezing.  No chest pain or palpitations.  She has been vaccinated for COVID-19 with 2 doses in addition to a third dose booster.  Upon presentation to the ER, blood pressure was 141/89 with a heart rate of 54 and pulse oximetry 96% on room air.  Respiratory rate was initially normal and later 25.  Labs revealed mild hyponatremia and a BUN of 24 with creatinine of 2.51 slightly higher than 1.9 on 02/17/2020.  CBC showed leukocytosis 15 with neutrophilia.  Urinalysis was positive for UTI.  Portable chest ray showed no acute cardiopulmonary disease and healing right seventh posterior rib fracture. EKG showed sinus rhythm with rate of 96 with PACs and possible Q waves in V1 with LVH and T wave inversion laterally.   COVID-19 PCR came back positive.  The patient was given IV Rocephin and 1 L bolus of IV normal saline.  She will be admitted to the medical bed for further evaluation and management.    PAST MEDICAL HISTORY:   Past Medical History:  Diagnosis Date  . Anxiety   . Arthritis    RA  . 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 Depression   . GERD (gastroesophageal reflux disease)   . History of blood transfusion    after miscarrige  . History of kidney stones    x 2  . Hypertension   . Hypothyroidism   . Incontinent of urine    incontinent at night  . Kidney failure   . Memory loss    "after TIA"  . Pneumonia    as a child and teenager  . Renal disorder    stage IV kidney disease  . Stroke ([place])   . Thyroid disease   . TIA (transient ischemic attack) 06/2018    PAST SURGICAL HISTORY:   Past Surgical History:  Procedure Laterality Date  . CHOLECYSTECTOMY    . DILATION AND CURETTAGE OF UTERUS    . EYE SURGERY Right    retina surgery  . KNEE SURGERY     bilat knees  . LUMBAR LAMINECTOMY/DECOMPRESSION MICRODISCECTOMY Right 04/12/2018   Procedure: MICRODISCECTOMY LUMBAR FOUR- LUMBAR FIVE;  Surgeon: Newman Pies, MD;  Location: [facility];  Service: Neurosurgery;  Laterality: Right;  MICRODISCECTOMY LUMBAR FOUR- LUMBAR FIVE  . TONSILLECTOMY      SOCIAL HISTORY:   Social History   Tobacco Use  . Smoking status: Never Smoker  . Smokeless tobacco: Never Used  Substance Use Topics  . Alcohol use: No    Comment: rarely    FAMILY HISTORY:   Family History  Problem Relation Age of Onset  . Atrial fibrillation Mother   . Stroke Father     DRUG ALLERGIES:   Allergies  Allergen Reactions  . Other Swelling    A bandage/gauze that was used on lower extremity wound at [facility], unsure type- Pt states her foot turned red and swelled up  . Penicillins Swelling and Rash    Has patient had a PCN reaction causing immediate rash,  facial/tongue/throat swelling, SOB or lightheadedness with hypotension: Unknown Has patient had a PCN reaction causing severe rash involving mucus membranes or skin necrosis: Unknown Has patient had a PCN reaction that required hospitalization: No Has patient had a PCN reaction occurring within the last 10 years: No If all of the above answers are "NO", then may proceed with Cephalosporin use.   . Sulfa Antibiotics Itching and Swelling  . Codeine Nausea Only  . Hydrocodone-Acetaminophen Other (See Comments)    Causes hallucinations  . Naproxen Sodium Swelling and Other (See Comments)    (ALEVE) Red Man Syndrome  . Tape Rash and Other (See Comments)    BURNS SKIN --PAPER TAPE IS OKAY    REVIEW OF SYSTEMS:   ROS As per history of present illness. All pertinent systems were reviewed above. Constitutional, HEENT, cardiovascular, respiratory, GI, GU, musculoskeletal, neuro, psychiatric, endocrine, integumentary and hematologic systems were reviewed and are otherwise negative/unremarkable except for positive findings mentioned above in the HPI.   MEDICATIONS AT HOME:   Prior to Admission medications   Medication Sig Start Date End Date Taking? Authorizing Provider  aspirin 81 MG EC tablet Take 1 tablet (81 mg total) by mouth daily. 06/27/18   Kinnie Feil, MD  baclofen (LIORESAL) 10 MG tablet Take 10 mg by mouth 2 (two) times daily as needed for muscle spasms. 02/12/20   [provider]  cyanocobalamin (,VITAMIN B-12,) 1000 MCG/ML injection Inject 1 mL into the skin every 28 (twenty-eight) days. 05/08/19   [provider]  DULoxetine (CYMBALTA) 60 MG capsule Take 60 mg by mouth at bedtime.    [provider]  estrogen, conjugated,-medroxyprogesterone (PREMPRO) 0.625-2.5 MG per tablet Take 1 tablet by mouth daily.    [provider]  Febuxostat 80 MG TABS Take 40 mg by mouth at bedtime.     [provider]  furosemide (LASIX) 40 MG tablet Take  40 mg by mouth daily as needed for edema.     [provider]  gabapentin (NEURONTIN) 100 MG capsule Take 100 mg by mouth daily.    [provider]  HYDROcodone-acetaminophen (NORCO/VICODIN) 5-325 MG tablet Take 1 tablet by mouth every 6 (six) hours as needed. 02/18/20   Patrecia Pour, MD  levothyroxine (SYNTHROID) 200 MCG tablet Take 200 mcg by mouth daily. Take 258mcg by mouth Patient not taking: Reported on 07/02/2020 05/28/19   [provider]  nystatin cream (MYCOSTATIN) Apply to affected area 2 times daily 04/12/19   Antonietta Breach, PA-C  pantoprazole (PROTONIX) 40 MG tablet Take 40 mg by mouth daily before breakfast. 02/22/18   [provider]  SYNTHROID 175 MCG tablet Take 175 mcg by  mouth daily before breakfast. Take 132mcg by mouth on Tuesdays, Thursdays, Saturdays and Sundays 01/05/18   [provider]  trimethoprim (TRIMPEX) 100 MG tablet Take 100 mg by mouth daily. 07/17/19   [provider]      VITAL SIGNS:  Blood pressure 130/80, pulse 65, temperature 98.2 F (36.8 C), resp. rate 20, height 5\' 4"  (1.626 m), weight 57.6 kg, SpO2 96 %.  PHYSICAL EXAMINATION:  Physical Exam  GENERAL:  75 y.o.-year-old Caucasian female patient lying in the bed with no acute distress.  EYES: Pupils equal, round, reactive to light and accommodation. No scleral icterus. Extraocular muscles intact.  HEENT: Head atraumatic, normocephalic. Oropharynx and nasopharynx clear.  NECK:  Supple, no jugular venous distention. No thyroid enlargement, no tenderness.  LUNGS: Normal breath sounds bilaterally, no wheezing, rales,rhonchi or crepitation. No use of accessory muscles of respiration.  CARDIOVASCULAR: Regular rate and rhythm, S1, S2 normal. No murmurs, rubs, or gallops.  ABDOMEN: Soft, nondistended with mild suprapubic tenderness without rebound tenderness guarding or rigidity. Bowel sounds present. No organomegaly or mass.  EXTREMITIES: No pedal edema,  cyanosis, or clubbing.  NEUROLOGIC: Cranial nerves II through XII are intact. Muscle strength 5/5 in all extremities. Sensation intact. Gait not checked.  PSYCHIATRIC: The patient is alert and oriented x 3.  Normal affect and good eye contact. SKIN: She has bilateral lower extremity contusions and clean healing right dorsal wrist skin tear.  No other lesions or ulcers.  LABORATORY PANEL:   CBC Recent Labs  Lab 11/05/20 1707  WBC 15.0*  HGB 13.1  HCT 41.6  PLT 422*   ------------------------------------------------------------------------------------------------------------------  Chemistries  Recent Labs  Lab 11/05/20 1707  NA 134*  K 3.7  CL 100  CO2 21*  GLUCOSE 105*  BUN 24*  CREATININE 2.51*  CALCIUM 9.1  MG 1.8  AST 32  ALT 17  ALKPHOS 83  BILITOT 0.6   ------------------------------------------------------------------------------------------------------------------  Cardiac Enzymes No results for input(s): TROPONINI in the last 168 hours. ------------------------------------------------------------------------------------------------------------------  RADIOLOGY:  DG Chest Port 1 View  Result Date: 11/05/2020 CLINICAL DATA:  General weakness EXAM: PORTABLE CHEST 1 VIEW COMPARISON:  None. FINDINGS: Mildly diminished lung volumes. No consolidation or effusion. Normal cardiomediastinal silhouette. No pneumothorax. Opacity over the right lower lung felt secondary to healing right seventh posterior rib fracture. IMPRESSION: No active disease. Probable healing right seventh posterior rib fracture. Electronically Signed   By: Donavan Foil M.D.   On: 11/05/2020 17:58      IMPRESSION AND PLAN:   1.  Urinary tract infection with subsequent sepsis and generalized weakness with failure to thrive and recent fall.  Sepsis manifested by leukocytosis and tachypnea. - The patient will be admitted to a medical bed. - We will continue IV antibiotic therapy with IV  Rocephin. - We will continue hydration with IV normal saline. - We will follow urine and blood culture as well as her CBC. - Case management consult to be obtained for rehabilitation placement. - PT consult will be obtained.  2.  Mild acute kidney injury superimposed on stage IV chronic kidney disease. - The patient will be hydrated with IV normal saline and will follow her BMP.  3.  COVID-19 infection. - The patient will be placed on IV remdesivir. - We will place her on vitamin C, vitamin D3, zinc sulfate and aspirin. - She has no current respiratory symptoms. - Isolation precautions will be followed.  4. Essential hypertension. - We will continue amlodipine.  5.  Gout. - We  will continue her Febuxostat.  6.  Hypothyroidism. - We will continue her Synthroid and check TSH.  7.  GERD. - We will continue her PPI therapy.  8.  Depression and anxiety. - We will continue her Cymbalta and mirtazapine.  9.  DVT prophylaxis. - Subcutaneous Lovenox  All the records are reviewed and case discussed with ED provider. The plan of care was discussed in details with the patient (and family). I answered all questions. The patient agreed to proceed with the above mentioned plan. Further management will depend upon hospital course.   CODE STATUS: Full code  Status is: Inpatient  Remains inpatient appropriate because:Ongoing diagnostic testing needed not appropriate for outpatient work up, Unsafe d/c plan, IV treatments appropriate due to intensity of illness or inability to take PO and Inpatient level of care appropriate due to severity of illness   Dispo: The patient is from: Home              Anticipated d/c is to: SNF              Anticipated d/c date is: 3 days              Patient currently is not medically stable to d/c.    TOTAL TIME TAKING CARE OF THIS PATIENT: 55 minutes.    Christel Mormon M.D on 11/05/2020 at 11:16 PM  [facility]   From 7 PM-7 AM, contact  night-coverage [email or web address]  CC: Primary care physician; Burnard Bunting, MD

## 2020-11-05 NOTE — ED Triage Notes (Signed)
Pt from home with ems c.o generalized weakness, no appetite, incontinence, failure to thrive for the past two weeks. Pt requesting to move to [facility] facility but her PCP told her to come here for evaluation first. Pt a.o, nad noted, VSS

## 2020-11-06 ENCOUNTER — Encounter (HOSPITAL_COMMUNITY): Payer: Self-pay | Admitting: Internal Medicine

## 2020-11-06 DIAGNOSIS — N3000 Acute cystitis without hematuria: Secondary | ICD-10-CM | POA: Diagnosis not present

## 2020-11-06 DIAGNOSIS — U071 COVID-19: Secondary | ICD-10-CM | POA: Diagnosis present

## 2020-11-06 DIAGNOSIS — E039 Hypothyroidism, unspecified: Secondary | ICD-10-CM | POA: Diagnosis not present

## 2020-11-06 DIAGNOSIS — N179 Acute kidney failure, unspecified: Secondary | ICD-10-CM | POA: Diagnosis not present

## 2020-11-06 LAB — PROCALCITONIN
Procalcitonin: 0.3 ng/mL
Procalcitonin: 0.29 ng/mL

## 2020-11-06 LAB — FIBRINOGEN: Fibrinogen: 437 mg/dL (ref 210–475)

## 2020-11-06 LAB — COMPREHENSIVE METABOLIC PANEL
ALT: 16 U/L (ref 0–44)
AST: 32 U/L (ref 15–41)
Albumin: 2.5 g/dL — ABNORMAL LOW (ref 3.5–5.0)
Alkaline Phosphatase: 78 U/L (ref 38–126)
Anion gap: 13 (ref 5–15)
BUN: 21 mg/dL (ref 8–23)
CO2: 19 mmol/L — ABNORMAL LOW (ref 22–32)
Calcium: 8.1 mg/dL — ABNORMAL LOW (ref 8.9–10.3)
Chloride: 103 mmol/L (ref 98–111)
Creatinine, Ser: 2.25 mg/dL — ABNORMAL HIGH (ref 0.44–1.00)
GFR, Estimated: 22 mL/min — ABNORMAL LOW (ref >60)
Glucose, Bld: 95 mg/dL (ref 70–99)
Potassium: 3.5 mmol/L (ref 3.5–5.1)
Sodium: 135 mmol/L (ref 135–145)
Total Bilirubin: 0.6 mg/dL (ref 0.3–1.2)
Total Protein: 4.8 g/dL — ABNORMAL LOW (ref 6.5–8.1)

## 2020-11-06 LAB — TROPONIN I (HIGH SENSITIVITY)
Troponin I (High Sensitivity): 28 ng/L — ABNORMAL HIGH (ref <18)
Troponin I (High Sensitivity): 31 ng/L — ABNORMAL HIGH (ref <18)

## 2020-11-06 LAB — CBC WITH DIFFERENTIAL/PLATELET
Abs Immature Granulocytes: 0.18 10*3/uL — ABNORMAL HIGH (ref 0.00–0.07)
Basophils Absolute: 0.1 10*3/uL (ref 0.0–0.1)
Basophils Relative: 1 %
Eosinophils Absolute: 0.1 10*3/uL (ref 0.0–0.5)
Eosinophils Relative: 1 %
HCT: 39.3 % (ref 36.0–46.0)
Hemoglobin: 12 g/dL (ref 12.0–15.0)
Immature Granulocytes: 2 %
Lymphocytes Relative: 11 %
Lymphs Abs: 1.2 10*3/uL (ref 0.7–4.0)
MCH: 26.4 pg (ref 26.0–34.0)
MCHC: 30.5 g/dL (ref 30.0–36.0)
MCV: 86.6 fL (ref 80.0–100.0)
Monocytes Absolute: 0.9 10*3/uL (ref 0.1–1.0)
Monocytes Relative: 8 %
Neutro Abs: 8.9 10*3/uL — ABNORMAL HIGH (ref 1.7–7.7)
Neutrophils Relative %: 77 %
Platelets: 345 10*3/uL (ref 150–400)
RBC: 4.54 MIL/uL (ref 3.87–5.11)
RDW: 18.4 % — ABNORMAL HIGH (ref 11.5–15.5)
WBC: 11.4 10*3/uL — ABNORMAL HIGH (ref 4.0–10.5)
nRBC: 0.2 % (ref 0.0–0.2)

## 2020-11-06 LAB — PROTIME-INR
INR: 1.2 (ref 0.8–1.2)
Prothrombin Time: 14.4 seconds (ref 11.4–15.2)

## 2020-11-06 LAB — T4, FREE: Free T4: 0.55 ng/dL — ABNORMAL LOW (ref 0.61–1.12)

## 2020-11-06 LAB — C-REACTIVE PROTEIN
CRP: 1 mg/dL — ABNORMAL HIGH (ref <1.0)
CRP: 1.2 mg/dL — ABNORMAL HIGH (ref <1.0)

## 2020-11-06 LAB — BRAIN NATRIURETIC PEPTIDE: B Natriuretic Peptide: 90.3 pg/mL (ref 0.0–100.0)

## 2020-11-06 LAB — FERRITIN: Ferritin: 24 ng/mL (ref 11–307)

## 2020-11-06 LAB — LACTATE DEHYDROGENASE: LDH: 258 U/L — ABNORMAL HIGH (ref 98–192)

## 2020-11-06 LAB — D-DIMER, QUANTITATIVE
D-Dimer, Quant: 3 ug/mL-FEU — ABNORMAL HIGH (ref 0.00–0.50)
D-Dimer, Quant: 3.19 ug/mL-FEU — ABNORMAL HIGH (ref 0.00–0.50)

## 2020-11-06 LAB — TSH: TSH: 227.067 u[IU]/mL — ABNORMAL HIGH (ref 0.350–4.500)

## 2020-11-06 LAB — CORTISOL-AM, BLOOD: Cortisol - AM: 14.9 ug/dL (ref 6.7–22.6)

## 2020-11-06 MED ORDER — FAMOTIDINE 20 MG PO TABS
20.0000 mg | ORAL_TABLET | Freq: Every day | ORAL | Status: DC
  Administered 2020-11-07 – 2020-11-15 (×9): 20 mg via ORAL
  Filled 2020-11-06 (×9): qty 1

## 2020-11-06 MED ORDER — SODIUM CHLORIDE 0.9 % IV BOLUS
250.0000 mL | Freq: Once | INTRAVENOUS | Status: AC
  Administered 2020-11-06: 250 mL via INTRAVENOUS

## 2020-11-06 NOTE — ED Notes (Signed)
Pt has not eaten breakfast or lunch.  Encouraged food, but pt refused.  Will order ice cream.  Pt will drink sips of tea or juice with meds.

## 2020-11-06 NOTE — ED Notes (Signed)
Received call from lab stating they need another yellow top tube for T3, send out lab.

## 2020-11-06 NOTE — ED Notes (Signed)
Breakfast Ordered 

## 2020-11-06 NOTE — Progress Notes (Signed)
PROGRESS NOTE                                                                                                                                                                                                             Patient Demographics:    Carolyn Robertson, is a 75 y.o. female, DOB - 09/02/1946, ZOX:[id number]  Outpatient Primary MD for the patient is Burnard Bunting, MD   Admit date - 11/05/2020   LOS - 1  Chief Complaint  Patient presents with   Weakness   Failure To Thrive       Brief Narrative: Patient is a 75 y.o. female with PMHx of hypothyroidism, stage IV CKD, CVA, asthma-presented with generalized weakness, dysuria and frequent falls.  Found to have UTI and breakthrough COVID-19 infection.  See below for further details.  COVID-19 vaccinated status: Vaccinated including booster.  Significant Events: 1/20>> Admit to [facility] for weakness/falls-with UTI and breakthrough COVID-19 infection.  Significant studies: 1/20>Chest x-ray: No active disease  COVID-19 medications: Remdesivir: 1/20>>  Antibiotics: Rocephin: 1/20>>  Microbiology data: 1/20 >> urine culture: Pending  Procedures: None  Consults: None  DVT prophylaxis: enoxaparin (LOVENOX) injection 30 mg Start: 11/06/20 1000    Subjective:    Carolyn Robertson today is awake-alert-not short of breath-lying comfortably in bed.  Main complaint is weakness.   Assessment  & Plan :   Sepsis secondary to UTI: Sepsis physiology has resolved-continue Rocephin-await culture data.    AKI on CKD stage IV: AKI hemodynamically mediated-improving with supportive care.  Breakthrough COVID-19 infection: No major respiratory symptoms-chest x-ray negative for pneumonia-not hypoxic-we will plan on 3 days of Remdesivir.  Fever: afebrile O2 requirements:  SpO2: 91 %   COVID-19 Labs: Recent Labs    11/06/20 0003 11/06/20 0249  DDIMER 3.00* 3.19*   FERRITIN 24  --   LDH 258*  --   CRP 1.0* 1.2*       Component Value Date/Time   BNP 90.3 11/06/2020 0003    Recent Labs  Lab 11/06/20 0003 11/06/20 0249  PROCALCITON 0.30 0.29    Lab Results  Component Value Date   SARSCOV2NAA POSITIVE (A) 11/05/2020   [place] NEGATIVE 02/16/2020   SARSCOV2NAA NOT DETECTED 03/26/2019    HTN: BP stable-continue amlodipine.  Hypothyroidism: TSH 227! Per patient-she has not taken Synthroid in the past several months-we will  resume her home dose. She clinically does not appear to be in myxedema-appears to have severe hypothyroidism. Watch closely.  GERD: Continue PPI  Depression with anxiety: Stable-continue Cymbalta/mirtazapine  Gout: Continue Febuxostat  Debility/deconditioning/frequent falls: Due to acute illness with COVID/UTI-await PT/OT eval.   GI prophylaxis: PPI  ABG:    Component Value Date/Time   TCO2 26 03/26/2019 1449    Vent Settings: N/A    Condition - Guarded  Family Communication  :  Spouse-Hubert-[phone] updated over the phone 1/21  Code Status :  Full Code  Diet :  Diet Order            Diet Heart Room service appropriate? Yes; Fluid consistency: Thin  Diet effective now                  Disposition Plan  :   Status is: Inpatient  Remains inpatient appropriate because:Inpatient level of care appropriate due to severity of illness   Dispo: The patient is from: Home              Anticipated d/c is to: Home              Anticipated d/c date is: > 3 days              Patient currently is not medically stable to d/c.   Barriers to discharge: Complete3 days of IV Remdesivir/on IV Rocephin-severe debility-needs PT/OT eval to determine appropriate disposition.  Antimicorbials  :    Anti-infectives (From admission, onward)   Start     Dose/Rate Route Frequency Ordered Stop   11/07/20 1000  remdesivir 100 mg in sodium chloride 0.9 % 100 mL IVPB       "Followed by" Linked Group Details    100 mg 200 mL/hr over 30 Minutes Intravenous Daily 11/05/20 2342 11/11/20 0959   11/06/20 2200  cefTRIAXone (ROCEPHIN) 1 g in sodium chloride 0.9 % 100 mL IVPB        1 g 200 mL/hr over 30 Minutes Intravenous Every 24 hours 11/05/20 2315     11/06/20 0100  remdesivir 200 mg in sodium chloride 0.9% 250 mL IVPB       "Followed by" Linked Group Details   200 mg 580 mL/hr over 30 Minutes Intravenous Once 11/05/20 2342 11/06/20 0128   11/05/20 2145  cefTRIAXone (ROCEPHIN) 1 g in sodium chloride 0.9 % 100 mL IVPB        1 g 200 mL/hr over 30 Minutes Intravenous  Once 11/05/20 2138 11/05/20 2217      Inpatient Medications  Scheduled Meds:  vitamin C  500 mg Oral Daily   aspirin EC  81 mg Oral Daily   cholecalciferol  1,000 Units Oral Daily   [START ON 11/17/2020] cyanocobalamin  1,000 mcg Subcutaneous Q28 days   DULoxetine  60 mg Oral QHS   enoxaparin (LOVENOX) injection  30 mg Subcutaneous Q24H   estrogen (conjugated)-medroxyprogesterone  1 tablet Oral Daily   famotidine  20 mg Oral BID   febuxostat  40 mg Oral QHS   gabapentin  100 mg Oral Daily   levothyroxine  175 mcg Oral Q0600   pantoprazole  40 mg Oral QAC breakfast   zinc sulfate  220 mg Oral Daily   Continuous Infusions:  sodium chloride 100 mL/hr at 11/05/20 2359   cefTRIAXone (ROCEPHIN)  IV     [START ON 11/07/2020] remdesivir 100 mg in NS 100 mL     PRN Meds:.acetaminophen **OR** acetaminophen, baclofen, furosemide,  HYDROcodone-acetaminophen, magnesium hydroxide, ondansetron **OR** ondansetron (ZOFRAN) IV, traZODone   Time Spent in minutes  35    See all Orders from today for further details   Oren Binet M.D on 11/06/2020 at 11:30 AM  To page go to [email or web address] - use universal password  [facility] -  Office  [phone]    Objective:   Vitals:   11/06/20 0500 11/06/20 0600 11/06/20 0845 11/06/20 0900  BP: (!) 124/56 (!) 148/61 (!) 133/103 (!) 147/62  Pulse: (!) 32 62 98 (!)  31  Resp: (!) 23 (!) 31 19 (!) 26  Temp:      TempSrc:      SpO2: 91% 92% 94% 91%  Weight:      Height:        Wt Readings from Last 3 Encounters:  11/05/20 57.6 kg  04/24/20 57.6 kg  04/15/20 58 kg    No intake or output data in the 24 hours ending 11/06/20 1130   Physical Exam Gen Exam:Alert awake-not in any distress HEENT:atraumatic, normocephalic Chest: B/L clear to auscultation anteriorly CVS:S1S2 regular Abdomen:soft non tender, non distended Extremities:no edema Neurology: Non focal Skin: no rash   Data Review:    CBC Recent Labs  Lab 11/05/20 1707 11/06/20 0249  WBC 15.0* 11.4*  HGB 13.1 12.0  HCT 41.6 39.3  PLT 422* 345  MCV 85.2 86.6  MCH 26.8 26.4  MCHC 31.5 30.5  RDW 18.4* 18.4*  LYMPHSABS 0.8 1.2  MONOABS 0.9 0.9  EOSABS 0.2 0.1  BASOSABS 0.1 0.1    Chemistries  Recent Labs  Lab 11/05/20 1707 11/06/20 0249  NA 134* 135  K 3.7 3.5  CL 100 103  CO2 21* 19*  GLUCOSE 105* 95  BUN 24* 21  CREATININE 2.51* 2.25*  CALCIUM 9.1 8.1*  MG 1.8  --   AST 32 32  ALT 17 16  ALKPHOS 83 78  BILITOT 0.6 0.6   ------------------------------------------------------------------------------------------------------------------ No results for input(s): CHOL, HDL, LDLCALC, TRIG, CHOLHDL, LDLDIRECT in the last 72 hours.  Lab Results  Component Value Date   HGBA1C 5.5 06/25/2018   ------------------------------------------------------------------------------------------------------------------ Recent Labs    11/06/20 0003  TSH 227.067*   ------------------------------------------------------------------------------------------------------------------ Recent Labs    11/06/20 0003  FERRITIN 24    Coagulation profile Recent Labs  Lab 11/06/20 0249  INR 1.2    Recent Labs    11/06/20 0003 11/06/20 0249  DDIMER 3.00* 3.19*    Cardiac Enzymes No results for input(s): CKMB, TROPONINI, MYOGLOBIN in the last 168 hours.  Invalid  input(s): CK ------------------------------------------------------------------------------------------------------------------    Component Value Date/Time   BNP 90.3 11/06/2020 0003    Micro Results Recent Results (from the past 240 hour(s))  SARS CORONAVIRUS 2 (TAT 6-24 HRS) Nasopharyngeal Nasopharyngeal Swab     Status: Abnormal   Collection Time: 11/05/20  5:08 PM   Specimen: Nasopharyngeal Swab  Result Value Ref Range Status   SARS Coronavirus 2 POSITIVE (A) NEGATIVE Final    Comment: (NOTE) SARS-CoV-2 target nucleic acids are DETECTED.  The SARS-CoV-2 RNA is generally detectable in upper and lower respiratory specimens during the acute phase of infection. Positive results are indicative of the presence of SARS-CoV-2 RNA. Clinical correlation with patient history and other diagnostic information is  necessary to determine patient infection status. Positive results do not rule out bacterial infection or co-infection with other viruses.  The expected result is Negative.  Fact Sheet for Patients: SugarRoll.be  Fact Sheet for Healthcare Providers: https://www.woods-mathews.com/  This test  is not yet approved or cleared by the [place] and  has been authorized for detection and/or diagnosis of SARS-CoV-2 by FDA under an Emergency Use Authorization (EUA). This EUA will remain  in effect (meaning this test can be used) for the duration of the COVID-19 declaration under Section 564(b)(1) of the Act, 21 U. S.C. section 360bbb-3(b)(1), unless the authorization is terminated or revoked sooner.   Performed at [facility], [place] [address] [place], [place] [address]     Radiology Reports DG Chest [place] 1 View  Result Date: 11/05/2020 CLINICAL DATA:  General weakness EXAM: PORTABLE CHEST 1 VIEW COMPARISON:  None. FINDINGS: Mildly diminished lung volumes. No consolidation or effusion. Normal cardiomediastinal silhouette. No  pneumothorax. Opacity over the right lower lung felt secondary to healing right seventh posterior rib fracture. IMPRESSION: No active disease. Probable healing right seventh posterior rib fracture. Electronically Signed   By: Donavan Foil M.D.   On: 11/05/2020 17:58

## 2020-11-06 NOTE — ED Notes (Signed)
Attempted to call report , receiving RN to call back.

## 2020-11-06 NOTE — Evaluation (Signed)
Physical Therapy Evaluation Patient Details Name: Carolyn Robertson MRN: [id number] DOB: [birth year] Today's Date: 11/06/2020   History of Present Illness  Pt is a 75 y/o female admitted secondary to weakness and failure to thrive. Found to be COVID +. Also with UTI. PMH includes memory loss, CVA, HTN, and asthma.  Clinical Impression  Pt admitted secondary to problem above with deficits below. Pt requiring total A to perform bed mobility tasks. Pt with memory deficits per chart.  Pt's feet in a PF position, so unsure of how much pt was ambulating at baseline, although she reports she was using RW. Unsure of family support at home. Feel she would benefit from SNF level therapies at d/c. Will continue to follow acutely.     Follow Up Recommendations SNF;Supervision/Assistance - 24 hour (max HH if family takes pt home)    Financial risk analyst (measurements PT);Wheelchair cushion (measurements PT);Hospital bed;Other (comment) (hoyer lift with pad)    Recommendations for Other Services       Precautions / Restrictions Precautions Precautions: Fall Restrictions Weight Bearing Restrictions: No      Mobility  Bed Mobility Overal bed mobility: Needs Assistance Bed Mobility: Supine to Sit;Sit to Supine     Supine to sit: Total assist Sit to supine: Total assist   General bed mobility comments: total A to perform bed mobility tasks. Pt unable to maintain sitting balance without max A.    Transfers                 General transfer comment: unsafe to attempt  Ambulation/Gait                Stairs            Wheelchair Mobility    Modified Rankin (Stroke Patients Only)       Balance Overall balance assessment: Needs assistance Sitting-balance support: Bilateral upper extremity supported Sitting balance-Leahy Scale: Zero Sitting balance - Comments: MAx A to maintain sitting balance                                      Pertinent Vitals/Pain Pain Assessment: Faces Faces Pain Scale: Hurts little more Pain Location: generalized Pain Descriptors / Indicators: Aching Pain Intervention(s): Limited activity within patient's tolerance;Monitored during session;Repositioned    Home Living Family/patient expects to be discharged to:: Private residence Living Arrangements: Spouse/significant other Available Help at Discharge: Family;Available 24 hours/day Type of Home: House Home Access: Stairs to enter   [employer] of Steps: 1 Home Layout: One level Home Equipment: Walker - 2 wheels;Transport chair;Grab bars - tub/shower;Toilet riser;Shower seat Additional Comments: Taken from previous admission, as pt unable to accurately report    Prior Function Level of Independence: Needs assistance   Gait / Transfers Assistance Needed: Pt reporting conflicting information. Initially reports she uses RW, then reports she has been using WC, then reports she hasn't been out of the bed in 2 weeks. Assume pt required assist with all mobility tasks.  ADL's / Homemaking Assistance Needed: Pt reports sponge bathing in the bed and required assist for other ADLs.        Hand Dominance        Extremity/Trunk Assessment   Upper Extremity Assessment Upper Extremity Assessment: Defer to OT evaluation    Lower Extremity Assessment Lower Extremity Assessment: RLE deficits/detail;LLE deficits/detail RLE Deficits / Details: Limited ROM into DF. PROM at knee [facility]. LLE  Deficits / Details: Pt with limited knee flexion; noted popping noises in knee with flexion. Limited DF as well.       Communication   Communication: No difficulties  Cognition Arousal/Alertness: Awake/alert Behavior During Therapy: WFL for tasks assessed/performed Overall Cognitive Status: No family/caregiver present to determine baseline cognitive functioning                                 General Comments: Pt with memory  deficits at baseline. Conflicting information reported throughout.      General Comments      Exercises     Assessment/Plan    PT Assessment Patient needs continued PT services  PT Problem List Decreased strength;Decreased balance;Decreased activity tolerance;Decreased mobility;Decreased knowledge of use of DME;Decreased cognition;Decreased safety awareness;Decreased knowledge of precautions;Decreased coordination;Decreased range of motion       PT Treatment Interventions DME instruction;Functional mobility training;Therapeutic activities;Therapeutic exercise;Balance training;Gait training;Patient/family education;Cognitive remediation    PT Goals (Current goals can be found in the Care Plan section)  Acute Rehab PT Goals PT Goal Formulation: Patient unable to participate in goal setting Time For Goal Achievement: 11/20/20 Potential to Achieve Goals: Fair    Frequency Min 2X/week   Barriers to discharge        Co-evaluation               AM-PAC PT "6 Clicks" Mobility  Outcome Measure Help needed turning from your back to your side while in a flat bed without using bedrails?: Total Help needed moving from lying on your back to sitting on the side of a flat bed without using bedrails?: Total Help needed moving to and from a bed to a chair (including a wheelchair)?: Total Help needed standing up from a chair using your arms (e.g., wheelchair or bedside chair)?: Total Help needed to walk in hospital room?: Total Help needed climbing 3-5 steps with a railing? : Total 6 Click Score: 6    End of Session   Activity Tolerance: Patient limited by fatigue Patient left: in bed;with call bell/phone within reach (on stretcher in ED) Nurse Communication: Mobility status PT Visit Diagnosis: Unsteadiness on feet (R26.81);Difficulty in walking, not elsewhere classified (R26.2)    Time: 1206-1222 PT Time Calculation (min) (ACUTE ONLY): 16 min   Charges:   PT Evaluation $PT  Eval Moderate Complexity: 1 Mod          Reuel Derby, PT, DPT  Acute Rehabilitation Services  Pager: [phone] Office: [phone]   Rudean Hitt 11/06/2020, 4:37 PM

## 2020-11-06 NOTE — ED Notes (Signed)
Attempted to call report, receiving RN unavailable.  

## 2020-11-06 NOTE — ED Notes (Signed)
Lunch Tray Ordered @ 1046. 

## 2020-11-06 NOTE — ED Notes (Signed)
Only 50 ml of urine output noted in suction canister.  Pt bladder scanned and 50 ml urine noted.  Dr Sidney Ace notified.

## 2020-11-06 NOTE — ED Notes (Signed)
Fed patient vanilla ice cream, patient at all of small container, tolerated well.

## 2020-11-06 NOTE — ED Notes (Signed)
Called and ordered pt ice cream.

## 2020-11-07 DIAGNOSIS — R5381 Other malaise: Secondary | ICD-10-CM

## 2020-11-07 DIAGNOSIS — N179 Acute kidney failure, unspecified: Secondary | ICD-10-CM | POA: Diagnosis not present

## 2020-11-07 DIAGNOSIS — U071 COVID-19: Secondary | ICD-10-CM | POA: Diagnosis not present

## 2020-11-07 DIAGNOSIS — N3000 Acute cystitis without hematuria: Secondary | ICD-10-CM | POA: Diagnosis not present

## 2020-11-07 LAB — CBC WITH DIFFERENTIAL/PLATELET
Abs Immature Granulocytes: 0.14 10*3/uL — ABNORMAL HIGH (ref 0.00–0.07)
Basophils Absolute: 0.1 10*3/uL (ref 0.0–0.1)
Basophils Relative: 1 %
Eosinophils Absolute: 0.1 10*3/uL (ref 0.0–0.5)
Eosinophils Relative: 1 %
HCT: 37.4 % (ref 36.0–46.0)
Hemoglobin: 11.5 g/dL — ABNORMAL LOW (ref 12.0–15.0)
Immature Granulocytes: 2 %
Lymphocytes Relative: 16 %
Lymphs Abs: 1.5 10*3/uL (ref 0.7–4.0)
MCH: 26.6 pg (ref 26.0–34.0)
MCHC: 30.7 g/dL (ref 30.0–36.0)
MCV: 86.4 fL (ref 80.0–100.0)
Monocytes Absolute: 1 10*3/uL (ref 0.1–1.0)
Monocytes Relative: 11 %
Neutro Abs: 6.3 10*3/uL (ref 1.7–7.7)
Neutrophils Relative %: 69 %
Platelets: 328 10*3/uL (ref 150–400)
RBC: 4.33 MIL/uL (ref 3.87–5.11)
RDW: 18.4 % — ABNORMAL HIGH (ref 11.5–15.5)
WBC: 9 10*3/uL (ref 4.0–10.5)
nRBC: 0 % (ref 0.0–0.2)

## 2020-11-07 LAB — COMPREHENSIVE METABOLIC PANEL
ALT: 17 U/L (ref 0–44)
AST: 49 U/L — ABNORMAL HIGH (ref 15–41)
Albumin: 2.2 g/dL — ABNORMAL LOW (ref 3.5–5.0)
Alkaline Phosphatase: 71 U/L (ref 38–126)
Anion gap: 12 (ref 5–15)
BUN: 21 mg/dL (ref 8–23)
CO2: 19 mmol/L — ABNORMAL LOW (ref 22–32)
Calcium: 7.7 mg/dL — ABNORMAL LOW (ref 8.9–10.3)
Chloride: 105 mmol/L (ref 98–111)
Creatinine, Ser: 2.28 mg/dL — ABNORMAL HIGH (ref 0.44–1.00)
GFR, Estimated: 22 mL/min — ABNORMAL LOW (ref >60)
Glucose, Bld: 80 mg/dL (ref 70–99)
Potassium: 3.1 mmol/L — ABNORMAL LOW (ref 3.5–5.1)
Sodium: 136 mmol/L (ref 135–145)
Total Bilirubin: 0.4 mg/dL (ref 0.3–1.2)
Total Protein: 4.3 g/dL — ABNORMAL LOW (ref 6.5–8.1)

## 2020-11-07 LAB — C-REACTIVE PROTEIN: CRP: 3 mg/dL — ABNORMAL HIGH (ref <1.0)

## 2020-11-07 LAB — D-DIMER, QUANTITATIVE: D-Dimer, Quant: 2.96 ug/mL-FEU — ABNORMAL HIGH (ref 0.00–0.50)

## 2020-11-07 MED ORDER — POTASSIUM CHLORIDE CRYS ER 20 MEQ PO TBCR
40.0000 meq | EXTENDED_RELEASE_TABLET | Freq: Four times a day (QID) | ORAL | Status: AC
  Administered 2020-11-07: 40 meq via ORAL
  Filled 2020-11-07 (×2): qty 2

## 2020-11-07 MED ORDER — LOPERAMIDE HCL 2 MG PO CAPS
2.0000 mg | ORAL_CAPSULE | ORAL | Status: DC | PRN
  Administered 2020-11-07 – 2020-11-08 (×3): 2 mg via ORAL
  Filled 2020-11-07 (×4): qty 1

## 2020-11-07 NOTE — Progress Notes (Addendum)
PROGRESS NOTE                                                                                                                                                                                                             Patient Demographics:    Carolyn Robertson, is a 75 y.o. female, DOB - 08/01/1946, YDX:[id number]  Outpatient Primary MD for the patient is Burnard Bunting, MD   Admit date - 11/05/2020   LOS - 2  Chief Complaint  Patient presents with  . Weakness  . Failure To Thrive       Brief Narrative: Patient is a 75 y.o. female with PMHx of hypothyroidism, stage IV CKD, CVA, asthma-presented with generalized weakness, dysuria and frequent falls.  Found to have UTI and breakthrough COVID-19 infection.  See below for further details.  COVID-19 vaccinated status: Vaccinated including booster.  Significant Events: 1/20>> Admit to [facility] for weakness/falls-with UTI and breakthrough COVID-19 infection.  Significant studies: 1/20>Chest x-ray: No active disease  COVID-19 medications: Remdesivir: 1/20>>  Antibiotics: Rocephin: 1/20>>  Microbiology data: 1/20 >> urine culture: Proteus mirabilis-sensitivities pending.  Procedures: None  Consults: None  DVT prophylaxis: enoxaparin (LOVENOX) injection 30 mg Start: 11/06/20 1000    Subjective:   No major issues overnight-lying comfortably in bed.  Denies any chest pain or shortness of breath.    Assessment  & Plan :   Sepsis secondary to Proteus UTI: Sepsis physiology has resolved-urine culture growing Proteus-awaiting sensitivities-in the meantime continue with Rocephin.    AKI on CKD stage IV: AKI hemodynamically mediated-improved with IV fluid-daughter around 2.2 range.  Continue to monitor closely avoid nephrotoxic agents.  Breakthrough COVID-19 infection: Respiratory symptoms-no hypoxia-chest x-ray negative-planned 3-day course of Remdesivir   Fever:  afebrile O2 requirements:  SpO2: 93 % O2 Flow Rate (L/min): 2 L/min   COVID-19 Labs: Recent Labs    11/06/20 0003 11/06/20 0249 11/07/20 0432  DDIMER 3.00* 3.19* 2.96*  FERRITIN 24  --   --   LDH 258*  --   --   CRP 1.0* 1.2* 3.0*       Component Value Date/Time   BNP 90.3 11/06/2020 0003    Recent Labs  Lab 11/06/20 0003 11/06/20 0249  PROCALCITON 0.30 0.29    Lab Results  Component Value Date   SARSCOV2NAA POSITIVE (A) 11/05/2020   SARSCOV2NAA NEGATIVE  02/16/2020   SARSCOV2NAA NOT DETECTED 03/26/2019    HTN: BP stable-continue amlodipine.  Hypothyroidism: TSH 227!  She likely has not taken Synthroid for the past several months-seems to be tolerating levothyroxine at 175 mcg/day-we will watch closely for the next few days-and slowly increase her regimen.  As noted above-clinically she does not appear to have myxedema.  We will need repeat TSH in 3 months.   Hypokalemia: Replete and recheck.  GERD: Continue PPI  Depression with anxiety: Stable-continue Cymbalta/mirtazapine  Gout: Continue Febuxostat  Debility/deconditioning/frequent falls: Due to acute illness with COVID/UTI-PT with recommendations for SNF.  Social worker following.     GI prophylaxis: PPI  ABG:    Component Value Date/Time   TCO2 26 03/26/2019 1449    Vent Settings: N/A    Condition - Guarded  Family Communication  :  Spouse-Hubert-[phone] updated over the phone 1/21  Code Status :  Full Code  Diet :  Diet Order            Diet Heart Room service appropriate? Yes; Fluid consistency: Thin  Diet effective now                  Disposition Plan  :   Status is: Inpatient  Remains inpatient appropriate because:Inpatient level of care appropriate due to severity of illness   Dispo: The patient is from: Home              Anticipated d/c is to: SNF.              Anticipated d/c date is: > 3 days              Patient currently is not medically stable to  d/c.   Barriers to discharge: Complete3 days of IV Remdesivir/on IV Rocephin-severe debility-needs SNF per PT recommendation.  Antimicorbials  :    Anti-infectives (From admission, onward)   Start     Dose/Rate Route Frequency Ordered Stop   11/07/20 1000  remdesivir 100 mg in sodium chloride 0.9 % 100 mL IVPB       "Followed by" Linked Group Details   100 mg 200 mL/hr over 30 Minutes Intravenous Daily 11/05/20 2342 11/09/20 0959   11/06/20 2200  cefTRIAXone (ROCEPHIN) 1 g in sodium chloride 0.9 % 100 mL IVPB        1 g 200 mL/hr over 30 Minutes Intravenous Every 24 hours 11/05/20 2315     11/06/20 0100  remdesivir 200 mg in sodium chloride 0.9% 250 mL IVPB       "Followed by" Linked Group Details   200 mg 580 mL/hr over 30 Minutes Intravenous Once 11/05/20 2342 11/06/20 0128   11/05/20 2145  cefTRIAXone (ROCEPHIN) 1 g in sodium chloride 0.9 % 100 mL IVPB        1 g 200 mL/hr over 30 Minutes Intravenous  Once 11/05/20 2138 11/05/20 2217      Inpatient Medications  Scheduled Meds: . vitamin C  500 mg Oral Daily  . aspirin EC  81 mg Oral Daily  . cholecalciferol  1,000 Units Oral Daily  . [START ON 11/17/2020] cyanocobalamin  1,000 mcg Subcutaneous Q28 days  . DULoxetine  60 mg Oral QHS  . enoxaparin (LOVENOX) injection  30 mg Subcutaneous Q24H  . estrogen (conjugated)-medroxyprogesterone  1 tablet Oral Daily  . famotidine  20 mg Oral Daily  . febuxostat  40 mg Oral QHS  . gabapentin  100 mg Oral Daily  . levothyroxine  175 mcg  Oral Q0600  . pantoprazole  40 mg Oral QAC breakfast  . potassium chloride  40 mEq Oral Q6H  . zinc sulfate  220 mg Oral Daily   Continuous Infusions: . sodium chloride 100 mL/hr at 11/06/20 2349  . cefTRIAXone (ROCEPHIN)  IV Stopped (11/06/20 2300)  . remdesivir 100 mg in NS 100 mL 100 mg (11/07/20 0944)   PRN Meds:.acetaminophen **OR** acetaminophen, baclofen, HYDROcodone-acetaminophen, magnesium hydroxide, ondansetron **OR** ondansetron (ZOFRAN)  IV, traZODone   Time Spent in minutes  25    See all Orders from today for further details   Oren Binet M.D on 11/07/2020 at 1:27 PM  To page go to [email or web address] - use universal password  [facility] -  Office  [phone]    Objective:   Vitals:   11/06/20 2329 11/07/20 0440 11/07/20 0750 11/07/20 1235  BP: (!) 163/63 119/68 110/71 (!) 150/87  Pulse: 91 85 67   Resp: (!) 24 20 18    Temp: 98.5 F (36.9 C) 98.3 F (36.8 C) 98.2 F (36.8 C) 98.8 F (37.1 C)  TempSrc: Oral Axillary Oral Oral  SpO2: 96% 97% 93%   Weight:      Height:        Wt Readings from Last 3 Encounters:  11/05/20 57.6 kg  04/24/20 57.6 kg  04/15/20 58 kg     Intake/Output Summary (Last 24 hours) at 11/07/2020 1327 Last data filed at 11/07/2020 0948 Gross per 24 hour  Intake 720 ml  Output --  Net 720 ml     Physical Exam Gen Exam:Alert awake-not in any distress HEENT:atraumatic, normocephalic Chest: B/L clear to auscultation anteriorly CVS:S1S2 regular Abdomen:soft non tender, non distended Extremities:no edema Neurology: Non focal Skin: no rash   Data Review:    CBC Recent Labs  Lab 11/05/20 1707 11/06/20 0249 11/07/20 0432  WBC 15.0* 11.4* 9.0  HGB 13.1 12.0 11.5*  HCT 41.6 39.3 37.4  PLT 422* 345 328  MCV 85.2 86.6 86.4  MCH 26.8 26.4 26.6  MCHC 31.5 30.5 30.7  RDW 18.4* 18.4* 18.4*  LYMPHSABS 0.8 1.2 1.5  MONOABS 0.9 0.9 1.0  EOSABS 0.2 0.1 0.1  BASOSABS 0.1 0.1 0.1    Chemistries  Recent Labs  Lab 11/05/20 1707 11/06/20 0249 11/07/20 0432  NA 134* 135 136  K 3.7 3.5 3.1*  CL 100 103 105  CO2 21* 19* 19*  GLUCOSE 105* 95 80  BUN 24* 21 21  CREATININE 2.51* 2.25* 2.28*  CALCIUM 9.1 8.1* 7.7*  MG 1.8  --   --   AST 32 32 49*  ALT 17 16 17   ALKPHOS 83 78 71  BILITOT 0.6 0.6 0.4   ------------------------------------------------------------------------------------------------------------------ No results for input(s): CHOL, HDL,  LDLCALC, TRIG, CHOLHDL, LDLDIRECT in the last 72 hours.  Lab Results  Component Value Date   HGBA1C 5.5 06/25/2018   ------------------------------------------------------------------------------------------------------------------ Recent Labs    11/06/20 0003  TSH 227.067*   ------------------------------------------------------------------------------------------------------------------ Recent Labs    11/06/20 0003  FERRITIN 24    Coagulation profile Recent Labs  Lab 11/06/20 0249  INR 1.2    Recent Labs    11/06/20 0249 11/07/20 0432  DDIMER 3.19* 2.96*    Cardiac Enzymes No results for input(s): CKMB, TROPONINI, MYOGLOBIN in the last 168 hours.  Invalid input(s): CK ------------------------------------------------------------------------------------------------------------------    Component Value Date/Time   BNP 90.3 11/06/2020 0003    Micro Results Recent Results (from the past 240 hour(s))  Culture, Urine     Status: Abnormal (  Preliminary result)   Collection Time: 11/05/20  2:15 AM   Specimen: Urine, Random  Result Value Ref Range Status   Specimen Description URINE, RANDOM  Final   Special Requests NONE  Final   Culture (A)  Final    70,000 COLONIES/mL PROTEUS MIRABILIS SUSCEPTIBILITIES TO FOLLOW Performed at [facility], [address], [place], [place] [address]    Report Status PENDING  Incomplete  SARS CORONAVIRUS 2 (TAT 6-24 HRS) Nasopharyngeal Nasopharyngeal Swab     Status: Abnormal   Collection Time: 11/05/20  5:08 PM   Specimen: Nasopharyngeal Swab  Result Value Ref Range Status   SARS Coronavirus 2 POSITIVE (A) NEGATIVE Final    Comment: (NOTE) SARS-CoV-2 target nucleic acids are DETECTED.  The SARS-CoV-2 RNA is generally detectable in upper and lower respiratory specimens during the acute phase of infection. Positive results are indicative of the presence of SARS-CoV-2 RNA. Clinical correlation with patient history and  other diagnostic information is  necessary to determine patient infection status. Positive results do not rule out bacterial infection or co-infection with other viruses.  The expected result is Negative.  Fact Sheet for Patients: SugarRoll.be  Fact Sheet for Healthcare Providers: https://www.woods-mathews.com/  This test is not yet approved or cleared by the [place] FDA and  has been authorized for detection and/or diagnosis of SARS-CoV-2 by FDA under an Emergency Use Authorization (EUA). This EUA will remain  in effect (meaning this test can be used) for the duration of the COVID-19 declaration under Section 564(b)(1) of the Act, 21 U. S.C. section 360bbb-3(b)(1), unless the authorization is terminated or revoked sooner.   Performed at [facility], [place] [address] [place], [place] [address]     Radiology Reports DG Chest [place] 1 View  Result Date: 11/05/2020 CLINICAL DATA:  General weakness EXAM: PORTABLE CHEST 1 VIEW COMPARISON:  None. FINDINGS: Mildly diminished lung volumes. No consolidation or effusion. Normal cardiomediastinal silhouette. No pneumothorax. Opacity over the right lower lung felt secondary to healing right seventh posterior rib fracture. IMPRESSION: No active disease. Probable healing right seventh posterior rib fracture. Electronically Signed   By: Donavan Foil M.D.   On: 11/05/2020 17:58

## 2020-11-07 NOTE — TOC Initial Note (Signed)
Transition of Care [facility]) - Initial/Assessment Note    Patient Details  Name: Carolyn Robertson MRN: [id number] Date of Birth: 1946-02-27  Transition of [facility]) CM/SW Contact:    Verdell Carmine, RN Phone Number: 11/07/2020, 7:32 AM  Clinical Narrative:                 Admitted for weakness, UTI COVID positive patient has walker /cane at home. Recently fell. PT and PT consulted for recommendations. May need SNF. NO oxygen utilized at this time, no pneumonia on CXR. CM and CSW will follow patient for needs  Expected Discharge Plan: [place] Barriers to Discharge: Continued Medical Work up   Patient Goals and CMS Choice        Expected Discharge Plan and Services Expected Discharge Plan: [place] arrangements for the past 2 months: Single Family Home                                      Prior Living Arrangements/Services Living arrangements for the past 2 months: Single Family Home Lives with:: Spouse Patient language and need for interpreter reviewed:: Yes        Need for Family Participation in Patient Care: Yes (Comment) Care giver support system in place?: Yes (comment) Current home services: DME (cane walker) Criminal Activity/Legal Involvement Pertinent to Current Situation/Hospitalization: No - Comment as needed  Activities of Daily Living Home Assistive Devices/Equipment: Walker (specify type) ADL Screening (condition at time of admission) Patient's cognitive ability adequate to safely complete daily activities?: No Is the patient deaf or have difficulty hearing?: No Does the patient have difficulty seeing, even when wearing glasses/contacts?: No Does the patient have difficulty concentrating, remembering, or making decisions?: No Patient able to express need for assistance with ADLs?: Yes Does the patient have difficulty dressing or bathing?: Yes Independently performs ADLs?: No Does the patient have  difficulty walking or climbing stairs?: Yes Weakness of Legs: Both Weakness of Arms/Hands: Both  Permission Sought/Granted                  Emotional Assessment       Orientation: : Oriented to Self,Oriented to Place,Oriented to  Time,Oriented to Situation Alcohol / Substance Use: Not Applicable Psych Involvement: No (comment)  Admission diagnosis:  UTI (urinary tract infection) [N39.0] Physical deconditioning [R53.81] Acute cystitis without hematuria [N30.00] AKI (acute kidney injury) ([place]) [N17.9] COVID-19 virus infection [U07.1] Patient Active Problem List   Diagnosis Date Noted  . COVID-19 virus infection 11/06/2020  . UTI (urinary tract infection) 11/05/2020  . Precordial chest pain 04/14/2020  . Tachycardia 04/14/2020  . Educated about COVID-19 virus infection 04/14/2020  . Palliative care by specialist   . Generalized weakness 02/16/2020  . Hypothyroidism 02/16/2020  . Acute cystitis 02/16/2020  . Elevated BP without diagnosis of hypertension 02/16/2020  . Spondylolisthesis, lumbar region 02/20/2019  . TIA (transient ischemic attack) 06/25/2018  . CVA (cerebrovascular accident) ([place]) 06/24/2018    Class: Acute  . Lumbar herniated disc 04/12/2018  . Stage 4 chronic kidney disease ([place]) 02/19/2018  . Chronic venous insufficiency 02/19/2018  . Primary localized osteoarthritis of right knee 11/14/2016   PCP:  Burnard Bunting, MD Pharmacy:   [facility] [address], [place] [address] AT [place] [address] [place] [address] Phone: [phone] Fax:  [place], [place] [address] Phone: [phone] Fax: [phone]     Social Determinants of Health (SDOH) Interventions    Readmission Risk Interventions No flowsheet data found.

## 2020-11-07 NOTE — NC FL2 (Addendum)
[place] LEVEL OF CARE SCREENING TOOL     IDENTIFICATION  Patient Name: Carolyn Robertson Birthdate: 22-Dec-1945 Sex: female Admission Date (Current Location): 11/05/2020  [facility] and [place] Number:  Herbalist and Address:  [facility] [place]. [facility], [place] [address], [place], [place] [address]      Provider Number: [id number]  Attending Physician Name and Address:  Jonetta Osgood, MD  Relative Name and Phone Number:  Jacquelynn Cree [phone]    Current Level of Care: SNF Recommended Level of Care: [place] Prior Approval Number:    Date Approved/Denied:   PASRR Number: [id number]  Discharge Plan: SNF    Current Diagnoses: Patient Active Problem List   Diagnosis Date Noted  . COVID-19 virus infection 11/06/2020  . UTI (urinary tract infection) 11/05/2020  . Precordial chest pain 04/14/2020  . Tachycardia 04/14/2020  . Educated about COVID-19 virus infection 04/14/2020  . Palliative care by specialist   . Generalized weakness 02/16/2020  . Hypothyroidism 02/16/2020  . Acute cystitis 02/16/2020  . Elevated BP without diagnosis of hypertension 02/16/2020  . Spondylolisthesis, lumbar region 02/20/2019  . TIA (transient ischemic attack) 06/25/2018  . CVA (cerebrovascular accident) ([place]) 06/24/2018  . Lumbar herniated disc 04/12/2018  . Stage 4 chronic kidney disease ([place]) 02/19/2018  . Chronic venous insufficiency 02/19/2018  . Primary localized osteoarthritis of right knee 11/14/2016    Orientation RESPIRATION BLADDER Height & Weight     Self,Time,Situation,Place  Normal Incontinent Weight: 126 lb 15.8 oz (57.6 kg) Height:  5\' 4"  (162.6 cm)  BEHAVIORAL SYMPTOMS/MOOD NEUROLOGICAL BOWEL NUTRITION STATUS      Continent Diet  AMBULATORY STATUS COMMUNICATION OF NEEDS Skin   Extensive Assist Verbally Normal                       Personal Care Assistance Level of Assistance  Bathing,Feeding,Dressing  Bathing Assistance: Maximum assistance Feeding assistance: Maximum assistance Dressing Assistance: Maximum assistance     Functional Limitations Info  Sight,Hearing,Speech Sight Info: Adequate Hearing Info: Adequate Speech Info: Adequate    SPECIAL CARE FACTORS FREQUENCY  PT (By licensed PT),OT (By licensed OT)     PT Frequency: 5x per week OT Frequency: 5x per week            Contractures Contractures Info: Not present    Additional Factors Info  Code Status  Allergies Psychotropic Isolation Code Status Info: full code  Allergies Info: Other, Penicillins, Sulfa Antibiotics, Codeine, Hydrocodone-acetaminophen, Naproxen Sodium, Tape  Psychotropic Info: Cymbalta Isolation Info: COVID+           Current Medications (11/07/2020):  This is the current hospital active medication list Current Facility-Administered Medications  Medication Dose Route Frequency Provider Last Rate Last Admin  . 0.9 %  sodium chloride infusion   Intravenous Continuous Mansy, Jan A, MD 100 mL/hr at 11/06/20 2349 New Bag at 11/06/20 2349  . acetaminophen (TYLENOL) tablet 650 mg  650 mg Oral Q6H PRN Mansy, Jan A, MD       Or  . acetaminophen (TYLENOL) suppository 650 mg  650 mg Rectal Q6H PRN Mansy, Jan A, MD      . ascorbic acid (VITAMIN C) tablet 500 mg  500 mg Oral Daily Mansy, Jan A, MD   500 mg at 11/07/20 0946  . aspirin EC tablet 81 mg  81 mg Oral Daily Mansy, Jan A, MD   81 mg at 11/07/20 0945  . baclofen (LIORESAL) tablet  10 mg  10 mg Oral BID PRN Mansy, Jan A, MD      . cefTRIAXone (ROCEPHIN) 1 g in sodium chloride 0.9 % 100 mL IVPB  1 g Intravenous Q24H Mansy, Arvella Merles, MD   Stopped at 11/06/20 2300  . cholecalciferol (VITAMIN D3) tablet 1,000 Units  1,000 Units Oral Daily Mansy, Arvella Merles, MD   1,000 Units at 11/07/20 0946  . [START ON 11/17/2020] cyanocobalamin ((VITAMIN B-12)) injection 1,000 mcg  1,000 mcg Subcutaneous Q28 days Mansy, Jan A, MD      . DULoxetine (CYMBALTA) DR capsule 60 mg   60 mg Oral QHS Mansy, Jan A, MD   60 mg at 11/06/20 2219  . enoxaparin (LOVENOX) injection 30 mg  30 mg Subcutaneous Q24H Mansy, Jan A, MD   30 mg at 11/06/20 1155  . estrogen (conjugated)-medroxyprogesterone (PREMPRO) 0.625-2.5 MG per tablet 1 tablet  1 tablet Oral Daily Mansy, Jan A, MD      . famotidine (PEPCID) tablet 20 mg  20 mg Oral Daily Jonetta Osgood, MD   20 mg at 11/07/20 0945  . febuxostat (ULORIC) tablet 40 mg  40 mg Oral QHS Mansy, Jan A, MD   40 mg at 11/06/20 2219  . gabapentin (NEURONTIN) capsule 100 mg  100 mg Oral Daily Mansy, Jan A, MD   100 mg at 11/07/20 0945  . HYDROcodone-acetaminophen (NORCO/VICODIN) 5-325 MG per tablet 1 tablet  1 tablet Oral Q6H PRN Mansy, Arvella Merles, MD   1 tablet at 11/06/20 2344  . levothyroxine (SYNTHROID) tablet 175 mcg  175 mcg Oral Q0600 Mansy, Jan A, MD   175 mcg at 11/07/20 0522  . magnesium hydroxide (MILK OF MAGNESIA) suspension 30 mL  30 mL Oral Daily PRN Mansy, Jan A, MD      . ondansetron [facility]) tablet 4 mg  4 mg Oral Q6H PRN Mansy, Jan A, MD       Or  . ondansetron [facility]) injection 4 mg  4 mg Intravenous Q6H PRN Mansy, Jan A, MD      . pantoprazole (PROTONIX) EC tablet 40 mg  40 mg Oral QAC breakfast Mansy, Jan A, MD   40 mg at 11/07/20 0946  . potassium chloride SA (KLOR-CON) CR tablet 40 mEq  40 mEq Oral Q6H Jonetta Osgood, MD   40 mEq at 11/07/20 0946  . remdesivir 100 mg in sodium chloride 0.9 % 100 mL IVPB  100 mg Intravenous Daily Jonetta Osgood, MD 200 mL/hr at 11/07/20 0944 100 mg at 11/07/20 0944  . traZODone (DESYREL) tablet 25 mg  25 mg Oral QHS PRN Mansy, Jan A, MD      . zinc sulfate capsule 220 mg  220 mg Oral Daily Mansy, Jan A, MD   220 mg at 11/07/20 0945     Discharge Medications: Please see discharge summary for a list of discharge medications.  Relevant Imaging Results:  Relevant Lab Results:   Additional Information SSN [id number]  Elliot Gurney [place], [place]

## 2020-11-07 NOTE — TOC Progression Note (Signed)
Transition of Care [facility]) - Progression Note    Patient Details  Name: Carolyn Robertson MRN: [id number] Date of Birth: 02/29/1946  Transition of [facility]) CM/SW Contact  Elliot Gurney [place], [place] Phone Number: [phone] 11/07/2020, 12:31 PM  Clinical Narrative:    Return call to patient's spouse to discuss SNF recommendation. Per patient's spouse, he is not able to provide care for her if discharged home. Patient's spouse requesting discharge to [facility] and request that she not discharge to any other facility under the 4 star rating if Ritta Slot is not available. SNF placement process reviewed. Fl2 completed, PASRR requesting additional information and will provided.   Transition of Care team will follow up with patient and spouse to provide bed offers.  [address], LCSW Transition of Car [phone]    Expected Discharge Plan: [place] Barriers to Discharge: Continued Medical Work up  Expected Discharge Plan and Services Expected Discharge Plan: [place] arrangements for the past 2 months: Single Family Home                                       Social Determinants of Health (SDOH) Interventions    Readmission Risk Interventions No flowsheet data found.

## 2020-11-07 NOTE — Progress Notes (Signed)
Patient transferred to unit around 2300. VSS. Remains on 2L. AOx2-3. Call bell within reach. Bed alarm on. No concerns at the moment.

## 2020-11-07 NOTE — Plan of Care (Signed)
Patient is currently resting in bed. C/o back pain, given norco PRN. Repositioned. VSS. Currently on room air. [place] in place. MIVF. No complaints at this time. Call bell within reach. Bed alarm on.   Problem: Education: Goal: Knowledge of risk factors and measures for prevention of condition will improve Outcome: Progressing   Problem: Coping: Goal: Psychosocial and spiritual needs will be supported Outcome: Progressing   Problem: Respiratory: Goal: Will maintain a patent airway Outcome: Progressing Goal: Complications related to the disease process, condition or treatment will be avoided or minimized Outcome: Progressing

## 2020-11-08 DIAGNOSIS — N3000 Acute cystitis without hematuria: Secondary | ICD-10-CM | POA: Diagnosis not present

## 2020-11-08 DIAGNOSIS — U071 COVID-19: Secondary | ICD-10-CM | POA: Diagnosis not present

## 2020-11-08 DIAGNOSIS — R5381 Other malaise: Secondary | ICD-10-CM | POA: Diagnosis not present

## 2020-11-08 DIAGNOSIS — N179 Acute kidney failure, unspecified: Secondary | ICD-10-CM | POA: Diagnosis not present

## 2020-11-08 LAB — CBC WITH DIFFERENTIAL/PLATELET
Abs Immature Granulocytes: 0.08 10*3/uL — ABNORMAL HIGH (ref 0.00–0.07)
Basophils Absolute: 0 10*3/uL (ref 0.0–0.1)
Basophils Relative: 1 %
Eosinophils Absolute: 0.1 10*3/uL (ref 0.0–0.5)
Eosinophils Relative: 2 %
HCT: 38.6 % (ref 36.0–46.0)
Hemoglobin: 11.8 g/dL — ABNORMAL LOW (ref 12.0–15.0)
Immature Granulocytes: 1 %
Lymphocytes Relative: 23 %
Lymphs Abs: 1.8 10*3/uL (ref 0.7–4.0)
MCH: 26.3 pg (ref 26.0–34.0)
MCHC: 30.6 g/dL (ref 30.0–36.0)
MCV: 86.2 fL (ref 80.0–100.0)
Monocytes Absolute: 0.7 10*3/uL (ref 0.1–1.0)
Monocytes Relative: 10 %
Neutro Abs: 5 10*3/uL (ref 1.7–7.7)
Neutrophils Relative %: 63 %
Platelets: 309 10*3/uL (ref 150–400)
RBC: 4.48 MIL/uL (ref 3.87–5.11)
RDW: 18.9 % — ABNORMAL HIGH (ref 11.5–15.5)
WBC: 7.7 10*3/uL (ref 4.0–10.5)
nRBC: 0 % (ref 0.0–0.2)

## 2020-11-08 LAB — COMPREHENSIVE METABOLIC PANEL
ALT: 19 U/L (ref 0–44)
AST: 51 U/L — ABNORMAL HIGH (ref 15–41)
Albumin: 2.3 g/dL — ABNORMAL LOW (ref 3.5–5.0)
Alkaline Phosphatase: 73 U/L (ref 38–126)
Anion gap: 10 (ref 5–15)
BUN: 19 mg/dL (ref 8–23)
CO2: 18 mmol/L — ABNORMAL LOW (ref 22–32)
Calcium: 8.1 mg/dL — ABNORMAL LOW (ref 8.9–10.3)
Chloride: 108 mmol/L (ref 98–111)
Creatinine, Ser: 2.01 mg/dL — ABNORMAL HIGH (ref 0.44–1.00)
GFR, Estimated: 25 mL/min — ABNORMAL LOW (ref >60)
Glucose, Bld: 93 mg/dL (ref 70–99)
Potassium: 4.3 mmol/L (ref 3.5–5.1)
Sodium: 136 mmol/L (ref 135–145)
Total Bilirubin: 0.4 mg/dL (ref 0.3–1.2)
Total Protein: 4.6 g/dL — ABNORMAL LOW (ref 6.5–8.1)

## 2020-11-08 LAB — D-DIMER, QUANTITATIVE: D-Dimer, Quant: 2.82 ug/mL-FEU — ABNORMAL HIGH (ref 0.00–0.50)

## 2020-11-08 LAB — T3, FREE: T3, Free: 1.1 pg/mL — ABNORMAL LOW (ref 2.0–4.4)

## 2020-11-08 LAB — URINE CULTURE: Culture: 70000 — AB

## 2020-11-08 LAB — C-REACTIVE PROTEIN: CRP: 4.2 mg/dL — ABNORMAL HIGH (ref <1.0)

## 2020-11-08 NOTE — Progress Notes (Signed)
PROGRESS NOTE                                                                                                                                                                                                             Patient Demographics:    Carolyn Robertson, is a 75 y.o. female, DOB - [birth year], UYQ:[id number]  Outpatient Primary MD for the patient is Burnard Bunting, MD   Admit date - 11/05/2020   LOS - 3  Chief Complaint  Patient presents with  . Weakness  . Failure To Thrive       Brief Narrative: Patient is a 75 y.o. female with PMHx of hypothyroidism, stage IV CKD, CVA, asthma-presented with generalized weakness, dysuria and frequent falls.  Found to have UTI and breakthrough COVID-19 infection.  See below for further details.  COVID-19 vaccinated status: Vaccinated including booster.  Significant Events: 1/20>> Admit to [facility] for weakness/falls-with UTI and breakthrough COVID-19 infection.  Significant studies: 1/20>Chest x-ray: No active disease  COVID-19 medications: Remdesivir: 1/20>>  Antibiotics: Rocephin: 1/20>>  Microbiology data: 1/20 >> urine culture: Proteus mirabilis  Procedures: None  Consults: None  DVT prophylaxis: enoxaparin (LOVENOX) injection 30 mg Start: 11/06/20 1000    Subjective:   Awake/alert-lying comfortably in bed-no chest pain or shortness of breath.    Assessment  & Plan :   Sepsis secondary to Proteus UTI: Sepsis physiology has resolved-urine culture growing Proteus-we will plan on 5 days of IV Rocephin.      AKI on CKD stage IV: AKI hemodynamically mediated-improved with IV fluids-creatinine close to baseline.  Continue to avoid nephrotoxic agents.   Breakthrough COVID-19 infection: Respiratory symptoms-no hypoxia-chest x-ray negative-planned 3-day course of Remdesivir   Fever: afebrile O2 requirements:  SpO2: 97 % O2 Flow Rate (L/min): 2 L/min    COVID-19 Labs: Recent Labs    11/06/20 0003 11/06/20 0249 11/07/20 0432 11/08/20 0115  DDIMER 3.00* 3.19* 2.96* 2.82*  FERRITIN 24  --   --   --   LDH 258*  --   --   --   CRP 1.0* 1.2* 3.0* 4.2*       Component Value Date/Time   BNP 90.3 11/06/2020 0003    Recent Labs  Lab 11/06/20 0003 11/06/20 0249  PROCALCITON 0.30 0.29    Lab Results  Component Value Date   SARSCOV2NAA POSITIVE (  A) 11/05/2020   [place] NEGATIVE 02/16/2020   SARSCOV2NAA NOT DETECTED 03/26/2019    HTN: BP stable-antihypertensives on hold-we will resume when able over the next few days.  Hypothyroidism: TSH 227!  She likely has not taken Synthroid for the past several months-seems to be tolerating levothyroxine at 175 mcg/day-we will watch closely for the next few days-and slowly increase her regimen.  As noted above-clinically she does not appear to have myxedema.  We will need repeat TSH in 3 months.   Hypokalemia: Repleted.  GERD: Continue PPI  Depression with anxiety: Stable-continue Cymbalta/mirtazapine  Gout: Continue Febuxostat  Debility/deconditioning/frequent falls: Due to acute illness with COVID/UTI-PT with recommendations for SNF.  Social worker following.     GI prophylaxis: PPI  ABG:    Component Value Date/Time   TCO2 26 03/26/2019 1449    Vent Settings: N/A    Condition - Guarded  Family Communication  :  Spouse-Hubert-[phone]-called on 1/23-no response-able to leave voicemail.  Code Status :  Full Code  Diet :  Diet Order            Diet Heart Room service appropriate? Yes; Fluid consistency: Thin  Diet effective now                  Disposition Plan  :   Status is: Inpatient  Remains inpatient appropriate because:Inpatient level of care appropriate due to severity of illness   Dispo: The patient is from: Home              Anticipated d/c is to: SNF.              Anticipated d/c date is: > 3 days              Patient currently is not  medically stable to d/c.   Barriers to discharge: Complete3 days of IV Remdesivir/on IV Rocephin-severe debility-needs SNF per PT recommendation.  Antimicorbials  :    Anti-infectives (From admission, onward)   Start     Dose/Rate Route Frequency Ordered Stop   11/07/20 1000  remdesivir 100 mg in sodium chloride 0.9 % 100 mL IVPB       "Followed by" Linked Group Details   100 mg 200 mL/hr over 30 Minutes Intravenous Daily 11/05/20 2342 11/09/20 0959   11/06/20 2200  cefTRIAXone (ROCEPHIN) 1 g in sodium chloride 0.9 % 100 mL IVPB        1 g 200 mL/hr over 30 Minutes Intravenous Every 24 hours 11/05/20 2315     11/06/20 0100  remdesivir 200 mg in sodium chloride 0.9% 250 mL IVPB       "Followed by" Linked Group Details   200 mg 580 mL/hr over 30 Minutes Intravenous Once 11/05/20 2342 11/06/20 0128   11/05/20 2145  cefTRIAXone (ROCEPHIN) 1 g in sodium chloride 0.9 % 100 mL IVPB        1 g 200 mL/hr over 30 Minutes Intravenous  Once 11/05/20 2138 11/05/20 2217      Inpatient Medications  Scheduled Meds: . vitamin C  500 mg Oral Daily  . aspirin EC  81 mg Oral Daily  . cholecalciferol  1,000 Units Oral Daily  . [START ON 11/17/2020] cyanocobalamin  1,000 mcg Subcutaneous Q28 days  . DULoxetine  60 mg Oral QHS  . enoxaparin (LOVENOX) injection  30 mg Subcutaneous Q24H  . estrogen (conjugated)-medroxyprogesterone  1 tablet Oral Daily  . famotidine  20 mg Oral Daily  . febuxostat  40 mg Oral  QHS  . gabapentin  100 mg Oral Daily  . levothyroxine  175 mcg Oral Q0600  . pantoprazole  40 mg Oral QAC breakfast  . zinc sulfate  220 mg Oral Daily   Continuous Infusions: . cefTRIAXone (ROCEPHIN)  IV 1 g (11/07/20 2054)  . remdesivir 100 mg in NS 100 mL 100 mg (11/07/20 0944)   PRN Meds:.acetaminophen **OR** acetaminophen, baclofen, HYDROcodone-acetaminophen, loperamide, magnesium hydroxide, ondansetron **OR** ondansetron (ZOFRAN) IV, traZODone   Time Spent in minutes  25    See all  Orders from today for further details   Oren Binet M.D on 11/08/2020 at 11:02 AM  To page go to [email or web address] - use universal password  [facility] -  Office  [phone]    Objective:   Vitals:   11/07/20 2047 11/08/20 0006 11/08/20 0431 11/08/20 0700  BP: 140/84 118/84 (!) 144/73 (!) 144/80  Pulse: 78 70 86 78  Resp: 18 17 20 20   Temp: 97.8 F (36.6 C) 98 F (36.7 C) 97.9 F (36.6 C) 98 F (36.7 C)  TempSrc: Oral Oral Oral Oral  SpO2: 100% 97% 96% 97%  Weight:      Height:        Wt Readings from Last 3 Encounters:  11/05/20 57.6 kg  04/24/20 57.6 kg  04/15/20 58 kg     Intake/Output Summary (Last 24 hours) at 11/08/2020 1102 Last data filed at 11/08/2020 0847 Gross per 24 hour  Intake 120 ml  Output 600 ml  Net -480 ml     Physical Exam Gen Exam:Alert awake-not in any distress HEENT:atraumatic, normocephalic Chest: B/L clear to auscultation anteriorly CVS:S1S2 regular Abdomen:soft non tender, non distended Extremities:no edema Neurology: Non focal Skin: no rash   Data Review:    CBC Recent Labs  Lab 11/05/20 1707 11/06/20 0249 11/07/20 0432 11/08/20 0115  WBC 15.0* 11.4* 9.0 7.7  HGB 13.1 12.0 11.5* 11.8*  HCT 41.6 39.3 37.4 38.6  PLT 422* 345 328 309  MCV 85.2 86.6 86.4 86.2  MCH 26.8 26.4 26.6 26.3  MCHC 31.5 30.5 30.7 30.6  RDW 18.4* 18.4* 18.4* 18.9*  LYMPHSABS 0.8 1.2 1.5 1.8  MONOABS 0.9 0.9 1.0 0.7  EOSABS 0.2 0.1 0.1 0.1  BASOSABS 0.1 0.1 0.1 0.0    Chemistries  Recent Labs  Lab 11/05/20 1707 11/06/20 0249 11/07/20 0432 11/08/20 0115  NA 134* 135 136 136  K 3.7 3.5 3.1* 4.3  CL 100 103 105 108  CO2 21* 19* 19* 18*  GLUCOSE 105* 95 80 93  BUN 24* 21 21 19   CREATININE 2.51* 2.25* 2.28* 2.01*  CALCIUM 9.1 8.1* 7.7* 8.1*  MG 1.8  --   --   --   AST 32 32 49* 51*  ALT 17 16 17 19   ALKPHOS 83 78 71 73  BILITOT 0.6 0.6 0.4 0.4    ------------------------------------------------------------------------------------------------------------------ No results for input(s): CHOL, HDL, LDLCALC, TRIG, CHOLHDL, LDLDIRECT in the last 72 hours.  Lab Results  Component Value Date   HGBA1C 5.5 06/25/2018   ------------------------------------------------------------------------------------------------------------------ Recent Labs    11/06/20 0003 11/06/20 2313  TSH 227.067*  --   T3FREE  --  1.1*   ------------------------------------------------------------------------------------------------------------------ Recent Labs    11/06/20 0003  FERRITIN 24    Coagulation profile Recent Labs  Lab 11/06/20 0249  INR 1.2    Recent Labs    11/07/20 0432 11/08/20 0115  DDIMER 2.96* 2.82*    Cardiac Enzymes No results for input(s): CKMB, TROPONINI, MYOGLOBIN in the  last 168 hours.  Invalid input(s): CK ------------------------------------------------------------------------------------------------------------------    Component Value Date/Time   BNP 90.3 11/06/2020 0003    Micro Results Recent Results (from the past 240 hour(s))  Culture, Urine     Status: Abnormal   Collection Time: 11/05/20  2:15 AM   Specimen: Urine, Random  Result Value Ref Range Status   Specimen Description URINE, RANDOM  Final   Special Requests   Final    NONE Performed at [facility], [address], [place], [place] [address]    Culture 70,000 COLONIES/mL PROTEUS MIRABILIS (A)  Final   Report Status 11/08/2020 FINAL  Final   Organism ID, Bacteria PROTEUS MIRABILIS (A)  Final      Susceptibility   Proteus mirabilis - MIC*    AMPICILLIN <=2 SENSITIVE Sensitive     CEFAZOLIN 8 SENSITIVE Sensitive     CEFEPIME <=0.12 SENSITIVE Sensitive     CEFTRIAXONE <=0.25 SENSITIVE Sensitive     CIPROFLOXACIN <=0.25 SENSITIVE Sensitive     GENTAMICIN <=1 SENSITIVE Sensitive     IMIPENEM 4 SENSITIVE Sensitive      NITROFURANTOIN 128 RESISTANT Resistant     TRIMETH/SULFA <=20 SENSITIVE Sensitive     AMPICILLIN/SULBACTAM <=2 SENSITIVE Sensitive     PIP/TAZO <=4 SENSITIVE Sensitive     * 70,000 COLONIES/mL PROTEUS MIRABILIS  SARS CORONAVIRUS 2 (TAT 6-24 HRS) Nasopharyngeal Nasopharyngeal Swab     Status: Abnormal   Collection Time: 11/05/20  5:08 PM   Specimen: Nasopharyngeal Swab  Result Value Ref Range Status   SARS Coronavirus 2 POSITIVE (A) NEGATIVE Final    Comment: (NOTE) SARS-CoV-2 target nucleic acids are DETECTED.  The SARS-CoV-2 RNA is generally detectable in upper and lower respiratory specimens during the acute phase of infection. Positive results are indicative of the presence of SARS-CoV-2 RNA. Clinical correlation with patient history and other diagnostic information is  necessary to determine patient infection status. Positive results do not rule out bacterial infection or co-infection with other viruses.  The expected result is Negative.  Fact Sheet for Patients: SugarRoll.be  Fact Sheet for Healthcare Providers: https://www.woods-mathews.com/  This test is not yet approved or cleared by the [place] FDA and  has been authorized for detection and/or diagnosis of SARS-CoV-2 by FDA under an Emergency Use Authorization (EUA). This EUA will remain  in effect (meaning this test can be used) for the duration of the COVID-19 declaration under Section 564(b)(1) of the Act, 21 U. S.C. section 360bbb-3(b)(1), unless the authorization is terminated or revoked sooner.   Performed at [facility], [place] [address] [place], [place] [address]     Radiology Reports DG Chest [place] 1 View  Result Date: 11/05/2020 CLINICAL DATA:  General weakness EXAM: PORTABLE CHEST 1 VIEW COMPARISON:  None. FINDINGS: Mildly diminished lung volumes. No consolidation or effusion. Normal cardiomediastinal silhouette. No pneumothorax. Opacity over the right  lower lung felt secondary to healing right seventh posterior rib fracture. IMPRESSION: No active disease. Probable healing right seventh posterior rib fracture. Electronically Signed   By: Donavan Foil M.D.   On: 11/05/2020 17:58

## 2020-11-08 NOTE — Plan of Care (Signed)
Patient is resting in bed. VSS. Remains on room air. Had two loose BMs yesterday, given imodium PRN. Purewick in place. No complaints overnight. Call bell within reach. Bed alarm on.   Problem: Education: Goal: Knowledge of risk factors and measures for prevention of condition will improve Outcome: Progressing   Problem: Coping: Goal: Psychosocial and spiritual needs will be supported Outcome: Progressing   Problem: Respiratory: Goal: Will maintain a patent airway Outcome: Progressing Goal: Complications related to the disease process, condition or treatment will be avoided or minimized Outcome: Progressing

## 2020-11-09 DIAGNOSIS — N3000 Acute cystitis without hematuria: Secondary | ICD-10-CM | POA: Diagnosis not present

## 2020-11-09 DIAGNOSIS — N179 Acute kidney failure, unspecified: Secondary | ICD-10-CM | POA: Diagnosis not present

## 2020-11-09 DIAGNOSIS — R5381 Other malaise: Secondary | ICD-10-CM | POA: Diagnosis not present

## 2020-11-09 DIAGNOSIS — U071 COVID-19: Secondary | ICD-10-CM | POA: Diagnosis not present

## 2020-11-09 LAB — COMPREHENSIVE METABOLIC PANEL
ALT: 17 U/L (ref 0–44)
AST: 40 U/L (ref 15–41)
Albumin: 2.2 g/dL — ABNORMAL LOW (ref 3.5–5.0)
Alkaline Phosphatase: 70 U/L (ref 38–126)
Anion gap: 9 (ref 5–15)
BUN: 16 mg/dL (ref 8–23)
CO2: 19 mmol/L — ABNORMAL LOW (ref 22–32)
Calcium: 8.1 mg/dL — ABNORMAL LOW (ref 8.9–10.3)
Chloride: 107 mmol/L (ref 98–111)
Creatinine, Ser: 1.98 mg/dL — ABNORMAL HIGH (ref 0.44–1.00)
GFR, Estimated: 26 mL/min — ABNORMAL LOW (ref >60)
Glucose, Bld: 85 mg/dL (ref 70–99)
Potassium: 3.9 mmol/L (ref 3.5–5.1)
Sodium: 135 mmol/L (ref 135–145)
Total Bilirubin: 0.4 mg/dL (ref 0.3–1.2)
Total Protein: 4.6 g/dL — ABNORMAL LOW (ref 6.5–8.1)

## 2020-11-09 LAB — CBC WITH DIFFERENTIAL/PLATELET
Abs Immature Granulocytes: 0.07 10*3/uL (ref 0.00–0.07)
Basophils Absolute: 0 10*3/uL (ref 0.0–0.1)
Basophils Relative: 1 %
Eosinophils Absolute: 0.3 10*3/uL (ref 0.0–0.5)
Eosinophils Relative: 4 %
HCT: 37.6 % (ref 36.0–46.0)
Hemoglobin: 11.7 g/dL — ABNORMAL LOW (ref 12.0–15.0)
Immature Granulocytes: 1 %
Lymphocytes Relative: 32 %
Lymphs Abs: 2.2 10*3/uL (ref 0.7–4.0)
MCH: 26.7 pg (ref 26.0–34.0)
MCHC: 31.1 g/dL (ref 30.0–36.0)
MCV: 85.8 fL (ref 80.0–100.0)
Monocytes Absolute: 0.5 10*3/uL (ref 0.1–1.0)
Monocytes Relative: 7 %
Neutro Abs: 3.8 10*3/uL (ref 1.7–7.7)
Neutrophils Relative %: 55 %
Platelets: 320 10*3/uL (ref 150–400)
RBC: 4.38 MIL/uL (ref 3.87–5.11)
RDW: 19.2 % — ABNORMAL HIGH (ref 11.5–15.5)
WBC: 6.8 10*3/uL (ref 4.0–10.5)
nRBC: 0 % (ref 0.0–0.2)

## 2020-11-09 LAB — C-REACTIVE PROTEIN: CRP: 3 mg/dL — ABNORMAL HIGH (ref <1.0)

## 2020-11-09 LAB — D-DIMER, QUANTITATIVE: D-Dimer, Quant: 2.7 ug/mL-FEU — ABNORMAL HIGH (ref 0.00–0.50)

## 2020-11-09 IMAGING — CT CT ABD-PELV W/O CM
2 of 4 series · 14 of 46 positions shown, 16 images · non-contrast
Comparison: None.

CLINICAL DATA: RIGHT-sided pain.  30 pound weight loss.

EXAM:
CT ABDOMEN AND PELVIS WITHOUT CONTRAST
TECHNIQUE: Multidetector CT imaging of the abdomen and pelvis was performed
following the standard protocol without IV contrast.

[Series 2: routine abdomen pelvis without 5.00 br40 s3 axial · axial · non-contrast · 0.56mm/px · z∈[+1421,+1826]mm · 11 of 97 slices shown, 13 images]
[im 8/97  soft-tissue]
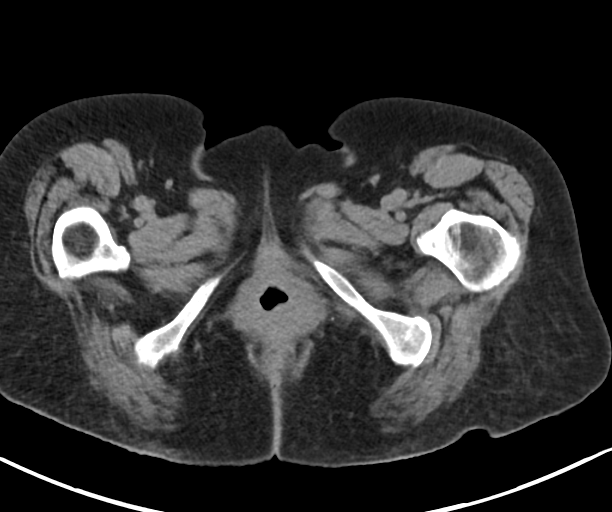
[im 8/97  bone]
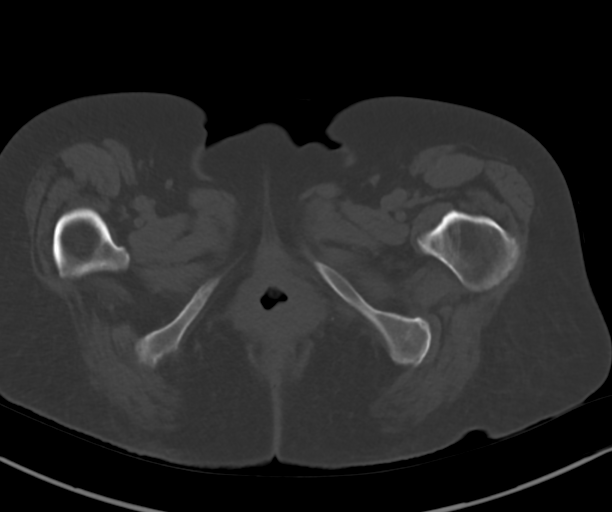
[im 16/97  soft-tissue]
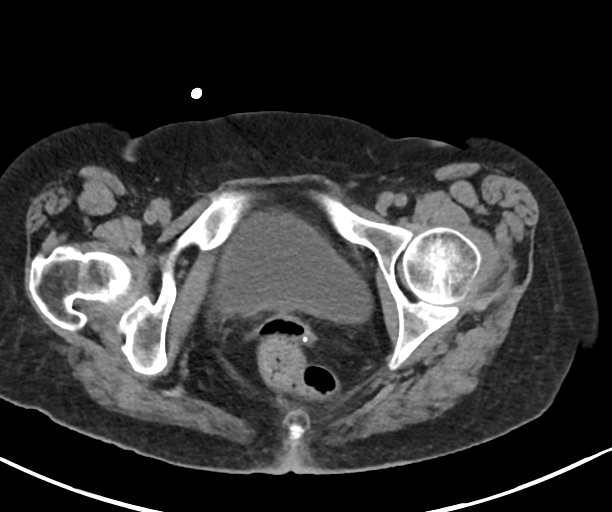
[im 24/97  soft-tissue]
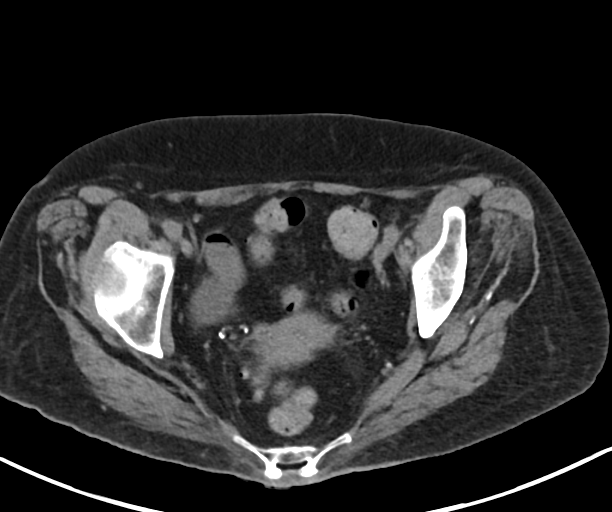
[im 31/97  soft-tissue]
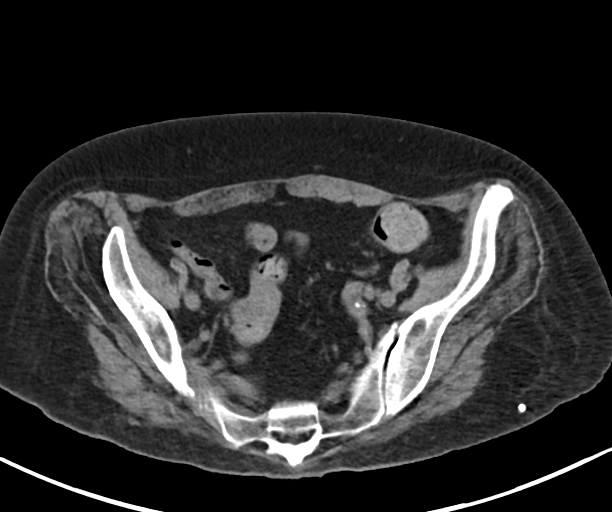
[im 39/97  soft-tissue]
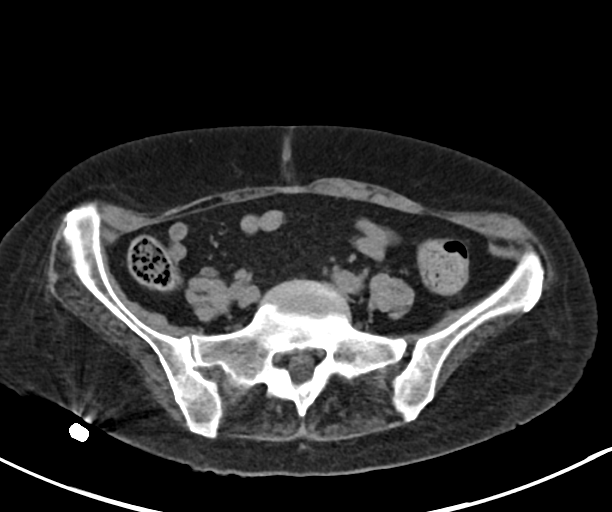
[im 50/97  soft-tissue]
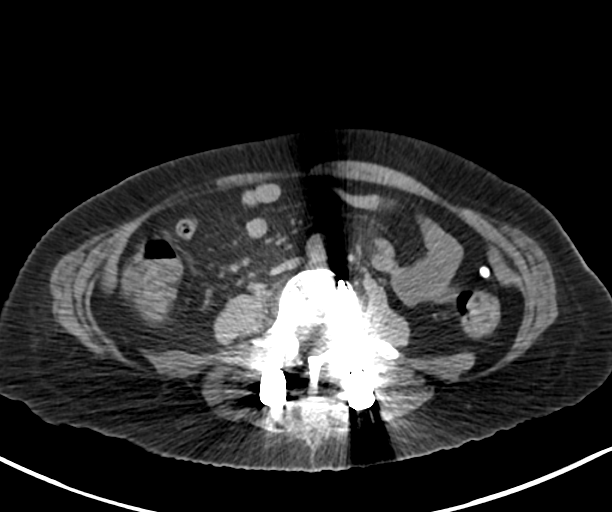
[im 58/97  soft-tissue]
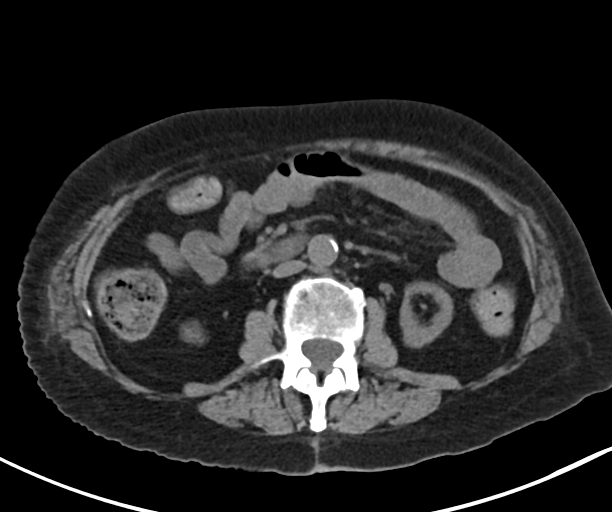
[im 66/97  soft-tissue]
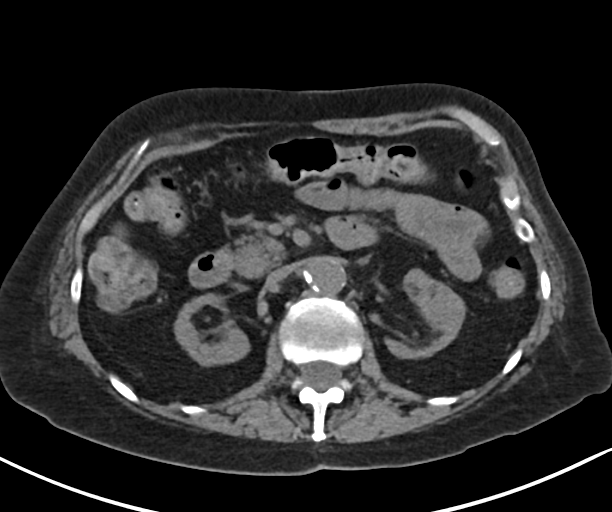
[im 73/97  soft-tissue]
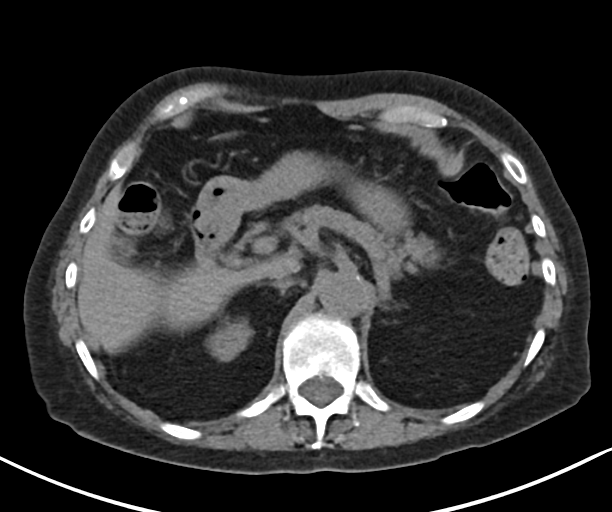
[im 73/97  bone]
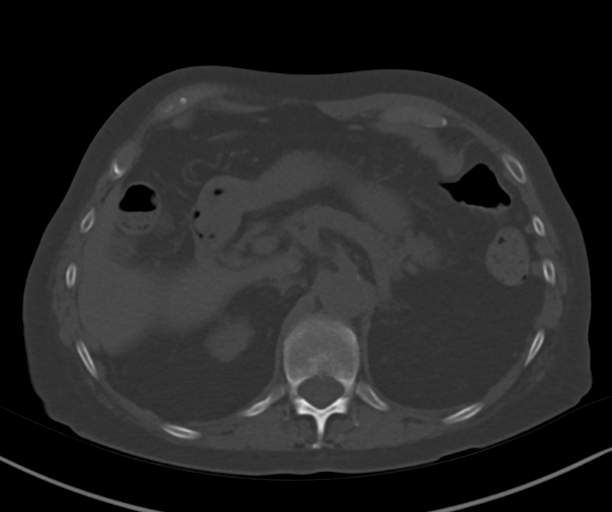
[im 81/97  soft-tissue]
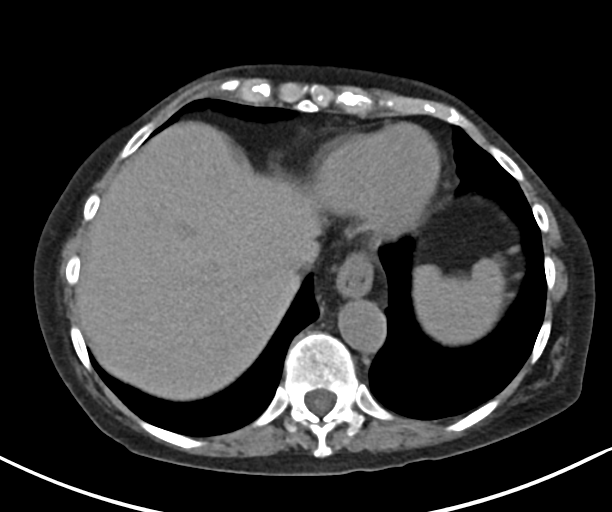
[im 89/97  soft-tissue]
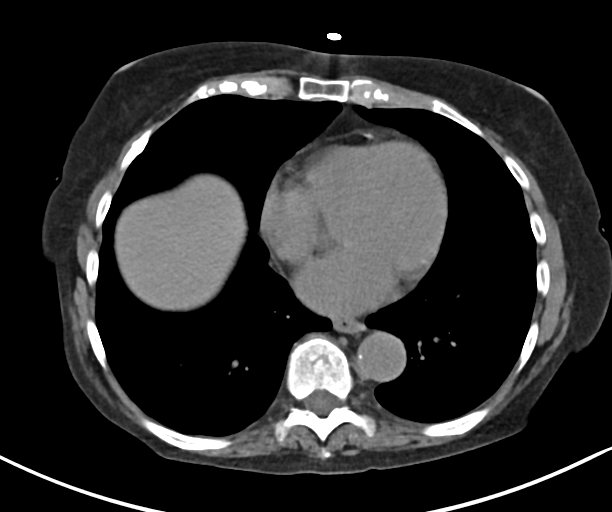

[Series 4: routine abdomen pelvis without 2.00 br40 s3 cor · coronal · non-contrast · 0.66mm/px · 3 of 142 slices shown]
[im 48/142  soft-tissue]
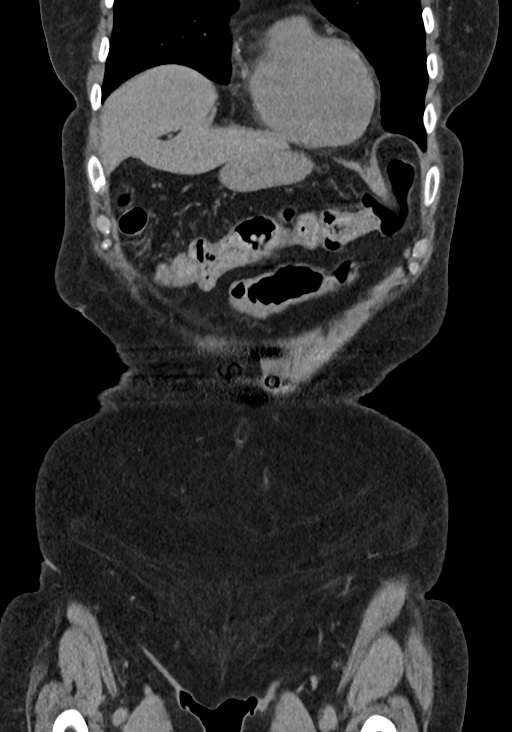
[im 63/142  soft-tissue]
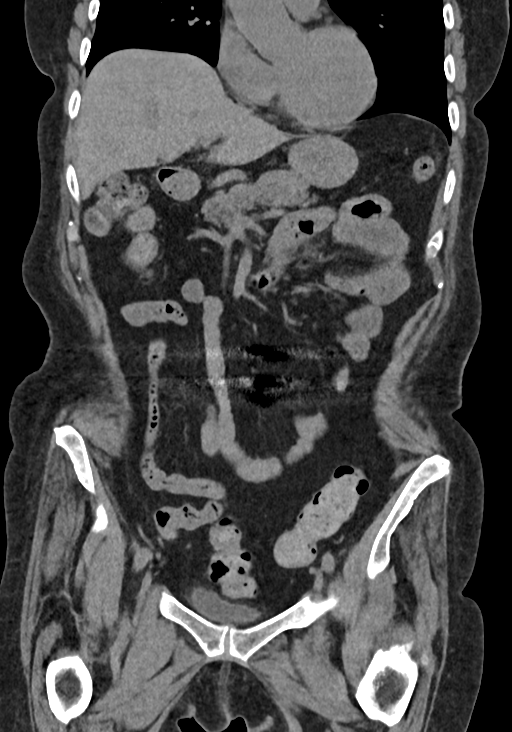
[im 79/142  soft-tissue]
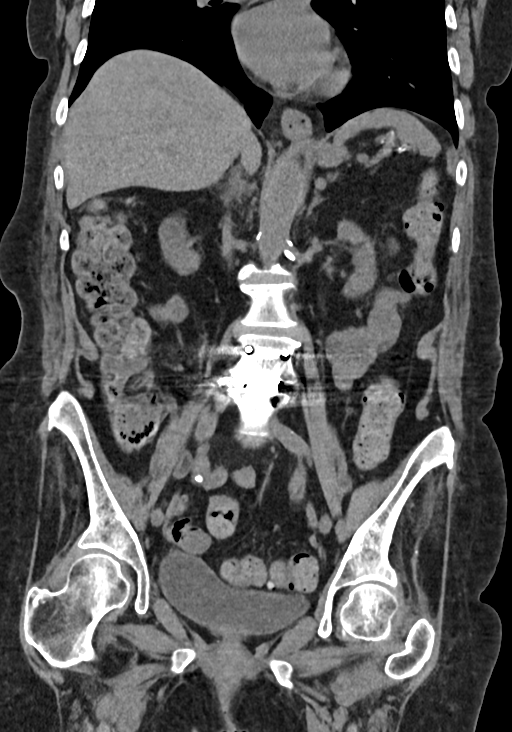

[14 of 46 positions shown; findings below may reference images not displayed]

FINDINGS: Lower chest: Lung bases are clear.

Hepatobiliary: No focal hepatic lesion. No biliary duct dilatation.
Gallbladder is normal. Common bile duct is normal.

Pancreas: Pancreas is normal. No ductal dilatation. No pancreatic
inflammation.

Spleen: Normal spleen

Adrenals/urinary tract: Adrenal glands normal. Bilateral renal
cortical thinning. No hydronephrosis. Obstructive uropathy. Bladder
normal.

Stomach/Bowel: Stomach, small bowel, appendix, and cecum are normal.
The colon and rectosigmoid colon are normal.

Vascular/Lymphatic: Abdominal aorta is normal caliber with
atherosclerotic calcification. There is no retroperitoneal or
periportal lymphadenopathy. No pelvic lymphadenopathy.

Reproductive: Small volume of gas within lower uterine segment seen
on sagittal image 83/6 and image 77/6). Adnexa unremarkable.

Other: No free fluid.

Musculoskeletal: No aggressive osseous lesion.
IMPRESSION: 1. Bilateral renal cortical thinning.  No obstructive uropathy.
2. Normal appendix.
3. Small volume gas within lower uterine segment of unclear
etiology. Consider pelvic exam to exclude infection.

## 2020-11-09 NOTE — Progress Notes (Signed)
Occupational Therapy Evaluation Patient Details Name: Carolyn Robertson MRN: [id number] DOB: 11/19/1945 Today's Date: 11/09/2020    History of Present Illness Pt is a 75 y/o female admitted secondary to weakness and failure to thrive. Found to be COVID +. Also with UTI. PMH includes memory loss, CVA, HTN, and asthma.   Clinical Impression   Pt from home where she lived with her husband, who assisted with ADL tasks. Unclear what assistance pt required for mobility as she states she just recently started using a RW. Pt will benefit from rehab at SNF. Will follow acutely. Requires Max A +2 with use of Stedy to mobilize to chair. VSS on RA during session.     Follow Up Recommendations  SNF;Supervision/Assistance - 24 hour    Equipment Recommendations  3 in 1 bedside commode    Recommendations for Other Services       Precautions / Restrictions Precautions Precautions: Fall Precaution Comments: fragile skin      Mobility Bed Mobility Overal bed mobility: Needs Assistance       Supine to sit: Max assist          Transfers Overall transfer level: Needs assistance   Transfers: Sit to/from Stand Sit to Stand: Max assist;+2 physical assistance              Balance Overall balance assessment: Needs assistance   Sitting balance-Leahy Scale: Poor       Standing balance-Leahy Scale: Zero                             ADL either performed or assessed with clinical judgement   ADL Overall ADL's : Needs assistance/impaired Eating/Feeding: Set up   Grooming: Minimal assistance;Sitting   Upper Body Bathing: Minimal assistance;Sitting   Lower Body Bathing: Maximal assistance;Sit to/from stand   Upper Body Dressing : Moderate assistance;Sitting   Lower Body Dressing: Maximal assistance;Sit to/from stand   Toilet Transfer: +2 for physical assistance;Maximal assistance   Toileting- Clothing Manipulation and Hygiene: Maximal assistance Toileting -  Clothing Manipulation Details (indicate cue type and reason): using purewick     Functional mobility during ADLs: Maximal assistance;+2 for physical assistance (use of Stedy)       Vision   Additional Comments: will further assess     Perception     Praxis Praxis Praxis tested?: Deficits Deficits: Perseveration;Organization    Pertinent Vitals/Pain Pain Assessment: Faces Faces Pain Scale: Hurts little more Pain Location: generalized Pain Descriptors / Indicators: Discomfort;Grimacing;Guarding Pain Intervention(s): Limited activity within patient's tolerance     Hand Dominance Right   Extremity/Trunk Assessment Upper Extremity Assessment Upper Extremity Assessment: Generalized weakness;RUE deficits/detail RUE Deficits / Details: R UE weakness from previous CVA but using functionally RUE Coordination: decreased fine motor   Lower Extremity Assessment Lower Extremity Assessment: Defer to PT evaluation   Cervical / Trunk Assessment Cervical / Trunk Assessment: Kyphotic;Other exceptions (R lateral lean)   Communication Communication Communication: No difficulties   Cognition Arousal/Alertness: Awake/alert Behavior During Therapy: Flat affect Overall Cognitive Status: No family/caregiver present to determine baseline cognitive functioning                                 General Comments: note memory deficits at baseline; not oriented to time (Feb); slow processing; increased time for problem solving   General Comments       Exercises  Shoulder Instructions      Home Living Family/patient expects to be discharged to:: Skilled nursing facility                                        Prior Functioning/Environment Level of Independence: Needs assistance  Gait / Transfers Assistance Needed: Pt reporting conflicting information. Initially reports she uses RW, then reports she has been using WC, then reports she hasn't been out of the  bed in 2 weeks. Assume pt required assist with all mobility tasks. ADL's / Homemaking Assistance Needed: Pt reports sponge bathing in the bed and required assist for other ADLs.   Comments: unsure of PLOF. Pt states her husband assisted her with bathing adn that she was only recently walking with a walker. Tight heel cords noted        OT Problem List: Decreased strength;Decreased activity tolerance;Impaired balance (sitting and/or standing);Decreased coordination;Decreased cognition;Decreased safety awareness;Decreased knowledge of use of DME or AE;Cardiopulmonary status limiting activity;Pain      OT Treatment/Interventions: Self-care/ADL training;Therapeutic exercise;Neuromuscular education;Energy conservation;DME and/or AE instruction;Therapeutic activities;Cognitive remediation/compensation;Visual/perceptual remediation/compensation;Balance training;Patient/family education    OT Goals(Current goals can be found in the care plan section) Acute Rehab OT Goals Patient Stated Goal: to lie down OT Goal Formulation: Patient unable to participate in goal setting Time For Goal Achievement: 11/23/20 Potential to Achieve Goals: Good  OT Frequency: Min 2X/week   Barriers to D/C:            Co-evaluation              AM-PAC OT "6 Clicks" Daily Activity     Outcome Measure Help from another person eating meals?: A Little Help from another person taking care of personal grooming?: A Little Help from another person toileting, which includes using toliet, bedpan, or urinal?: A Lot Help from another person bathing (including washing, rinsing, drying)?: A Lot Help from another person to put on and taking off regular upper body clothing?: A Lot Help from another person to put on and taking off regular lower body clothing?: A Lot 6 Click Score: 14   End of Session Equipment Utilized During Treatment: Gait belt Nurse Communication: Mobility status;Need for lift equipment  Activity  Tolerance: Patient tolerated treatment well Patient left: in chair;with call bell/phone within reach;with chair alarm set  OT Visit Diagnosis: Unsteadiness on feet (R26.81);Other abnormalities of gait and mobility (R26.89);Muscle weakness (generalized) (M62.81);History of falling (Z91.81);Other symptoms and signs involving cognitive function;Pain Pain - part of body:  (generalized)                Time: 9563-8756 OT Time Calculation (min): 37 min Charges:  OT General Charges $OT Visit: 1 Visit OT Evaluation $OT Eval Moderate Complexity: 1 Mod OT Treatments $Self Care/Home Management : 8-22 mins  Maurie Boettcher, OT/L   Acute OT Clinical Specialist [place] Pager [phone] Office [phone]   [facility] 11/09/2020, 9:38 AM

## 2020-11-09 NOTE — Progress Notes (Signed)
PROGRESS NOTE                                                                                                                                                                                                             Patient Demographics:    Carolyn Robertson, is a 75 y.o. female, DOB - January 25, 1946, JGO:[id number]  Outpatient Primary MD for the patient is Burnard Bunting, MD   Admit date - 11/05/2020   LOS - 4  Chief Complaint  Patient presents with  . Weakness  . Failure To Thrive       Brief Narrative: Patient is a 74 y.o. female with PMHx of hypothyroidism, stage IV CKD, CVA, asthma-presented with generalized weakness, dysuria and frequent falls.  Found to have UTI and breakthrough COVID-19 infection.  See below for further details.  COVID-19 vaccinated status: Vaccinated including booster.  Significant Events: 1/20>> Admit to [facility] for weakness/falls-with UTI and breakthrough COVID-19 infection.  Significant studies: 1/20>Chest x-ray: No active disease  COVID-19 medications: Remdesivir: 1/20>>1/23  Antibiotics: Rocephin: 1/20>>  Microbiology data: 1/20 >> urine culture: Proteus mirabilis  Procedures: None  Consults: None  DVT prophylaxis: enoxaparin (LOVENOX) injection 30 mg Start: 11/06/20 1000    Subjective:   Lying comfortably in bed-no major issues.  Feels weak.  No chest pain or shortness of breath.   Assessment  & Plan :   Sepsis secondary to Proteus UTI: Clinically improved-sepsis physiology has resolved-on Rocephin-for total of 5 days.     AKI on CKD stage IV: AKI hemodynamically mediated-improved with IVF-creatinine at baseline.  Avoid nephrotoxic agents.    Breakthrough COVID-19 infection: Respiratory symptoms-no hypoxia-chest x-ray negative-has completed 3 days of Remdesivir.  Fever: afebrile O2 requirements:  SpO2: 97 % O2 Flow Rate (L/min): 2 L/min   COVID-19 Labs: Recent  Labs    11/07/20 0432 11/08/20 0115 11/09/20 0123  DDIMER 2.96* 2.82* 2.70*  CRP 3.0* 4.2* 3.0*       Component Value Date/Time   BNP 90.3 11/06/2020 0003    Recent Labs  Lab 11/06/20 0003 11/06/20 0249  PROCALCITON 0.30 0.29    Lab Results  Component Value Date   SARSCOV2NAA POSITIVE (A) 11/05/2020   [place]andra NEGATIVE 02/16/2020   SARSCOV2NAA NOT DETECTED 03/26/2019    HTN: BP stable-antihypertensives on hold-we will resume when able over the next few days.  Hypothyroidism: TSH 227!  She taking Synthroid for several months-has tolerated several dosages of 175 mcg/day-we will plan to increase to 200 mcg in a few days given advanced age significant comorbid issues.  Plan is to repeat TSH in 3 months.  As noted above-although she has severe hypothyroidism-she appears not to have signs of myxedema.  Hypokalemia: Repleted.  GERD: Continue PPI  Depression with anxiety: Stable-continue Cymbalta/mirtazapine  Gout: Continue Febuxostat  Debility/deconditioning/frequent falls: Due to acute illness with COVID/UTI-PT with recommendations for SNF.  Social worker following.    GI prophylaxis: PPI  ABG:    Component Value Date/Time   TCO2 26 03/26/2019 1449    Vent Settings: N/A    Condition - Guarded  Family Communication  :  Spouse-[phone]-on 1/24  Code Status :  Full Code  Diet :  Diet Order            Diet Heart Room service appropriate? Yes; Fluid consistency: Thin  Diet effective now                  Disposition Plan  :   Status is: Inpatient  Remains inpatient appropriate because:Inpatient level of care appropriate due to severity of illness   Dispo: The patient is from: Home              Anticipated d/c is to: SNF.              Anticipated d/c date is: > 1 days              Patient currently is not medically stable to d/c.   Barriers to discharge: Complete 5 days of IV Rocephin-awaiting SNF bed.    Antimicorbials  :     Anti-infectives (From admission, onward)   Start     Dose/Rate Route Frequency Ordered Stop   11/07/20 1000  remdesivir 100 mg in sodium chloride 0.9 % 100 mL IVPB       "Followed by" Linked Group Details   100 mg 200 mL/hr over 30 Minutes Intravenous Daily 11/05/20 2342 11/08/20 1047   11/06/20 2200  cefTRIAXone (ROCEPHIN) 1 g in sodium chloride 0.9 % 100 mL IVPB        1 g 200 mL/hr over 30 Minutes Intravenous Every 24 hours 11/05/20 2315 11/11/20 2159   11/06/20 0100  remdesivir 200 mg in sodium chloride 0.9% 250 mL IVPB       "Followed by" Linked Group Details   200 mg 580 mL/hr over 30 Minutes Intravenous Once 11/05/20 2342 11/06/20 0128   11/05/20 2145  cefTRIAXone (ROCEPHIN) 1 g in sodium chloride 0.9 % 100 mL IVPB        1 g 200 mL/hr over 30 Minutes Intravenous  Once 11/05/20 2138 11/05/20 2217      Inpatient Medications  Scheduled Meds: . vitamin C  500 mg Oral Daily  . aspirin EC  81 mg Oral Daily  . cholecalciferol  1,000 Units Oral Daily  . [START ON 11/17/2020] cyanocobalamin  1,000 mcg Subcutaneous Q28 days  . DULoxetine  60 mg Oral QHS  . enoxaparin (LOVENOX) injection  30 mg Subcutaneous Q24H  . estrogen (conjugated)-medroxyprogesterone  1 tablet Oral Daily  . famotidine  20 mg Oral Daily  . febuxostat  40 mg Oral QHS  . gabapentin  100 mg Oral Daily  . levothyroxine  175 mcg Oral Q0600  . pantoprazole  40 mg Oral QAC breakfast  . zinc sulfate  220 mg Oral Daily  Continuous Infusions: . cefTRIAXone (ROCEPHIN)  IV 1 g (11/08/20 2100)   PRN Meds:.acetaminophen **OR** acetaminophen, baclofen, HYDROcodone-acetaminophen, loperamide, magnesium hydroxide, ondansetron **OR** ondansetron (ZOFRAN) IV, traZODone   Time Spent in minutes  25    See all Orders from today for further details   Oren Binet M.D on 11/09/2020 at 11:10 AM  To page go to [email or web address] - use universal password  [facility] -  Office  [phone]    Objective:    Vitals:   11/08/20 2000 11/09/20 0019 11/09/20 0450 11/09/20 0720  BP: (!) 146/86 134/76 132/68 123/74  Pulse: 72 85 80 82  Resp: 18 19 20 20   Temp: 98 F (36.7 C) 98.1 F (36.7 C) 97.6 F (36.4 C) 97.7 F (36.5 C)  TempSrc: Oral Oral Oral Oral  SpO2: 96% 97% 94% 97%  Weight:      Height:        Wt Readings from Last 3 Encounters:  11/05/20 57.6 kg  04/24/20 57.6 kg  04/15/20 58 kg     Intake/Output Summary (Last 24 hours) at 11/09/2020 1110 Last data filed at 11/08/2020 2018 Gross per 24 hour  Intake 240 ml  Output 300 ml  Net -60 ml     Physical Exam Gen Exam:Alert awake-not in any distress HEENT:atraumatic, normocephalic Chest: B/L clear to auscultation anteriorly CVS:S1S2 regular Abdomen:soft non tender, non distended Extremities:no edema Neurology: Non focal Skin: no rash   Data Review:    CBC Recent Labs  Lab 11/05/20 1707 11/06/20 0249 11/07/20 0432 11/08/20 0115 11/09/20 0123  WBC 15.0* 11.4* 9.0 7.7 6.8  HGB 13.1 12.0 11.5* 11.8* 11.7*  HCT 41.6 39.3 37.4 38.6 37.6  PLT 422* 345 328 309 320  MCV 85.2 86.6 86.4 86.2 85.8  MCH 26.8 26.4 26.6 26.3 26.7  MCHC 31.5 30.5 30.7 30.6 31.1  RDW 18.4* 18.4* 18.4* 18.9* 19.2*  LYMPHSABS 0.8 1.2 1.5 1.8 2.2  MONOABS 0.9 0.9 1.0 0.7 0.5  EOSABS 0.2 0.1 0.1 0.1 0.3  BASOSABS 0.1 0.1 0.1 0.0 0.0    Chemistries  Recent Labs  Lab 11/05/20 1707 11/06/20 0249 11/07/20 0432 11/08/20 0115 11/09/20 0123  NA 134* 135 136 136 135  K 3.7 3.5 3.1* 4.3 3.9  CL 100 103 105 108 107  CO2 21* 19* 19* 18* 19*  GLUCOSE 105* 95 80 93 85  BUN 24* 21 21 19 16   CREATININE 2.51* 2.25* 2.28* 2.01* 1.98*  CALCIUM 9.1 8.1* 7.7* 8.1* 8.1*  MG 1.8  --   --   --   --   AST 32 32 49* 51* 40  ALT 17 16 17 19 17   ALKPHOS 83 78 71 73 70  BILITOT 0.6 0.6 0.4 0.4 0.4   ------------------------------------------------------------------------------------------------------------------ No results for input(s): CHOL, HDL,  LDLCALC, TRIG, CHOLHDL, LDLDIRECT in the last 72 hours.  Lab Results  Component Value Date   HGBA1C 5.5 06/25/2018   ------------------------------------------------------------------------------------------------------------------ Recent Labs    11/06/20 2313  T3FREE 1.1*   ------------------------------------------------------------------------------------------------------------------ No results for input(s): VITAMINB12, FOLATE, FERRITIN, TIBC, IRON, RETICCTPCT in the last 72 hours.  Coagulation profile Recent Labs  Lab 11/06/20 0249  INR 1.2    Recent Labs    11/08/20 0115 11/09/20 0123  DDIMER 2.82* 2.70*    Cardiac Enzymes No results for input(s): CKMB, TROPONINI, MYOGLOBIN in the last 168 hours.  Invalid input(s): CK ------------------------------------------------------------------------------------------------------------------    Component Value Date/Time   BNP 90.3 11/06/2020 0003    Micro Results Recent Results (from  the past 240 hour(s))  Culture, Urine     Status: Abnormal   Collection Time: 11/05/20  2:15 AM   Specimen: Urine, Random  Result Value Ref Range Status   Specimen Description URINE, RANDOM  Final   Special Requests   Final    NONE Performed at [facility], [address], [place], [place] [address]    Culture 70,000 COLONIES/mL PROTEUS MIRABILIS (A)  Final   Report Status 11/08/2020 FINAL  Final   Organism ID, Bacteria PROTEUS MIRABILIS (A)  Final      Susceptibility   Proteus mirabilis - MIC*    AMPICILLIN <=2 SENSITIVE Sensitive     CEFAZOLIN 8 SENSITIVE Sensitive     CEFEPIME <=0.12 SENSITIVE Sensitive     CEFTRIAXONE <=0.25 SENSITIVE Sensitive     CIPROFLOXACIN <=0.25 SENSITIVE Sensitive     GENTAMICIN <=1 SENSITIVE Sensitive     IMIPENEM 4 SENSITIVE Sensitive     NITROFURANTOIN 128 RESISTANT Resistant     TRIMETH/SULFA <=20 SENSITIVE Sensitive     AMPICILLIN/SULBACTAM <=2 SENSITIVE Sensitive     PIP/TAZO <=4  SENSITIVE Sensitive     * 70,000 COLONIES/mL PROTEUS MIRABILIS  SARS CORONAVIRUS 2 (TAT 6-24 HRS) Nasopharyngeal Nasopharyngeal Swab     Status: Abnormal   Collection Time: 11/05/20  5:08 PM   Specimen: Nasopharyngeal Swab  Result Value Ref Range Status   SARS Coronavirus 2 POSITIVE (A) NEGATIVE Final    Comment: (NOTE) SARS-CoV-2 target nucleic acids are DETECTED.  The SARS-CoV-2 RNA is generally detectable in upper and lower respiratory specimens during the acute phase of infection. Positive results are indicative of the presence of SARS-CoV-2 RNA. Clinical correlation with patient history and other diagnostic information is  necessary to determine patient infection status. Positive results do not rule out bacterial infection or co-infection with other viruses.  The expected result is Negative.  Fact Sheet for Patients: SugarRoll.be  Fact Sheet for Healthcare Providers: https://www.woods-mathews.com/  This test is not yet approved or cleared by the [place] FDA and  has been authorized for detection and/or diagnosis of SARS-CoV-2 by FDA under an Emergency Use Authorization (EUA). This EUA will remain  in effect (meaning this test can be used) for the duration of the COVID-19 declaration under Section 564(b)(1) of the Act, 21 U. S.C. section 360bbb-3(b)(1), unless the authorization is terminated or revoked sooner.   Performed at [facility], [place] [address] [place], [place] [address]     Radiology Reports DG Chest [place] 1 View  Result Date: 11/05/2020 CLINICAL DATA:  General weakness EXAM: PORTABLE CHEST 1 VIEW COMPARISON:  None. FINDINGS: Mildly diminished lung volumes. No consolidation or effusion. Normal cardiomediastinal silhouette. No pneumothorax. Opacity over the right lower lung felt secondary to healing right seventh posterior rib fracture. IMPRESSION: No active disease. Probable healing right seventh posterior rib  fracture. Electronically Signed   By: Donavan Foil M.D.   On: 11/05/2020 17:58

## 2020-11-09 NOTE — Progress Notes (Deleted)
OT Cancellation Note  Patient Details Name: ARLEATHA PHILIPPS MRN: [id number] DOB: 11/27/1945   Cancelled Treatment:    Reason Eval/Treat Not Completed: Patient declined, no reason specified (Pt requesitn to wait til this pm. Will return as able.)  [facility], OT/L   Acute OT Clinical Specialist Acute Rehabilitation Services Pager [phone] Office [phone]  11/09/2020, 8:37 AM

## 2020-11-09 NOTE — TOC Progression Note (Signed)
Transition of Care [facility]) - Progression Note    Patient Details  Name: Carolyn Robertson MRN: [id number] Date of Birth: 1946/04/07  Transition of Care [facility]) CM/SW [place], [place] Phone Number: 11/09/2020, 11:11 AM  Clinical Narrative:    CSW contacted [facility] to see if they could offer a bed to pt as family had requested them specifically. Janie from [facility] noted that she was unsure if she could take any admissions at this time and would follow up with CSW when she knew. She advised that pt had been there before and she would extend a bed offer if they can take admissions. SW will continue to follow.   Expected Discharge Plan: [place] Barriers to Discharge: Continued Medical Work up  Expected Discharge Plan and Services Expected Discharge Plan: [place] arrangements for the past 2 months: Single Family Home                                       Social Determinants of Health (SDOH) Interventions    Readmission Risk Interventions No flowsheet data found.

## 2020-11-10 DIAGNOSIS — N179 Acute kidney failure, unspecified: Secondary | ICD-10-CM | POA: Diagnosis not present

## 2020-11-10 DIAGNOSIS — N3000 Acute cystitis without hematuria: Secondary | ICD-10-CM | POA: Diagnosis not present

## 2020-11-10 DIAGNOSIS — R5381 Other malaise: Secondary | ICD-10-CM | POA: Diagnosis not present

## 2020-11-10 DIAGNOSIS — U071 COVID-19: Secondary | ICD-10-CM | POA: Diagnosis not present

## 2020-11-10 LAB — COMPREHENSIVE METABOLIC PANEL
ALT: 17 U/L (ref 0–44)
AST: 33 U/L (ref 15–41)
Albumin: 2.2 g/dL — ABNORMAL LOW (ref 3.5–5.0)
Alkaline Phosphatase: 67 U/L (ref 38–126)
Anion gap: 9 (ref 5–15)
BUN: 17 mg/dL (ref 8–23)
CO2: 18 mmol/L — ABNORMAL LOW (ref 22–32)
Calcium: 8 mg/dL — ABNORMAL LOW (ref 8.9–10.3)
Chloride: 106 mmol/L (ref 98–111)
Creatinine, Ser: 1.92 mg/dL — ABNORMAL HIGH (ref 0.44–1.00)
GFR, Estimated: 27 mL/min — ABNORMAL LOW (ref >60)
Glucose, Bld: 91 mg/dL (ref 70–99)
Potassium: 3.9 mmol/L (ref 3.5–5.1)
Sodium: 133 mmol/L — ABNORMAL LOW (ref 135–145)
Total Bilirubin: 0.4 mg/dL (ref 0.3–1.2)
Total Protein: 4.5 g/dL — ABNORMAL LOW (ref 6.5–8.1)

## 2020-11-10 LAB — CBC WITH DIFFERENTIAL/PLATELET
Abs Immature Granulocytes: 0.06 10*3/uL (ref 0.00–0.07)
Basophils Absolute: 0 10*3/uL (ref 0.0–0.1)
Basophils Relative: 0 %
Eosinophils Absolute: 0.3 10*3/uL (ref 0.0–0.5)
Eosinophils Relative: 4 %
HCT: 36.2 % (ref 36.0–46.0)
Hemoglobin: 11.4 g/dL — ABNORMAL LOW (ref 12.0–15.0)
Immature Granulocytes: 1 %
Lymphocytes Relative: 28 %
Lymphs Abs: 2.1 10*3/uL (ref 0.7–4.0)
MCH: 27 pg (ref 26.0–34.0)
MCHC: 31.5 g/dL (ref 30.0–36.0)
MCV: 85.8 fL (ref 80.0–100.0)
Monocytes Absolute: 0.5 10*3/uL (ref 0.1–1.0)
Monocytes Relative: 7 %
Neutro Abs: 4.5 10*3/uL (ref 1.7–7.7)
Neutrophils Relative %: 60 %
Platelets: 305 10*3/uL (ref 150–400)
RBC: 4.22 MIL/uL (ref 3.87–5.11)
RDW: 19.9 % — ABNORMAL HIGH (ref 11.5–15.5)
WBC: 7.5 10*3/uL (ref 4.0–10.5)
nRBC: 0 % (ref 0.0–0.2)

## 2020-11-10 LAB — D-DIMER, QUANTITATIVE: D-Dimer, Quant: 2.7 ug/mL-FEU — ABNORMAL HIGH (ref 0.00–0.50)

## 2020-11-10 LAB — C-REACTIVE PROTEIN: CRP: 2.1 mg/dL — ABNORMAL HIGH (ref <1.0)

## 2020-11-10 MED ORDER — LEVOTHYROXINE SODIUM 100 MCG PO TABS
200.0000 ug | ORAL_TABLET | Freq: Every day | ORAL | Status: DC
  Administered 2020-11-11 – 2020-11-15 (×5): 200 ug via ORAL
  Filled 2020-11-10 (×5): qty 2

## 2020-11-10 NOTE — TOC Progression Note (Addendum)
Transition of Care [facility]) - Progression Note    Patient Details  Name: ORPAH HAUSNER MRN: [id number] Date of Birth: 1945-11-30  Transition of Care [facility]) CM/SW [place], LCSW Phone Number: 11/10/2020, 12:14 PM  Clinical Narrative:    11am-CSW spoke with patient to let her know that [facility] can accept her and she reported happiness. CSW trying to get in touch with her husband to complete admission paperwork. CSW spoke with patient's daughter and she stated she could have the paperwork emailed to her as [place] of [place] ([email or web address]).  12pm-[facility] unaware that patient is COVID+ as not on her previously done FL2. CSW corrected FL2 and added pasrr number. [facility] checking with their administrator.   12:20pm-[facility] unable to accept patient at this time. They are checking to see if they can accept patient this weekend once out of 10 day isolation period. No other COVID beds available at this time.    12:51pm-[facility] able to accept patient Saturday as long as no respiratory symptoms develop. Patient's daughter updated and is requesting patient not return home with her spouse as they cannot care for eachother. Daughter, Lattie Haw, is POA.    5pm-Per [facility], they can accept patient Sunday since that will be day 11.  Expected Discharge Plan: [place] Barriers to Discharge: Continued Medical Work up  Expected Discharge Plan and Services Expected Discharge Plan: [place] arrangements for the past 2 months: Single Family Home                                       Social Determinants of Health (SDOH) Interventions    Readmission Risk Interventions No flowsheet data found.

## 2020-11-10 NOTE — Progress Notes (Addendum)
PROGRESS NOTE                                                                                                                                                                                                             Patient Demographics:    Carolyn Robertson, is a 75 y.o. female, DOB - 1946/03/05, IDP:[id number]  Outpatient Primary MD for the patient is Burnard Bunting, MD   Admit date - 11/05/2020   LOS - 5  Chief Complaint  Patient presents with  . Weakness  . Failure To Thrive       Brief Narrative: Patient is a 75 y.o. female with PMHx of hypothyroidism, stage IV CKD, CVA, asthma-presented with generalized weakness, dysuria and frequent falls.  Found to have UTI and breakthrough COVID-19 infection.  See below for further details.  COVID-19 vaccinated status: Vaccinated including booster.  Significant Events: 1/20>> Admit to [facility] for weakness/falls-with UTI and breakthrough COVID-19 infection.  Significant studies: 1/20>Chest x-ray: No active disease  COVID-19 medications: Remdesivir: 1/20>>1/23  Antibiotics: Rocephin: 1/20>>1/24  Microbiology data: 1/20 >> urine culture: Proteus mirabilis  Procedures: None  Consults: None  DVT prophylaxis: enoxaparin (LOVENOX) injection 30 mg Start: 11/06/20 1000    Subjective:   Lying comfortably in bed-no major issues overnight-denies any chest pain or shortness of breath.   Assessment  & Plan :   Sepsis secondary to Proteus UTI: Clinically improved-sepsis physiology has resolved-completed Remdesivir x5 days.    AKI on CKD stage IV: AKI hemodynamically mediated-improved with IVF-creatinine at baseline.  Avoid nephrotoxic agents.    Breakthrough COVID-19 infection: No Respiratory symptoms-no hypoxia-chest x-ray negative-has completed 3 days of Remdesivir.  Fever: afebrile O2 requirements:  SpO2: 99 % O2 Flow Rate (L/min): 2 L/min   COVID-19  Labs: Recent Labs    11/08/20 0115 11/09/20 0123 11/10/20 0203  DDIMER 2.82* 2.70* 2.70*  CRP 4.2* 3.0* 2.1*       Component Value Date/Time   BNP 90.3 11/06/2020 0003    Recent Labs  Lab 11/06/20 0003 11/06/20 0249  PROCALCITON 0.30 0.29    Lab Results  Component Value Date   SARSCOV2NAA POSITIVE (A) 11/05/2020   [place] NEGATIVE 02/16/2020   SARSCOV2NAA NOT DETECTED 03/26/2019    HTN: BP stable-antihypertensives on hold-we will resume when able over the next few days.  Hypothyroidism: TSH 227!  She  acknowledges she has not been taking Synthroid for the past several months-she has tolerated almost 5 days of 175 mcg of Synthroid-we will go ahead and increase it to 200 mcg today.  Plan to repeat TSH in 3 months.    Hypokalemia: Repleted.  GERD: Continue PPI  Depression with anxiety: Stable-continue Cymbalta/mirtazapine  Gout: Continue Febuxostat  Debility/deconditioning/frequent falls: Due to acute illness with COVID/UTI-PT with recommendations for SNF.  Social worker following.    GI prophylaxis: PPI  ABG:    Component Value Date/Time   TCO2 26 03/26/2019 1449    Vent Settings: N/A    Condition - Guarded  Family Communication  :  Spouse-Carolyn Robertson-[phone]-on 1/24-we will update on 1/26 given clinical stability.  Code Status :  Full Code  Diet :  Diet Order            Diet Heart Room service appropriate? Yes; Fluid consistency: Thin  Diet effective now                  Disposition Plan  :   Status is: Inpatient  Remains inpatient appropriate because:Inpatient level of care appropriate due to severity of illness   Dispo: The patient is from: Home              Anticipated d/c is to: SNF.              Anticipated d/c date is: > 1 days              Patient currently is medically stable to d/c.   Barriers to discharge: awaiting SNF bed.    Antimicorbials  :    Anti-infectives (From admission, onward)   Start     Dose/Rate Route  Frequency Ordered Stop   11/07/20 1000  remdesivir 100 mg in sodium chloride 0.9 % 100 mL IVPB       "Followed by" Linked Group Details   100 mg 200 mL/hr over 30 Minutes Intravenous Daily 11/05/20 2342 11/08/20 1047   11/06/20 2200  cefTRIAXone (ROCEPHIN) 1 g in sodium chloride 0.9 % 100 mL IVPB        1 g 200 mL/hr over 30 Minutes Intravenous Every 24 hours 11/05/20 2315 11/11/20 2159   11/06/20 0100  remdesivir 200 mg in sodium chloride 0.9% 250 mL IVPB       "Followed by" Linked Group Details   200 mg 580 mL/hr over 30 Minutes Intravenous Once 11/05/20 2342 11/06/20 0128   11/05/20 2145  cefTRIAXone (ROCEPHIN) 1 g in sodium chloride 0.9 % 100 mL IVPB        1 g 200 mL/hr over 30 Minutes Intravenous  Once 11/05/20 2138 11/05/20 2217      Inpatient Medications  Scheduled Meds: . vitamin C  500 mg Oral Daily  . aspirin EC  81 mg Oral Daily  . cholecalciferol  1,000 Units Oral Daily  . [START ON 11/17/2020] cyanocobalamin  1,000 mcg Subcutaneous Q28 days  . DULoxetine  60 mg Oral QHS  . enoxaparin (LOVENOX) injection  30 mg Subcutaneous Q24H  . estrogen (conjugated)-medroxyprogesterone  1 tablet Oral Daily  . famotidine  20 mg Oral Daily  . febuxostat  40 mg Oral QHS  . gabapentin  100 mg Oral Daily  . levothyroxine  175 mcg Oral Q0600  . pantoprazole  40 mg Oral QAC breakfast  . zinc sulfate  220 mg Oral Daily   Continuous Infusions: . cefTRIAXone (ROCEPHIN)  IV 1 g (11/09/20 2242)   PRN  Meds:.acetaminophen **OR** acetaminophen, baclofen, HYDROcodone-acetaminophen, loperamide, magnesium hydroxide, ondansetron **OR** ondansetron (ZOFRAN) IV, traZODone   Time Spent in minutes  25    See all Orders from today for further details   Oren Binet M.D on 11/10/2020 at 11:45 AM  To page go to [email or web address] - use universal password  [facility] -  Office  [phone]    Objective:   Vitals:   11/09/20 1611 11/09/20 2025 11/10/20 0352 11/10/20 0727  BP: (!)  144/63 133/75 (!) 145/93 119/66  Pulse: 62 82 79 89  Resp: 20 18 19 18   Temp: 97.6 F (36.4 C) 97.6 F (36.4 C) 98.1 F (36.7 C) 97.6 F (36.4 C)  TempSrc: Oral Oral Oral Oral  SpO2: 98% 97% 99% 99%  Weight:      Height:        Wt Readings from Last 3 Encounters:  11/05/20 57.6 kg  04/24/20 57.6 kg  04/15/20 58 kg     Intake/Output Summary (Last 24 hours) at 11/10/2020 1145 Last data filed at 11/10/2020 0636 Gross per 24 hour  Intake 120 ml  Output 600 ml  Net -480 ml     Physical Exam Gen Exam:Alert awake-not in any distress HEENT:atraumatic, normocephalic Chest: B/L clear to auscultation anteriorly CVS:S1S2 regular Abdomen:soft non tender, non distended Extremities:no edema Neurology: Non focal Skin: no rash   Data Review:    CBC Recent Labs  Lab 11/06/20 0249 11/07/20 0432 11/08/20 0115 11/09/20 0123 11/10/20 0203  WBC 11.4* 9.0 7.7 6.8 7.5  HGB 12.0 11.5* 11.8* 11.7* 11.4*  HCT 39.3 37.4 38.6 37.6 36.2  PLT 345 328 309 320 305  MCV 86.6 86.4 86.2 85.8 85.8  MCH 26.4 26.6 26.3 26.7 27.0  MCHC 30.5 30.7 30.6 31.1 31.5  RDW 18.4* 18.4* 18.9* 19.2* 19.9*  LYMPHSABS 1.2 1.5 1.8 2.2 2.1  MONOABS 0.9 1.0 0.7 0.5 0.5  EOSABS 0.1 0.1 0.1 0.3 0.3  BASOSABS 0.1 0.1 0.0 0.0 0.0    Chemistries  Recent Labs  Lab 11/05/20 1707 11/06/20 0249 11/07/20 0432 11/08/20 0115 11/09/20 0123 11/10/20 0203  NA 134* 135 136 136 135 133*  K 3.7 3.5 3.1* 4.3 3.9 3.9  CL 100 103 105 108 107 106  CO2 21* 19* 19* 18* 19* 18*  GLUCOSE 105* 95 80 93 85 91  BUN 24* 21 21 19 16 17   CREATININE 2.51* 2.25* 2.28* 2.01* 1.98* 1.92*  CALCIUM 9.1 8.1* 7.7* 8.1* 8.1* 8.0*  MG 1.8  --   --   --   --   --   AST 32 32 49* 51* 40 33  ALT 17 16 17 19 17 17   ALKPHOS 83 78 71 73 70 67  BILITOT 0.6 0.6 0.4 0.4 0.4 0.4   ------------------------------------------------------------------------------------------------------------------ No results for input(s): CHOL, HDL, LDLCALC,  TRIG, CHOLHDL, LDLDIRECT in the last 72 hours.  Lab Results  Component Value Date   HGBA1C 5.5 06/25/2018   ------------------------------------------------------------------------------------------------------------------ No results for input(s): TSH, T4TOTAL, T3FREE, THYROIDAB in the last 72 hours.  Invalid input(s): FREET3 ------------------------------------------------------------------------------------------------------------------ No results for input(s): VITAMINB12, FOLATE, FERRITIN, TIBC, IRON, RETICCTPCT in the last 72 hours.  Coagulation profile Recent Labs  Lab 11/06/20 0249  INR 1.2    Recent Labs    11/09/20 0123 11/10/20 0203  DDIMER 2.70* 2.70*    Cardiac Enzymes No results for input(s): CKMB, TROPONINI, MYOGLOBIN in the last 168 hours.  Invalid input(s): CK ------------------------------------------------------------------------------------------------------------------    Component Value Date/Time   BNP 90.3 11/06/2020  0003    Micro Results Recent Results (from the past 240 hour(s))  Culture, Urine     Status: Abnormal   Collection Time: 11/05/20  2:15 AM   Specimen: Urine, Random  Result Value Ref Range Status   Specimen Description URINE, RANDOM  Final   Special Requests   Final    NONE Performed at [facility], [address] [place], [place] [address]    Culture 70,000 COLONIES/mL PROTEUS MIRABILIS (A)  Final   Report Status 11/08/2020 FINAL  Final   Organism ID, Bacteria PROTEUS MIRABILIS (A)  Final      Susceptibility   Proteus mirabilis - MIC*    AMPICILLIN <=2 SENSITIVE Sensitive     CEFAZOLIN 8 SENSITIVE Sensitive     CEFEPIME <=0.12 SENSITIVE Sensitive     CEFTRIAXONE <=0.25 SENSITIVE Sensitive     CIPROFLOXACIN <=0.25 SENSITIVE Sensitive     GENTAMICIN <=1 SENSITIVE Sensitive     IMIPENEM 4 SENSITIVE Sensitive     NITROFURANTOIN 128 RESISTANT Resistant     TRIMETH/SULFA <=20 SENSITIVE Sensitive      AMPICILLIN/SULBACTAM <=2 SENSITIVE Sensitive     PIP/TAZO <=4 SENSITIVE Sensitive     * 70,000 COLONIES/mL PROTEUS MIRABILIS  SARS CORONAVIRUS 2 (TAT 6-24 HRS) Nasopharyngeal Nasopharyngeal Swab     Status: Abnormal   Collection Time: 11/05/20  5:08 PM   Specimen: Nasopharyngeal Swab  Result Value Ref Range Status   SARS Coronavirus 2 POSITIVE (A) NEGATIVE Final    Comment: (NOTE) SARS-CoV-2 target nucleic acids are DETECTED.  The SARS-CoV-2 RNA is generally detectable in upper and lower respiratory specimens during the acute phase of infection. Positive results are indicative of the presence of SARS-CoV-2 RNA. Clinical correlation with patient history and other diagnostic information is  necessary to determine patient infection status. Positive results do not rule out bacterial infection or co-infection with other viruses.  The expected result is Negative.  Fact Sheet for Patients: SugarRoll.be  Fact Sheet for Healthcare Providers: https://www.woods-mathews.com/  This test is not yet approved or cleared by the [place] FDA and  has been authorized for detection and/or diagnosis of SARS-CoV-2 by FDA under an Emergency Use Authorization (EUA). This EUA will remain  in effect (meaning this test can be used) for the duration of the COVID-19 declaration under Section 564(b)(1) of the Act, 21 U. S.C. section 360bbb-3(b)(1), unless the authorization is terminated or revoked sooner.   Performed at [facility], [place] [address] [place], [place] [address]     Radiology Reports DG Chest [place] 1 View  Result Date: 11/05/2020 CLINICAL DATA:  General weakness EXAM: PORTABLE CHEST 1 VIEW COMPARISON:  None. FINDINGS: Mildly diminished lung volumes. No consolidation or effusion. Normal cardiomediastinal silhouette. No pneumothorax. Opacity over the right lower lung felt secondary to healing right seventh posterior rib fracture. IMPRESSION:  No active disease. Probable healing right seventh posterior rib fracture. Electronically Signed   By: Donavan Foil M.D.   On: 11/05/2020 17:58

## 2020-11-10 NOTE — Progress Notes (Signed)
Physical Therapy Treatment Patient Details Name: Carolyn Robertson MRN: [id number] DOB: 15-Jul-1946 Today's Date: 11/10/2020    History of Present Illness Pt is a 75 y/o female admitted secondary to weakness and failure to thrive. Found to be COVID +. Also with UTI. PMH includes memory loss, CVA, HTN, and asthma.    PT Comments    Pt demonstrating gradual progress.  Participated with multiple sit to stands but with mod A x 2.  Pt reports she could walk up until 3 weeks ago (questionable historian).  Noted tight plantarflexors and with standing tends to move R leg forward  or hip hike R hip to compensate for decreased dorsiflexion.  She was on RA with VSS.  Continue POC and recommendation for SNF.   Follow Up Recommendations  SNF;Supervision/Assistance - 24 hour (Max HH services if family takes pt home)     Financial risk analyst (measurements PT);Wheelchair cushion (measurements PT);Hospital bed;Other (comment) (hoyer)    Recommendations for Other Services       Precautions / Restrictions Precautions Precautions: Fall Precaution Comments: fragile skin    Mobility  Bed Mobility Overal bed mobility: Needs Assistance Bed Mobility: Sit to Supine       Sit to supine: Mod assist;+2 for physical assistance   General bed mobility comments: in chair at arrival  Transfers Overall transfer level: Needs assistance Equipment used: Rolling walker (2 wheeled) Transfers: Sit to/from [facility] Sit to Stand: Mod assist;+2 physical assistance Stand pivot transfers: Total assist (with STEDY)       General transfer comment: Performed sit to stand x 5 with RW with improvement each stand - required cues for hand and foot placement, posture, mod A x 2 to rise, feet blocked to prevent extension, and assist at buttock to support posture.  Performed 1 sit to stand into STEDY mod A x 2 and 1 stand from STEDY with min A. STEDY for pivot  Ambulation/Gait              General Gait Details: deferred   Stairs             Wheelchair Mobility    Modified Rankin (Stroke Patients Only)       Balance Overall balance assessment: Needs assistance Sitting-balance support: Bilateral upper extremity supported;Single extremity supported Sitting balance-Leahy Scale: Poor Sitting balance - Comments: Able to sit edge of chair but required at least single UE support   Standing balance support: Bilateral upper extremity supported Standing balance-Leahy Scale: Zero Standing balance comment: requiring UE support and mod A                            Cognition Arousal/Alertness: Awake/alert Behavior During Therapy: WFL for tasks assessed/performed Overall Cognitive Status: No family/caregiver present to determine baseline cognitive functioning                                 General Comments: note memory deficits at baseline; not oriented to time (Feb); slow processing; increased time for problem solving      Exercises Other Exercises Other Exercises: Bil PF stretch x 30 sec    General Comments General comments (skin integrity, edema, etc.): Pt on RA with sats 94 % or >      Pertinent Vitals/Pain Pain Assessment: No/denies pain    Home Living  Prior Function            PT Goals (current goals can now be found in the care plan section) Acute Rehab PT Goals Patient Stated Goal: feel better PT Goal Formulation: Patient unable to participate in goal setting Time For Goal Achievement: 11/20/20 Potential to Achieve Goals: Fair Progress towards PT goals: Progressing toward goals    Frequency    Min 2X/week      PT Plan Current plan remains appropriate    Co-evaluation              AM-PAC PT "6 Clicks" Mobility   Outcome Measure  Help needed turning from your back to your side while in a flat bed without using bedrails?: Total Help needed moving from lying on your  back to sitting on the side of a flat bed without using bedrails?: Total Help needed moving to and from a bed to a chair (including a wheelchair)?: Total Help needed standing up from a chair using your arms (e.g., wheelchair or bedside chair)?: Total Help needed to walk in hospital room?: Total Help needed climbing 3-5 steps with a railing? : Total 6 Click Score: 6    End of Session Equipment Utilized During Treatment: Gait belt Activity Tolerance: Patient tolerated treatment well Patient left: in bed;with call bell/phone within reach;with bed alarm set Nurse Communication: Mobility status PT Visit Diagnosis: Unsteadiness on feet (R26.81);Difficulty in walking, not elsewhere classified (R26.2)     Time: 3086-5784 PT Time Calculation (min) (ACUTE ONLY): 26 min  Charges:  $Therapeutic Activity: 23-37 mins                     Abran Richard, PT Acute Rehab Services Pager [phone] [facility] [phone]     Karlton Lemon 11/10/2020, 5:08 PM

## 2020-11-11 DIAGNOSIS — N179 Acute kidney failure, unspecified: Secondary | ICD-10-CM | POA: Diagnosis not present

## 2020-11-11 NOTE — Progress Notes (Signed)
PROGRESS NOTE                                                                                                                                                                                                             Patient Demographics:    Carolyn Robertson, is a 75 y.o. female, DOB - Sep 27, 1946, WUX:[id number]  Outpatient Primary MD for the patient is Burnard Bunting, MD   Admit date - 11/05/2020   LOS - 6  Chief Complaint  Patient presents with  . Weakness  . Failure To Thrive       Brief Narrative: Patient is a 75 y.o. female with PMHx of hypothyroidism, stage IV CKD, CVA, asthma-presented with generalized weakness, dysuria and frequent falls.  Found to have UTI and breakthrough COVID-19 infection.  See below for further details.  COVID-19 vaccinated status: Vaccinated including booster.  Significant Events: 1/20>> Admit to [facility] for weakness/falls-with UTI and breakthrough COVID-19 infection.  Significant studies: 1/20>Chest x-ray: No active disease  COVID-19 medications: Remdesivir: 1/20>>1/23  Antibiotics: Rocephin: 1/20>>1/24  Microbiology data: 1/20 >> urine culture: Proteus mirabilis  Procedures: None  Consults: None  DVT prophylaxis: enoxaparin (LOVENOX) injection 30 mg Start: 11/06/20 1000    Subjective:   No major issues overnight-lying comfortably in bed.   Assessment  & Plan :   Sepsis secondary to Proteus UTI: Clinically improved-sepsis physiology has resolved-completed Remdesivir x5 days.    AKI on CKD stage IV: AKI hemodynamically mediated-improved with IVF-creatinine at baseline.  Avoid nephrotoxic agents.    Breakthrough COVID-19 infection: No Respiratory symptoms-no hypoxia-chest x-ray negative-has completed 3 days of Remdesivir.  Fever: afebrile O2 requirements:  SpO2: 95 % O2 Flow Rate (L/min): 2 L/min   COVID-19 Labs: Recent Labs    11/09/20 0123 11/10/20 0203   DDIMER 2.70* 2.70*  CRP 3.0* 2.1*       Component Value Date/Time   BNP 90.3 11/06/2020 0003    Recent Labs  Lab 11/06/20 0003 11/06/20 0249  PROCALCITON 0.30 0.29    Lab Results  Component Value Date   SARSCOV2NAA POSITIVE (A) 11/05/2020   [place] NEGATIVE 02/16/2020   SARSCOV2NAA NOT DETECTED 03/26/2019    HTN: BP stable-antihypertensives on hold-we will resume when able over the next few days.  Hypothyroidism: TSH 227!  She acknowledges not taking Synthroid for the past several months-slowly increase levothyroxine  back to 200 mcg.  Repeat TSH in 3 months.  Hypokalemia: Repleted.  GERD: Continue PPI  Depression with anxiety: Stable-continue Cymbalta/mirtazapine  Gout: Continue Febuxostat  Debility/deconditioning/frequent falls: Due to acute illness with COVID/UTI-PT with recommendations for SNF.  Social worker following.    GI prophylaxis: PPI  ABG:    Component Value Date/Time   TCO2 26 03/26/2019 1449    Vent Settings: N/A    Condition - Guarded  Family Communication  :  Spouse-[phone]-left a voicemail on 1/26  Code Status :  Full Code  Diet :  Diet Order            Diet Heart Room service appropriate? Yes; Fluid consistency: Thin  Diet effective now                  Disposition Plan  :   Status is: Inpatient  Remains inpatient appropriate because:Inpatient level of care appropriate due to severity of illness   Dispo: The patient is from: Home              Anticipated d/c is to: SNF.              Anticipated d/c date is: > 1 days              Patient currently is medically stable to d/c.   Barriers to discharge: awaiting SNF bed.    Antimicorbials  :    Anti-infectives (From admission, onward)   Start     Dose/Rate Route Frequency Ordered Stop   11/07/20 1000  remdesivir 100 mg in sodium chloride 0.9 % 100 mL IVPB       "Followed by" Linked Group Details   100 mg 200 mL/hr over 30 Minutes Intravenous Daily  11/05/20 2342 11/08/20 1047   11/06/20 2200  cefTRIAXone (ROCEPHIN) 1 g in sodium chloride 0.9 % 100 mL IVPB        1 g 200 mL/hr over 30 Minutes Intravenous Every 24 hours 11/05/20 2315 11/10/20 2240   11/06/20 0100  remdesivir 200 mg in sodium chloride 0.9% 250 mL IVPB       "Followed by" Linked Group Details   200 mg 580 mL/hr over 30 Minutes Intravenous Once 11/05/20 2342 11/06/20 0128   11/05/20 2145  cefTRIAXone (ROCEPHIN) 1 g in sodium chloride 0.9 % 100 mL IVPB        1 g 200 mL/hr over 30 Minutes Intravenous  Once 11/05/20 2138 11/05/20 2217      Inpatient Medications  Scheduled Meds: . vitamin C  500 mg Oral Daily  . aspirin EC  81 mg Oral Daily  . cholecalciferol  1,000 Units Oral Daily  . [START ON 11/17/2020] cyanocobalamin  1,000 mcg Subcutaneous Q28 days  . DULoxetine  60 mg Oral QHS  . enoxaparin (LOVENOX) injection  30 mg Subcutaneous Q24H  . estrogen (conjugated)-medroxyprogesterone  1 tablet Oral Daily  . famotidine  20 mg Oral Daily  . febuxostat  40 mg Oral QHS  . gabapentin  100 mg Oral Daily  . levothyroxine  200 mcg Oral Q0600  . pantoprazole  40 mg Oral QAC breakfast  . zinc sulfate  220 mg Oral Daily   Continuous Infusions:  PRN Meds:.acetaminophen **OR** acetaminophen, baclofen, HYDROcodone-acetaminophen, loperamide, magnesium hydroxide, ondansetron **OR** ondansetron (ZOFRAN) IV, traZODone   Time Spent in minutes  15    See all Orders from today for further details   Oren Binet M.D on 11/11/2020 at 12:00 PM  To page  go to [email or web address] - use universal password  [facility] -  Office  [phone]    Objective:   Vitals:   11/10/20 1943 11/10/20 2348 11/11/20 0420 11/11/20 0800  BP: 130/85 128/71 (!) 150/60 (!) 149/72  Pulse: 84 90 72 94  Resp: 20 (!) 21 20 20   Temp: 98.2 F (36.8 C) (!) 97.5 F (36.4 C) 98.1 F (36.7 C) 98 F (36.7 C)  TempSrc: Oral Oral Oral Oral  SpO2: 94% 94% 94% 95%  Weight:      Height:         Wt Readings from Last 3 Encounters:  11/05/20 57.6 kg  04/24/20 57.6 kg  04/15/20 58 kg    No intake or output data in the 24 hours ending 11/11/20 1200   Physical Exam Gen Exam:Alert awake-not in any distress HEENT:atraumatic, normocephalic Chest: B/L clear to auscultation anteriorly CVS:S1S2 regular Abdomen:soft non tender, non distended Extremities:no edema Neurology: Non focal Skin: no rash   Data Review:    CBC Recent Labs  Lab 11/06/20 0249 11/07/20 0432 11/08/20 0115 11/09/20 0123 11/10/20 0203  WBC 11.4* 9.0 7.7 6.8 7.5  HGB 12.0 11.5* 11.8* 11.7* 11.4*  HCT 39.3 37.4 38.6 37.6 36.2  PLT 345 328 309 320 305  MCV 86.6 86.4 86.2 85.8 85.8  MCH 26.4 26.6 26.3 26.7 27.0  MCHC 30.5 30.7 30.6 31.1 31.5  RDW 18.4* 18.4* 18.9* 19.2* 19.9*  LYMPHSABS 1.2 1.5 1.8 2.2 2.1  MONOABS 0.9 1.0 0.7 0.5 0.5  EOSABS 0.1 0.1 0.1 0.3 0.3  BASOSABS 0.1 0.1 0.0 0.0 0.0    Chemistries  Recent Labs  Lab 11/05/20 1707 11/06/20 0249 11/07/20 0432 11/08/20 0115 11/09/20 0123 11/10/20 0203  NA 134* 135 136 136 135 133*  K 3.7 3.5 3.1* 4.3 3.9 3.9  CL 100 103 105 108 107 106  CO2 21* 19* 19* 18* 19* 18*  GLUCOSE 105* 95 80 93 85 91  BUN 24* 21 21 19 16 17   CREATININE 2.51* 2.25* 2.28* 2.01* 1.98* 1.92*  CALCIUM 9.1 8.1* 7.7* 8.1* 8.1* 8.0*  MG 1.8  --   --   --   --   --   AST 32 32 49* 51* 40 33  ALT 17 16 17 19 17 17   ALKPHOS 83 78 71 73 70 67  BILITOT 0.6 0.6 0.4 0.4 0.4 0.4   ------------------------------------------------------------------------------------------------------------------ No results for input(s): CHOL, HDL, LDLCALC, TRIG, CHOLHDL, LDLDIRECT in the last 72 hours.  Lab Results  Component Value Date   HGBA1C 5.5 06/25/2018   ------------------------------------------------------------------------------------------------------------------ No results for input(s): TSH, T4TOTAL, T3FREE, THYROIDAB in the last 72 hours.  Invalid input(s):  FREET3 ------------------------------------------------------------------------------------------------------------------ No results for input(s): VITAMINB12, FOLATE, FERRITIN, TIBC, IRON, RETICCTPCT in the last 72 hours.  Coagulation profile Recent Labs  Lab 11/06/20 0249  INR 1.2    Recent Labs    11/09/20 0123 11/10/20 0203  DDIMER 2.70* 2.70*    Cardiac Enzymes No results for input(s): CKMB, TROPONINI, MYOGLOBIN in the last 168 hours.  Invalid input(s): CK ------------------------------------------------------------------------------------------------------------------    Component Value Date/Time   BNP 90.3 11/06/2020 0003    Micro Results Recent Results (from the past 240 hour(s))  Culture, Urine     Status: Abnormal   Collection Time: 11/05/20  2:15 AM   Specimen: Urine, Random  Result Value Ref Range Status   Specimen Description URINE, RANDOM  Final   Special Requests   Final    NONE Performed at [facility]  Lab, [address], [place], [place] [address]    Culture 70,000 COLONIES/mL PROTEUS MIRABILIS (A)  Final   Report Status 11/08/2020 FINAL  Final   Organism ID, Bacteria PROTEUS MIRABILIS (A)  Final      Susceptibility   Proteus mirabilis - MIC*    AMPICILLIN <=2 SENSITIVE Sensitive     CEFAZOLIN 8 SENSITIVE Sensitive     CEFEPIME <=0.12 SENSITIVE Sensitive     CEFTRIAXONE <=0.25 SENSITIVE Sensitive     CIPROFLOXACIN <=0.25 SENSITIVE Sensitive     GENTAMICIN <=1 SENSITIVE Sensitive     IMIPENEM 4 SENSITIVE Sensitive     NITROFURANTOIN 128 RESISTANT Resistant     TRIMETH/SULFA <=20 SENSITIVE Sensitive     AMPICILLIN/SULBACTAM <=2 SENSITIVE Sensitive     PIP/TAZO <=4 SENSITIVE Sensitive     * 70,000 COLONIES/mL PROTEUS MIRABILIS  SARS CORONAVIRUS 2 (TAT 6-24 HRS) Nasopharyngeal Nasopharyngeal Swab     Status: Abnormal   Collection Time: 11/05/20  5:08 PM   Specimen: Nasopharyngeal Swab  Result Value Ref Range Status   SARS Coronavirus 2  POSITIVE (A) NEGATIVE Final    Comment: (NOTE) SARS-CoV-2 target nucleic acids are DETECTED.  The SARS-CoV-2 RNA is generally detectable in upper and lower respiratory specimens during the acute phase of infection. Positive results are indicative of the presence of SARS-CoV-2 RNA. Clinical correlation with patient history and other diagnostic information is  necessary to determine patient infection status. Positive results do not rule out bacterial infection or co-infection with other viruses.  The expected result is Negative.  Fact Sheet for Patients: SugarRoll.be  Fact Sheet for Healthcare Providers: https://www.woods-mathews.com/  This test is not yet approved or cleared by the [place] FDA and  has been authorized for detection and/or diagnosis of SARS-CoV-2 by FDA under an Emergency Use Authorization (EUA). This EUA will remain  in effect (meaning this test can be used) for the duration of the COVID-19 declaration under Section 564(b)(1) of the Act, 21 U. S.C. section 360bbb-3(b)(1), unless the authorization is terminated or revoked sooner.   Performed at [facility], [address] [place], [place] [address]     Radiology Reports DG Chest [place] 1 View  Result Date: 11/05/2020 CLINICAL DATA:  General weakness EXAM: PORTABLE CHEST 1 VIEW COMPARISON:  None. FINDINGS: Mildly diminished lung volumes. No consolidation or effusion. Normal cardiomediastinal silhouette. No pneumothorax. Opacity over the right lower lung felt secondary to healing right seventh posterior rib fracture. IMPRESSION: No active disease. Probable healing right seventh posterior rib fracture. Electronically Signed   By: Donavan Foil M.D.   On: 11/05/2020 17:58

## 2020-11-12 ENCOUNTER — Inpatient Hospital Stay (HOSPITAL_COMMUNITY): Payer: Medicare Other

## 2020-11-12 DIAGNOSIS — R7989 Other specified abnormal findings of blood chemistry: Secondary | ICD-10-CM

## 2020-11-12 DIAGNOSIS — R5381 Other malaise: Secondary | ICD-10-CM | POA: Diagnosis not present

## 2020-11-12 DIAGNOSIS — U071 COVID-19: Secondary | ICD-10-CM | POA: Diagnosis not present

## 2020-11-12 DIAGNOSIS — N3000 Acute cystitis without hematuria: Secondary | ICD-10-CM | POA: Diagnosis not present

## 2020-11-12 DIAGNOSIS — N179 Acute kidney failure, unspecified: Secondary | ICD-10-CM | POA: Diagnosis not present

## 2020-11-12 MED ORDER — ENOXAPARIN SODIUM 60 MG/0.6ML ~~LOC~~ SOLN: 50.0000 mg | Freq: Two times a day (BID) | SUBCUTANEOUS | Status: DC

## 2020-11-12 MED ORDER — APIXABAN 5 MG PO TABS
5.0000 mg | ORAL_TABLET | Freq: Two times a day (BID) | ORAL | Status: DC
  Administered 2020-11-12 – 2020-11-15 (×6): 5 mg via ORAL
  Filled 2020-11-12 (×6): qty 1

## 2020-11-12 NOTE — Progress Notes (Signed)
Bilateral lower extremity venous study completed.      Please see CV Proc for preliminary results.   Lorraine Terriquez, RVT  

## 2020-11-12 NOTE — Progress Notes (Signed)
PROGRESS NOTE                                                                                                                                                                                                             Patient Demographics:    Carolyn Robertson, is a 75 y.o. female, DOB - [birth year], KXF:[id number]  Outpatient Primary MD for the patient is Burnard Bunting, MD   Admit date - 11/05/2020   LOS - 7  Chief Complaint  Patient presents with  . Weakness  . Failure To Thrive       Brief Narrative: Patient is a 75 y.o. female with PMHx of hypothyroidism, stage IV CKD, CVA, asthma-presented with generalized weakness, dysuria and frequent falls.  Found to have UTI and breakthrough COVID-19 infection.  See below for further details.  COVID-19 vaccinated status: Vaccinated including booster.  Significant Events: 1/20>> Admit to [facility] for weakness/falls-with UTI and breakthrough COVID-19 infection.  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    Subjective:   Lying comfortably in bed-no major issues overnight.  Denies any chest pain or shortness of breath.   Assessment  & Plan :   Sepsis secondary to Proteus UTI: Clinically improved-sepsis physiology has resolved-completed Remdesivir x5 days.    AKI on CKD stage IV: AKI hemodynamically mediated-improved with IVF-creatinine at baseline.  Avoid nephrotoxic agents.    Breakthrough COVID-19 infection: No Respiratory symptoms-no hypoxia-chest x-ray negative-has completed 3 days of Remdesivir.  Fever: afebrile O2 requirements:  SpO2: 96  % O2 Flow Rate (L/min): 2 L/min   COVID-19 Labs: Recent Labs    11/10/20 0203  DDIMER 2.70*  CRP 2.1*       Component Value Date/Time   BNP 90.3 11/06/2020 0003    Recent Labs  Lab 11/06/20 0003 11/06/20 0249  PROCALCITON 0.30 0.29    Lab Results  Component Value Date   SARSCOV2NAA POSITIVE (A) 11/05/2020   [place] NEGATIVE 02/16/2020   SARSCOV2NAA NOT DETECTED 03/26/2019     Elevated D-dimer-chronic right femoral vein DVT: Very minimally ambulatory at baseline per spouse-currently more deconditioned-and even more debilitated-at  risk for more VTE due to COVID-19 infection-we will switch to Eliquis.  Patient/spouse aware-and are in agreement  HTN: BP stable-antihypertensives on hold-we will resume when able over the next few days.  Hypothyroidism: TSH 227!  She acknowledges not taking Synthroid for the past several months-have slowly increased levothyroxine back to 200 mcg.  Repeat TSH in 3 months.  Hypokalemia: Repleted.  GERD: Continue PPI  Depression with anxiety: Stable-continue Cymbalta/mirtazapine  Gout: Continue Febuxostat  Debility/deconditioning/frequent falls: Due to acute illness with COVID/UTI-PT with recommendations for SNF.  Social worker following.    GI prophylaxis: PPI  ABG:    Component Value Date/Time   TCO2 26 03/26/2019 1449    Vent Settings: N/A    Condition - Guarded  Family Communication  :  Spouse-Hubert-[phone] on 1/27  Code Status :  Full Code  Diet :  Diet Order            Diet Heart Room service appropriate? Yes; Fluid consistency: Thin  Diet effective now                  Disposition Plan  :   Status is: Inpatient  Remains inpatient appropriate because:Inpatient level of care appropriate due to severity of illness   Dispo: The patient is from: Home              Anticipated d/c is to: SNF.              Anticipated d/c date is: > 1 days              Patient currently is medically stable to  d/c.   Barriers to discharge: awaiting SNF bed.    Antimicorbials  :    Anti-infectives (From admission, onward)   Start     Dose/Rate Route Frequency Ordered Stop   11/07/20 1000  remdesivir 100 mg in sodium chloride 0.9 % 100 mL IVPB       "Followed by" Linked Group Details   100 mg 200 mL/hr over 30 Minutes Intravenous Daily 11/05/20 2342 11/08/20 1047   11/06/20 2200  cefTRIAXone (ROCEPHIN) 1 g in sodium chloride 0.9 % 100 mL IVPB        1 g 200 mL/hr over 30 Minutes Intravenous Every 24 hours 11/05/20 2315 11/10/20 2240   11/06/20 0100  remdesivir 200 mg in sodium chloride 0.9% 250 mL IVPB       "Followed by" Linked Group Details   200 mg 580 mL/hr over 30 Minutes Intravenous Once 11/05/20 2342 11/06/20 0128   11/05/20 2145  cefTRIAXone (ROCEPHIN) 1 g in sodium chloride 0.9 % 100 mL IVPB        1 g 200 mL/hr over 30 Minutes Intravenous  Once 11/05/20 2138 11/05/20 2217      Inpatient Medications  Scheduled Meds: . vitamin C  500 mg Oral Daily  . aspirin EC  81 mg Oral Daily  . cholecalciferol  1,000 Units Oral Daily  . [START ON 11/17/2020] cyanocobalamin  1,000 mcg Subcutaneous Q28 days  . DULoxetine  60 mg Oral QHS  . enoxaparin (LOVENOX) injection  50 mg Subcutaneous Q12H  . estrogen (conjugated)-medroxyprogesterone  1 tablet Oral Daily  . famotidine  20 mg Oral Daily  . febuxostat  40 mg Oral QHS  . gabapentin  100 mg Oral Daily  . levothyroxine  200 mcg Oral Q0600  . pantoprazole  40 mg Oral QAC breakfast  . zinc sulfate  220 mg Oral Daily   Continuous  Infusions:  PRN Meds:.acetaminophen **OR** acetaminophen, baclofen, HYDROcodone-acetaminophen, loperamide, magnesium hydroxide, ondansetron **OR** ondansetron (ZOFRAN) IV, traZODone   Time Spent in minutes  25    See all Orders from today for further details   Oren Binet M.D on 11/12/2020 at 2:14 PM  To page go to [email or web address] - use universal password  [facility] -  Office   [phone]    Objective:   Vitals:   11/11/20 2341 11/12/20 0339 11/12/20 0724 11/12/20 1240  BP: (!) 171/94 (!) 150/60 (!) 138/57 (!) 142/69  Pulse: 95 96 94 78  Resp: (!) 22 20 18 18   Temp: 98.7 F (37.1 C)  98.6 F (37 C) 98.7 F (37.1 C)  TempSrc: Oral  Axillary Axillary  SpO2: 93% 95% 93% 96%  Weight:      Height:        Wt Readings from Last 3 Encounters:  11/05/20 57.6 kg  04/24/20 57.6 kg  04/15/20 58 kg     Intake/Output Summary (Last 24 hours) at 11/12/2020 1414 Last data filed at 11/12/2020 0939 Gross per 24 hour  Intake 180 ml  Output 550 ml  Net -370 ml     Physical Exam Gen Exam:Alert awake-not in any distress HEENT:atraumatic, normocephalic Chest: B/L clear to auscultation anteriorly CVS:S1S2 regular Abdomen:soft non tender, non distended Extremities:no edema Neurology: Non focal Skin: no rash   Data Review:    CBC Recent Labs  Lab 11/06/20 0249 11/07/20 0432 11/08/20 0115 11/09/20 0123 11/10/20 0203  WBC 11.4* 9.0 7.7 6.8 7.5  HGB 12.0 11.5* 11.8* 11.7* 11.4*  HCT 39.3 37.4 38.6 37.6 36.2  PLT 345 328 309 320 305  MCV 86.6 86.4 86.2 85.8 85.8  MCH 26.4 26.6 26.3 26.7 27.0  MCHC 30.5 30.7 30.6 31.1 31.5  RDW 18.4* 18.4* 18.9* 19.2* 19.9*  LYMPHSABS 1.2 1.5 1.8 2.2 2.1  MONOABS 0.9 1.0 0.7 0.5 0.5  EOSABS 0.1 0.1 0.1 0.3 0.3  BASOSABS 0.1 0.1 0.0 0.0 0.0    Chemistries  Recent Labs  Lab 11/05/20 1707 11/06/20 0249 11/07/20 0432 11/08/20 0115 11/09/20 0123 11/10/20 0203  NA 134* 135 136 136 135 133*  K 3.7 3.5 3.1* 4.3 3.9 3.9  CL 100 103 105 108 107 106  CO2 21* 19* 19* 18* 19* 18*  GLUCOSE 105* 95 80 93 85 91  BUN 24* 21 21 19 16 17   CREATININE 2.51* 2.25* 2.28* 2.01* 1.98* 1.92*  CALCIUM 9.1 8.1* 7.7* 8.1* 8.1* 8.0*  MG 1.8  --   --   --   --   --   AST 32 32 49* 51* 40 33  ALT 17 16 17 19 17 17   ALKPHOS 83 78 71 73 70 67  BILITOT 0.6 0.6 0.4 0.4 0.4 0.4    ------------------------------------------------------------------------------------------------------------------ No results for input(s): CHOL, HDL, LDLCALC, TRIG, CHOLHDL, LDLDIRECT in the last 72 hours.  Lab Results  Component Value Date   HGBA1C 5.5 06/25/2018   ------------------------------------------------------------------------------------------------------------------ No results for input(s): TSH, T4TOTAL, T3FREE, THYROIDAB in the last 72 hours.  Invalid input(s): FREET3 ------------------------------------------------------------------------------------------------------------------ No results for input(s): VITAMINB12, FOLATE, FERRITIN, TIBC, IRON, RETICCTPCT in the last 72 hours.  Coagulation profile Recent Labs  Lab 11/06/20 0249  INR 1.2    Recent Labs    11/10/20 0203  DDIMER 2.70*    Cardiac Enzymes No results for input(s): CKMB, TROPONINI, MYOGLOBIN in the last 168 hours.  Invalid input(s): CK ------------------------------------------------------------------------------------------------------------------    Component Value Date/Time   BNP 90.3 11/06/2020  0003    Micro Results Recent Results (from the past 240 hour(s))  Culture, Urine     Status: Abnormal   Collection Time: 11/05/20  2:15 AM   Specimen: Urine, Random  Result Value Ref Range Status   Specimen Description URINE, RANDOM  Final   Special Requests   Final    NONE Performed at [facility], [address], [place], [place] [address]    Culture 70,000 COLONIES/mL PROTEUS MIRABILIS (A)  Final   Report Status 11/08/2020 FINAL  Final   Organism ID, Bacteria PROTEUS MIRABILIS (A)  Final      Susceptibility   Proteus mirabilis - MIC*    AMPICILLIN <=2 SENSITIVE Sensitive     CEFAZOLIN 8 SENSITIVE Sensitive     CEFEPIME <=0.12 SENSITIVE Sensitive     CEFTRIAXONE <=0.25 SENSITIVE Sensitive     CIPROFLOXACIN <=0.25 SENSITIVE Sensitive     GENTAMICIN <=1 SENSITIVE Sensitive      IMIPENEM 4 SENSITIVE Sensitive     NITROFURANTOIN 128 RESISTANT Resistant     TRIMETH/SULFA <=20 SENSITIVE Sensitive     AMPICILLIN/SULBACTAM <=2 SENSITIVE Sensitive     PIP/TAZO <=4 SENSITIVE Sensitive     * 70,000 COLONIES/mL PROTEUS MIRABILIS  SARS CORONAVIRUS 2 (TAT 6-24 HRS) Nasopharyngeal Nasopharyngeal Swab     Status: Abnormal   Collection Time: 11/05/20  5:08 PM   Specimen: Nasopharyngeal Swab  Result Value Ref Range Status   SARS Coronavirus 2 POSITIVE (A) NEGATIVE Final    Comment: (NOTE) SARS-CoV-2 target nucleic acids are DETECTED.  The SARS-CoV-2 RNA is generally detectable in upper and lower respiratory specimens during the acute phase of infection. Positive results are indicative of the presence of SARS-CoV-2 RNA. Clinical correlation with patient history and other diagnostic information is  necessary to determine patient infection status. Positive results do not rule out bacterial infection or co-infection with other viruses.  The expected result is Negative.  Fact Sheet for Patients: SugarRoll.be  Fact Sheet for Healthcare Providers: https://www.woods-mathews.com/  This test is not yet approved or cleared by the [place] FDA and  has been authorized for detection and/or diagnosis of SARS-CoV-2 by FDA under an Emergency Use Authorization (EUA). This EUA will remain  in effect (meaning this test can be used) for the duration of the COVID-19 declaration under Section 564(b)(1) of the Act, 21 U. S.C. section 360bbb-3(b)(1), unless the authorization is terminated or revoked sooner.   Performed at [facility], [place] [address] [place], [place] [address]     Radiology Reports DG Chest [place] 1 View  Result Date: 11/05/2020 CLINICAL DATA:  General weakness EXAM: PORTABLE CHEST 1 VIEW COMPARISON:  None. FINDINGS: Mildly diminished lung volumes. No consolidation or effusion. Normal cardiomediastinal silhouette. No  pneumothorax. Opacity over the right lower lung felt secondary to healing right seventh posterior rib fracture. IMPRESSION: No active disease. Probable healing right seventh posterior rib fracture. Electronically Signed   By: Donavan Foil M.D.   On: 11/05/2020 17:58   VAS [place] LOWER EXTREMITY VENOUS (DVT)  Result Date: 11/12/2020  Lower Venous DVT Study Other Indications: D-Dimer. Risk Factors: None identified. Anticoagulation: Lovenox. Comparison Study: Prev 03/2019 Negative Performing Technologist: Vonzell Schlatter RVT  Examination Guidelines: A complete evaluation includes B-mode imaging, spectral Doppler, color Doppler, and power Doppler as needed of all accessible portions of each vessel. Bilateral testing is considered an integral part of a complete examination. Limited examinations for reoccurring indications may be performed as noted. The reflux portion of  the exam is performed with the patient in reverse Trendelenburg.  +---------+---------------+---------+-----------+----------+--------------+ RIGHT    CompressibilityPhasicitySpontaneityPropertiesThrombus Aging +---------+---------------+---------+-----------+----------+--------------+ CFV      Full                                                        +---------+---------------+---------+-----------+----------+--------------+ SFJ      Full                                                        +---------+---------------+---------+-----------+----------+--------------+ FV Prox  Partial                                      Chronic        +---------+---------------+---------+-----------+----------+--------------+ FV Mid   Partial                                      Chronic        +---------+---------------+---------+-----------+----------+--------------+ FV DistalPartial                                      Chronic        +---------+---------------+---------+-----------+----------+--------------+ PFV      Full                                                         +---------+---------------+---------+-----------+----------+--------------+ POP      Full           Yes      Yes                                 +---------+---------------+---------+-----------+----------+--------------+ PTV      Full                                                        +---------+---------------+---------+-----------+----------+--------------+ PERO     Full                                                        +---------+---------------+---------+-----------+----------+--------------+   +---------+---------------+---------+-----------+----------+--------------+ LEFT     CompressibilityPhasicitySpontaneityPropertiesThrombus Aging +---------+---------------+---------+-----------+----------+--------------+ CFV      Full           Yes      Yes                                 +---------+---------------+---------+-----------+----------+--------------+  SFJ      Full                                                        +---------+---------------+---------+-----------+----------+--------------+ FV Prox  Full                                                        +---------+---------------+---------+-----------+----------+--------------+ FV Mid   Full                                                        +---------+---------------+---------+-----------+----------+--------------+ FV DistalFull                                                        +---------+---------------+---------+-----------+----------+--------------+ PFV      Full                                                        +---------+---------------+---------+-----------+----------+--------------+ POP      Full           Yes      Yes                                 +---------+---------------+---------+-----------+----------+--------------+ PTV      Full                                                         +---------+---------------+---------+-----------+----------+--------------+ PERO     Full                                                        +---------+---------------+---------+-----------+----------+--------------+  Summary: RIGHT: - Findings consistent with 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 Femoral vein appears post phlebitic no acute segments visualized. - No cystic structure found in the popliteal fossa.  LEFT: - There is no evidence of deep vein thrombosis in the lower extremity.  - No cystic structure found in the popliteal fossa.  *See table(s) above for measurements and observations. Electronically signed by Servando Snare MD on 11/12/2020 at 1:57:12 PM.    Final

## 2020-11-12 NOTE — Progress Notes (Signed)
Occupational Therapy Treatment Patient Details Name: Carolyn Robertson MRN: [id number] DOB: 1946/01/20 Today's Date: 11/12/2020    History of present illness Pt is a 75 y/o female admitted secondary to weakness and failure to thrive. Found to be COVID +. Also with UTI. PMH includes memory loss, CVA, HTN, and asthma.   OT comments  Pt very fatigued this date with limited activity tolerance.  She was able to reciprocally scoot to EOC this pm with mod A.  Attempted to stand x 3 with use of stedy, but unable to achieve standing despite max A provided.  On third attempt, pt put forth limited effort citing fatigue.  She was very slow to process info this date.  Continue to recommend SNF.   Follow Up Recommendations  SNF;Supervision/Assistance - 24 hour    Equipment Recommendations  3 in 1 bedside commode    Recommendations for Other Services      Precautions / Restrictions Precautions Precautions: Fall Precaution Comments: fragile skin       Mobility Bed Mobility               General bed mobility comments: Pt sitting up in chair  Transfers Overall transfer level: Needs assistance               General transfer comment: attempted to move sit to stand from recliner with use of stedy.  She was able to reciprocally scoot to EOC with mod A.  Attempted x 3 to stand, but pt unable lift buttocks from chair, and by third attempt she just dropped her hands and put forth limited effort.  She reports fatigue    Balance Overall balance assessment: Needs assistance Sitting-balance support: Bilateral upper extremity supported;Single extremity supported Sitting balance-Leahy Scale: Poor Sitting balance - Comments: requires UE support - Rt lateral lean                                   ADL either performed or assessed with clinical judgement   ADL Overall ADL's : Needs assistance/impaired Eating/Feeding: Set up                   Lower Body Dressing: Total  assistance   Toilet Transfer: Total assistance Toilet Transfer Details (indicate cue type and reason): unable despite max A and use of stedy         Functional mobility during ADLs: Total assistance       Vision       Perception     Praxis      Cognition Arousal/Alertness: Lethargic;Awake/alert Behavior During Therapy: Flat affect;WFL for tasks assessed/performed Overall Cognitive Status: Impaired/Different from baseline Area of Impairment: Attention;Following commands;Problem solving                   Current Attention Level: Focused;Sustained   Following Commands: Follows one step commands inconsistently;Follows one step commands with increased time     Problem Solving: Slow processing;Decreased initiation;Difficulty sequencing;Requires verbal cues;Requires tactile cues General Comments: Pt very slow to process info and required max cues to problem solve through simple tasks.  She reports being fatigued        Exercises     Shoulder Instructions       General Comments VSS on RA    Pertinent Vitals/ Pain       Pain Assessment: No/denies pain  Home Living  Prior Functioning/Environment              Frequency  Min 2X/week        Progress Toward Goals  OT Goals(current goals can now be found in the care plan section)  Progress towards OT goals: Not progressing toward goals - comment (lethargy)     Plan Discharge plan remains appropriate    Co-evaluation                 AM-PAC OT "6 Clicks" Daily Activity     Outcome Measure   Help from another person eating meals?: A Little Help from another person taking care of personal grooming?: A Lot Help from another person toileting, which includes using toliet, bedpan, or urinal?: Total Help from another person bathing (including washing, rinsing, drying)?: A Lot Help from another person to put on and taking off regular upper  body clothing?: A Lot Help from another person to put on and taking off regular lower body clothing?: Total 6 Click Score: 11    End of Session    OT Visit Diagnosis: Unsteadiness on feet (R26.81);Other abnormalities of gait and mobility (R26.89);Muscle weakness (generalized) (M62.81);History of falling (Z91.81);Other symptoms and signs involving cognitive function;Pain   Activity Tolerance Patient limited by lethargy   Patient Left in chair;with call bell/phone within reach   Nurse Communication Mobility status;Need for lift equipment        Time: 1241-1259 OT Time Calculation (min): 18 min  Charges: OT General Charges $OT Visit: 1 Visit OT Treatments $Therapeutic Activity: 8-22 mins  Nilsa Nutting OTR/L Acute Rehabilitation Services Pager [phone] Office [phone]    Lucille Passy M 11/12/2020, 4:04 PM

## 2020-11-12 NOTE — Progress Notes (Signed)
ANTICOAGULATION CONSULT NOTE - Initial Consult  Pharmacy Consult for apixaban Indication: chronic DVT  Allergies  Allergen Reactions  . Other Swelling    A bandage/gauze that was used on lower extremity wound at [facility] health, unsure type- Pt states her foot turned red and swelled up  . Penicillins Swelling and Rash    Has patient had a PCN reaction causing immediate rash, facial/tongue/throat swelling, SOB or lightheadedness with hypotension: Unknown Has patient had a PCN reaction causing severe rash involving mucus membranes or skin necrosis: Unknown Has patient had a PCN reaction that required hospitalization: No Has patient had a PCN reaction occurring within the last 10 years: No If all of the above answers are "NO", then may proceed with Cephalosporin use.   . Sulfa Antibiotics Itching and Swelling  . Codeine Nausea Only  . Hydrocodone-Acetaminophen Other (See Comments)    Causes hallucinations  . Naproxen Sodium Swelling and Other (See Comments)    (ALEVE) Red Man Syndrome  . Tape Rash and Other (See Comments)    BURNS SKIN --PAPER TAPE IS OKAY    Patient Measurements: Height: 5\' 4"  (162.6 cm) Weight: 57.6 kg (126 lb 15.8 oz) IBW/kg (Calculated) : 54.7   Vital Signs: Temp: 98.7 F (37.1 C) (01/27 1240) Temp Source: Axillary (01/27 1240) BP: 142/69 (01/27 1240) Pulse Rate: 78 (01/27 1240)  Labs: Recent Labs    11/10/20 0203  HGB 11.4*  HCT 36.2  PLT 305  CREATININE 1.92*    Estimated Creatinine Clearance: 21.5 mL/min (A) (by C-G formula based on SCr of 1.92 mg/dL (H)).   Medical History: Past Medical History:  Diagnosis Date  . Anxiety   . Arthritis    RA  . 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 Depression   . GERD (gastroesophageal reflux disease)   . History of blood transfusion    after miscarrige  . History of kidney stones    x 2  . Hypertension   . Hypothyroidism   . Incontinent of urine    incontinent at night   . Kidney failure   . Memory loss    "after TIA"  . Pneumonia    as a child and teenager  . Renal disorder    stage IV kidney disease  . Stroke ([place])   . Thyroid disease   . TIA (transient ischemic attack) 06/2018    Assessment: 75 yo female with COVID showing chronic DVT on dopplers. MD wishes to start apixaban for VTE prophylaxis. D/W Dr. Sloan Leiter, will not load with 10mg  apixaban due to chronic nature of DVT.   Goal of Therapy:  Prevention of VTE Monitor platelets by anticoagulation protocol: Yes   Plan:  D/C enoxaparin Apixaban 5mg  PO BID  Annaleise Burger A. Levada Dy, PharmD, BCPS, FNKF Clinical Pharmacist [place] Please utilize Amion for appropriate phone number to reach the unit pharmacist ([facility])   11/12/2020,2:26 PM

## 2020-11-13 DIAGNOSIS — N179 Acute kidney failure, unspecified: Secondary | ICD-10-CM | POA: Diagnosis not present

## 2020-11-13 LAB — COMPREHENSIVE METABOLIC PANEL
ALT: 17 U/L (ref 0–44)
AST: 30 U/L (ref 15–41)
Albumin: 2.6 g/dL — ABNORMAL LOW (ref 3.5–5.0)
Alkaline Phosphatase: 61 U/L (ref 38–126)
Anion gap: 12 (ref 5–15)
BUN: 18 mg/dL (ref 8–23)
CO2: 17 mmol/L — ABNORMAL LOW (ref 22–32)
Calcium: 8.5 mg/dL — ABNORMAL LOW (ref 8.9–10.3)
Chloride: 108 mmol/L (ref 98–111)
Creatinine, Ser: 2 mg/dL — ABNORMAL HIGH (ref 0.44–1.00)
GFR, Estimated: 25 mL/min — ABNORMAL LOW (ref >60)
Glucose, Bld: 75 mg/dL (ref 70–99)
Potassium: 4 mmol/L (ref 3.5–5.1)
Sodium: 137 mmol/L (ref 135–145)
Total Bilirubin: 0.8 mg/dL (ref 0.3–1.2)
Total Protein: 5.1 g/dL — ABNORMAL LOW (ref 6.5–8.1)

## 2020-11-13 LAB — D-DIMER, QUANTITATIVE: D-Dimer, Quant: 2.58 ug/mL-FEU — ABNORMAL HIGH (ref 0.00–0.50)

## 2020-11-13 NOTE — Progress Notes (Addendum)
PROGRESS NOTE                                                                                                                                                                                                             Patient Demographics:    Carolyn Robertson, is a 75 y.o. female, DOB - [birth year], ENI:[id number]  Outpatient Primary MD for the patient is Burnard Bunting, MD   Admit date - 11/05/2020   LOS - 8  Chief Complaint  Patient presents with  . Weakness  . Failure To Thrive       Brief Narrative: Patient is a 75 y.o. female with PMHx of hypothyroidism, stage IV CKD, CVA, asthma-presented with generalized weakness, dysuria and frequent falls.  Found to have UTI and breakthrough COVID-19 infection.  See below for further details.  COVID-19 vaccinated status: Vaccinated including booster.  Significant Events: 1/20>> Admit to [facility] for weakness/falls-with UTI and breakthrough COVID-19 infection.  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  No chest pain or shortness of breath.   Assessment  & Plan :   Sepsis secondary to Proteus UTI: Clinically improved-sepsis physiology has resolved-completed Remdesivir x5 days.    AKI on CKD stage IV: AKI hemodynamically mediated-improved with IVF-creatinine at baseline.  Avoid nephrotoxic agents.    Breakthrough COVID-19 infection: No Respiratory symptoms-no hypoxia-chest x-ray negative-has completed 3 days of Remdesivir.  Needs 10 days of isolation-last day on 1/29.  Fever: afebrile O2 requirements:  SpO2: 97  % O2 Flow Rate (L/min): 2 L/min   COVID-19 Labs: Recent Labs    11/13/20 0106  DDIMER 2.58*       Component Value Date/Time   BNP 90.3 11/06/2020 0003    No results for input(s): PROCALCITON in the last 168 hours.  Lab Results  Component Value Date   [place] (A) 11/05/2020   [place] NEGATIVE 02/16/2020   [place] NOT DETECTED 03/26/2019     Elevated D-dimer-chronic right femoral vein DVT: Very minimally ambulatory at baseline-at risk for propagation of DVT-continue Eliquis.  HTN: BP stable-antihypertensives  on hold-we will resume when able over the next few days.  Hypothyroidism: TSH 227!  She acknowledges not taking Synthroid for the past several months-have slowly increased levothyroxine back to 200 mcg.  Repeat TSH in 3 months.  Hypokalemia: Repleted.  GERD: Continue PPI  Depression with anxiety: Stable-continue Cymbalta/mirtazapine  Gout: Continue Febuxostat  Debility/deconditioning/frequent falls: Due to acute illness with COVID/UTI-PT with recommendations for SNF.  Social worker following-apparently will be able to go to SNF on 1/30.    GI prophylaxis: PPI  ABG:    Component Value Date/Time   TCO2 26 03/26/2019 1449    Vent Settings: N/A    Condition - Guarded  Family Communication  :  Spouse-Hubert-[phone] on 1/27  Code Status :  Full Code  Diet :  Diet Order            Diet Heart Room service appropriate? Yes; Fluid consistency: Thin  Diet effective now                  Disposition Plan  :   Status is: Inpatient  Remains inpatient appropriate because:Inpatient level of care appropriate due to severity of illness   Dispo: The patient is from: Home              Anticipated d/c is to: SNF.              Anticipated d/c date is: > 1 days              Patient currently is medically stable to d/c.   Barriers to discharge: awaiting SNF bed-Per social work-plan to d/d to SNF on 1/30  Antimicorbials  :     Anti-infectives (From admission, onward)   Start     Dose/Rate Route Frequency Ordered Stop   11/07/20 1000  remdesivir 100 mg in sodium chloride 0.9 % 100 mL IVPB       "Followed by" Linked Group Details   100 mg 200 mL/hr over 30 Minutes Intravenous Daily 11/05/20 2342 11/08/20 1047   11/06/20 2200  cefTRIAXone (ROCEPHIN) 1 g in sodium chloride 0.9 % 100 mL IVPB        1 g 200 mL/hr over 30 Minutes Intravenous Every 24 hours 11/05/20 2315 11/10/20 2240   11/06/20 0100  remdesivir 200 mg in sodium chloride 0.9% 250 mL IVPB       "Followed by" Linked Group Details   200 mg 580 mL/hr over 30 Minutes Intravenous Once 11/05/20 2342 11/06/20 0128   11/05/20 2145  cefTRIAXone (ROCEPHIN) 1 g in sodium chloride 0.9 % 100 mL IVPB        1 g 200 mL/hr over 30 Minutes Intravenous  Once 11/05/20 2138 11/05/20 2217      Inpatient Medications  Scheduled Meds: . apixaban  5 mg Oral BID  . vitamin C  500 mg Oral Daily  . cholecalciferol  1,000 Units Oral Daily  . [START ON 11/17/2020] cyanocobalamin  1,000 mcg Subcutaneous Q28 days  . DULoxetine  60 mg Oral QHS  . estrogen (conjugated)-medroxyprogesterone  1 tablet Oral Daily  . famotidine  20 mg Oral Daily  . febuxostat  40 mg Oral QHS  . gabapentin  100 mg Oral Daily  . levothyroxine  200 mcg Oral Q0600  . pantoprazole  40 mg Oral QAC breakfast  . zinc sulfate  220 mg Oral Daily   Continuous Infusions:  PRN Meds:.acetaminophen **OR** acetaminophen, baclofen, HYDROcodone-acetaminophen, loperamide, magnesium hydroxide, ondansetron **OR** ondansetron (ZOFRAN) IV, traZODone  Time Spent in minutes  15    See all Orders from today for further details   Oren Binet M.D on 11/13/2020 at 1:57 PM  To page go to [email or web address] - use universal password  [facility] -  Office  [phone]    Objective:   Vitals:   11/12/20 1647 11/12/20 1954 11/13/20 0005 11/13/20 0421  BP: (!) 167/81 (!) 175/96 (!) 160/82 129/82  Pulse:  91 84 90 78  Resp: 18 19 17 18   Temp: 98 F (36.7 C) 98.1 F (36.7 C) 98.3 F (36.8 C) 98.1 F (36.7 C)  TempSrc: Axillary Axillary Axillary Axillary  SpO2: 90% 99% 95% 97%  Weight:      Height:        Wt Readings from Last 3 Encounters:  11/05/20 57.6 kg  04/24/20 57.6 kg  04/15/20 58 kg     Intake/Output Summary (Last 24 hours) at 11/13/2020 1357 Last data filed at 11/13/2020 0939 Gross per 24 hour  Intake 180 ml  Output 200 ml  Net -20 ml     Physical Exam Gen Exam:Alert awake-not in any distress HEENT:atraumatic, normocephalic Chest: B/L clear to auscultation anteriorly CVS:S1S2 regular Abdomen:soft non tender, non distended Extremities:no edema Neurology: Non focal Skin: no rash   Data Review:    CBC Recent Labs  Lab 11/07/20 0432 11/08/20 0115 11/09/20 0123 11/10/20 0203  WBC 9.0 7.7 6.8 7.5  HGB 11.5* 11.8* 11.7* 11.4*  HCT 37.4 38.6 37.6 36.2  PLT 328 309 320 305  MCV 86.4 86.2 85.8 85.8  MCH 26.6 26.3 26.7 27.0  MCHC 30.7 30.6 31.1 31.5  RDW 18.4* 18.9* 19.2* 19.9*  LYMPHSABS 1.5 1.8 2.2 2.1  MONOABS 1.0 0.7 0.5 0.5  EOSABS 0.1 0.1 0.3 0.3  BASOSABS 0.1 0.0 0.0 0.0    Chemistries  Recent Labs  Lab 11/07/20 0432 11/08/20 0115 11/09/20 0123 11/10/20 0203 11/13/20 0106  NA 136 136 135 133* 137  K 3.1* 4.3 3.9 3.9 4.0  CL 105 108 107 106 108  CO2 19* 18* 19* 18* 17*  GLUCOSE 80 93 85 91 75  BUN 21 19 16 17 18   CREATININE 2.28* 2.01* 1.98* 1.92* 2.00*  CALCIUM 7.7* 8.1* 8.1* 8.0* 8.5*  AST 49* 51* 40 33 30  ALT 17 19 17 17 17   ALKPHOS 71 73 70 67 61  BILITOT 0.4 0.4 0.4 0.4 0.8   ------------------------------------------------------------------------------------------------------------------ No results for input(s): CHOL, HDL, LDLCALC, TRIG, CHOLHDL, LDLDIRECT in the last 72 hours.  Lab Results  Component Value Date   HGBA1C 5.5 06/25/2018    ------------------------------------------------------------------------------------------------------------------ No results for input(s): TSH, T4TOTAL, T3FREE, THYROIDAB in the last 72 hours.  Invalid input(s): FREET3 ------------------------------------------------------------------------------------------------------------------ No results for input(s): VITAMINB12, FOLATE, FERRITIN, TIBC, IRON, RETICCTPCT in the last 72 hours.  Coagulation profile No results for input(s): INR, PROTIME in the last 168 hours.  Recent Labs    11/13/20 0106  DDIMER 2.58*    Cardiac Enzymes No results for input(s): CKMB, TROPONINI, MYOGLOBIN in the last 168 hours.  Invalid input(s): CK ------------------------------------------------------------------------------------------------------------------    Component Value Date/Time   BNP 90.3 11/06/2020 0003    Micro Results Recent Results (from the past 240 hour(s))  Culture, Urine     Status: Abnormal   Collection Time: 11/05/20  2:15 AM   Specimen: Urine, Random  Result Value Ref Range Status   Specimen Description URINE, RANDOM  Final   Special Requests   Final    NONE Performed at [facility]  Hospital Lab, [place] [address], [place], [place] [address]    Culture 70,000 COLONIES/mL PROTEUS MIRABILIS (A)  Final   Report Status 11/08/2020 FINAL  Final   Organism ID, Bacteria PROTEUS MIRABILIS (A)  Final      Susceptibility   Proteus mirabilis - MIC*    AMPICILLIN <=2 SENSITIVE Sensitive     CEFAZOLIN 8 SENSITIVE Sensitive     CEFEPIME <=0.12 SENSITIVE Sensitive     CEFTRIAXONE <=0.25 SENSITIVE Sensitive     CIPROFLOXACIN <=0.25 SENSITIVE Sensitive     GENTAMICIN <=1 SENSITIVE Sensitive     IMIPENEM 4 SENSITIVE Sensitive     NITROFURANTOIN 128 RESISTANT Resistant     TRIMETH/SULFA <=20 SENSITIVE Sensitive     AMPICILLIN/SULBACTAM <=2 SENSITIVE Sensitive     PIP/TAZO <=4 SENSITIVE Sensitive     * 70,000 COLONIES/mL PROTEUS MIRABILIS  SARS  CORONAVIRUS 2 (TAT 6-24 HRS) Nasopharyngeal Nasopharyngeal Swab     Status: Abnormal   Collection Time: 11/05/20  5:08 PM   Specimen: Nasopharyngeal Swab  Result Value Ref Range Status   SARS Coronavirus 2 POSITIVE (A) NEGATIVE Final    Comment: (NOTE) SARS-CoV-2 target nucleic acids are DETECTED.  The SARS-CoV-2 RNA is generally detectable in upper and lower respiratory specimens during the acute phase of infection. Positive results are indicative of the presence of SARS-CoV-2 RNA. Clinical correlation with patient history and other diagnostic information is  necessary to determine patient infection status. Positive results do not rule out bacterial infection or co-infection with other viruses.  The expected result is Negative.  Fact Sheet for Patients: SugarRoll.be  Fact Sheet for Healthcare Providers: https://www.woods-mathews.com/  This test is not yet approved or cleared by the [place] FDA and  has been authorized for detection and/or diagnosis of SARS-CoV-2 by FDA under an Emergency Use Authorization (EUA). This EUA will remain  in effect (meaning this test can be used) for the duration of the COVID-19 declaration under Section 564(b)(1) of the Act, 21 U. S.C. section 360bbb-3(b)(1), unless the authorization is terminated or revoked sooner.   Performed at [facility], [place] [address] [place], [place] [address]     Radiology Reports DG Chest [place] 1 View  Result Date: 11/05/2020 CLINICAL DATA:  General weakness EXAM: PORTABLE CHEST 1 VIEW COMPARISON:  None. FINDINGS: Mildly diminished lung volumes. No consolidation or effusion. Normal cardiomediastinal silhouette. No pneumothorax. Opacity over the right lower lung felt secondary to healing right seventh posterior rib fracture. IMPRESSION: No active disease. Probable healing right seventh posterior rib fracture. Electronically Signed   By: Donavan Foil M.D.   On:  11/05/2020 17:58   VAS [place] LOWER EXTREMITY VENOUS (DVT)  Result Date: 11/12/2020  Lower Venous DVT Study Other Indications: D-Dimer. Risk Factors: None identified. Anticoagulation: Lovenox. Comparison Study: Prev 03/2019 Negative Performing Technologist: Vonzell Schlatter RVT  Examination Guidelines: A complete evaluation includes B-mode imaging, spectral Doppler, color Doppler, and power Doppler as needed of all accessible portions of each vessel. Bilateral testing is considered an integral part of a complete examination. Limited examinations for reoccurring indications may be performed as noted. The reflux portion of the exam is performed with the patient in reverse Trendelenburg.  +---------+---------------+---------+-----------+----------+--------------+ RIGHT    CompressibilityPhasicitySpontaneityPropertiesThrombus Aging +---------+---------------+---------+-----------+----------+--------------+ CFV      Full                                                        +---------+---------------+---------+-----------+----------+--------------+  SFJ      Full                                                        +---------+---------------+---------+-----------+----------+--------------+ FV Prox  Partial                                      Chronic        +---------+---------------+---------+-----------+----------+--------------+ FV Mid   Partial                                      Chronic        +---------+---------------+---------+-----------+----------+--------------+ FV DistalPartial                                      Chronic        +---------+---------------+---------+-----------+----------+--------------+ PFV      Full                                                        +---------+---------------+---------+-----------+----------+--------------+ POP      Full           Yes      Yes                                  +---------+---------------+---------+-----------+----------+--------------+ PTV      Full                                                        +---------+---------------+---------+-----------+----------+--------------+ PERO     Full                                                        +---------+---------------+---------+-----------+----------+--------------+   +---------+---------------+---------+-----------+----------+--------------+ LEFT     CompressibilityPhasicitySpontaneityPropertiesThrombus Aging +---------+---------------+---------+-----------+----------+--------------+ CFV      Full           Yes      Yes                                 +---------+---------------+---------+-----------+----------+--------------+ SFJ      Full                                                        +---------+---------------+---------+-----------+----------+--------------+ FV Prox  Full                                                        +---------+---------------+---------+-----------+----------+--------------+  FV Mid   Full                                                        +---------+---------------+---------+-----------+----------+--------------+ FV DistalFull                                                        +---------+---------------+---------+-----------+----------+--------------+ PFV      Full                                                        +---------+---------------+---------+-----------+----------+--------------+ POP      Full           Yes      Yes                                 +---------+---------------+---------+-----------+----------+--------------+ PTV      Full                                                        +---------+---------------+---------+-----------+----------+--------------+ PERO     Full                                                         +---------+---------------+---------+-----------+----------+--------------+  Summary: RIGHT: - Findings consistent with 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 Femoral vein appears post phlebitic no acute segments visualized. - No cystic structure found in the popliteal fossa.  LEFT: - There is no evidence of deep vein thrombosis in the lower extremity.  - No cystic structure found in the popliteal fossa.  *See table(s) above for measurements and observations. Electronically signed by Servando Snare MD on 11/12/2020 at 1:57:12 PM.    Final

## 2020-11-13 NOTE — Care Management Important Message (Signed)
Important Message  Patient Details  Name: Carolyn Robertson MRN: [id number] Date of Birth: December 16, 1945   Medicare Important Message Given:  Yes - Important Message mailed due to current National Emergency   Verbal consent obtained due to current National Emergency  Relationship to patient: Self Contact Name: Shania Call Date: 11/13/20  Time: 1400 Phone: [phone] Outcome: No Answer/Busy Important Message mailed to: Patient address on file    Delorse Lek 11/13/2020, 2:00 PM

## 2020-11-13 NOTE — Progress Notes (Signed)
Physical Therapy Treatment Patient Details Name: Carolyn Robertson MRN: [id number] DOB: 17-Nov-1945 Today's Date: 11/13/2020    History of Present Illness Pt is a 75 y/o female admitted secondary to weakness and failure to thrive. Found to be COVID +. Also with UTI. PMH includes memory loss, CVA, HTN, and asthma. 1/27 doppler revealed chronic RLE DVT in femoral vein.    PT Comments    Pt received in recliner. Encouragement required to participate in therapy. Sit to stand x 3 trials from recliner. Pt able to clear bottom from chair but unable to attain full, upright stance. Pt performed UE/LE exercises in recliner. Pt remained in recliner with feet elevated at end of session.    Follow Up Recommendations  SNF;Supervision/Assistance - 24 hour (max HH services if family takes pt home)     Financial risk analyst (measurements PT);Wheelchair cushion (measurements PT);Hospital bed;Other (comment) (hoyer lift)    Recommendations for Other Services       Precautions / Restrictions Precautions Precautions: Fall;Other (comment) Precaution Comments: fragile skin    Mobility  Bed Mobility               General bed mobility comments: Pt sitting up in chair  Transfers Overall transfer level: Needs assistance Equipment used: Rolling walker (2 wheeled) Transfers: Sit to/from Stand Sit to Stand: Max assist         General transfer comment: sit to stand x 3 trials, once with RW and twice in stedy. Pt able to clear bottom from chair but unable to attain full upright stance.  Ambulation/Gait                 Stairs             Wheelchair Mobility    Modified Rankin (Stroke Patients Only)       Balance                                            Cognition Arousal/Alertness: Awake/alert Behavior During Therapy: Flat affect;WFL for tasks assessed/performed Overall Cognitive Status: Impaired/Different from baseline Area of  Impairment: Attention;Following commands;Problem solving;Memory;Awareness;Safety/judgement                   Current Attention Level: Sustained Memory: Decreased short-term memory Following Commands: Follows one step commands with increased time Safety/Judgement: Decreased awareness of safety Awareness: Emergent Problem Solving: Slow processing;Decreased initiation;Difficulty sequencing;Requires verbal cues;Requires tactile cues General Comments: slow processing, poor motivation      Exercises General Exercises - Upper Extremity Shoulder Flexion: AROM;Both;5 reps;Seated Elbow Flexion: AROM;Both;5 reps;Seated General Exercises - Lower Extremity Ankle Circles/Pumps: AROM;Both;5 reps;Seated Long Arc Quad: AROM;Right;Left;5 reps;Seated    General Comments General comments (skin integrity, edema, etc.): VSS on RA      Pertinent Vitals/Pain Pain Assessment: Faces Faces Pain Scale: Hurts little more Pain Location: generalized Pain Descriptors / Indicators: Discomfort;Grimacing;Guarding Pain Intervention(s): Limited activity within patient's tolerance;Monitored during session;Repositioned    Home Living                      Prior Function            PT Goals (current goals can now be found in the care plan section) Acute Rehab PT Goals Patient Stated Goal: feel better Progress towards PT goals: Progressing toward goals    Frequency    Min 2X/week  PT Plan Current plan remains appropriate    Co-evaluation              AM-PAC PT "6 Clicks" Mobility   Outcome Measure  Help needed turning from your back to your side while in a flat bed without using bedrails?: Total Help needed moving from lying on your back to sitting on the side of a flat bed without using bedrails?: Total Help needed moving to and from a bed to a chair (including a wheelchair)?: Total Help needed standing up from a chair using your arms (e.g., wheelchair or bedside chair)?:  Total Help needed to walk in hospital room?: Total Help needed climbing 3-5 steps with a railing? : Total 6 Click Score: 6    End of Session Equipment Utilized During Treatment: Gait belt Activity Tolerance: Patient limited by fatigue Patient left: in chair;with call bell/phone within reach;with chair alarm set Nurse Communication: Mobility status PT Visit Diagnosis: Unsteadiness on feet (R26.81);Difficulty in walking, not elsewhere classified (R26.2)     Time: 1010-1034 PT Time Calculation (min) (ACUTE ONLY): 24 min  Charges:  $Therapeutic Exercise: 8-22 mins $Therapeutic Activity: 8-22 mins                     Lorrin Goodell, PT  Office # [phone] Pager [phone]    Lorriane Shire 11/13/2020, 1:02 PM

## 2020-11-14 DIAGNOSIS — N3000 Acute cystitis without hematuria: Secondary | ICD-10-CM

## 2020-11-14 NOTE — Plan of Care (Signed)
  Problem: Education: Goal: Knowledge of General Education information will improve Description: Including pain rating scale, medication(s)/side effects and non-pharmacologic comfort measures Outcome: Progressing   Problem: Health Behavior/Discharge Planning: Goal: Ability to manage health-related needs will improve Outcome: Progressing   Problem: Clinical Measurements: Goal: Ability to maintain clinical measurements within normal limits will improve Outcome: Progressing Goal: Will remain free from infection Outcome: Progressing Goal: Diagnostic test results will improve Outcome: Progressing Goal: Cardiovascular complication will be avoided Outcome: Progressing   Problem: Activity: Goal: Risk for activity intolerance will decrease Outcome: Progressing   Problem: Nutrition: Goal: Adequate nutrition will be maintained Outcome: Progressing   Problem: Education: Goal: Knowledge of risk factors and measures for prevention of condition will improve Outcome: Progressing

## 2020-11-14 NOTE — Progress Notes (Signed)
PROGRESS NOTE                                                                                                                                                                                                             Patient Demographics:    Carolyn Robertson, is a 75 y.o. female, DOB - 12-25-1945, EXB:[id number]  Outpatient Primary MD for the patient is Burnard Bunting, MD   Admit date - 11/05/2020   LOS - 9  Chief Complaint  Patient presents with  . Weakness  . Failure To Thrive       Brief Narrative: Patient is a 75 y.o. female with PMHx of hypothyroidism, stage IV CKD, CVA, asthma-presented with generalized weakness, dysuria and frequent falls.  Found to have UTI and breakthrough COVID-19 infection.  See below for further details.  COVID-19 vaccinated status: Vaccinated including booster.  Significant Events: 1/20>> Admit to [facility] for weakness/falls-with UTI and breakthrough COVID-19 infection.  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 No focal weakness.   Assessment  & Plan :   Sepsis secondary to Proteus UTI : Clinically improved-sepsis physiology has resolved-completed ABX x5 days.    AKI on CKD stage IV : AKI hemodynamically mediated-improved with IVF-creatinine at baseline.  Avoid nephrotoxic agents.    Breakthrough COVID-19 infection : No Respiratory symptoms-no hypoxia-chest x-ray negative-has completed 3 days of Remdesivir.  Needs 10 days of  isolation-last day on 1/29.  Fever: afebrile  O2 requirements:  SpO2: 99 % O2 Flow Rate (L/min): 2 L/min   COVID-19 Labs: Recent Labs    11/13/20 0106  DDIMER 2.58*       Component Value Date/Time   BNP 90.3 11/06/2020 0003    No results for input(s): PROCALCITON in the last 168 hours.  Lab Results  Component Value Date   SARSCOV2NAA POSITIVE (A) 11/05/2020   [place] NEGATIVE 02/16/2020   SARSCOV2NAA NOT DETECTED 03/26/2019     Elevated D-dimer-chronic right femoral vein  DVT: Very minimally ambulatory at baseline-at risk for propagation of DVT-continue Eliquis.  HTN: BP stable-antihypertensives on hold-we will resume when able over the next few days.  Hypothyroidism: TSH 227!  She acknowledges not taking Synthroid for the past several months-have slowly increased levothyroxine back to 200 mcg.  Repeat TSH in 3 months.  Hypokalemia: Repleted.  GERD: Continue PPI  Depression with anxiety: Stable-continue Cymbalta/mirtazapine  Gout: Continue Febuxostat  Debility/deconditioning/frequent falls: Due to acute illness with COVID/UTI-PT with recommendations for SNF.  Social worker following-apparently will be able to go to SNF on 1/30.    GI prophylaxis: PPI   Condition - Fair  Family Communication  :  Spouse-Hubert-[phone] on 1/27  Code Status :  Full Code  Diet :  Diet Order            Diet Heart Room service appropriate? Yes; Fluid consistency: Thin  Diet effective now                  Disposition Plan  :   Status is: Inpatient  Remains inpatient appropriate because:Inpatient level of care appropriate due to severity of illness   Dispo: The patient is from: Home              Anticipated d/c is to: SNF.              Anticipated d/c date is: > 1 days              Patient currently is medically stable to d/c.   Barriers to discharge: awaiting SNF bed-Per social work-plan to d/d to SNF on 1/30  Antimicorbials  :    Anti-infectives (From  admission, onward)   Start     Dose/Rate Route Frequency Ordered Stop   11/07/20 1000  remdesivir 100 mg in sodium chloride 0.9 % 100 mL IVPB       "Followed by" Linked Group Details   100 mg 200 mL/hr over 30 Minutes Intravenous Daily 11/05/20 2342 11/08/20 1047   11/06/20 2200  cefTRIAXone (ROCEPHIN) 1 g in sodium chloride 0.9 % 100 mL IVPB        1 g 200 mL/hr over 30 Minutes Intravenous Every 24 hours 11/05/20 2315 11/10/20 2240   11/06/20 0100  remdesivir 200 mg in sodium chloride 0.9% 250 mL IVPB       "Followed by" Linked Group Details   200 mg 580 mL/hr over 30 Minutes Intravenous Once 11/05/20 2342 11/06/20 0128   11/05/20 2145  cefTRIAXone (ROCEPHIN) 1 g in sodium chloride 0.9 % 100 mL IVPB        1 g 200 mL/hr over 30 Minutes Intravenous  Once 11/05/20 2138 11/05/20 2217      Inpatient Medications  Scheduled Meds: . apixaban  5 mg Oral BID  . vitamin C  500 mg Oral Daily  . cholecalciferol  1,000 Units Oral Daily  . [START ON 11/17/2020] cyanocobalamin  1,000 mcg Subcutaneous Q28 days  . DULoxetine  60 mg Oral QHS  . estrogen (conjugated)-medroxyprogesterone  1 tablet Oral Daily  . famotidine  20 mg Oral Daily  . febuxostat  40 mg Oral QHS  . gabapentin  100 mg Oral Daily  . levothyroxine  200 mcg Oral Q0600  . pantoprazole  40 mg Oral QAC breakfast  . zinc sulfate  220 mg Oral Daily   Continuous Infusions:  PRN Meds:.acetaminophen **OR** acetaminophen, baclofen, HYDROcodone-acetaminophen, loperamide, magnesium hydroxide, ondansetron **OR** ondansetron (ZOFRAN) IV, traZODone   Time Spent in minutes  15    See all Orders from today for further details   Lala Lund M.D on 11/14/2020 at 11:10 AM  To page go to [email or web address] - use universal password  [facility] -  Office  [phone]    Objective:   Vitals:   11/13/20 0421 11/13/20 1300 11/13/20 2049 11/14/20 0450  BP: 129/82 124/82 (!) 155/68 140/67  Pulse: 78 81 79 79  Resp: 18 20 20 18    Temp: 98.1 F (36.7 C) 97.8 F (36.6 C) 97.7 F (36.5 C) (!) 97.2 F (36.2 C)  TempSrc: Axillary Oral Oral Oral  SpO2: 97% 97% 98% 99%  Weight:      Height:        Wt Readings from Last 3 Encounters:  11/05/20 57.6 kg  04/24/20 57.6 kg  04/15/20 58 kg     Intake/Output Summary (Last 24 hours) at 11/14/2020 1110 Last data filed at 11/14/2020 0956 Gross per 24 hour  Intake 60 ml  Output 300 ml  Net -240 ml     Physical Exam  Awake Alert, No new F.N deficits, Normal affect [place].AT,PERRAL Supple Neck,No JVD, No cervical lymphadenopathy appriciated.  Symmetrical Chest wall movement, Good air movement bilaterally, CTAB RRR,No Gallops, Rubs or new Murmurs, No Parasternal Heave +ve B.Sounds, Abd Soft, No tenderness, No organomegaly appriciated, No rebound - guarding or rigidity. No Cyanosis, chronic almost healed multiple bilateral lower extremity skin wounds noted     Data Review:    CBC Recent Labs  Lab 11/08/20 0115 11/09/20 0123 11/10/20 0203  WBC 7.7 6.8 7.5  HGB 11.8* 11.7* 11.4*  HCT 38.6 37.6 36.2  PLT 309 320 305  MCV 86.2 85.8 85.8  MCH 26.3 26.7 27.0  MCHC 30.6 31.1 31.5  RDW 18.9* 19.2* 19.9*  LYMPHSABS 1.8 2.2 2.1  MONOABS 0.7 0.5 0.5  EOSABS 0.1 0.3 0.3  BASOSABS 0.0 0.0 0.0    Chemistries  Recent Labs  Lab 11/08/20 0115 11/09/20 0123 11/10/20 0203 11/13/20 0106  NA 136 135 133* 137  K 4.3 3.9 3.9 4.0  CL 108 107 106 108  CO2 18* 19* 18* 17*  GLUCOSE 93 85 91 75  BUN 19 16 17 18   CREATININE 2.01* 1.98* 1.92* 2.00*  CALCIUM 8.1* 8.1* 8.0* 8.5*  AST 51* 40 33 30  ALT 19 17 17 17   ALKPHOS 73 70 67 61  BILITOT 0.4 0.4 0.4 0.8   ------------------------------------------------------------------------------------------------------------------ No results for input(s): CHOL, HDL, LDLCALC, TRIG, CHOLHDL, LDLDIRECT in the last 72 hours.  Lab Results  Component Value Date   HGBA1C 5.5 06/25/2018    ------------------------------------------------------------------------------------------------------------------ No results for input(s): TSH, T4TOTAL, T3FREE, THYROIDAB in the last 72 hours.  Invalid input(s): FREET3 ------------------------------------------------------------------------------------------------------------------ No results for input(s): VITAMINB12, FOLATE, FERRITIN, TIBC, IRON, RETICCTPCT in the last 72 hours.  Coagulation profile No results for input(s): INR, PROTIME in the last 168 hours.  Recent Labs    11/13/20 0106  DDIMER 2.58*    Cardiac Enzymes No results for input(s): CKMB, TROPONINI, MYOGLOBIN in the last 168 hours.  Invalid input(s): CK ------------------------------------------------------------------------------------------------------------------    Component Value Date/Time   BNP 90.3 11/06/2020 0003    Micro Results Recent Results (from the past 240 hour(s))  Culture, Urine     Status: Abnormal   Collection Time: 11/05/20  2:15 AM   Specimen: Urine, Random  Result Value Ref Range Status   Specimen Description URINE, RANDOM  Final   Special Requests   Final    NONE Performed at  [facility], [place] [address], [place], [place] [address]    Culture 70,000 COLONIES/mL PROTEUS MIRABILIS (A)  Final   Report Status 11/08/2020 FINAL  Final   Organism ID, Bacteria PROTEUS MIRABILIS (A)  Final      Susceptibility   Proteus mirabilis - MIC*    AMPICILLIN <=2 SENSITIVE Sensitive     CEFAZOLIN 8 SENSITIVE Sensitive     CEFEPIME <=0.12 SENSITIVE Sensitive     CEFTRIAXONE <=0.25 SENSITIVE Sensitive     CIPROFLOXACIN <=0.25 SENSITIVE Sensitive     GENTAMICIN <=1 SENSITIVE Sensitive     IMIPENEM 4 SENSITIVE Sensitive     NITROFURANTOIN 128 RESISTANT Resistant     TRIMETH/SULFA <=20 SENSITIVE Sensitive     AMPICILLIN/SULBACTAM <=2 SENSITIVE Sensitive     PIP/TAZO <=4 SENSITIVE Sensitive     * 70,000 COLONIES/mL PROTEUS MIRABILIS  SARS  CORONAVIRUS 2 (TAT 6-24 HRS) Nasopharyngeal Nasopharyngeal Swab     Status: Abnormal   Collection Time: 11/05/20  5:08 PM   Specimen: Nasopharyngeal Swab  Result Value Ref Range Status   SARS Coronavirus 2 POSITIVE (A) NEGATIVE Final    Comment: (NOTE) SARS-CoV-2 target nucleic acids are DETECTED.  The SARS-CoV-2 RNA is generally detectable in upper and lower respiratory specimens during the acute phase of infection. Positive results are indicative of the presence of SARS-CoV-2 RNA. Clinical correlation with patient history and other diagnostic information is  necessary to determine patient infection status. Positive results do not rule out bacterial infection or co-infection with other viruses.  The expected result is Negative.  Fact Sheet for Patients: SugarRoll.be  Fact Sheet for Healthcare Providers: https://www.woods-mathews.com/  This test is not yet approved or cleared by the [place] FDA and  has been authorized for detection and/or diagnosis of SARS-CoV-2 by FDA under an Emergency Use Authorization (EUA). This EUA will remain  in effect (meaning this test can be used) for the duration of the COVID-19 declaration under Section 564(b)(1) of the Act, 21 U. S.C. section 360bbb-3(b)(1), unless the authorization is terminated or revoked sooner.   Performed at [facility], [place] [address], [place], [place] [address]     Radiology Reports DG Chest [place] 1 View  Result Date: 11/05/2020 CLINICAL DATA:  General weakness EXAM: PORTABLE CHEST 1 VIEW COMPARISON:  None. FINDINGS: Mildly diminished lung volumes. No consolidation or effusion. Normal cardiomediastinal silhouette. No pneumothorax. Opacity over the right lower lung felt secondary to healing right seventh posterior rib fracture. IMPRESSION: No active disease. Probable healing right seventh posterior rib fracture. Electronically Signed   By: Donavan Foil M.D.   On:  11/05/2020 17:58   VAS [place] LOWER EXTREMITY VENOUS (DVT)  Result Date: 11/12/2020  Lower Venous DVT Study Other Indications: D-Dimer. Risk Factors: None identified. Anticoagulation: Lovenox. Comparison Study: Prev 03/2019 Negative Performing Technologist: Vonzell Schlatter RVT  Examination Guidelines: A complete evaluation includes B-mode imaging, spectral Doppler, color Doppler, and power Doppler as needed of all accessible portions of each vessel. Bilateral testing is considered an integral part of a complete examination. Limited examinations for reoccurring indications may be performed as noted. The reflux portion of the exam is performed with the patient in reverse Trendelenburg.  +---------+---------------+---------+-----------+----------+--------------+ RIGHT    CompressibilityPhasicitySpontaneityPropertiesThrombus Aging +---------+---------------+---------+-----------+----------+--------------+ CFV      Full                                                        +---------+---------------+---------+-----------+----------+--------------+  SFJ      Full                                                        +---------+---------------+---------+-----------+----------+--------------+ FV Prox  Partial                                      Chronic        +---------+---------------+---------+-----------+----------+--------------+ FV Mid   Partial                                      Chronic        +---------+---------------+---------+-----------+----------+--------------+ FV DistalPartial                                      Chronic        +---------+---------------+---------+-----------+----------+--------------+ PFV      Full                                                        +---------+---------------+---------+-----------+----------+--------------+ POP      Full           Yes      Yes                                  +---------+---------------+---------+-----------+----------+--------------+ PTV      Full                                                        +---------+---------------+---------+-----------+----------+--------------+ PERO     Full                                                        +---------+---------------+---------+-----------+----------+--------------+   +---------+---------------+---------+-----------+----------+--------------+ LEFT     CompressibilityPhasicitySpontaneityPropertiesThrombus Aging +---------+---------------+---------+-----------+----------+--------------+ CFV      Full           Yes      Yes                                 +---------+---------------+---------+-----------+----------+--------------+ SFJ      Full                                                        +---------+---------------+---------+-----------+----------+--------------+ FV Prox  Full                                                        +---------+---------------+---------+-----------+----------+--------------+  FV Mid   Full                                                        +---------+---------------+---------+-----------+----------+--------------+ FV DistalFull                                                        +---------+---------------+---------+-----------+----------+--------------+ PFV      Full                                                        +---------+---------------+---------+-----------+----------+--------------+ POP      Full           Yes      Yes                                 +---------+---------------+---------+-----------+----------+--------------+ PTV      Full                                                        +---------+---------------+---------+-----------+----------+--------------+ PERO     Full                                                         +---------+---------------+---------+-----------+----------+--------------+  Summary: RIGHT: - Findings consistent with 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 Femoral vein appears post phlebitic no acute segments visualized. - No cystic structure found in the popliteal fossa.  LEFT: - There is no evidence of deep vein thrombosis in the lower extremity.  - No cystic structure found in the popliteal fossa.  *See table(s) above for measurements and observations. Electronically signed by Servando Snare MD on 11/12/2020 at 1:57:12 PM.    Final

## 2020-11-15 DIAGNOSIS — R109 Unspecified abdominal pain: Secondary | ICD-10-CM | POA: Diagnosis not present

## 2020-11-15 DIAGNOSIS — M2550 Pain in unspecified joint: Secondary | ICD-10-CM | POA: Diagnosis not present

## 2020-11-15 DIAGNOSIS — M6259 Muscle wasting and atrophy, not elsewhere classified, multiple sites: Secondary | ICD-10-CM | POA: Diagnosis not present

## 2020-11-15 DIAGNOSIS — Z20822 Contact with and (suspected) exposure to covid-19: Secondary | ICD-10-CM | POA: Diagnosis not present

## 2020-11-15 DIAGNOSIS — R2681 Unsteadiness on feet: Secondary | ICD-10-CM | POA: Diagnosis not present

## 2020-11-15 DIAGNOSIS — U071 COVID-19: Secondary | ICD-10-CM | POA: Diagnosis not present

## 2020-11-15 DIAGNOSIS — Z8616 Personal history of COVID-19: Secondary | ICD-10-CM | POA: Diagnosis not present

## 2020-11-15 DIAGNOSIS — N959 Unspecified menopausal and perimenopausal disorder: Secondary | ICD-10-CM | POA: Diagnosis not present

## 2020-11-15 DIAGNOSIS — M7989 Other specified soft tissue disorders: Secondary | ICD-10-CM | POA: Diagnosis not present

## 2020-11-15 DIAGNOSIS — L03113 Cellulitis of right upper limb: Secondary | ICD-10-CM | POA: Diagnosis not present

## 2020-11-15 DIAGNOSIS — N179 Acute kidney failure, unspecified: Secondary | ICD-10-CM | POA: Diagnosis not present

## 2020-11-15 DIAGNOSIS — G9009 Other idiopathic peripheral autonomic neuropathy: Secondary | ICD-10-CM | POA: Diagnosis not present

## 2020-11-15 DIAGNOSIS — R413 Other amnesia: Secondary | ICD-10-CM | POA: Diagnosis present

## 2020-11-15 DIAGNOSIS — E039 Hypothyroidism, unspecified: Secondary | ICD-10-CM | POA: Diagnosis not present

## 2020-11-15 DIAGNOSIS — M109 Gout, unspecified: Secondary | ICD-10-CM | POA: Diagnosis not present

## 2020-11-15 DIAGNOSIS — M069 Rheumatoid arthritis, unspecified: Secondary | ICD-10-CM | POA: Diagnosis not present

## 2020-11-15 DIAGNOSIS — Z87442 Personal history of urinary calculi: Secondary | ICD-10-CM | POA: Diagnosis not present

## 2020-11-15 DIAGNOSIS — Z515 Encounter for palliative care: Secondary | ICD-10-CM | POA: Diagnosis not present

## 2020-11-15 DIAGNOSIS — R609 Edema, unspecified: Secondary | ICD-10-CM | POA: Diagnosis not present

## 2020-11-15 DIAGNOSIS — R0602 Shortness of breath: Secondary | ICD-10-CM | POA: Diagnosis not present

## 2020-11-15 DIAGNOSIS — R278 Other lack of coordination: Secondary | ICD-10-CM | POA: Diagnosis not present

## 2020-11-15 DIAGNOSIS — G894 Chronic pain syndrome: Secondary | ICD-10-CM | POA: Diagnosis not present

## 2020-11-15 DIAGNOSIS — F418 Other specified anxiety disorders: Secondary | ICD-10-CM | POA: Diagnosis not present

## 2020-11-15 DIAGNOSIS — Z7401 Bed confinement status: Secondary | ICD-10-CM | POA: Diagnosis not present

## 2020-11-15 DIAGNOSIS — Z823 Family history of stroke: Secondary | ICD-10-CM | POA: Diagnosis not present

## 2020-11-15 DIAGNOSIS — J45909 Unspecified asthma, uncomplicated: Secondary | ICD-10-CM | POA: Diagnosis not present

## 2020-11-15 DIAGNOSIS — N184 Chronic kidney disease, stage 4 (severe): Secondary | ICD-10-CM | POA: Diagnosis not present

## 2020-11-15 DIAGNOSIS — J189 Pneumonia, unspecified organism: Secondary | ICD-10-CM | POA: Diagnosis not present

## 2020-11-15 DIAGNOSIS — D631 Anemia in chronic kidney disease: Secondary | ICD-10-CM | POA: Diagnosis present

## 2020-11-15 DIAGNOSIS — I82511 Chronic embolism and thrombosis of right femoral vein: Secondary | ICD-10-CM | POA: Diagnosis not present

## 2020-11-15 DIAGNOSIS — J90 Pleural effusion, not elsewhere classified: Secondary | ICD-10-CM | POA: Diagnosis not present

## 2020-11-15 DIAGNOSIS — R0689 Other abnormalities of breathing: Secondary | ICD-10-CM | POA: Diagnosis not present

## 2020-11-15 DIAGNOSIS — F32A Depression, unspecified: Secondary | ICD-10-CM | POA: Diagnosis not present

## 2020-11-15 DIAGNOSIS — I82411 Acute embolism and thrombosis of right femoral vein: Secondary | ICD-10-CM | POA: Diagnosis not present

## 2020-11-15 DIAGNOSIS — K567 Ileus, unspecified: Secondary | ICD-10-CM | POA: Diagnosis not present

## 2020-11-15 DIAGNOSIS — L0390 Cellulitis, unspecified: Secondary | ICD-10-CM | POA: Diagnosis not present

## 2020-11-15 DIAGNOSIS — K219 Gastro-esophageal reflux disease without esophagitis: Secondary | ICD-10-CM | POA: Diagnosis not present

## 2020-11-15 DIAGNOSIS — F419 Anxiety disorder, unspecified: Secondary | ICD-10-CM | POA: Diagnosis present

## 2020-11-15 DIAGNOSIS — E119 Type 2 diabetes mellitus without complications: Secondary | ICD-10-CM | POA: Diagnosis not present

## 2020-11-15 DIAGNOSIS — R41841 Cognitive communication deficit: Secondary | ICD-10-CM | POA: Diagnosis not present

## 2020-11-15 DIAGNOSIS — R2689 Other abnormalities of gait and mobility: Secondary | ICD-10-CM | POA: Diagnosis not present

## 2020-11-15 DIAGNOSIS — G8929 Other chronic pain: Secondary | ICD-10-CM | POA: Diagnosis not present

## 2020-11-15 DIAGNOSIS — I130 Hypertensive heart and chronic kidney disease with heart failure and stage 1 through stage 4 chronic kidney disease, or unspecified chronic kidney disease: Secondary | ICD-10-CM | POA: Diagnosis not present

## 2020-11-15 DIAGNOSIS — E872 Acidosis: Secondary | ICD-10-CM | POA: Diagnosis not present

## 2020-11-15 DIAGNOSIS — R7889 Finding of other specified substances, not normally found in blood: Secondary | ICD-10-CM | POA: Diagnosis not present

## 2020-11-15 DIAGNOSIS — M21921 Unspecified acquired deformity of right upper arm: Secondary | ICD-10-CM | POA: Diagnosis not present

## 2020-11-15 DIAGNOSIS — R8290 Unspecified abnormal findings in urine: Secondary | ICD-10-CM | POA: Diagnosis present

## 2020-11-15 DIAGNOSIS — I10 Essential (primary) hypertension: Secondary | ICD-10-CM | POA: Diagnosis not present

## 2020-11-15 DIAGNOSIS — R935 Abnormal findings on diagnostic imaging of other abdominal regions, including retroperitoneum: Secondary | ICD-10-CM | POA: Diagnosis not present

## 2020-11-15 DIAGNOSIS — R1013 Epigastric pain: Secondary | ICD-10-CM | POA: Diagnosis not present

## 2020-11-15 DIAGNOSIS — N3944 Nocturnal enuresis: Secondary | ICD-10-CM | POA: Diagnosis present

## 2020-11-15 DIAGNOSIS — I639 Cerebral infarction, unspecified: Secondary | ICD-10-CM | POA: Diagnosis not present

## 2020-11-15 DIAGNOSIS — M1990 Unspecified osteoarthritis, unspecified site: Secondary | ICD-10-CM | POA: Diagnosis not present

## 2020-11-15 DIAGNOSIS — I700 Atherosclerosis of aorta: Secondary | ICD-10-CM | POA: Diagnosis not present

## 2020-11-15 DIAGNOSIS — R1312 Dysphagia, oropharyngeal phase: Secondary | ICD-10-CM | POA: Diagnosis not present

## 2020-11-15 DIAGNOSIS — D509 Iron deficiency anemia, unspecified: Secondary | ICD-10-CM | POA: Diagnosis present

## 2020-11-15 DIAGNOSIS — R627 Adult failure to thrive: Secondary | ICD-10-CM | POA: Diagnosis not present

## 2020-11-15 DIAGNOSIS — I5032 Chronic diastolic (congestive) heart failure: Secondary | ICD-10-CM | POA: Diagnosis not present

## 2020-11-15 DIAGNOSIS — R531 Weakness: Secondary | ICD-10-CM | POA: Diagnosis not present

## 2020-11-15 DIAGNOSIS — R140 Abdominal distension (gaseous): Secondary | ICD-10-CM | POA: Diagnosis not present

## 2020-11-15 DIAGNOSIS — A419 Sepsis, unspecified organism: Secondary | ICD-10-CM | POA: Diagnosis not present

## 2020-11-15 DIAGNOSIS — M1711 Unilateral primary osteoarthritis, right knee: Secondary | ICD-10-CM | POA: Diagnosis not present

## 2020-11-15 DIAGNOSIS — M6281 Muscle weakness (generalized): Secondary | ICD-10-CM | POA: Diagnosis not present

## 2020-11-15 DIAGNOSIS — I129 Hypertensive chronic kidney disease with stage 1 through stage 4 chronic kidney disease, or unspecified chronic kidney disease: Secondary | ICD-10-CM | POA: Diagnosis not present

## 2020-11-15 DIAGNOSIS — M1991 Primary osteoarthritis, unspecified site: Secondary | ICD-10-CM | POA: Diagnosis not present

## 2020-11-15 DIAGNOSIS — I5031 Acute diastolic (congestive) heart failure: Secondary | ICD-10-CM | POA: Diagnosis not present

## 2020-11-15 DIAGNOSIS — Z8673 Personal history of transient ischemic attack (TIA), and cerebral infarction without residual deficits: Secondary | ICD-10-CM | POA: Diagnosis not present

## 2020-11-15 DIAGNOSIS — M1A9XX0 Chronic gout, unspecified, without tophus (tophi): Secondary | ICD-10-CM | POA: Diagnosis not present

## 2020-11-15 DIAGNOSIS — Z9049 Acquired absence of other specified parts of digestive tract: Secondary | ICD-10-CM | POA: Diagnosis not present

## 2020-11-15 DIAGNOSIS — R54 Age-related physical debility: Secondary | ICD-10-CM | POA: Diagnosis not present

## 2020-11-15 DIAGNOSIS — R5381 Other malaise: Secondary | ICD-10-CM | POA: Diagnosis not present

## 2020-11-15 LAB — CBC WITH DIFFERENTIAL/PLATELET
Abs Immature Granulocytes: 0.08 10*3/uL — ABNORMAL HIGH (ref 0.00–0.07)
Basophils Absolute: 0.1 10*3/uL (ref 0.0–0.1)
Basophils Relative: 1 %
Eosinophils Absolute: 0.5 10*3/uL (ref 0.0–0.5)
Eosinophils Relative: 5 %
HCT: 35.4 % — ABNORMAL LOW (ref 36.0–46.0)
Hemoglobin: 10.8 g/dL — ABNORMAL LOW (ref 12.0–15.0)
Immature Granulocytes: 1 %
Lymphocytes Relative: 24 %
Lymphs Abs: 2.3 10*3/uL (ref 0.7–4.0)
MCH: 26.5 pg (ref 26.0–34.0)
MCHC: 30.5 g/dL (ref 30.0–36.0)
MCV: 86.8 fL (ref 80.0–100.0)
Monocytes Absolute: 0.8 10*3/uL (ref 0.1–1.0)
Monocytes Relative: 9 %
Neutro Abs: 5.9 10*3/uL (ref 1.7–7.7)
Neutrophils Relative %: 60 %
Platelets: 408 10*3/uL — ABNORMAL HIGH (ref 150–400)
RBC: 4.08 MIL/uL (ref 3.87–5.11)
RDW: 19.6 % — ABNORMAL HIGH (ref 11.5–15.5)
WBC: 9.7 10*3/uL (ref 4.0–10.5)
nRBC: 0 % (ref 0.0–0.2)

## 2020-11-15 LAB — COMPREHENSIVE METABOLIC PANEL
ALT: 15 U/L (ref 0–44)
AST: 19 U/L (ref 15–41)
Albumin: 2.5 g/dL — ABNORMAL LOW (ref 3.5–5.0)
Alkaline Phosphatase: 63 U/L (ref 38–126)
Anion gap: 8 (ref 5–15)
BUN: 22 mg/dL (ref 8–23)
CO2: 20 mmol/L — ABNORMAL LOW (ref 22–32)
Calcium: 8.9 mg/dL (ref 8.9–10.3)
Chloride: 108 mmol/L (ref 98–111)
Creatinine, Ser: 2.29 mg/dL — ABNORMAL HIGH (ref 0.44–1.00)
GFR, Estimated: 22 mL/min — ABNORMAL LOW (ref >60)
Glucose, Bld: 85 mg/dL (ref 70–99)
Potassium: 4.2 mmol/L (ref 3.5–5.1)
Sodium: 136 mmol/L (ref 135–145)
Total Bilirubin: 0.5 mg/dL (ref 0.3–1.2)
Total Protein: 4.9 g/dL — ABNORMAL LOW (ref 6.5–8.1)

## 2020-11-15 LAB — C-REACTIVE PROTEIN: CRP: 0.7 mg/dL (ref <1.0)

## 2020-11-15 LAB — MAGNESIUM: Magnesium: 2.2 mg/dL (ref 1.7–2.4)

## 2020-11-15 LAB — BRAIN NATRIURETIC PEPTIDE: B Natriuretic Peptide: 73.8 pg/mL (ref 0.0–100.0)

## 2020-11-15 MED ORDER — APIXABAN 5 MG PO TABS: 5.0000 mg | ORAL_TABLET | Freq: Two times a day (BID) | ORAL | Status: AC

## 2020-11-15 MED ORDER — HYDROCODONE-ACETAMINOPHEN 5-325 MG PO TABS: 1.0000 | ORAL_TABLET | Freq: Two times a day (BID) | ORAL | 0 refills | Status: AC | PRN

## 2020-11-15 NOTE — TOC Transition Note (Signed)
Transition of Care [facility]) - CM/SW Discharge Note   Patient Details  Name: Carolyn Robertson MRN: [id number] Date of Birth: [birth year]  [facility] CM/SW Contact:  Gabrielle Dare Phone Number: 11/15/2020, 11:54 AM   Clinical Narrative:    Patient will Discharge To: [facility] Anticipated DC Date:11/15/20 Family Notified:yes, daughter Khaliah Barnick, [phone] Transport XY:DSWV  Per MD patient ready for DC to [facility] . RN, patient, patient's family, and facility notified of DC. Assessment, Fl2/Pasrr, and Discharge Summary sent to facility. RN given number for report [phone], Room # [phone]). DC packet on chart. Ambulance transport requested for patient.   CSW signing off.  Reed Breech LCSWA [phone]     Final next level of care: Skilled Nursing Facility Barriers to Discharge: No Barriers Identified   Patient Goals and CMS Choice        Discharge Placement              Patient chooses bed at: [facility] Patient to be transferred to facility by: [facility] Name of family member notified: Regan Lemming Patient and family notified of of transfer: 11/15/20  Discharge Plan and Services                                     Social Determinants of Health (SDOH) Interventions     Readmission Risk Interventions No flowsheet data found.

## 2020-11-15 NOTE — Progress Notes (Addendum)
PROGRESS NOTE                                                                                                                                                                                                             Patient Demographics:    Carolyn Robertson, is a 75 y.o. female, DOB - 04-23-1946, BPZ:[id number]  Outpatient Primary MD for the patient is Burnard Bunting, MD   Admit date - 11/05/2020   LOS - 10  Chief Complaint  Patient presents with  . Weakness  . Failure To Thrive       Brief Narrative: Patient is a 75 y.o. female with PMHx of hypothyroidism, stage IV CKD, CVA, asthma-presented with generalized weakness, dysuria and frequent falls.  Found to have UTI and breakthrough COVID-19 infection.  See below for further details.  COVID-19 vaccinated status: Vaccinated including booster.  Significant Events: 1/20>> Admit to [facility] for weakness/falls-with UTI and breakthrough COVID-19 infection.  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 No focal weakness.    Assessment  & Plan :   Sepsis secondary to Proteus UTI : Clinically improved-sepsis physiology has resolved-completed ABX x5 days.    AKI on CKD stage IV : AKI hemodynamically mediated-improved with IVF-creatinine at baseline.  Avoid nephrotoxic agents.    Breakthrough COVID-19 infection : No Respiratory symptoms-no hypoxia-chest x-ray negative-has completed 3 days of Remdesivir.  Needs 10 days of  isolation-last day on 1/29.  O2 requirements:  SpO2: 99 % O2 Flow Rate (L/min): 2 L/min     Recent Labs  Lab 11/09/20 0123 11/10/20 0203 11/13/20 0106 11/15/20 0257  WBC 6.8 7.5  --  9.7  HGB 11.7* 11.4*  --  10.8*  HCT 37.6 36.2  --  35.4*  PLT 320 305  --  408*  CRP 3.0* 2.1*  --  0.7  BNP  --   --   --  73.8  DDIMER 2.70* 2.70* 2.58*  --  AST 40 33 30 19  ALT 17 17 17 15   ALKPHOS 70 67 61 63  BILITOT 0.4 0.4 0.8 0.5  ALBUMIN 2.2* 2.2* 2.6* 2.5*      Elevated D-dimer-chronic right femoral vein DVT: Very minimally ambulatory at baseline-at risk for propagation of DVT-continue Eliquis.  HTN: BP stable-antihypertensives on hold-we will resume when able over the next few days.  Hypothyroidism: TSH 227!  She acknowledges not taking Synthroid for the past several months-have slowly increased levothyroxine back to 200 mcg.  Repeat TSH in 3 months.  Hypokalemia: Repleted.  GERD: Continue PPI  Depression with anxiety: Stable-continue Cymbalta/mirtazapine  Gout: Continue Febuxostat  Debility/deconditioning/frequent falls: Chronic weakness +  acute decline now , mostly wheelchair-bound and bathroom with help and walker for several years according to the husband, now acute declining with COVID/UTI-PT with recommendations for SNF.  Likely severe deconditioning due to severe hypothyroidism, social worker following-apparently will be able to go to SNF soon ??.    GI prophylaxis: PPI   Condition - Fair  Family Communication  :  Spouse - Hubert-[phone] on 11/12/20, 11/15/20  Code Status :  Full Code  Diet :  Diet Order            Diet Heart Room service appropriate? Yes; Fluid consistency: Thin  Diet effective now                  Disposition Plan  :  Status is: Inpatient  Remains inpatient appropriate because:Inpatient level of care appropriate due to severity of illness  Dispo: The patient is from: Home              Anticipated d/c is to: SNF.               Anticipated d/c date is: > 1 days              Patient currently is medically stable to d/c.   Barriers to discharge: awaiting SNF bed-Per social work-plan to d/d to SNF on 1/30  Antimicorbials  :    Anti-infectives (From admission, onward)   Start     Dose/Rate Route Frequency Ordered Stop   11/07/20 1000  remdesivir 100 mg in sodium chloride 0.9 % 100 mL IVPB       "Followed by" Linked Group Details   100 mg 200 mL/hr over 30 Minutes Intravenous Daily 11/05/20 2342 11/08/20 1047   11/06/20 2200  cefTRIAXone (ROCEPHIN) 1 g in sodium chloride 0.9 % 100 mL IVPB        1 g 200 mL/hr over 30 Minutes Intravenous Every 24 hours 11/05/20 2315 11/10/20 2240   11/06/20 0100  remdesivir 200 mg in sodium chloride 0.9% 250 mL IVPB       "Followed by" Linked Group Details   200 mg 580 mL/hr over 30 Minutes Intravenous Once 11/05/20 2342 11/06/20 0128   11/05/20 2145  cefTRIAXone (ROCEPHIN) 1 g in sodium chloride 0.9 % 100 mL IVPB        1 g 200 mL/hr over 30 Minutes Intravenous  Once 11/05/20 2138 11/05/20 2217      Inpatient Medications  Scheduled Meds: . apixaban  5 mg Oral BID  . vitamin C  500 mg Oral Daily  . cholecalciferol  1,000 Units Oral Daily  . [START ON 11/17/2020] cyanocobalamin  1,000 mcg Subcutaneous Q28 days  . DULoxetine  60 mg Oral QHS  . estrogen (conjugated)-medroxyprogesterone  1 tablet Oral Daily  . famotidine  20 mg Oral  Daily  . febuxostat  40 mg Oral QHS  . gabapentin  100 mg Oral Daily  . levothyroxine  200 mcg Oral Q0600  . pantoprazole  40 mg Oral QAC breakfast  . zinc sulfate  220 mg Oral Daily   Continuous Infusions:  PRN Meds:.acetaminophen **OR** acetaminophen, baclofen, HYDROcodone-acetaminophen, loperamide, magnesium hydroxide, ondansetron **OR** ondansetron (ZOFRAN) IV, traZODone   Time Spent in minutes  15    See all Orders from today for further details   Lala Lund M.D on 11/15/2020 at 9:58 AM  To page go to [email or web address] - use  universal password  [facility] -  Office  [phone]    Objective:   Vitals:   11/14/20 0450 11/14/20 1443 11/14/20 2120 11/15/20 0435  BP: 140/67 (!) 144/70 (!) 157/91 (!) 126/97  Pulse: 79 80 100 78  Resp: 18 18 20 17   Temp: (!) 97.2 F (36.2 C) 98.1 F (36.7 C) 97.9 F (36.6 C) 97.8 F (36.6 C)  TempSrc: Oral Axillary Oral Axillary  SpO2: 99% 99% 97% 99%  Weight:      Height:        Wt Readings from Last 3 Encounters:  11/05/20 57.6 kg  04/24/20 57.6 kg  04/15/20 58 kg     Intake/Output Summary (Last 24 hours) at 11/15/2020 0958 Last data filed at 11/14/2020 1640 Gross per 24 hour  Intake 240 ml  Output 500 ml  Net -260 ml     Physical Exam  Awake Alert, No new F.N deficits, Normal affect [place].AT,PERRAL Supple Neck,No JVD, No cervical lymphadenopathy appriciated.  Symmetrical Chest wall movement, Good air movement bilaterally, CTAB RRR,No Gallops, Rubs or new Murmurs, No Parasternal Heave +ve B.Sounds, Abd Soft, No tenderness, No organomegaly appriciated, No rebound - guarding or rigidity. No Cyanosis, chronic almost healed multiple bilateral lower extremity skin wounds noted     Data Review:    CBC Recent Labs  Lab 11/09/20 0123 11/10/20 0203 11/15/20 0257  WBC 6.8 7.5 9.7  HGB 11.7* 11.4* 10.8*  HCT 37.6 36.2 35.4*  PLT 320 305 408*  MCV 85.8 85.8 86.8  MCH 26.7 27.0 26.5  MCHC 31.1 31.5 30.5  RDW 19.2* 19.9* 19.6*  LYMPHSABS 2.2 2.1 2.3  MONOABS 0.5 0.5 0.8  EOSABS 0.3 0.3 0.5  BASOSABS 0.0 0.0 0.1    Chemistries  Recent Labs  Lab 11/09/20 0123 11/10/20 0203 11/13/20 0106 11/15/20 0257  NA 135 133* 137 136  K 3.9 3.9 4.0 4.2  CL 107 106 108 108  CO2 19* 18* 17* 20*  GLUCOSE 85 91 75 85  BUN 16 17 18 22   CREATININE 1.98* 1.92* 2.00* 2.29*  CALCIUM 8.1* 8.0* 8.5* 8.9  MG  --   --   --  2.2  AST 40 33 30 19  ALT 17 17 17 15   ALKPHOS 70 67 61 63  BILITOT 0.4 0.4 0.8 0.5    ------------------------------------------------------------------------------------------------------------------ No results for input(s): CHOL, HDL, LDLCALC, TRIG, CHOLHDL, LDLDIRECT in the last 72 hours.  Lab Results  Component Value Date   HGBA1C 5.5 06/25/2018   ------------------------------------------------------------------------------------------------------------------ No results for input(s): TSH, T4TOTAL, T3FREE, THYROIDAB in the last 72 hours.  Invalid input(s): FREET3 ------------------------------------------------------------------------------------------------------------------ No results for input(s): VITAMINB12, FOLATE, FERRITIN, TIBC, IRON, RETICCTPCT in the last 72 hours.  Coagulation profile No results for input(s): INR, PROTIME in the last 168 hours.  Recent Labs    11/13/20 0106  DDIMER 2.58*    Cardiac Enzymes No results for input(s): CKMB, TROPONINI,  MYOGLOBIN in the last 168 hours.  Invalid input(s): CK ------------------------------------------------------------------------------------------------------------------    Component Value Date/Time   BNP 73.8 11/15/2020 0257    Micro Results Recent Results (from the past 240 hour(s))  SARS CORONAVIRUS 2 (TAT 6-24 HRS) Nasopharyngeal Nasopharyngeal Swab     Status: Abnormal   Collection Time: 11/05/20  5:08 PM   Specimen: Nasopharyngeal Swab  Result Value Ref Range Status   SARS Coronavirus 2 POSITIVE (A) NEGATIVE Final    Comment: (NOTE) SARS-CoV-2 target nucleic acids are DETECTED.  The SARS-CoV-2 RNA is generally detectable in upper and lower respiratory specimens during the acute phase of infection. Positive results are indicative of the presence of SARS-CoV-2 RNA. Clinical correlation with patient history and other diagnostic information is  necessary to determine patient infection status. Positive results do not rule out bacterial infection or co-infection with other viruses.  The  expected result is Negative.  Fact Sheet for Patients: SugarRoll.be  Fact Sheet for Healthcare Providers: https://www.woods-mathews.com/  This test is not yet approved or cleared by the [place] FDA and  has been authorized for detection and/or diagnosis of SARS-CoV-2 by FDA under an Emergency Use Authorization (EUA). This EUA will remain  in effect (meaning this test can be used) for the duration of the COVID-19 declaration under Section 564(b)(1) of the Act, 21 U. S.C. section 360bbb-3(b)(1), unless the authorization is terminated or revoked sooner.   Performed at [facility], [place] [address] [place],  [address]     Radiology Reports DG Chest [place] 1 View  Result Date: 11/05/2020 CLINICAL DATA:  General weakness EXAM: PORTABLE CHEST 1 VIEW COMPARISON:  None. FINDINGS: Mildly diminished lung volumes. No consolidation or effusion. Normal cardiomediastinal silhouette. No pneumothorax. Opacity over the right lower lung felt secondary to healing right seventh posterior rib fracture. IMPRESSION: No active disease. Probable healing right seventh posterior rib fracture. Electronically Signed   By: Donavan Foil M.D.   On: 11/05/2020 17:58   VAS [place] LOWER EXTREMITY VENOUS (DVT)  Result Date: 11/12/2020  Lower Venous DVT Study Other Indications: D-Dimer. Risk Factors: None identified. Anticoagulation: Lovenox. Comparison Study: Prev 03/2019 Negative Performing Technologist: Vonzell Schlatter RVT  Examination Guidelines: A complete evaluation includes B-mode imaging, spectral Doppler, color Doppler, and power Doppler as needed of all accessible portions of each vessel. Bilateral testing is considered an integral part of a complete examination. Limited examinations for reoccurring indications may be performed as noted. The reflux portion of the exam is performed with the patient in reverse Trendelenburg.   +---------+---------------+---------+-----------+----------+--------------+ RIGHT    CompressibilityPhasicitySpontaneityPropertiesThrombus Aging +---------+---------------+---------+-----------+----------+--------------+ CFV      Full                                                        +---------+---------------+---------+-----------+----------+--------------+ SFJ      Full                                                        +---------+---------------+---------+-----------+----------+--------------+ FV Prox  Partial  Chronic        +---------+---------------+---------+-----------+----------+--------------+ FV Mid   Partial                                      Chronic        +---------+---------------+---------+-----------+----------+--------------+ FV DistalPartial                                      Chronic        +---------+---------------+---------+-----------+----------+--------------+ PFV      Full                                                        +---------+---------------+---------+-----------+----------+--------------+ POP      Full           Yes      Yes                                 +---------+---------------+---------+-----------+----------+--------------+ PTV      Full                                                        +---------+---------------+---------+-----------+----------+--------------+ PERO     Full                                                        +---------+---------------+---------+-----------+----------+--------------+   +---------+---------------+---------+-----------+----------+--------------+ LEFT     CompressibilityPhasicitySpontaneityPropertiesThrombus Aging +---------+---------------+---------+-----------+----------+--------------+ CFV      Full           Yes      Yes                                  +---------+---------------+---------+-----------+----------+--------------+ SFJ      Full                                                        +---------+---------------+---------+-----------+----------+--------------+ FV Prox  Full                                                        +---------+---------------+---------+-----------+----------+--------------+ FV Mid   Full                                                        +---------+---------------+---------+-----------+----------+--------------+  FV DistalFull                                                        +---------+---------------+---------+-----------+----------+--------------+ PFV      Full                                                        +---------+---------------+---------+-----------+----------+--------------+ POP      Full           Yes      Yes                                 +---------+---------------+---------+-----------+----------+--------------+ PTV      Full                                                        +---------+---------------+---------+-----------+----------+--------------+ PERO     Full                                                        +---------+---------------+---------+-----------+----------+--------------+  Summary: RIGHT: - Findings consistent with 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 Femoral vein appears post phlebitic no acute segments visualized. - No cystic structure found in the popliteal fossa.  LEFT: - There is no evidence of deep vein thrombosis in the lower extremity.  - No cystic structure found in the popliteal fossa.  *See table(s) above for measurements and observations. Electronically signed by Servando Snare MD on 11/12/2020 at 1:57:12 PM.    Final

## 2020-11-15 NOTE — Progress Notes (Signed)
Carolyn Robertson to be D/C'd to [facility] per MD order. Report given at [facility], [place]. Skin clean and dry with scattered abrasions and bruising and stage 2 on R buttock.  IV catheter discontinued intact. Site without signs and symptoms of complications. Dressing and pressure applied.  An After Visit Summary was printed and given to the patient.  Patient escorted via stretcher, and D/C to [facility] via [place].  Melonie Florida  11/15/2020 1:24 PM

## 2020-11-15 NOTE — Discharge Summary (Signed)
Carolyn Robertson XJO:[id number] DOB: 10-Apr-1946 DOA: 11/05/2020  PCP: Burnard Bunting, MD  Admit date: 11/05/2020  Discharge date: 11/15/2020  Admitted From: Home  Disposition:  SNF   Recommendations for Outpatient Follow-up:   Follow up with PCP in 1-2 weeks  PCP Please obtain BMP/CBC, 2 view CXR in 1week,  (see Discharge instructions)   PCP Please follow up on the following pending results: Check CBC, CMP, TSH, free T4, T3, 2 view chest x-ray in 2 weeks.   Home Health: None   Equipment/Devices: None  Consultations: None  Discharge Condition: Stable    CODE STATUS: Full    Diet Recommendation: Heart Healthy   Diet Order            Diet - low sodium heart healthy           Diet Heart Room service appropriate? Yes; Fluid consistency: Thin  Diet effective now                  Chief Complaint  Patient presents with  . Weakness  . Failure To Thrive     Brief history of present illness from the day of admission and additional interim summary    Patient is a 75 y.o. female with PMHx of hypothyroidism, stage IV CKD, CVA, asthma-presented with generalized weakness, dysuria and frequent falls.  Found to have UTI and breakthrough COVID-19 infection.  See below for further details.  COVID-19 vaccinated status: Vaccinated including booster.  Significant Events: 1/20>> Admit to [facility] for weakness/falls-with UTI and breakthrough COVID-19 infection.  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    AKI on CKD stage IV : AKI hemodynamically mediated-improved with IVF-creatinine at baseline close to 2.5, home dose diuretic resumed upon discharge, follow BMP closely at SNF.    Breakthrough COVID-19 infection : No Respiratory symptoms-no hypoxia-chest x-ray negative-has completed 3 days of Remdesivir.  Needs 10 days of isolation-last day on 1/29.  SpO2: 99 % O2 Flow Rate (L/min): 2 L/min  Recent Labs  Lab 11/09/20 0123 11/10/20 0203 11/13/20 0106 11/15/20 0257  WBC 6.8 7.5  --  9.7  CRP 3.0* 2.1*  --  0.7  DDIMER 2.70* 2.70* 2.58*  --   BNP  --   --   --  73.8  AST 40 33 30 19  ALT 17 17 17 15   ALKPHOS 70 67 61 63  BILITOT 0.4 0.4 0.8 0.5  ALBUMIN 2.2* 2.2* 2.6* 2.5*  Elevated D-dimer-chronic right femoral vein DVT: Very minimally ambulatory at baseline-at risk for propagation of DVT-continue Eliquis.  HTN: BP stable-antihypertensives on hold-we will resume when able over the next few days.  Hypothyroidism: TSH 227!  She acknowledges not taking Synthroid for the past several months-have slowly increased levothyroxine back to 200 mcg.  Repeat TSH, T3 and free T4 and 2 to 3 weeks.  Hypokalemia: Repleted.  GERD: Continue PPI  Depression with anxiety: Stable-continue Cymbalta/mirtazapine  Gout: Continue Febuxostat  Debility/deconditioning/frequent falls: Chronic weakness +  acute decline now , mostly wheelchair-bound and bathroom with help and walker for several years according to the husband, now acute declining with COVID/UTI-PT with recommendations for SNF.  Likely severe deconditioning due to severe hypothyroidism, social worker following-apparently will be able to go to SNF.    Discharge diagnosis     Active Problems:   UTI (urinary tract infection)   COVID-19 virus infection    Discharge instructions    Discharge Instructions    Diet - low sodium heart healthy   Complete by: As directed    Discharge instructions   Complete  by: As directed    Follow with Primary MD Burnard Bunting, MD in 7 days   Get CBC, CMP, TSH, 2 view Chest X ray -  checked next visit within 1 week by Primary MD    Activity: As tolerated with Full fall precautions use walker/cane & assistance as needed  Disposition SNF  Diet: Heart Healthy     Special Instructions: If you have smoked or chewed Tobacco  in the last 2 yrs please stop smoking, stop any regular Alcohol  and or any Recreational drug use.  On your next visit with your primary care physician please Get Medicines reviewed and adjusted.  Please request your Prim.MD to go over all Hospital Tests and Procedure/Radiological results at the follow up, please get all Hospital records sent to your Prim MD by signing hospital release before you go home.  If you experience worsening of your admission symptoms, develop shortness of breath, life threatening emergency, suicidal or homicidal thoughts you must seek medical attention immediately by calling 911 or calling your MD immediately  if symptoms less severe.  You Must read complete instructions/literature along with all the possible adverse reactions/side effects for all the Medicines you take and that have been prescribed to you. Take any new Medicines after you have completely understood and accpet all the possible adverse reactions/side effects.   Increase activity slowly   Complete by: As directed    No wound care   Complete by: As directed       Discharge Medications   Allergies as of 11/15/2020      Reactions   Other Swelling   A bandage/gauze that was used on lower extremity wound at [facility] health, unsure type- Pt states her foot turned red and swelled up   Penicillins Swelling, Rash   Has patient had a PCN reaction causing immediate rash, facial/tongue/throat swelling, SOB or lightheadedness with hypotension: Unknown Has patient had a PCN reaction causing severe rash involving mucus membranes or skin necrosis:  Unknown Has patient had a PCN reaction that required hospitalization: No Has patient had a PCN reaction occurring within the last 10 years: No If all of the above answers are "NO", then may proceed with Cephalosporin use.   Sulfa Antibiotics Itching, Swelling   Codeine Nausea Only   Hydrocodone-acetaminophen Other (See Comments)   Causes hallucinations   Naproxen Sodium Swelling, Other (See Comments)   (  ALEVE) Red Man Syndrome   Tape Rash, Other (See Comments)   BURNS SKIN --PAPER TAPE IS OKAY      Medication List    STOP taking these medications   aspirin 81 MG EC tablet     TAKE these medications   amLODipine 5 MG tablet Commonly known as: NORVASC Take 5 mg by mouth daily.   apixaban 5 MG Tabs tablet Commonly known as: ELIQUIS Take 1 tablet (5 mg total) by mouth 2 (two) times daily.   baclofen 10 MG tablet Commonly known as: LIORESAL Take 10 mg by mouth 2 (two) times daily as needed for muscle spasms.   cyanocobalamin 1000 MCG/ML injection Commonly known as: (VITAMIN B-12) Inject 1 mL into the skin every 28 (twenty-eight) days.   DULoxetine 60 MG capsule Commonly known as: CYMBALTA Take 60 mg by mouth at bedtime.   estrogen (conjugated)-medroxyprogesterone 0.625-2.5 MG tablet Commonly known as: PREMPRO Take 1 tablet by mouth daily.   Febuxostat 80 MG Tabs Take 40 mg by mouth at bedtime.   fluticasone 50 MCG/ACT nasal spray Commonly known as: FLONASE Place 1 spray into both nostrils daily.   furosemide 40 MG tablet Commonly known as: LASIX Take 40 mg by mouth daily as needed for edema.   gabapentin 100 MG capsule Commonly known as: NEURONTIN Take 100 mg by mouth daily.   HYDROcodone-acetaminophen 5-325 MG tablet Commonly known as: NORCO/VICODIN Take 1 tablet by mouth every 12 (twelve) hours as needed for severe pain. What changed:   when to take this  reasons to take this   mirtazapine 7.5 MG tablet Commonly known as: REMERON Take 7.5 mg by  mouth at bedtime.   nystatin cream Commonly known as: MYCOSTATIN Apply to affected area 2 times daily   pantoprazole 40 MG tablet Commonly known as: PROTONIX Take 40 mg by mouth daily before breakfast.   predniSONE 5 MG tablet Commonly known as: DELTASONE Take 10 mg by mouth daily.   Synthroid 175 MCG tablet Generic drug: levothyroxine Take 175 mcg by mouth daily before breakfast. Take 139mcg by mouth on Tuesdays, Thursdays, Saturdays and Sundays   levothyroxine 200 MCG tablet Commonly known as: SYNTHROID Take 200 mcg by mouth daily. Take 267mcg by mouth        Follow-up Information    Burnard Bunting, MD. Schedule an appointment as soon as possible for a visit in 1 week(s).   Specialty: Internal Medicine Contact information: [address] [place] [address] [phone]               Major procedures and Radiology Reports - PLEASE review detailed and final reports thoroughly  -       DG Chest [facility] 1 View  Result Date: 11/05/2020 CLINICAL DATA:  General weakness EXAM: PORTABLE CHEST 1 VIEW COMPARISON:  None. FINDINGS: Mildly diminished lung volumes. No consolidation or effusion. Normal cardiomediastinal silhouette. No pneumothorax. Opacity over the right lower lung felt secondary to healing right seventh posterior rib fracture. IMPRESSION: No active disease. Probable healing right seventh posterior rib fracture. Electronically Signed   By: Donavan Foil M.D.   On: 11/05/2020 17:58   VAS [place] LOWER EXTREMITY VENOUS (DVT)  Result Date: 11/12/2020  Lower Venous DVT Study Other Indications: D-Dimer. Risk Factors: None identified. Anticoagulation: Lovenox. Comparison Study: Prev 03/2019 Negative Performing Technologist: Vonzell Schlatter RVT  Examination Guidelines: A complete evaluation includes B-mode imaging, spectral Doppler, color Doppler, and power Doppler as needed of all accessible portions of each vessel. Bilateral testing is  considered an integral part of a  complete examination. Limited examinations for reoccurring indications may be performed as noted. The reflux portion of the exam is performed with the patient in reverse Trendelenburg.  +---------+---------------+---------+-----------+----------+--------------+ RIGHT    CompressibilityPhasicitySpontaneityPropertiesThrombus Aging +---------+---------------+---------+-----------+----------+--------------+ CFV      Full                                                        +---------+---------------+---------+-----------+----------+--------------+ SFJ      Full                                                        +---------+---------------+---------+-----------+----------+--------------+ FV Prox  Partial                                      Chronic        +---------+---------------+---------+-----------+----------+--------------+ FV Mid   Partial                                      Chronic        +---------+---------------+---------+-----------+----------+--------------+ FV DistalPartial                                      Chronic        +---------+---------------+---------+-----------+----------+--------------+ PFV      Full                                                        +---------+---------------+---------+-----------+----------+--------------+ POP      Full           Yes      Yes                                 +---------+---------------+---------+-----------+----------+--------------+ PTV      Full                                                        +---------+---------------+---------+-----------+----------+--------------+ PERO     Full                                                        +---------+---------------+---------+-----------+----------+--------------+   +---------+---------------+---------+-----------+----------+--------------+ LEFT     CompressibilityPhasicitySpontaneityPropertiesThrombus Aging  +---------+---------------+---------+-----------+----------+--------------+ CFV      Full           Yes      Yes                                 +---------+---------------+---------+-----------+----------+--------------+  SFJ      Full                                                        +---------+---------------+---------+-----------+----------+--------------+ FV Prox  Full                                                        +---------+---------------+---------+-----------+----------+--------------+ FV Mid   Full                                                        +---------+---------------+---------+-----------+----------+--------------+ FV DistalFull                                                        +---------+---------------+---------+-----------+----------+--------------+ PFV      Full                                                        +---------+---------------+---------+-----------+----------+--------------+ POP      Full           Yes      Yes                                 +---------+---------------+---------+-----------+----------+--------------+ PTV      Full                                                        +---------+---------------+---------+-----------+----------+--------------+ PERO     Full                                                        +---------+---------------+---------+-----------+----------+--------------+  Summary: RIGHT: - Findings consistent with 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 Femoral vein appears post phlebitic no acute segments visualized. - No cystic structure found in the popliteal fossa.  LEFT: - There is no evidence of deep vein thrombosis in the lower extremity.  - No cystic structure found in the popliteal fossa.  *See table(s) above for measurements and observations. Electronically signed by Servando Snare MD on 11/12/2020 at 1:57:12 PM.    Final     Micro  Results     Recent Results (from the past 240 hour(s))  SARS CORONAVIRUS 2 (TAT 6-24 HRS) Nasopharyngeal Nasopharyngeal Swab  Status: Abnormal   Collection Time: 11/05/20  5:08 PM   Specimen: Nasopharyngeal Swab  Result Value Ref Range Status   SARS Coronavirus 2 POSITIVE (A) NEGATIVE Final    Comment: (NOTE) SARS-CoV-2 target nucleic acids are DETECTED.  The SARS-CoV-2 RNA is generally detectable in upper and lower respiratory specimens during the acute phase of infection. Positive results are indicative of the presence of SARS-CoV-2 RNA. Clinical correlation with patient history and other diagnostic information is  necessary to determine patient infection status. Positive results do not rule out bacterial infection or co-infection with other viruses.  The expected result is Negative.  Fact Sheet for Patients: SugarRoll.be  Fact Sheet for Healthcare Providers: https://www.woods-mathews.com/  This test is not yet approved or cleared by the [place] FDA and  has been authorized for detection and/or diagnosis of SARS-CoV-2 by FDA under an Emergency Use Authorization (EUA). This EUA will remain  in effect (meaning this test can be used) for the duration of the COVID-19 declaration under Section 564(b)(1) of the Act, 21 U. S.C. section 360bbb-3(b)(1), unless the authorization is terminated or revoked sooner.   Performed at [facility], [place] [address], [place], [place] [address]     Today   Subjective    Carolyn Robertson today has no headache,no chest abdominal pain,no new weakness tingling or numbness, feels much better     Objective   Blood pressure (!) 126/97, pulse 78, temperature 97.8 F (36.6 C), temperature source Axillary, resp. rate 17, height 5\' 4"  (1.626 m), weight 57.6 kg, SpO2 99 %.   Intake/Output Summary (Last 24 hours) at 11/15/2020 1042 Last data filed at 11/15/2020 1005 Gross per 24 hour  Intake  240 ml  Output 800 ml  Net -560 ml    Exam  Awake Alert, No new F.N deficits, Normal affect [place].AT,PERRAL Supple Neck,No JVD, No cervical lymphadenopathy appriciated.  Symmetrical Chest wall movement, Good air movement bilaterally, CTAB RRR,No Gallops,Rubs or new Murmurs, No Parasternal Heave +ve B.Sounds, Abd Soft, Non tender, No organomegaly appriciated, No rebound -guarding or rigidity. No Cyanosis,  chronic almost healed multiple bilateral lower extremity skin wounds noted     Data Review   CBC w Diff:  Lab Results  Component Value Date   WBC 9.7 11/15/2020   HGB 10.8 (L) 11/15/2020   HCT 35.4 (L) 11/15/2020   PLT 408 (H) 11/15/2020   LYMPHOPCT 24 11/15/2020   MONOPCT 9 11/15/2020   EOSPCT 5 11/15/2020   BASOPCT 1 11/15/2020    CMP:  Lab Results  Component Value Date   NA 136 11/15/2020   K 4.2 11/15/2020   CL 108 11/15/2020   CO2 20 (L) 11/15/2020   BUN 22 11/15/2020   CREATININE 2.29 (H) 11/15/2020   PROT 4.9 (L) 11/15/2020   ALBUMIN 2.5 (L) 11/15/2020   BILITOT 0.5 11/15/2020   ALKPHOS 63 11/15/2020   AST 19 11/15/2020   ALT 15 11/15/2020  .  Lab Results  Component Value Date   TSH 227.067 (H) 11/06/2020    Total Time in preparing paper work, data evaluation and todays exam - 72 minutes  Lala Lund M.D on 11/15/2020 at 10:42 AM  [facility]

## 2020-11-15 NOTE — Discharge Instructions (Signed)
Follow with Primary MD Burnard Bunting, MD in 7 days   Get CBC, CMP, 2 view Chest X ray -  checked next visit within 1 week by Primary MD    Activity: As tolerated with Full fall precautions use walker/cane & assistance as needed  Disposition SNF  Diet: Heart Healthy     Special Instructions: If you have smoked or chewed Tobacco  in the last 2 yrs please stop smoking, stop any regular Alcohol  and or any Recreational drug use.  On your next visit with your primary care physician please Get Medicines reviewed and adjusted.  Please request your Prim.MD to go over all Hospital Tests and Procedure/Radiological results at the follow up, please get all Hospital records sent to your Prim MD by signing hospital release before you go home.  If you experience worsening of your admission symptoms, develop shortness of breath, life threatening emergency, suicidal or homicidal thoughts you must seek medical attention immediately by calling 911 or calling your MD immediately  if symptoms less severe.  You Must read complete instructions/literature along with all the possible adverse reactions/side effects for all the Medicines you take and that have been prescribed to you. Take any new Medicines after you have completely understood and accpet all the possible adverse reactions/side effects.

## 2020-11-16 DIAGNOSIS — F419 Anxiety disorder, unspecified: Secondary | ICD-10-CM | POA: Diagnosis not present

## 2020-11-16 DIAGNOSIS — N184 Chronic kidney disease, stage 4 (severe): Secondary | ICD-10-CM | POA: Diagnosis not present

## 2020-11-16 DIAGNOSIS — F32A Depression, unspecified: Secondary | ICD-10-CM | POA: Diagnosis not present

## 2020-11-16 DIAGNOSIS — K219 Gastro-esophageal reflux disease without esophagitis: Secondary | ICD-10-CM | POA: Diagnosis not present

## 2020-11-16 DIAGNOSIS — I639 Cerebral infarction, unspecified: Secondary | ICD-10-CM | POA: Diagnosis not present

## 2020-11-16 DIAGNOSIS — E119 Type 2 diabetes mellitus without complications: Secondary | ICD-10-CM | POA: Diagnosis not present

## 2020-11-16 DIAGNOSIS — E039 Hypothyroidism, unspecified: Secondary | ICD-10-CM | POA: Diagnosis not present

## 2020-11-16 DIAGNOSIS — M1991 Primary osteoarthritis, unspecified site: Secondary | ICD-10-CM | POA: Diagnosis not present

## 2020-11-16 DIAGNOSIS — M6281 Muscle weakness (generalized): Secondary | ICD-10-CM | POA: Diagnosis not present

## 2020-11-16 DIAGNOSIS — I82511 Chronic embolism and thrombosis of right femoral vein: Secondary | ICD-10-CM | POA: Diagnosis not present

## 2020-11-16 DIAGNOSIS — G8929 Other chronic pain: Secondary | ICD-10-CM | POA: Diagnosis not present

## 2020-11-16 DIAGNOSIS — M1A9XX0 Chronic gout, unspecified, without tophus (tophi): Secondary | ICD-10-CM | POA: Diagnosis not present

## 2020-11-20 ENCOUNTER — Other Ambulatory Visit: Payer: Self-pay | Admitting: *Deleted

## 2020-11-20 NOTE — Patient Outreach (Signed)
Member screened for potential [facility] Care Management needs.  Mrs. Lauf is receiving skilled therapy at [facility].   Communication with SNF SW indicates member's transition plan is for long term care.   No identifiable [facility] needs if member remains long term.   Marthenia Rolling, MSN, RN,BSN [place] Acute Care Coordinator [phone] [facility]) [phone]  (Toll free office)

## 2020-11-26 DIAGNOSIS — R627 Adult failure to thrive: Secondary | ICD-10-CM | POA: Diagnosis not present

## 2020-11-26 DIAGNOSIS — G8929 Other chronic pain: Secondary | ICD-10-CM | POA: Diagnosis not present

## 2020-11-26 DIAGNOSIS — F32A Depression, unspecified: Secondary | ICD-10-CM | POA: Diagnosis not present

## 2020-11-26 DIAGNOSIS — N184 Chronic kidney disease, stage 4 (severe): Secondary | ICD-10-CM | POA: Diagnosis not present

## 2020-11-26 DIAGNOSIS — F419 Anxiety disorder, unspecified: Secondary | ICD-10-CM | POA: Diagnosis not present

## 2020-11-26 DIAGNOSIS — R54 Age-related physical debility: Secondary | ICD-10-CM | POA: Diagnosis not present

## 2020-11-26 DIAGNOSIS — I10 Essential (primary) hypertension: Secondary | ICD-10-CM | POA: Diagnosis not present

## 2020-12-03 DIAGNOSIS — F32A Depression, unspecified: Secondary | ICD-10-CM | POA: Diagnosis not present

## 2020-12-03 DIAGNOSIS — M6281 Muscle weakness (generalized): Secondary | ICD-10-CM | POA: Diagnosis not present

## 2020-12-03 DIAGNOSIS — F419 Anxiety disorder, unspecified: Secondary | ICD-10-CM | POA: Diagnosis not present

## 2020-12-03 DIAGNOSIS — R627 Adult failure to thrive: Secondary | ICD-10-CM | POA: Diagnosis not present

## 2020-12-03 DIAGNOSIS — I639 Cerebral infarction, unspecified: Secondary | ICD-10-CM | POA: Diagnosis not present

## 2020-12-03 DIAGNOSIS — I10 Essential (primary) hypertension: Secondary | ICD-10-CM | POA: Diagnosis not present

## 2020-12-03 DIAGNOSIS — R54 Age-related physical debility: Secondary | ICD-10-CM | POA: Diagnosis not present

## 2020-12-03 DIAGNOSIS — K219 Gastro-esophageal reflux disease without esophagitis: Secondary | ICD-10-CM | POA: Diagnosis not present

## 2020-12-04 ENCOUNTER — Other Ambulatory Visit: Payer: Self-pay | Admitting: *Deleted

## 2020-12-04 NOTE — Patient Outreach (Signed)
[facility] Post- Acute Care Coordinator follow up.   Communication sent to [facility] SN[facility] SW to inquire about member's transition plans.   Will continue to follow while member resides in SNF.    Marthenia Rolling, MSN, RN,BSN [place] Acute Care Coordinator [phone] [facility]) [phone]  (Toll free office)

## 2020-12-05 DIAGNOSIS — R54 Age-related physical debility: Secondary | ICD-10-CM | POA: Diagnosis not present

## 2020-12-05 DIAGNOSIS — K219 Gastro-esophageal reflux disease without esophagitis: Secondary | ICD-10-CM | POA: Diagnosis not present

## 2020-12-05 DIAGNOSIS — F419 Anxiety disorder, unspecified: Secondary | ICD-10-CM | POA: Diagnosis not present

## 2020-12-05 DIAGNOSIS — N184 Chronic kidney disease, stage 4 (severe): Secondary | ICD-10-CM | POA: Diagnosis not present

## 2020-12-05 DIAGNOSIS — I82511 Chronic embolism and thrombosis of right femoral vein: Secondary | ICD-10-CM | POA: Diagnosis not present

## 2020-12-05 DIAGNOSIS — F32A Depression, unspecified: Secondary | ICD-10-CM | POA: Diagnosis not present

## 2020-12-05 DIAGNOSIS — G8929 Other chronic pain: Secondary | ICD-10-CM | POA: Diagnosis not present

## 2020-12-05 DIAGNOSIS — M6281 Muscle weakness (generalized): Secondary | ICD-10-CM | POA: Diagnosis not present

## 2020-12-05 DIAGNOSIS — I129 Hypertensive chronic kidney disease with stage 1 through stage 4 chronic kidney disease, or unspecified chronic kidney disease: Secondary | ICD-10-CM | POA: Diagnosis not present

## 2020-12-14 ENCOUNTER — Other Ambulatory Visit: Payer: Self-pay

## 2020-12-14 ENCOUNTER — Non-Acute Institutional Stay: Payer: Medicare Other | Admitting: Nurse Practitioner

## 2020-12-14 DIAGNOSIS — Z515 Encounter for palliative care: Secondary | ICD-10-CM | POA: Diagnosis not present

## 2020-12-14 DIAGNOSIS — R1013 Epigastric pain: Secondary | ICD-10-CM

## 2020-12-14 NOTE — Progress Notes (Signed)
[facility] Consult Note Telephone: [phone]  Fax: [phone]  PATIENT NAME: Carolyn [address] [address] [phone] (home)  DOB: [birth year] MRN: [id number]  PRIMARY CARE PROVIDER:    Burnard Bunting, MD,  [place] [address] [phone]  REFERRING PROVIDER:   Burnard Bunting, [place],  [place] [address] [phone]  RESPONSIBLE PARTY:   Extended Emergency Contact Information Primary Emergency Contact: Luberta, Grabinski Address: [address]          [place], [place] [address] [name] of [place] Phone: [phone] Mobile Phone: [phone] Relation: Spouse Secondary Emergency Contact: Malu, Pellegrini Mobile Phone: [phone] Relation: Daughter  I met face to face with patient in facility.   ASSESSMENT AND RECOMMENDATIONS:   Advance Care Planning: Goal of care: Pateint's goal of care is function. She verbalized desire to get stronger and return home Directives: Patient has a signed MOST form on file, copy on [facility] EMR. Details of MOST form includes, DNR/no cpr, full scope of treatment, antibiotics if indicated, IV fluids if indicated.  Symptom Management:  Epigastric pain: Patient complained of right upper abdominal pain, mostly in the epigastric area. Report symptoms is chronic, report it comes an goes, but has been constant in the last 2-3 days. Denied constipation, report last BM was yesterday. She has GERD and on Protonix 22m daily, she is noted to have small Hiatal hernia on CT completed on 01/17/2017. Patient endorsed mild nausea, no vomiting, no bloating, no report of fever or chills. Facility staff denied awareness of patient complaints, saying patient has not complained of any pain. Staff report patient was noted with elevated WBC of 15 on 12/08/2020 up from 9.7 on hospital discharge on 11/15/2020, her UA was -ve with no growth  reported on 12/11/2020. Vital signs reviewed unremarkable. Recommendation: Consider obtaining a KUB and repeating CBC to evaluate trend of WBC, to rule out infectious process.  Follow up Palliative Care Visit: Palliative care will continue to follow for complex decision making and symptom management. Return 4-8 weeks or prn.  Family /Caregiver/Community Supports:  Patient lived at home with her husband prior to rehab stay. She is currently in rehab at [facility]  Cognitive / Functional decline:  Patient awake and alert Moderate assist with ADLs, able to feed self, ambulates with a walker.  I spent 40 minutes providing this consultation, time includes time spent with patient, chart review, provider coordination, and documentation. More than 50% of the time in this consultation was spent counseling and coordinating communication.   CHIEF COMPLAINT: upper abdominal pain/epigastric pain  History obtained from review of EMR, discussion with facility staff and interview with patient. Records reviewed and summarized bellow.  HISTORY OF PRESENT ILLNESS:  Carolyn KNAPIKis a 75y.o. female with history of Vasculr dementia (FAST 6d), CVA, stage 4 CKD, hypothyroidism, cystitis, Osteoarthritis, GERD, gout, Hiatal hernia. Patient s/p hospitalization 11/05/2020 to 11/15/2020 for UTI and break through Covid-19 infection. Patient discharged to SNF for rehabilitation. Palliative care was asked help address advance care planning and goals of care.  CODE STATUS: DNR  PPS: 40%  HOSPICE ELIGIBILITY/DIAGNOSIS: TBD  ROS/staff/patient   Constitutional: denied fever, denied chills EYES: denies vision changes ENMT: denies dysphagia Cardiovascular: denies chest pain, denies palpitation Pulmonary: denies cough, denies increased SOB Abdomen: endorses fair appetite, denied constipation, denied incontinence of bowel GU: denies dysuria, endorses incontinence of urine MSK:  endorses ROM  limitations  Skin: denies rashes or wounds Neurological: endorses weakness, denies pain, denies insomnia Psych: Endorses positive mood Heme/lymph/immuno: denies bruises, denies abnormal bleeding   Physical Exam:  General: frail appearing, lying in bed in NAD EYES: anicteric sclera, lids intact, no discharge  ENMT: intact hearing, oral mucous membranes moist CV:  +1 LE edema Pulmonary: no increased work of breathing, no cough, no audible wheezes, room air Abdomen: no ascites, +ve bowel sounds, upper quadrant abdominal tenderness GU: no bladder distension MSK: no contractures of LE Skin: warm and dry, no rashes or wounds on visible skin, scattered ecchymosis in bilateral lower extremities Neuro: Generalized weakness, otherwise non focal Psych: non-anxious affect  Hem/lymph/immuno: no widespread bruising or bleeding   PAST MEDICAL HISTORY:  Past Medical History:  Diagnosis Date  . Anxiety   . Arthritis    RA  . 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 Depression   . GERD (gastroesophageal reflux disease)   . History of blood transfusion    after miscarrige  . History of kidney stones    x 2  . Hypertension   . Hypothyroidism   . Incontinent of urine    incontinent at night  . Kidney failure   . Memory loss    "after TIA"  . Pneumonia    as a child and teenager  . Renal disorder    stage IV kidney disease  . Stroke (HCC)   . Thyroid disease   . TIA (transient ischemic attack) 06/2018    SOCIAL HX:  Social History   Tobacco Use  . Smoking status: Never Smoker  . Smokeless tobacco: Never Used  Substance Use Topics  . Alcohol use: No    Comment: rarely   FAMILY HX:  Family History  Problem Relation Age of Onset  . Atrial fibrillation Mother   . Stroke Father     ALLERGIES:  Allergies  Allergen Reactions  . Other Swelling    A bandage/gauze that was used on lower extremity wound at [facility], unsure type- Pt states her foot turned  red and swelled up  . Penicillins Swelling and Rash    Has patient had a PCN reaction causing immediate rash, facial/tongue/throat swelling, SOB or lightheadedness with hypotension: Unknown Has patient had a PCN reaction causing severe rash involving mucus membranes or skin necrosis: Unknown Has patient had a PCN reaction that required hospitalization: No Has patient had a PCN reaction occurring within the last 10 years: No If all of the above answers are "NO", then may proceed with Cephalosporin use.   . Sulfa Antibiotics Itching and Swelling  . Codeine Nausea Only  . Hydrocodone-Acetaminophen Other (See Comments)    Causes hallucinations  . Naproxen Sodium Swelling and Other (See Comments)    (ALEVE) Red Man Syndrome  . Tape Rash and Other (See Comments)    BURNS SKIN --PAPER TAPE IS OKAY     PERTINENT MEDICATIONS:  Outpatient Encounter Medications as of 12/14/2020  Medication Sig  . amLODipine (NORVASC) 5 MG tablet Take 5 mg by mouth daily.  . apixaban (ELIQUIS) 5 MG TABS tablet Take 1 tablet (5 mg total) by mouth 2 (two) times daily.  . baclofen (LIORESAL) 10 MG tablet Take 10 mg by mouth 2 (two) times daily as needed for muscle spasms.  . cyanocobalamin (,VITAMIN B-12,) 1000 MCG/ML injection Inject 1 mL into the skin every 28 (twenty-eight) days.  . DULoxetine (CYMBALTA) 60 MG capsule Take 60 mg by mouth at bedtime.  .   estrogen, conjugated,-medroxyprogesterone (PREMPRO) 0.625-2.5 MG per tablet Take 1 tablet by mouth daily.  . Febuxostat 80 MG TABS Take 40 mg by mouth at bedtime.   . fluticasone (FLONASE) 50 MCG/ACT nasal spray Place 1 spray into both nostrils daily.  . furosemide (LASIX) 40 MG tablet Take 40 mg by mouth daily as needed for edema.   . gabapentin (NEURONTIN) 100 MG capsule Take 100 mg by mouth daily.  . HYDROcodone-acetaminophen (NORCO/VICODIN) 5-325 MG tablet Take 1 tablet by mouth every 12 (twelve) hours as needed for severe pain.  . levothyroxine (SYNTHROID) 200  MCG tablet Take 200 mcg by mouth daily. Take 200mcg by mouth (Patient not taking: No sig reported)  . mirtazapine (REMERON) 7.5 MG tablet Take 7.5 mg by mouth at bedtime.  . nystatin cream (MYCOSTATIN) Apply to affected area 2 times daily  . pantoprazole (PROTONIX) 40 MG tablet Take 40 mg by mouth daily before breakfast.  . predniSONE (DELTASONE) 5 MG tablet Take 10 mg by mouth daily.  . SYNTHROID 175 MCG tablet Take 175 mcg by mouth daily before breakfast. Take 175mcg by mouth on Tuesdays, Thursdays, Saturdays and Sundays   No facility-administered encounter medications on file as of 12/14/2020.    Thank you for the opportunity to participate in the care of Ms. Carolyn Robertson. The palliative care team will continue to follow. Please call our office at [phone] if we can be of additional assistance.   [place], DNP, AGPCNP-BC   

## 2020-12-15 DIAGNOSIS — I129 Hypertensive chronic kidney disease with stage 1 through stage 4 chronic kidney disease, or unspecified chronic kidney disease: Secondary | ICD-10-CM | POA: Diagnosis not present

## 2020-12-15 DIAGNOSIS — K219 Gastro-esophageal reflux disease without esophagitis: Secondary | ICD-10-CM | POA: Diagnosis not present

## 2020-12-15 DIAGNOSIS — M1991 Primary osteoarthritis, unspecified site: Secondary | ICD-10-CM | POA: Diagnosis not present

## 2020-12-15 DIAGNOSIS — F419 Anxiety disorder, unspecified: Secondary | ICD-10-CM | POA: Diagnosis not present

## 2020-12-15 DIAGNOSIS — G8929 Other chronic pain: Secondary | ICD-10-CM | POA: Diagnosis not present

## 2020-12-15 DIAGNOSIS — M6281 Muscle weakness (generalized): Secondary | ICD-10-CM | POA: Diagnosis not present

## 2020-12-16 DIAGNOSIS — K567 Ileus, unspecified: Secondary | ICD-10-CM | POA: Diagnosis not present

## 2020-12-16 DIAGNOSIS — I82511 Chronic embolism and thrombosis of right femoral vein: Secondary | ICD-10-CM | POA: Diagnosis not present

## 2020-12-16 DIAGNOSIS — M6281 Muscle weakness (generalized): Secondary | ICD-10-CM | POA: Diagnosis not present

## 2020-12-16 DIAGNOSIS — I129 Hypertensive chronic kidney disease with stage 1 through stage 4 chronic kidney disease, or unspecified chronic kidney disease: Secondary | ICD-10-CM | POA: Diagnosis not present

## 2020-12-16 DIAGNOSIS — I10 Essential (primary) hypertension: Secondary | ICD-10-CM | POA: Diagnosis not present

## 2020-12-17 DIAGNOSIS — I10 Essential (primary) hypertension: Secondary | ICD-10-CM | POA: Diagnosis not present

## 2020-12-17 DIAGNOSIS — F32A Depression, unspecified: Secondary | ICD-10-CM | POA: Diagnosis not present

## 2020-12-17 DIAGNOSIS — K567 Ileus, unspecified: Secondary | ICD-10-CM | POA: Diagnosis not present

## 2020-12-17 DIAGNOSIS — I82511 Chronic embolism and thrombosis of right femoral vein: Secondary | ICD-10-CM | POA: Diagnosis not present

## 2020-12-17 DIAGNOSIS — F419 Anxiety disorder, unspecified: Secondary | ICD-10-CM | POA: Diagnosis not present

## 2020-12-18 DIAGNOSIS — F419 Anxiety disorder, unspecified: Secondary | ICD-10-CM | POA: Diagnosis not present

## 2020-12-18 DIAGNOSIS — K219 Gastro-esophageal reflux disease without esophagitis: Secondary | ICD-10-CM | POA: Diagnosis not present

## 2020-12-18 DIAGNOSIS — I10 Essential (primary) hypertension: Secondary | ICD-10-CM | POA: Diagnosis not present

## 2020-12-18 DIAGNOSIS — K567 Ileus, unspecified: Secondary | ICD-10-CM | POA: Diagnosis not present

## 2020-12-18 DIAGNOSIS — I129 Hypertensive chronic kidney disease with stage 1 through stage 4 chronic kidney disease, or unspecified chronic kidney disease: Secondary | ICD-10-CM | POA: Diagnosis not present

## 2020-12-18 DIAGNOSIS — G8929 Other chronic pain: Secondary | ICD-10-CM | POA: Diagnosis not present

## 2020-12-21 ENCOUNTER — Other Ambulatory Visit: Payer: Self-pay | Admitting: *Deleted

## 2020-12-21 NOTE — Patient Outreach (Signed)
[facility] Post- Acute Care Coordinator follow up. Member screened for potential [facility] Care Management needs.  Spoke with [facility] SW who indicates member's transition plan is to stay in SNF longer under private pay.  Previously active with [facility].   No transition date set at this time since the plan is to stay privately. Therefore no current identifiable [facility] needs.    Marthenia Rolling, MSN, RN,BSN [facility] Acute Care Coordinator [phone] [facility]) [phone]  (Toll free office)

## 2020-12-23 DIAGNOSIS — M6281 Muscle weakness (generalized): Secondary | ICD-10-CM | POA: Diagnosis not present

## 2020-12-23 DIAGNOSIS — K567 Ileus, unspecified: Secondary | ICD-10-CM | POA: Diagnosis not present

## 2020-12-23 DIAGNOSIS — G8929 Other chronic pain: Secondary | ICD-10-CM | POA: Diagnosis not present

## 2020-12-23 DIAGNOSIS — R609 Edema, unspecified: Secondary | ICD-10-CM | POA: Diagnosis not present

## 2020-12-23 DIAGNOSIS — I10 Essential (primary) hypertension: Secondary | ICD-10-CM | POA: Diagnosis not present

## 2020-12-23 DIAGNOSIS — L03113 Cellulitis of right upper limb: Secondary | ICD-10-CM | POA: Diagnosis not present

## 2020-12-23 DIAGNOSIS — I129 Hypertensive chronic kidney disease with stage 1 through stage 4 chronic kidney disease, or unspecified chronic kidney disease: Secondary | ICD-10-CM | POA: Diagnosis not present

## 2020-12-25 DIAGNOSIS — I129 Hypertensive chronic kidney disease with stage 1 through stage 4 chronic kidney disease, or unspecified chronic kidney disease: Secondary | ICD-10-CM | POA: Diagnosis not present

## 2020-12-25 DIAGNOSIS — I10 Essential (primary) hypertension: Secondary | ICD-10-CM | POA: Diagnosis not present

## 2020-12-25 DIAGNOSIS — G8929 Other chronic pain: Secondary | ICD-10-CM | POA: Diagnosis not present

## 2020-12-25 DIAGNOSIS — I82511 Chronic embolism and thrombosis of right femoral vein: Secondary | ICD-10-CM | POA: Diagnosis not present

## 2020-12-25 DIAGNOSIS — R609 Edema, unspecified: Secondary | ICD-10-CM | POA: Diagnosis not present

## 2020-12-25 DIAGNOSIS — M6281 Muscle weakness (generalized): Secondary | ICD-10-CM | POA: Diagnosis not present

## 2020-12-26 DIAGNOSIS — K219 Gastro-esophageal reflux disease without esophagitis: Secondary | ICD-10-CM | POA: Diagnosis not present

## 2020-12-26 DIAGNOSIS — N184 Chronic kidney disease, stage 4 (severe): Secondary | ICD-10-CM | POA: Diagnosis not present

## 2020-12-26 DIAGNOSIS — G8929 Other chronic pain: Secondary | ICD-10-CM | POA: Diagnosis not present

## 2020-12-26 DIAGNOSIS — R609 Edema, unspecified: Secondary | ICD-10-CM | POA: Diagnosis not present

## 2020-12-26 DIAGNOSIS — F419 Anxiety disorder, unspecified: Secondary | ICD-10-CM | POA: Diagnosis not present

## 2020-12-26 DIAGNOSIS — F32A Depression, unspecified: Secondary | ICD-10-CM | POA: Diagnosis not present

## 2020-12-26 DIAGNOSIS — I82511 Chronic embolism and thrombosis of right femoral vein: Secondary | ICD-10-CM | POA: Diagnosis not present

## 2020-12-26 DIAGNOSIS — R627 Adult failure to thrive: Secondary | ICD-10-CM | POA: Diagnosis not present

## 2020-12-26 DIAGNOSIS — M6281 Muscle weakness (generalized): Secondary | ICD-10-CM | POA: Diagnosis not present

## 2020-12-28 DIAGNOSIS — R54 Age-related physical debility: Secondary | ICD-10-CM | POA: Diagnosis not present

## 2020-12-28 DIAGNOSIS — F32A Depression, unspecified: Secondary | ICD-10-CM | POA: Diagnosis not present

## 2020-12-28 DIAGNOSIS — R627 Adult failure to thrive: Secondary | ICD-10-CM | POA: Diagnosis not present

## 2020-12-28 DIAGNOSIS — L03113 Cellulitis of right upper limb: Secondary | ICD-10-CM | POA: Diagnosis not present

## 2020-12-28 DIAGNOSIS — R609 Edema, unspecified: Secondary | ICD-10-CM | POA: Diagnosis not present

## 2020-12-28 DIAGNOSIS — I82511 Chronic embolism and thrombosis of right femoral vein: Secondary | ICD-10-CM | POA: Diagnosis not present

## 2020-12-28 DIAGNOSIS — G8929 Other chronic pain: Secondary | ICD-10-CM | POA: Diagnosis not present

## 2020-12-28 DIAGNOSIS — M6281 Muscle weakness (generalized): Secondary | ICD-10-CM | POA: Diagnosis not present

## 2020-12-28 DIAGNOSIS — I129 Hypertensive chronic kidney disease with stage 1 through stage 4 chronic kidney disease, or unspecified chronic kidney disease: Secondary | ICD-10-CM | POA: Diagnosis not present

## 2020-12-31 ENCOUNTER — Non-Acute Institutional Stay: Payer: Medicare Other | Admitting: Nurse Practitioner

## 2020-12-31 ENCOUNTER — Other Ambulatory Visit: Payer: Self-pay

## 2020-12-31 DIAGNOSIS — R531 Weakness: Secondary | ICD-10-CM | POA: Diagnosis not present

## 2020-12-31 DIAGNOSIS — Z515 Encounter for palliative care: Secondary | ICD-10-CM | POA: Diagnosis not present

## 2020-12-31 NOTE — Progress Notes (Incomplete)
[facility] Consult Note Telephone: [phone]  Fax: [phone]  PATIENT NAME: Carolyn [address] [address] [phone] (home)  DOB: 27-Aug-1946 MRN: [id number]  PRIMARY CARE PROVIDER:    Burnard Bunting, MD,  [place] [address] [phone]  REFERRING PROVIDER:   Burnard Bunting, [place],  [place] [address] [phone]  RESPONSIBLE PARTY:   Extended Emergency Contact Information Primary Emergency Contact: Scottie, Stanish Address: [address]          [place],  [address] [name] of [place] Phone: [phone] Mobile Phone: [phone] Relation: Spouse Secondary Emergency Contact: Katira, Dumais Mobile Phone: [phone] Relation: Daughter  I met face to face with patient and husband  In facility.  ASSESSMENT AND RECOMMENDATIONS:   Advance Care Planning:  Goal of care:Goal of care is function. Husband want patient to be able to walk. Directives:  Symptom Management:  Foot drop. Husband concerned that she is not progressing. Husband want to know if she is not getting any better Unable to sit up unassiated Stage 4 kidney diease  only pain is in and that is chronic 9%%, [id number]  Follow up Palliative Care Visit: Palliative care will continue to follow for complex decision making and symptom management. Return *** weeks or prn.  Family /Caregiver/Community Supports:   Cognitive / Functional decline:   I spent *** minutes providing this consultation, time includes time spent with patient/family, chart review, provider coordination, and documentation. More than 50% of the time in this consultation was spent counseling and coordinating communication.   CHIEF COMPLAINT:  History obtained from review of EMR, discussion with primary team, and  interview with family, caregiver  and/or ****. Records reviewed and  summarized bellow.  HISTORY OF PRESENT ILLNESS:  Carolyn Robertson is a 75 y.o. year old female with multiple medical problems including ***. Palliative Care was asked to follow this patient by consultation request of Burnard Bunting, MD to help address advance care planning and goals of care. This is a *** visit.  CODE STATUS:   PPS: ***0%  HOSPICE ELIGIBILITY/DIAGNOSIS: TBD  ROS/staff/patient   General: NAD EYES: denies vision changes, wears glasses at times ENMT: denies dysphagia Cardiovascular: denies chest pain Pulmonary: denies  cough, denies increased SOB Abdomen: endorses fair appetite, endorses occ  constipation, endorses incontinence of bowel GU: denies dysuria, endorses incontinence of urine MSK:  endorses ROM limitations, no falls reported Skin: denies rashes or wounds Neurological: endorses weakness, denies pain, denies insomnia Psych: Endorses positive mood, cobative at times Heme/lymph/immuno: denies bruises, abnormal bleeding   Physical Exam: Current and past weights: *** Constitutional: NAD General: frail appearing, thin EYES: anicteric sclera, lids intact, no discharge  ENMT: intact hearing,oral mucous membranes moist CV:  no LE edema Pulmonary: no increased work of breathing, no cough, no audible wheezes, room air Abdomen: intake ***  no ascites GU: deferred MSK: severe sarcopenia, decreased ROM in all extremities, no contractures of LE, non ambulatory Skin: warm and dry, no rashes or wounds on visible skin Neuro: Generalized weakness, severe cognitive impairment Psych: non-anxious affect today, A and O *** Hem/lymph/immuno: no widespread bruising   PAST MEDICAL HISTORY:  Past Medical History:  Diagnosis Date  . Anxiety   . Arthritis    RA  . 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 Depression   . GERD (gastroesophageal reflux disease)   . History of blood transfusion  after miscarrige  . History of kidney stones    x 2   . Hypertension   . Hypothyroidism   . Incontinent of urine    incontinent at night  . Kidney failure   . Memory loss    "after TIA"  . Pneumonia    as a child and teenager  . Renal disorder    stage IV kidney disease  . Stroke ([facility])   . Thyroid disease   . TIA (transient ischemic attack) 06/2018    SOCIAL HX:  Social History   Tobacco Use  . Smoking status: Never Smoker  . Smokeless tobacco: Never Used  Substance Use Topics  . Alcohol use: No    Comment: rarely   FAMILY HX:  Family History  Problem Relation Age of Onset  . Atrial fibrillation Mother   . Stroke Father     ALLERGIES:  Allergies  Allergen Reactions  . Other Swelling    A bandage/gauze that was used on lower extremity wound at [facility], unsure type- Pt states her foot turned red and swelled up  . Penicillins Swelling and Rash    Has patient had a PCN reaction causing immediate rash, facial/tongue/throat swelling, SOB or lightheadedness with hypotension: Unknown Has patient had a PCN reaction causing severe rash involving mucus membranes or skin necrosis: Unknown Has patient had a PCN reaction that required hospitalization: No Has patient had a PCN reaction occurring within the last 10 years: No If all of the above answers are "NO", then may proceed with Cephalosporin use.   . Sulfa Antibiotics Itching and Swelling  . Codeine Nausea Only  . Hydrocodone-Acetaminophen Other (See Comments)    Causes hallucinations  . Naproxen Sodium Swelling and Other (See Comments)    (ALEVE) Red Man Syndrome  . Tape Rash and Other (See Comments)    BURNS SKIN --PAPER TAPE IS OKAY     PERTINENT MEDICATIONS:  Outpatient Encounter Medications as of 12/31/2020  Medication Sig  . amLODipine (NORVASC) 5 MG tablet Take 5 mg by mouth daily.  Marland Kitchen apixaban (ELIQUIS) 5 MG TABS tablet Take 1 tablet (5 mg total) by mouth 2 (two) times daily.  . baclofen (LIORESAL) 10 MG tablet Take 10 mg by mouth 2 (two) times daily as  needed for muscle spasms.  . cyanocobalamin (,VITAMIN B-12,) 1000 MCG/ML injection Inject 1 mL into the skin every 28 (twenty-eight) days.  . DULoxetine (CYMBALTA) 60 MG capsule Take 60 mg by mouth at bedtime.  Marland Kitchen estrogen, conjugated,-medroxyprogesterone (PREMPRO) 0.625-2.5 MG per tablet Take 1 tablet by mouth daily.  . Febuxostat 80 MG TABS Take 40 mg by mouth at bedtime.   . fluticasone (FLONASE) 50 MCG/ACT nasal spray Place 1 spray into both nostrils daily.  . furosemide (LASIX) 40 MG tablet Take 40 mg by mouth daily as needed for edema.   . gabapentin (NEURONTIN) 100 MG capsule Take 100 mg by mouth daily.  Marland Kitchen HYDROcodone-acetaminophen (NORCO/VICODIN) 5-325 MG tablet Take 1 tablet by mouth every 12 (twelve) hours as needed for severe pain.  Marland Kitchen levothyroxine (SYNTHROID) 200 MCG tablet Take 200 mcg by mouth daily. Take 223mg by mouth (Patient not taking: No sig reported)  . mirtazapine (REMERON) 7.5 MG tablet Take 7.5 mg by mouth at bedtime.  .Marland Kitchennystatin cream (MYCOSTATIN) Apply to affected area 2 times daily  . pantoprazole (PROTONIX) 40 MG tablet Take 40 mg by mouth daily before breakfast.  . predniSONE (DELTASONE) 5 MG tablet Take 10 mg by mouth daily.  .Marland Kitchen  SYNTHROID 175 MCG tablet Take 175 mcg by mouth daily before breakfast. Take 146mg by mouth on Tuesdays, Thursdays, Saturdays and Sundays   No facility-administered encounter medications on file as of 12/31/2020.    Thank you for the opportunity to participate in the care of@. The palliative care team will continue to follow. Please call our office at [phone] we can be of additional assistance.  QAlba Destine NP , DNP, AGPCNP-BC  COVID-19 PATIENT SCREENING TOOL  Person answering questions: ____________***______ _____   1.  Is the patient or any family member in the home showing any signs or symptoms regarding respiratory infection?               Person with Symptom- __________NA_________________  a. Fever                                                                           Yes___ No___          ___________________  b. Shortness of breath                                                    Yes___ No___          ___________________ c. Cough/congestion                                       Yes___  No___         ___________________ d. Body aches/pains                                                         Yes___ No___        ____________________ e. Gastrointestinal symptoms (diarrhea, nausea)           Yes___ No___        ____________________  2. Within the past 14 days, has anyone living in the home had any contact with someone with or under investigation for COVID-19?    Yes___ No_X_   Person __________________

## 2020-12-31 NOTE — Progress Notes (Signed)
[facility] Consult Note Telephone: [phone]  Fax: [phone]  PATIENT NAME: Carolyn [address] [address] [phone] (home)  DOB: [birth year] MRN: [id number]  PRIMARY CARE PROVIDER:    Burnard Bunting, MD,  [place] [address] [phone]  REFERRING PROVIDER:   Burnard Bunting, [place],  [place] [address] [phone]  RESPONSIBLE PARTY:   Extended Emergency Contact Information Primary Emergency Contact: Tanaisha, Pittman Address: [address]          [place], [place] [address] [name] of [place] Phone: [phone] Mobile Phone: [phone] Relation: Spouse Secondary Emergency Contact: Darice, Vicario Mobile Phone: [phone] Relation: Daughter  I met face to face with patient and husband in facility.  ASSESSMENT AND RECOMMENDATIONS:   Advance Care Planning: ACP discussion held with patient and her husband. Patient's goal of care is function. Husband want patient to be able to walk again so she can return home. She has signed MOST form on file, copy on [facility] EMR. Details of MOST form includes, DNR/no cpr, full scope of treatment, antibiotics if indicated, IV fluids if indicated.  Symptom Management:  Weakness: patient not progressing with rehab stay and therapy due to weakness. Patient noted with bilateral foot drop. Husband concerned that patient is not progressing, desires to take patient home. Patient completed 5 days antibiotic regimen of Doxycycline for cellulitis in right lower leg. Patient has stage CKD 4, which patient report has been stable in several years. Patient not currently followed by Nephrologist. Patient with low Hgb without report of acute blood loss, Hgb 8.3 down from 9.2 on 12/17/3020 and 10.8 at hospital discharge on 11/15/2020. Recommendation: consider checking stool for occult blood loss. Consider referral to  Nephrology. Encourage active participation in physical therapy sessions to improve strength and balance.   Follow up Palliative Care Visit: Palliative care will continue to follow for complex decision making and symptom management. Return in about 4weeks or prn.  Family /Caregiver/Community Supports: Patient currently in SNF facility for Rehabilitation. She was living at home with husband before her hospitalization. Husband was her primary caregiver.  Cognitive / Functional decline: Patient awake, alert, and coherent. Staff report she is requires maximum assist with her ADLs, able to feed self. Report patient non-ambulatory, requires assistance transferring from bed to to wheelchair.   I spent 35 minutes providing this consultation, time includes time spent with patient and family, chart review, provider coordination, and documentation. More than 50% of the time in this consultation was spent counseling and coordinating communication.   CHIEF COMPLAINT: Weakness  History obtained from review of EMR, discussion with facility staff, and interview with patient and her husband. Records reviewed and summarized bellow.  HISTORY OF PRESENT ILLNESS:Carolyn A Ellisoris a 75 y.o.femalewithhistory of Vasculr dementia (FAST 6d), CVA, stage 4 CKD, hypothyroidism, cystitis, Osteoarthritis, GERD, gout, Hiatal hernia. Patient s/p hospitalization 11/05/2020 to 11/15/2020 for UTI and break through Covid-19 infection. Patient has been rehabilitation facility since discharge, staff report poor progression due to weakness. She was noted with ileus during stay that was treated with bowel rest. Patient continues to have elevated WBC without notable source, UA was unremarkable,  WBC 14.1 on 12/30/2020 up from 12 on 3/3//2022. Hgb was also noted to have dropped from 9.4 on 12/17/2020 to 8.3 on 12/30/2020. Palliative care was asked to help address advance care planning and goals of care.  CODE STATUS: DNR  PPS: 40%  weak  HOSPICE  ELIGIBILITY/DIAGNOSIS: TBD  ROS Constitutional: denies fever, denies chills, endorsed fatigue EYES: denies acute vision changes ENMT: denies dysphagia Cardiovascular: denies chest pain, denies palpitation Pulmonary: denies  acute cough, denies increased SOB Abdomen: endorses poor appetite, denies constipation, denies incontinence of bowel GU: denies dysuria, endorses incontinence of urine MSK:  endorses ROM limitations, no falls reported Skin: denies rashes or wounds Neurological: endorses weakness, denies uncontrolled pain, denies insomnia Psych: Endorses positive mood Heme/lymph/immuno: denies bruises, abnormal bleeding   Physical Exam: General: frail appearing, cooperative, lying in bed actively participating in discussion, NAD EYES: anicteric sclera, lids intact, no discharge  ENMT: intact hearing, oral mucous membranes moist CV:  +2 BLE edema Pulmonary: no increased work of breathing, no cough, no audible wheezes, oxygen sats 95% on room air Abdomen:  no ascites GU: deferred MSK: bilateral foot drop noted, no contractures of LE Skin: warm and dry, no rashes or wounds on visible skin, ecchymosis noted to bilateral lower legs Neuro: Generalized weakness, A and O x 3 Psych: non-anxious affect today Hem/lymph/immuno: no widespread bruising   PAST MEDICAL HISTORY:  Past Medical History:  Diagnosis Date  . Anxiety   . Arthritis    RA  . 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 Depression   . GERD (gastroesophageal reflux disease)   . History of blood transfusion    after miscarrige  . History of kidney stones    x 2  . Hypertension   . Hypothyroidism   . Incontinent of urine    incontinent at night  . Kidney failure   . Memory loss    "after TIA"  . Pneumonia    as a child and teenager  . Renal disorder    stage IV kidney disease  . Stroke ([facility])   . Thyroid disease   . TIA (transient ischemic attack) 06/2018    SOCIAL HX:   Social History   Tobacco Use  . Smoking status: Never Smoker  . Smokeless tobacco: Never Used  Substance Use Topics  . Alcohol use: No    Comment: rarely   FAMILY HX:  Family History  Problem Relation Age of Onset  . Atrial fibrillation Mother   . Stroke Father     ALLERGIES:  Allergies  Allergen Reactions  . Other Swelling    A bandage/gauze that was used on lower extremity wound at [facility] health, unsure type- Pt states her foot turned red and swelled up  . Penicillins Swelling and Rash    Has patient had a PCN reaction causing immediate rash, facial/tongue/throat swelling, SOB or lightheadedness with hypotension: Unknown Has patient had a PCN reaction causing severe rash involving mucus membranes or skin necrosis: Unknown Has patient had a PCN reaction that required hospitalization: No Has patient had a PCN reaction occurring within the last 10 years: No If all of the above answers are "NO", then may proceed with Cephalosporin use.   . Sulfa Antibiotics Itching and Swelling  . Codeine Nausea Only  . Hydrocodone-Acetaminophen Other (See Comments)    Causes hallucinations  . Naproxen Sodium Swelling and Other (See Comments)    (ALEVE) Red Man Syndrome  . Tape Rash and Other (See Comments)    BURNS SKIN --PAPER TAPE IS OKAY     PERTINENT MEDICATIONS:  Outpatient Encounter Medications as of 12/31/2020  Medication Sig  . amLODipine (NORVASC) 5 MG tablet Take 5 mg by mouth daily.  Marland Kitchen apixaban (ELIQUIS) 5 MG TABS tablet Take 1 tablet (5  mg total) by mouth 2 (two) times daily.  . baclofen (LIORESAL) 10 MG tablet Take 10 mg by mouth 2 (two) times daily as needed for muscle spasms.  . cyanocobalamin (,VITAMIN B-12,) 1000 MCG/ML injection Inject 1 mL into the skin every 28 (twenty-eight) days.  . DULoxetine (CYMBALTA) 60 MG capsule Take 60 mg by mouth at bedtime.  Marland Kitchen estrogen, conjugated,-medroxyprogesterone (PREMPRO) 0.625-2.5 MG per tablet Take 1 tablet by mouth daily.  .  Febuxostat 80 MG TABS Take 40 mg by mouth at bedtime.   . fluticasone (FLONASE) 50 MCG/ACT nasal spray Place 1 spray into both nostrils daily.  . furosemide (LASIX) 40 MG tablet Take 40 mg by mouth daily as needed for edema.   . gabapentin (NEURONTIN) 100 MG capsule Take 100 mg by mouth daily.  Marland Kitchen HYDROcodone-acetaminophen (NORCO/VICODIN) 5-325 MG tablet Take 1 tablet by mouth every 12 (twelve) hours as needed for severe pain.  Marland Kitchen levothyroxine (SYNTHROID) 200 MCG tablet Take 200 mcg by mouth daily. Take 278mg by mouth (Patient not taking: No sig reported)  . mirtazapine (REMERON) 7.5 MG tablet Take 7.5 mg by mouth at bedtime.  .Marland Kitchennystatin cream (MYCOSTATIN) Apply to affected area 2 times daily  . pantoprazole (PROTONIX) 40 MG tablet Take 40 mg by mouth daily before breakfast.  . predniSONE (DELTASONE) 5 MG tablet Take 10 mg by mouth daily.  .Marland KitchenSYNTHROID 175 MCG tablet Take 175 mcg by mouth daily before breakfast. Take 1758m by mouth on Tuesdays, Thursdays, Saturdays and Sundays   No facility-administered encounter medications on file as of 12/31/2020.    Thank you for the opportunity to participate in the care of Ms. JaDeloyce WalthersThe palliative care team will continue to follow. Please call our office at [phone] we can be of additional assistance.  QuJari FavreDNP, AGPCNP-BC

## 2021-01-01 ENCOUNTER — Encounter (HOSPITAL_COMMUNITY): Payer: Self-pay | Admitting: Emergency Medicine

## 2021-01-01 ENCOUNTER — Other Ambulatory Visit: Payer: Self-pay

## 2021-01-01 ENCOUNTER — Inpatient Hospital Stay (HOSPITAL_COMMUNITY)
Admission: EM | Admit: 2021-01-01 | Discharge: 2021-01-05 | DRG: 603 | Disposition: A | Discharge status: Skilled Nursing Facility | Payer: Medicare Other | Source: Skilled Nursing Facility | Attending: Internal Medicine | Admitting: Internal Medicine

## 2021-01-01 ENCOUNTER — Emergency Department (HOSPITAL_COMMUNITY): Payer: Medicare Other

## 2021-01-01 DIAGNOSIS — N184 Chronic kidney disease, stage 4 (severe): Secondary | ICD-10-CM | POA: Diagnosis not present

## 2021-01-01 DIAGNOSIS — R8290 Unspecified abnormal findings in urine: Secondary | ICD-10-CM | POA: Diagnosis present

## 2021-01-01 DIAGNOSIS — M069 Rheumatoid arthritis, unspecified: Secondary | ICD-10-CM | POA: Diagnosis present

## 2021-01-01 DIAGNOSIS — E039 Hypothyroidism, unspecified: Secondary | ICD-10-CM | POA: Diagnosis not present

## 2021-01-01 DIAGNOSIS — Z882 Allergy status to sulfonamides status: Secondary | ICD-10-CM

## 2021-01-01 DIAGNOSIS — F32A Depression, unspecified: Secondary | ICD-10-CM | POA: Diagnosis not present

## 2021-01-01 DIAGNOSIS — I10 Essential (primary) hypertension: Secondary | ICD-10-CM | POA: Diagnosis not present

## 2021-01-01 DIAGNOSIS — R0602 Shortness of breath: Secondary | ICD-10-CM | POA: Diagnosis not present

## 2021-01-01 DIAGNOSIS — R935 Abnormal findings on diagnostic imaging of other abdominal regions, including retroperitoneum: Secondary | ICD-10-CM | POA: Diagnosis not present

## 2021-01-01 DIAGNOSIS — Z9049 Acquired absence of other specified parts of digestive tract: Secondary | ICD-10-CM

## 2021-01-01 DIAGNOSIS — I82511 Chronic embolism and thrombosis of right femoral vein: Secondary | ICD-10-CM | POA: Diagnosis not present

## 2021-01-01 DIAGNOSIS — R1312 Dysphagia, oropharyngeal phase: Secondary | ICD-10-CM | POA: Diagnosis not present

## 2021-01-01 DIAGNOSIS — F419 Anxiety disorder, unspecified: Secondary | ICD-10-CM | POA: Diagnosis present

## 2021-01-01 DIAGNOSIS — I130 Hypertensive heart and chronic kidney disease with heart failure and stage 1 through stage 4 chronic kidney disease, or unspecified chronic kidney disease: Secondary | ICD-10-CM | POA: Diagnosis present

## 2021-01-01 DIAGNOSIS — R0689 Other abnormalities of breathing: Secondary | ICD-10-CM | POA: Diagnosis not present

## 2021-01-01 DIAGNOSIS — M7989 Other specified soft tissue disorders: Secondary | ICD-10-CM | POA: Diagnosis not present

## 2021-01-01 DIAGNOSIS — Z20822 Contact with and (suspected) exposure to covid-19: Secondary | ICD-10-CM | POA: Diagnosis not present

## 2021-01-01 DIAGNOSIS — Z8673 Personal history of transient ischemic attack (TIA), and cerebral infarction without residual deficits: Secondary | ICD-10-CM | POA: Diagnosis not present

## 2021-01-01 DIAGNOSIS — J45909 Unspecified asthma, uncomplicated: Secondary | ICD-10-CM | POA: Diagnosis present

## 2021-01-01 DIAGNOSIS — I5031 Acute diastolic (congestive) heart failure: Secondary | ICD-10-CM | POA: Diagnosis not present

## 2021-01-01 DIAGNOSIS — Z823 Family history of stroke: Secondary | ICD-10-CM

## 2021-01-01 DIAGNOSIS — R140 Abdominal distension (gaseous): Secondary | ICD-10-CM | POA: Diagnosis not present

## 2021-01-01 DIAGNOSIS — Z7401 Bed confinement status: Secondary | ICD-10-CM

## 2021-01-01 DIAGNOSIS — I82509 Chronic embolism and thrombosis of unspecified deep veins of unspecified lower extremity: Secondary | ICD-10-CM

## 2021-01-01 DIAGNOSIS — Z79890 Hormone replacement therapy (postmenopausal): Secondary | ICD-10-CM

## 2021-01-01 DIAGNOSIS — R609 Edema, unspecified: Secondary | ICD-10-CM | POA: Diagnosis not present

## 2021-01-01 DIAGNOSIS — D631 Anemia in chronic kidney disease: Secondary | ICD-10-CM | POA: Diagnosis present

## 2021-01-01 DIAGNOSIS — Z91048 Other nonmedicinal substance allergy status: Secondary | ICD-10-CM

## 2021-01-01 DIAGNOSIS — R41841 Cognitive communication deficit: Secondary | ICD-10-CM | POA: Diagnosis not present

## 2021-01-01 DIAGNOSIS — N3944 Nocturnal enuresis: Secondary | ICD-10-CM | POA: Diagnosis present

## 2021-01-01 DIAGNOSIS — Z8616 Personal history of COVID-19: Secondary | ICD-10-CM

## 2021-01-01 DIAGNOSIS — R2689 Other abnormalities of gait and mobility: Secondary | ICD-10-CM | POA: Diagnosis not present

## 2021-01-01 DIAGNOSIS — M6259 Muscle wasting and atrophy, not elsewhere classified, multiple sites: Secondary | ICD-10-CM | POA: Diagnosis not present

## 2021-01-01 DIAGNOSIS — K219 Gastro-esophageal reflux disease without esophagitis: Secondary | ICD-10-CM | POA: Diagnosis not present

## 2021-01-01 DIAGNOSIS — R5381 Other malaise: Secondary | ICD-10-CM | POA: Diagnosis not present

## 2021-01-01 DIAGNOSIS — I5032 Chronic diastolic (congestive) heart failure: Secondary | ICD-10-CM | POA: Diagnosis not present

## 2021-01-01 DIAGNOSIS — I700 Atherosclerosis of aorta: Secondary | ICD-10-CM | POA: Diagnosis not present

## 2021-01-01 DIAGNOSIS — M2550 Pain in unspecified joint: Secondary | ICD-10-CM | POA: Diagnosis not present

## 2021-01-01 DIAGNOSIS — M109 Gout, unspecified: Secondary | ICD-10-CM | POA: Diagnosis not present

## 2021-01-01 DIAGNOSIS — R413 Other amnesia: Secondary | ICD-10-CM | POA: Diagnosis present

## 2021-01-01 DIAGNOSIS — D509 Iron deficiency anemia, unspecified: Secondary | ICD-10-CM | POA: Diagnosis present

## 2021-01-01 DIAGNOSIS — I639 Cerebral infarction, unspecified: Secondary | ICD-10-CM | POA: Diagnosis not present

## 2021-01-01 DIAGNOSIS — R7889 Finding of other specified substances, not normally found in blood: Secondary | ICD-10-CM | POA: Diagnosis not present

## 2021-01-01 DIAGNOSIS — Z888 Allergy status to other drugs, medicaments and biological substances status: Secondary | ICD-10-CM

## 2021-01-01 DIAGNOSIS — G9009 Other idiopathic peripheral autonomic neuropathy: Secondary | ICD-10-CM | POA: Diagnosis not present

## 2021-01-01 DIAGNOSIS — L03113 Cellulitis of right upper limb: Secondary | ICD-10-CM | POA: Diagnosis not present

## 2021-01-01 DIAGNOSIS — M6281 Muscle weakness (generalized): Secondary | ICD-10-CM | POA: Diagnosis not present

## 2021-01-01 DIAGNOSIS — A419 Sepsis, unspecified organism: Secondary | ICD-10-CM | POA: Diagnosis not present

## 2021-01-01 DIAGNOSIS — U071 COVID-19: Secondary | ICD-10-CM | POA: Diagnosis not present

## 2021-01-01 DIAGNOSIS — M21921 Unspecified acquired deformity of right upper arm: Secondary | ICD-10-CM | POA: Diagnosis not present

## 2021-01-01 DIAGNOSIS — E872 Acidosis: Secondary | ICD-10-CM | POA: Diagnosis present

## 2021-01-01 DIAGNOSIS — Z87442 Personal history of urinary calculi: Secondary | ICD-10-CM

## 2021-01-01 DIAGNOSIS — Z880 Allergy status to penicillin: Secondary | ICD-10-CM

## 2021-01-01 DIAGNOSIS — L03119 Cellulitis of unspecified part of limb: Secondary | ICD-10-CM

## 2021-01-01 DIAGNOSIS — I82411 Acute embolism and thrombosis of right femoral vein: Secondary | ICD-10-CM | POA: Diagnosis not present

## 2021-01-01 DIAGNOSIS — L0390 Cellulitis, unspecified: Secondary | ICD-10-CM | POA: Diagnosis not present

## 2021-01-01 DIAGNOSIS — N959 Unspecified menopausal and perimenopausal disorder: Secondary | ICD-10-CM | POA: Diagnosis not present

## 2021-01-01 DIAGNOSIS — J90 Pleural effusion, not elsewhere classified: Secondary | ICD-10-CM | POA: Diagnosis not present

## 2021-01-01 DIAGNOSIS — Z7901 Long term (current) use of anticoagulants: Secondary | ICD-10-CM

## 2021-01-01 DIAGNOSIS — R531 Weakness: Secondary | ICD-10-CM

## 2021-01-01 DIAGNOSIS — G8929 Other chronic pain: Secondary | ICD-10-CM | POA: Diagnosis not present

## 2021-01-01 DIAGNOSIS — Z79899 Other long term (current) drug therapy: Secondary | ICD-10-CM

## 2021-01-01 DIAGNOSIS — Z7952 Long term (current) use of systemic steroids: Secondary | ICD-10-CM

## 2021-01-01 DIAGNOSIS — F418 Other specified anxiety disorders: Secondary | ICD-10-CM | POA: Diagnosis not present

## 2021-01-01 DIAGNOSIS — Z885 Allergy status to narcotic agent status: Secondary | ICD-10-CM

## 2021-01-01 LAB — CBC WITH DIFFERENTIAL/PLATELET
Abs Immature Granulocytes: 0.36 10*3/uL — ABNORMAL HIGH (ref 0.00–0.07)
Basophils Absolute: 0 10*3/uL (ref 0.0–0.1)
Basophils Relative: 0 %
Eosinophils Absolute: 0 10*3/uL (ref 0.0–0.5)
Eosinophils Relative: 0 %
HCT: 28.3 % — ABNORMAL LOW (ref 36.0–46.0)
Hemoglobin: 8.7 g/dL — ABNORMAL LOW (ref 12.0–15.0)
Immature Granulocytes: 2 %
Lymphocytes Relative: 3 %
Lymphs Abs: 0.4 10*3/uL — ABNORMAL LOW (ref 0.7–4.0)
MCH: 26.8 pg (ref 26.0–34.0)
MCHC: 30.7 g/dL (ref 30.0–36.0)
MCV: 87.1 fL (ref 80.0–100.0)
Monocytes Absolute: 0.3 10*3/uL (ref 0.1–1.0)
Monocytes Relative: 2 %
Neutro Abs: 14 10*3/uL — ABNORMAL HIGH (ref 1.7–7.7)
Neutrophils Relative %: 93 %
Platelets: 366 10*3/uL (ref 150–400)
RBC: 3.25 MIL/uL — ABNORMAL LOW (ref 3.87–5.11)
RDW: 16.9 % — ABNORMAL HIGH (ref 11.5–15.5)
WBC: 15.1 10*3/uL — ABNORMAL HIGH (ref 4.0–10.5)
nRBC: 0 % (ref 0.0–0.2)

## 2021-01-01 LAB — COMPREHENSIVE METABOLIC PANEL
ALT: 31 U/L (ref 0–44)
AST: 32 U/L (ref 15–41)
Albumin: 3.1 g/dL — ABNORMAL LOW (ref 3.5–5.0)
Alkaline Phosphatase: 129 U/L — ABNORMAL HIGH (ref 38–126)
Anion gap: 13 (ref 5–15)
BUN: 57 mg/dL — ABNORMAL HIGH (ref 8–23)
CO2: 19 mmol/L — ABNORMAL LOW (ref 22–32)
Calcium: 8.5 mg/dL — ABNORMAL LOW (ref 8.9–10.3)
Chloride: 108 mmol/L (ref 98–111)
Creatinine, Ser: 2.11 mg/dL — ABNORMAL HIGH (ref 0.44–1.00)
GFR, Estimated: 24 mL/min — ABNORMAL LOW (ref >60)
Glucose, Bld: 119 mg/dL — ABNORMAL HIGH (ref 70–99)
Potassium: 5 mmol/L (ref 3.5–5.1)
Sodium: 140 mmol/L (ref 135–145)
Total Bilirubin: 0.3 mg/dL (ref 0.3–1.2)
Total Protein: 6.2 g/dL — ABNORMAL LOW (ref 6.5–8.1)

## 2021-01-01 LAB — URINALYSIS, ROUTINE W REFLEX MICROSCOPIC
Bilirubin Urine: NEGATIVE
Glucose, UA: NEGATIVE mg/dL
Ketones, ur: NEGATIVE mg/dL
Nitrite: NEGATIVE
Protein, ur: 100 mg/dL — AB
Specific Gravity, Urine: 1.012 (ref 1.005–1.030)
WBC, UA: >50 WBC/hpf — ABNORMAL HIGH (ref 0–5)
pH: 5 (ref 5.0–8.0)

## 2021-01-01 LAB — MAGNESIUM: Magnesium: 2.8 mg/dL — ABNORMAL HIGH (ref 1.7–2.4)

## 2021-01-01 LAB — TSH: TSH: 1.622 u[IU]/mL (ref 0.350–4.500)

## 2021-01-01 LAB — PROTIME-INR
INR: 2.3 — ABNORMAL HIGH (ref 0.8–1.2)
Prothrombin Time: 24.9 seconds — ABNORMAL HIGH (ref 11.4–15.2)

## 2021-01-01 LAB — APTT: aPTT: 43 seconds — ABNORMAL HIGH (ref 24–36)

## 2021-01-01 LAB — LACTIC ACID, PLASMA: Lactic Acid, Venous: 1.1 mmol/L (ref 0.5–1.9)

## 2021-01-01 LAB — PHOSPHORUS: Phosphorus: 4.9 mg/dL — ABNORMAL HIGH (ref 2.5–4.6)

## 2021-01-01 MED ORDER — FUROSEMIDE 10 MG/ML IJ SOLN
40.0000 mg | Freq: Every day | INTRAMUSCULAR | Status: DC
  Administered 2021-01-02: 40 mg via INTRAVENOUS
  Filled 2021-01-01: qty 4

## 2021-01-01 MED ORDER — SODIUM CHLORIDE 0.9 % IV SOLN
1.0000 g | INTRAVENOUS | Status: DC
  Administered 2021-01-02 – 2021-01-03 (×2): 1 g via INTRAVENOUS
  Filled 2021-01-01 (×2): qty 1

## 2021-01-01 MED ORDER — ONDANSETRON HCL 4 MG PO TABS: 4.0000 mg | ORAL_TABLET | Freq: Four times a day (QID) | ORAL | Status: DC | PRN

## 2021-01-01 MED ORDER — VANCOMYCIN HCL IN DEXTROSE 1-5 GM/200ML-% IV SOLN
1000.0000 mg | INTRAVENOUS | Status: DC
  Filled 2021-01-01: qty 200

## 2021-01-01 MED ORDER — ACETAMINOPHEN 650 MG RE SUPP: 650.0000 mg | Freq: Four times a day (QID) | RECTAL | Status: DC | PRN

## 2021-01-01 MED ORDER — SODIUM CHLORIDE 0.9 % IV SOLN
1.0000 g | Freq: Once | INTRAVENOUS | Status: AC
  Administered 2021-01-01: 1 g via INTRAVENOUS
  Filled 2021-01-01: qty 10

## 2021-01-01 MED ORDER — VANCOMYCIN HCL IN DEXTROSE 1-5 GM/200ML-% IV SOLN
1000.0000 mg | Freq: Once | INTRAVENOUS | Status: AC
  Administered 2021-01-01: 1000 mg via INTRAVENOUS
  Filled 2021-01-01: qty 200

## 2021-01-01 MED ORDER — ONDANSETRON HCL 4 MG/2ML IJ SOLN: 4.0000 mg | Freq: Four times a day (QID) | INTRAMUSCULAR | Status: DC | PRN

## 2021-01-01 MED ORDER — POLYETHYLENE GLYCOL 3350 17 G PO PACK: 17.0000 g | PACK | Freq: Every day | ORAL | Status: DC | PRN

## 2021-01-01 MED ORDER — ACETAMINOPHEN 325 MG PO TABS
650.0000 mg | ORAL_TABLET | Freq: Four times a day (QID) | ORAL | Status: DC | PRN
  Administered 2021-01-01: 650 mg via ORAL
  Filled 2021-01-01: qty 2

## 2021-01-01 NOTE — H&P (Signed)
History and Physical  Patient Name: Carolyn Robertson     JQZ:[id number]    DOB: 11/12/1945    DOA: 01/01/2021 PCP: Burnard Bunting, MD  Patient coming from: Nursing facility  Chief Complaint: Diffuse edema, right arm cellulitis    HPI: Carolyn Robertson is a 75 y.o. female with hx of CKD 4, CVA, asthma, hypothyroidism, hypertension who presented from nursing facility on 01/01/2021 secondary to lab work showing anemia but was found to have right arm cellulitis.  Patient is a relatively poor historian, thus difficult to obtain thorough HPI.  Patient is currently residing at a skilled nursing facility when she had routine lab work that showed a hemoglobin of 7.1 and she was advised to be evaluated in the ER.  However it was also noted she had worsening diffuse edema, of the upper and lower extremities.  She said it was worse over the past day or so but then tells me currently it is better.  She said the erythema on her right upper extremity progressively the past day or so.  Apparently she was recently started on doxycycline, but unsure how long.  She cannot tell me what other medication she is on.  She denies any other symptoms besides edema and associated discomfort in her arm.  Seems like patient is primarily bedbound.  She denies any dyspnea or chest pain.  ED course: -Vitals initial presentation: Afebrile, heart rate 96, respiratory rate 27, blood pressure 136/70, maintaining sats on room air -Labs on initial presentation: Sodium 140, potassium 5, chloride 108, bicarb 19, glucose 119, BUN 57, creatinine 2.1, WBC 15.1, hemoglobin 8.7, UA: Leukocytes large, bacteria many, WBC >50 -Imaging: Chest x-ray grossly unremarkable.  CT abdomen pelvis showed trace bilateral pleural effusions, vague groundglass opacities in the lower lungs, otherwise no acute anomaly seen. -In the ED, patient received Rocephin, Tylenol, dose of vancomycin, and the hospitalist service was contacted for further evaluation and  management     ROS: Complete and thorough 12 point review of systems obtained, negative listed in HPI.      Past Medical History:  Diagnosis Date  . Anxiety   . Arthritis    RA  . 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 Depression   . GERD (gastroesophageal reflux disease)   . History of blood transfusion    after miscarrige  . History of kidney stones    x 2  . Hypertension   . Hypothyroidism   . Incontinent of urine    incontinent at night  . Kidney failure   . Memory loss    "after TIA"  . Pneumonia    as a child and teenager  . Renal disorder    stage IV kidney disease  . Stroke ([facility])   . Thyroid disease   . TIA (transient ischemic attack) 06/2018    Past Surgical History:  Procedure Laterality Date  . CHOLECYSTECTOMY    . DILATION AND CURETTAGE OF UTERUS    . EYE SURGERY Right    retina surgery  . KNEE SURGERY     bilat knees  . LUMBAR LAMINECTOMY/DECOMPRESSION MICRODISCECTOMY Right 04/12/2018   Procedure: MICRODISCECTOMY LUMBAR FOUR- LUMBAR FIVE;  Surgeon: Newman Pies, MD;  Location: [facility];  Service: Neurosurgery;  Laterality: Right;  MICRODISCECTOMY LUMBAR FOUR- LUMBAR FIVE  . TONSILLECTOMY      Social History: Patient lives at nursing facility.  Primarily bedbound.  Non-smoker.  Allergies  Allergen Reactions  . Other Swelling  A bandage/gauze that was used on lower extremity wound at [facility] health, unsure type- Pt states her foot turned red and swelled up  . Penicillins Swelling and Rash    Has patient had a PCN reaction causing immediate rash, facial/tongue/throat swelling, SOB or lightheadedness with hypotension: Unknown Has patient had a PCN reaction causing severe rash involving mucus membranes or skin necrosis: Unknown Has patient had a PCN reaction that required hospitalization: No Has patient had a PCN reaction occurring within the last 10 years: No If all of the above answers are "NO", then may proceed with  Cephalosporin use.   . Sulfa Antibiotics Itching and Swelling  . Codeine Nausea Only  . Hydrocodone-Acetaminophen Other (See Comments)    Causes hallucinations  . Naproxen Sodium Swelling and Other (See Comments)    (ALEVE) Red Man Syndrome  . Tape Rash and Other (See Comments)    BURNS SKIN --PAPER TAPE IS OKAY    Family history: family history includes Atrial fibrillation in her mother; Stroke in her father.  Prior to Admission medications   Medication Sig Start Date End Date Taking? Authorizing Provider  amLODipine (NORVASC) 5 MG tablet Take 5 mg by mouth daily. 09/24/20   [provider]  apixaban (ELIQUIS) 5 MG TABS tablet Take 1 tablet (5 mg total) by mouth 2 (two) times daily. 11/15/20   Thurnell Lose, MD  baclofen (LIORESAL) 10 MG tablet Take 10 mg by mouth 2 (two) times daily as needed for muscle spasms. 02/12/20   [provider]  cyanocobalamin (,VITAMIN B-12,) 1000 MCG/ML injection Inject 1 mL into the skin every 28 (twenty-eight) days. 05/08/19   [provider]  DULoxetine (CYMBALTA) 60 MG capsule Take 60 mg by mouth at bedtime.    [provider]  estrogen, conjugated,-medroxyprogesterone (PREMPRO) 0.625-2.5 MG per tablet Take 1 tablet by mouth daily.    [provider]  Febuxostat 80 MG TABS Take 40 mg by mouth at bedtime.     [provider]  fluticasone (FLONASE) 50 MCG/ACT nasal spray Place 1 spray into both nostrils daily. 06/08/20   [provider]  furosemide (LASIX) 40 MG tablet Take 40 mg by mouth daily as needed for edema.     [provider]  gabapentin (NEURONTIN) 100 MG capsule Take 100 mg by mouth daily.    [provider]  HYDROcodone-acetaminophen (NORCO/VICODIN) 5-325 MG tablet Take 1 tablet by mouth every 12 (twelve) hours as needed for severe pain. 11/15/20   Thurnell Lose, MD  levothyroxine (SYNTHROID) 200 MCG tablet Take 200 mcg by mouth daily. Take 228mcg by  mouth Patient not taking: No sig reported 05/28/19   [provider]  mirtazapine (REMERON) 7.5 MG tablet Take 7.5 mg by mouth at bedtime. 10/05/20   [provider]  nystatin cream (MYCOSTATIN) Apply to affected area 2 times daily 04/12/19   Antonietta Breach, PA-C  pantoprazole (PROTONIX) 40 MG tablet Take 40 mg by mouth daily before breakfast. 02/22/18   [provider]  predniSONE (DELTASONE) 5 MG tablet Take 10 mg by mouth daily. 08/26/20   [provider]  SYNTHROID 175 MCG tablet Take 175 mcg by mouth daily before breakfast. Take 137mcg by mouth on Tuesdays, Thursdays, Saturdays and Sundays 01/05/18   [provider]       Physical Exam: BP (!) 154/80 (BP Location: Right Arm)   Pulse 96   Temp 98.4 F (36.9 C) (Oral)   Resp 18   Ht 5\' 4"  (  1.626 m)   Wt 73.3 kg   LMP  (LMP Unknown)   SpO2 100%   BMI 27.74 kg/m  General appearance: Well-developed, adult female, alert and in no acute distress.   Eyes: Anicteric, conjunctiva pink, lids and lashes normal. PERRL.    ENT: No nasal deformity, discharge, epistaxis.  Hearing intact. OP moist without lesions.   Neck: No neck masses.  Trachea midline.  No thyromegaly/tenderness. Lymph: No cervical or supraclavicular lymphadenopathy. Skin: Right upper extremity is edematous with erythema extending slightly proximal to elbow.  Multiple areas of bruising noted Cardiac: RRR, nl S1-S2, no murmurs appreciated.  1/2+ radial and pedal pulses 2+ and symmetric. Respiratory: Normal respiratory rate and rhythm.  Coarse breath sounds bilaterally, no wheezing Abdomen: Abdomen soft.  Nontender. No ascites, distension, hepatosplenomegaly.   Neuro: Cranial nerves 2 through 12 grossly intact.  Sensation intact to light touch. Speech is fluent.     Psych: Sensorium intact and responding to questions, attention normal.  Behavior appropriate.  Judgment and insight appear normal.     Labs on Admission:  I have  personally reviewed following labs and imaging studies: CBC: Recent Labs  Lab 01/01/21 1733  WBC 15.1*  NEUTROABS 14.0*  HGB 8.7*  HCT 28.3*  MCV 87.1  PLT 619   Basic Metabolic Panel: Recent Labs  Lab 01/01/21 1733 01/01/21 2130  NA 140  --   K 5.0  --   CL 108  --   CO2 19*  --   GLUCOSE 119*  --   BUN 57*  --   CREATININE 2.11*  --   CALCIUM 8.5*  --   MG  --  2.8*  PHOS  --  4.9*   GFR: Estimated Creatinine Clearance: 22.2 mL/min (A) (by C-G formula based on SCr of 2.11 mg/dL (H)).  Liver Function Tests: Recent Labs  Lab 01/01/21 1733  AST 32  ALT 31  ALKPHOS 129*  BILITOT 0.3  PROT 6.2*  ALBUMIN 3.1*   No results for input(s): LIPASE, AMYLASE in the last 168 hours. No results for input(s): AMMONIA in the last 168 hours. Coagulation Profile: Recent Labs  Lab 01/01/21 1733  INR 2.3*   Cardiac Enzymes: No results for input(s): CKTOTAL, CKMB, CKMBINDEX, TROPONINI in the last 168 hours. BNP (last 3 results) No results for input(s): PROBNP in the last 8760 hours. HbA1C: No results for input(s): HGBA1C in the last 72 hours. CBG: No results for input(s): GLUCAP in the last 168 hours. Lipid Profile: No results for input(s): CHOL, HDL, LDLCALC, TRIG, CHOLHDL, LDLDIRECT in the last 72 hours. Thyroid Function Tests: No results for input(s): TSH, T4TOTAL, FREET4, T3FREE, THYROIDAB in the last 72 hours. Anemia Panel: No results for input(s): VITAMINB12, FOLATE, FERRITIN, TIBC, IRON, RETICCTPCT in the last 72 hours.   No results found for this or any previous visit (from the past 240 hour(s)).         Radiological Exams on Admission: Personally reviewed reviewed imaging, chest x-ray grossly unremarkable.  CT abdomen pelvis showed trace bilateral pleural effusions, vague groundglass opacities in the lower lungs, otherwise no acute anomaly seen.  CT ABDOMEN PELVIS WO CONTRAST  Result Date: 01/01/2021 CLINICAL DATA:  Abdominal distention EXAM: CT ABDOMEN  AND PELVIS WITHOUT CONTRAST TECHNIQUE: Multidetector CT imaging of the abdomen and pelvis was performed following the standard protocol without IV contrast. COMPARISON:  03/12/2020 FINDINGS: Lower chest: Trace bilateral effusions. Ground-glass airspace opacities in both lower lobes could reflect early infiltrates. Heart is normal size.  Hepatobiliary: No focal liver abnormality is seen. Status post cholecystectomy. No biliary dilatation. Pancreas: No focal abnormality or ductal dilatation. Spleen: No focal abnormality.  Normal size. Adrenals/Urinary Tract: No adrenal abnormality. No focal renal abnormality. No stones or hydronephrosis. Urinary bladder is unremarkable. Stomach/Bowel: Normal appendix. Stomach, large and small bowel grossly unremarkable. Vascular/Lymphatic: Aortic atherosclerosis. Reproductive: Prior hysterectomy.  No adnexal masses. Other: No free fluid or free air. Musculoskeletal: No acute bony abnormality. Postoperative changes in the lower lumbar spine. IMPRESSION: Trace bilateral pleural effusions. Vague ground-glass opacities in the lower lungs could reflect early infiltrates or bronchopneumonia. Aortic atherosclerosis. No acute findings in the abdomen or pelvis. Electronically Signed   By: Rolm Baptise M.D.   On: 01/01/2021 18:25   DG Chest Port 1 View  Result Date: 01/01/2021 CLINICAL DATA:  Questionable sepsis EXAM: PORTABLE CHEST 1 VIEW COMPARISON:  11/05/2020 FINDINGS: Heart and mediastinal contours are within normal limits. No focal opacities or effusions. No acute bony abnormality. IMPRESSION: No active disease. Electronically Signed   By: Rolm Baptise M.D.   On: 01/01/2021 17:38          Assessment/Plan   1.  Right arm cellulitis -Suspect superimposed cellulitis with underlying edema -Previously was on doxycycline.  Started on vancomycin and Rocephin in ED.  Continue for now -Pharmacy consulted to help with vancomycin dosing given CKD 4 -Monitor wound closely.  If  cellulitis progresses, will consider starting clindamycin for toxin control -Wound care consulted  2.  Diffuse generalized edema -Suspect related to underlying CKD -At home appears to be on Lasix 40 mg orally daily.  We will start her on 40 mg IV daily for now.  We will not be super aggressive given cellulitis -Strict I's and O's  3.  CKD 4 -Creatinine on arrival 2.1.  Baseline creatinine appears to range from 2-2.5 -Suspect GFR actually lower than estimated, GFR 24, given low muscle mass.  Cystatin C will be beneficial but does not appear to be able to be performed here -Suspect patient will benefit from sodium bicarb tabs supplementation for chronic metabolic acidosis, but will hold off starting acutely given hypervolemia -Avoid NSAIDs and contrast  4.  Chronic right femoral DVT -Continue home Eliquis  5.  Normocytic anemia -On arrival hemoglobin 8.7.  Possibly related to slow GI bleeding, iron deficiency, and/or CKD -Previously hemoglobin was above 11 -Iron studies ordered  -Monitor for signs of GI bleeding, possible benefit from stool occult blood testing -No transfusion warranted this time, will continue to monitor closely  6.  Hypothyroidism -TSH currently 1.6 -Continue home Synthroid     DVT prophylaxis: Continue home Eliquis Code Status: Full Family Communication: None Disposition Plan: Anticipate discharge back to nursing facility when able Consults called: None Admission status: Inpatient    Medical decision making: Patient seen at 10:35 PM on 01/01/2021.  The patient was discussed with ER provider.  What exists of the patient's chart was reviewed in depth and summarized above.  Clinical condition: Watcher.        Doran Heater [facility] Please page though [place] or Epic secure chat:  For password, contact charge nurse

## 2021-01-01 NOTE — ED Triage Notes (Signed)
74 y.o female coming from [facility] nursing/rehab. Facility states pt has abnormal lab, . Pt hemoglobin 7.1 and has been trending down. Hx of B12 deficeincy. Pt Alert and oriented. Has generalized edema. Hx of CKD stage 4 and takes Lasix.  Pt denies dizziness and weakness.Denies sob but tachypneic per EMS   Vitals BP: 140/72 HR 96 RR 36 SpO2 98% on RA

## 2021-01-01 NOTE — ED Provider Notes (Signed)
[facility] DEPT Provider Note   CSN: [id number] Arrival date & time: 01/01/21  1546     History No chief complaint on file.   Carolyn Robertson is a 75 y.o. female.  Diffuse body swelling.  Worsening.  History of CKD 4.  On doxycycline for skin infection.  Right upper extremity of the forearm worsening pain tightness and redness.  No fevers chills.  Medications tried at home were not working.  She was told she needs to come here because of low hemoglobin reportedly at 7.1.  She denies chest pain shortness of breath or symptoms of anemia.        Past Medical History:  Diagnosis Date  . Anxiety   . Arthritis    RA  . 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 Depression   . GERD (gastroesophageal reflux disease)   . History of blood transfusion    after miscarrige  . History of kidney stones    x 2  . Hypertension   . Hypothyroidism   . Incontinent of urine    incontinent at night  . Kidney failure   . Memory loss    "after TIA"  . Pneumonia    as a child and teenager  . Renal disorder    stage IV kidney disease  . Stroke ([place])   . Thyroid disease   . TIA (transient ischemic attack) 06/2018    Patient Active Problem List   Diagnosis Date Noted  . COVID-19 virus infection 11/06/2020  . UTI (urinary tract infection) 11/05/2020  . Precordial chest pain 04/14/2020  . Tachycardia 04/14/2020  . Educated about COVID-19 virus infection 04/14/2020  . Palliative care by specialist   . Generalized weakness 02/16/2020  . Hypothyroidism 02/16/2020  . Acute cystitis 02/16/2020  . Elevated BP without diagnosis of hypertension 02/16/2020  . Spondylolisthesis, lumbar region 02/20/2019  . TIA (transient ischemic attack) 06/25/2018  . CVA (cerebrovascular accident) ([place]) 06/24/2018    Class: Acute  . Lumbar herniated disc 04/12/2018  . Stage 4 chronic kidney disease ([place]) 02/19/2018  . Chronic venous insufficiency  02/19/2018  . Primary localized osteoarthritis of right knee 11/14/2016    Past Surgical History:  Procedure Laterality Date  . CHOLECYSTECTOMY    . DILATION AND CURETTAGE OF UTERUS    . EYE SURGERY Right    retina surgery  . KNEE SURGERY     bilat knees  . LUMBAR LAMINECTOMY/DECOMPRESSION MICRODISCECTOMY Right 04/12/2018   Procedure: MICRODISCECTOMY LUMBAR FOUR- LUMBAR FIVE;  Surgeon: Newman Pies, MD;  Location: [facility];  Service: Neurosurgery;  Laterality: Right;  MICRODISCECTOMY LUMBAR FOUR- LUMBAR FIVE  . TONSILLECTOMY       OB History   No obstetric history on file.     Family History  Problem Relation Age of Onset  . Atrial fibrillation Mother   . Stroke Father     Social History   Tobacco Use  . Smoking status: Never Smoker  . Smokeless tobacco: Never Used  Vaping Use  . Vaping Use: Never used  Substance Use Topics  . Alcohol use: No    Comment: rarely  . Drug use: No    Home Medications Prior to Admission medications   Medication Sig Start Date End Date Taking? Authorizing Provider  amLODipine (NORVASC) 5 MG tablet Take 5 mg by mouth daily. 09/24/20   [provider]  apixaban (ELIQUIS) 5 MG TABS tablet Take 1 tablet (5 mg total) by mouth  2 (two) times daily. 11/15/20   Thurnell Lose, MD  baclofen (LIORESAL) 10 MG tablet Take 10 mg by mouth 2 (two) times daily as needed for muscle spasms. 02/12/20   [provider]  cyanocobalamin (,VITAMIN B-12,) 1000 MCG/ML injection Inject 1 mL into the skin every 28 (twenty-eight) days. 05/08/19   [provider]  DULoxetine (CYMBALTA) 60 MG capsule Take 60 mg by mouth at bedtime.    [provider]  estrogen, conjugated,-medroxyprogesterone (PREMPRO) 0.625-2.5 MG per tablet Take 1 tablet by mouth daily.    [provider]  Febuxostat 80 MG TABS Take 40 mg by mouth at bedtime.     [provider]  fluticasone (FLONASE) 50 MCG/ACT nasal spray Place 1 spray into both  nostrils daily. 06/08/20   [provider]  furosemide (LASIX) 40 MG tablet Take 40 mg by mouth daily as needed for edema.     [provider]  gabapentin (NEURONTIN) 100 MG capsule Take 100 mg by mouth daily.    [provider]  HYDROcodone-acetaminophen (NORCO/VICODIN) 5-325 MG tablet Take 1 tablet by mouth every 12 (twelve) hours as needed for severe pain. 11/15/20   Thurnell Lose, MD  levothyroxine (SYNTHROID) 200 MCG tablet Take 200 mcg by mouth daily. Take 224mcg by mouth Patient not taking: No sig reported 05/28/19   [provider]  mirtazapine (REMERON) 7.5 MG tablet Take 7.5 mg by mouth at bedtime. 10/05/20   [provider]  nystatin cream (MYCOSTATIN) Apply to affected area 2 times daily 04/12/19   Antonietta Breach, PA-C  pantoprazole (PROTONIX) 40 MG tablet Take 40 mg by mouth daily before breakfast. 02/22/18   [provider]  predniSONE (DELTASONE) 5 MG tablet Take 10 mg by mouth daily. 08/26/20   [provider]  SYNTHROID 175 MCG tablet Take 175 mcg by mouth daily before breakfast. Take 168mcg by mouth on Tuesdays, Thursdays, Saturdays and Sundays 01/05/18   [provider]    Allergies    Other, Penicillins, Sulfa antibiotics, Codeine, Hydrocodone-acetaminophen, Naproxen sodium, and Tape  Review of Systems   Review of Systems  Constitutional: Negative for chills and fever.  HENT: Negative for congestion and rhinorrhea.   Respiratory: Negative for cough and shortness of breath.   Cardiovascular: Positive for leg swelling. Negative for chest pain and palpitations.  Gastrointestinal: Negative for diarrhea, nausea and vomiting.  Genitourinary: Negative for difficulty urinating and dysuria.  Musculoskeletal: Negative for arthralgias and back pain.       Arm swelling  Skin: Positive for color change and wound. Negative for rash.  Neurological: Negative for light-headedness and headaches.  Hematological:  Bruises/bleeds easily.    Physical Exam Updated Vital Signs BP (!) 146/79   Pulse 96   Temp 97.8 F (36.6 C) (Oral)   Resp (!) 24   Ht 5\' 4"  (1.626 m)   Wt 63.5 kg   LMP  (LMP Unknown)   SpO2 99%   BMI 24.03 kg/m   Physical Exam Vitals and nursing note reviewed. Exam conducted with a chaperone present.  Constitutional:      General: She is not in acute distress.    Appearance: Normal appearance.  HENT:     Head: Normocephalic and atraumatic.     Nose: No rhinorrhea.  Eyes:     General:        Right eye: No discharge.        Left eye: No discharge.     Conjunctiva/sclera: Conjunctivae normal.  Cardiovascular:     Rate and Rhythm: Normal rate and regular rhythm.  Pulmonary:     Effort: Pulmonary effort is normal. No respiratory distress.     Breath sounds: No stridor.  Abdominal:     General: Abdomen is flat. There is no distension.     Palpations: Abdomen is soft.     Tenderness: There is no abdominal tenderness.  Musculoskeletal:        General: Swelling present.     Right lower leg: Edema present.     Left lower leg: Edema present.     Comments: Scattered ecchymosis.  Diffuse swelling to bilateral upper extremities worse on the right.  Tenderness erythema and warmth of the right.  No fluctuant masses felt.  Skin:    General: Skin is warm and dry.  Neurological:     General: No focal deficit present.     Mental Status: She is alert. Mental status is at baseline.     Motor: No weakness.  Psychiatric:        Mood and Affect: Mood normal.        Behavior: Behavior normal.     ED Results / Procedures / Treatments   Labs (all labs ordered are listed, but only abnormal results are displayed) Labs Reviewed  CBC WITH DIFFERENTIAL/PLATELET - Abnormal; Notable for the following components:      Result Value   WBC 15.1 (*)    RBC 3.25 (*)    Hemoglobin 8.7 (*)    HCT 28.3 (*)    RDW 16.9 (*)    Neutro Abs 14.0 (*)    Lymphs Abs 0.4 (*)    Abs Immature  Granulocytes 0.36 (*)    All other components within normal limits  COMPREHENSIVE METABOLIC PANEL - Abnormal; Notable for the following components:   CO2 19 (*)    Glucose, Bld 119 (*)    BUN 57 (*)    Creatinine, Ser 2.11 (*)    Calcium 8.5 (*)    Total Protein 6.2 (*)    Albumin 3.1 (*)    Alkaline Phosphatase 129 (*)    GFR, Estimated 24 (*)    All other components within normal limits  URINALYSIS, ROUTINE W REFLEX MICROSCOPIC - Abnormal; Notable for the following components:   APPearance CLOUDY (*)    Hgb urine dipstick MODERATE (*)    Protein, ur 100 (*)    Leukocytes,Ua LARGE (*)    WBC, UA >50 (*)    Bacteria, UA MANY (*)    All other components within normal limits  PROTIME-INR - Abnormal; Notable for the following components:   Prothrombin Time 24.9 (*)    INR 2.3 (*)    All other components within normal limits  APTT - Abnormal; Notable for the following components:   aPTT 43 (*)    All other components within normal limits  CULTURE, BLOOD (SINGLE)  URINE CULTURE  LACTIC ACID, PLASMA  TYPE AND SCREEN    EKG None  Radiology CT ABDOMEN PELVIS WO CONTRAST  Result Date: 01/01/2021 CLINICAL DATA:  Abdominal distention EXAM: CT ABDOMEN AND PELVIS WITHOUT CONTRAST TECHNIQUE: Multidetector CT imaging of the abdomen and pelvis was performed following the standard protocol without IV contrast. COMPARISON:  03/12/2020 FINDINGS: Lower chest: Trace bilateral effusions. Ground-glass airspace opacities in both lower lobes could reflect early infiltrates. Heart is normal size. Hepatobiliary: No focal liver abnormality is seen. Status post cholecystectomy. No biliary dilatation. Pancreas: No focal abnormality or ductal dilatation. Spleen: No  focal abnormality.  Normal size. Adrenals/Urinary Tract: No adrenal abnormality. No focal renal abnormality. No stones or hydronephrosis. Urinary bladder is unremarkable. Stomach/Bowel: Normal appendix. Stomach, large and small bowel grossly  unremarkable. Vascular/Lymphatic: Aortic atherosclerosis. Reproductive: Prior hysterectomy.  No adnexal masses. Other: No free fluid or free air. Musculoskeletal: No acute bony abnormality. Postoperative changes in the lower lumbar spine. IMPRESSION: Trace bilateral pleural effusions. Vague ground-glass opacities in the lower lungs could reflect early infiltrates or bronchopneumonia. Aortic atherosclerosis. No acute findings in the abdomen or pelvis. Electronically Signed   By: Rolm Baptise M.D.   On: 01/01/2021 18:25   DG Chest Port 1 View  Result Date: 01/01/2021 CLINICAL DATA:  Questionable sepsis EXAM: PORTABLE CHEST 1 VIEW COMPARISON:  11/05/2020 FINDINGS: Heart and mediastinal contours are within normal limits. No focal opacities or effusions. No acute bony abnormality. IMPRESSION: No active disease. Electronically Signed   By: Rolm Baptise M.D.   On: 01/01/2021 17:38    Procedures Procedures   Medications Ordered in ED Medications  vancomycin (VANCOCIN) IVPB 1000 mg/200 mL premix (1,000 mg Intravenous New Bag/Given 01/01/21 2022)  cefTRIAXone (ROCEPHIN) 1 g in sodium chloride 0.9 % 100 mL IVPB (0 g Intravenous Stopped 01/01/21 1927)    ED Course  I have reviewed the triage vital signs and the nursing notes.  Pertinent labs & imaging results that were available during my care of the patient were reviewed by me and considered in my medical decision making (see chart for details).  Clinical Course as of 01/01/21 2025  Fri Jan 01, 2021  1817 Lactic acid, plasma [EK]    Clinical Course User Index [EK] Breck Coons, MD   MDM Rules/Calculators/A&P                          Concerns for worsening skin infection and anemia.  We will get screening labs will get antibiotics.  Consulted pharmacy given her CKD 4.  Starting with Rocephin.  Cultures obtained.  Will limit fluid bolus as she is hemodynamically stable and she has volume overload in her extremities.  She has no respiratory symptoms  right now.  Labs show leukocytosis.  Patient remains hemodynamically stable.  No lactic acidosis.  Because of the patient's volume overload.  We will not fluid bolus.  Will give antibiotics.  I spoke with pharmacy, based on our understanding the patient status we will give Rocephin and vancomycin to treat what we think is a bad cellulitis of the right upper extremity.  Cultures were obtained and I feel this patient would warrant observation she has been on outpatient antibiotics and not getting better.  Also further evaluation to her fluid overload possibly further diagnostics needed.  The patient will be admitted to the hospitalist.  For the remainder this patient's care please see inpatient team notes.  I will intervene as needed while the patient remains in the emergency department.   Final Clinical Impression(s) / ED Diagnoses Final diagnoses:  Cellulitis of upper extremity, unspecified laterality    Rx / DC Orders ED Discharge Orders    None       Breck Coons, MD 01/01/21 2025

## 2021-01-01 NOTE — Progress Notes (Signed)
Pharmacy Antibiotic Note  Carolyn Robertson is a 75 y.o. female admitted on 01/01/2021 with cellulitis.  Pharmacy has been consulted for vancomycin dosing; Rocephin per MD. Renal function noted to be poor, but appears to be at baseline.  Plan:  Vancomycin 1000 mg IV q48 hr (est AUC 522 based on SCr 2.11; Vd 0.72)  Measure vancomycin AUC at steady state as indicated  SCr daily while on vanc d/t baseline renal insufficiency   Height: 5\' 4"  (162.6 cm) Weight: 63.5 kg (140 lb) IBW/kg (Calculated) : 54.7  Temp (24hrs), Avg:97.8 F (36.6 C), Min:97.8 F (36.6 C), Max:97.8 F (36.6 C)  Recent Labs  Lab 01/01/21 1650 01/01/21 1733  WBC  --  15.1*  CREATININE  --  2.11*  LATICACIDVEN 1.1  --     Estimated Creatinine Clearance: 19.6 mL/min (A) (by C-G formula based on SCr of 2.11 mg/dL (H)).    Allergies  Allergen Reactions  . Other Swelling    A bandage/gauze that was used on lower extremity wound at [facility] health, unsure type- Pt states her foot turned red and swelled up  . Penicillins Swelling and Rash    Has patient had a PCN reaction causing immediate rash, facial/tongue/throat swelling, SOB or lightheadedness with hypotension: Unknown Has patient had a PCN reaction causing severe rash involving mucus membranes or skin necrosis: Unknown Has patient had a PCN reaction that required hospitalization: No Has patient had a PCN reaction occurring within the last 10 years: No If all of the above answers are "NO", then may proceed with Cephalosporin use.   . Sulfa Antibiotics Itching and Swelling  . Codeine Nausea Only  . Hydrocodone-Acetaminophen Other (See Comments)    Causes hallucinations  . Naproxen Sodium Swelling and Other (See Comments)    (ALEVE) Red Man Syndrome  . Tape Rash and Other (See Comments)    BURNS SKIN --PAPER TAPE IS OKAY     Thank you for allowing pharmacy to be a part of this patient's care.  Saleem Coccia A 01/01/2021 9:36 PM

## 2021-01-01 NOTE — ED Notes (Signed)
Gave patient some ice chips per the MD

## 2021-01-01 NOTE — ED Notes (Signed)
Pt has significant peripheral edema. Pt states bilateral upper extremity swelling is new and weeping. She states " I did not have swelling like this yesterday'". Pt has multiple areas of ecchymosis on bilateral arms and bilateral lower legs.

## 2021-01-01 NOTE — ED Notes (Signed)
Pt given [place] sandwich and cola shasta.

## 2021-01-01 NOTE — ED Notes (Signed)
ED TO INPATIENT HANDOFF REPORT  Name/Age/Gender Carolyn Robertson 75 y.o. female  Code Status    Code Status Orders  (From admission, onward)         Start     Ordered   01/01/21 2112  Full code  Continuous        01/01/21 2113        Code Status History    Date Active Date Inactive Code Status Order ID Comments User Context   11/05/2020 2315 11/15/2020 1912 Full Code [id number]  Mansy, Arvella Merles, MD ED   02/16/2020 0954 02/19/2020 0227 Full Code [id number]  Chauncey Mann, MD ED   02/20/2019 1430 02/21/2019 1949 Full Code [id number]  Newman Pies, MD Inpatient   06/24/2018 1901 06/26/2018 1903 Full Code [id number]  Cristal Deer, MD Inpatient   04/12/2018 1655 04/13/2018 1225 Full Code [id number]  Newman Pies, MD Inpatient   Advance Care Planning Activity    Advance Directive Documentation   Flowsheet Row Most Recent Value  Type of Advance Directive Out of facility DNR (pink MOST or yellow form)  [Most form and DNR per Palliative Care Notes]  Pre-existing out of facility DNR order (yellow form or pink MOST form) Pink Most/Yellow Form available - Physician notified to receive inpatient order  "MOST" Form in Place? --      Home/SNF/Other Rehab  Chief Complaint Cellulitis [L03.90]  Level of Care/Admitting Diagnosis ED Disposition    ED Disposition Condition [place]: [facility] [100102]  Level of Care: Med-Surg [16]  May admit patient to [facility] or [facility] if equivalent level of care is available:: Yes  Covid Evaluation: Recent COVID positive no isolation required infection day 21-90  Diagnosis: Cellulitis [998338]  Admitting Physician: Doran Heater [[id number]]  Attending Physician: Doran Heater [[id number]]  Estimated length of stay: 3 - 4 days  Certification:: I certify this patient will need inpatient services for at least 2 midnights       Medical History Past Medical History:  Diagnosis Date  . Anxiety   .  Arthritis    RA  . 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 Depression   . GERD (gastroesophageal reflux disease)   . History of blood transfusion    after miscarrige  . History of kidney stones    x 2  . Hypertension   . Hypothyroidism   . Incontinent of urine    incontinent at night  . Kidney failure   . Memory loss    "after TIA"  . Pneumonia    as a child and teenager  . Renal disorder    stage IV kidney disease  . Stroke ([place])   . Thyroid disease   . TIA (transient ischemic attack) 06/2018    Allergies Allergies  Allergen Reactions  . Other Swelling    A bandage/gauze that was used on lower extremity wound at [facility], unsure type- Pt states her foot turned red and swelled up  . Penicillins Swelling and Rash    Has patient had a PCN reaction causing immediate rash, facial/tongue/throat swelling, SOB or lightheadedness with hypotension: Unknown Has patient had a PCN reaction causing severe rash involving mucus membranes or skin necrosis: Unknown Has patient had a PCN reaction that required hospitalization: No Has patient had a PCN reaction occurring within the last 10 years: No If all of the above answers are "NO", then may proceed with Cephalosporin  use.   . Sulfa Antibiotics Itching and Swelling  . Codeine Nausea Only  . Hydrocodone-Acetaminophen Other (See Comments)    Causes hallucinations  . Naproxen Sodium Swelling and Other (See Comments)    (ALEVE) Red Man Syndrome  . Tape Rash and Other (See Comments)    BURNS SKIN --PAPER TAPE IS OKAY    IV Location/Drains/Wounds Patient Lines/Drains/Airways Status    Active Line/Drains/Airways    Name Placement date Placement time Site Days   Peripheral IV 01/01/21 Left Antecubital 01/01/21  1751  Antecubital  less than 1   Pressure Injury 11/12/20 Buttocks Right Stage 2 -  Partial thickness loss of dermis presenting as a shallow open injury with a red, pink wound bed without slough.  11/12/20  0800  -- 50          Labs/Imaging Results for orders placed or performed during the hospital encounter of 01/01/21 (from the past 48 hour(s))  Lactic acid, plasma     Status: None   Collection Time: 01/01/21  4:50 PM  Result Value Ref Range   Lactic Acid, Venous 1.1 0.5 - 1.9 mmol/L    Comment: Performed at [facility], [place] [address], [place], [place] [address]  CBC with Differential     Status: Abnormal   Collection Time: 01/01/21  5:33 PM  Result Value Ref Range   WBC 15.1 (H) 4.0 - 10.5 K/uL   RBC 3.25 (L) 3.87 - 5.11 MIL/uL   Hemoglobin 8.7 (L) 12.0 - 15.0 g/dL   HCT 28.3 (L) 36.0 - 46.0 %   MCV 87.1 80.0 - 100.0 fL   MCH 26.8 26.0 - 34.0 pg   MCHC 30.7 30.0 - 36.0 g/dL   RDW 16.9 (H) 11.5 - 15.5 %   Platelets 366 150 - 400 K/uL   nRBC 0.0 0.0 - 0.2 %   Neutrophils Relative % 93 %   Neutro Abs 14.0 (H) 1.7 - 7.7 K/uL   Lymphocytes Relative 3 %   Lymphs Abs 0.4 (L) 0.7 - 4.0 K/uL   Monocytes Relative 2 %   Monocytes Absolute 0.3 0.1 - 1.0 K/uL   Eosinophils Relative 0 %   Eosinophils Absolute 0.0 0.0 - 0.5 K/uL   Basophils Relative 0 %   Basophils Absolute 0.0 0.0 - 0.1 K/uL   Immature Granulocytes 2 %   Abs Immature Granulocytes 0.36 (H) 0.00 - 0.07 K/uL    Comment: Performed at [facility], [place] [address] [place], [place] [address]  Comprehensive metabolic panel     Status: Abnormal   Collection Time: 01/01/21  5:33 PM  Result Value Ref Range   Sodium 140 135 - 145 mmol/L   Potassium 5.0 3.5 - 5.1 mmol/L   Chloride 108 98 - 111 mmol/L   CO2 19 (L) 22 - 32 mmol/L   Glucose, Bld 119 (H) 70 - 99 mg/dL    Comment: Glucose reference range applies only to samples taken after fasting for at least 8 hours.   BUN 57 (H) 8 - 23 mg/dL   Creatinine, Ser 2.11 (H) 0.44 - 1.00 mg/dL   Calcium 8.5 (L) 8.9 - 10.3 mg/dL   Total Protein 6.2 (L) 6.5 - 8.1 g/dL   Albumin 3.1 (L) 3.5 - 5.0 g/dL   AST 32 15 - 41 U/L   ALT 31 0 -  44 U/L   Alkaline Phosphatase 129 (H) 38 - 126 U/L   Total Bilirubin 0.3 0.3 - 1.2 mg/dL  GFR, Estimated 24 (L) >60 mL/min    Comment: (NOTE) Calculated using the CKD-EPI Creatinine Equation (2021)    Anion gap 13 5 - 15    Comment: Performed at [facility], [place] [address], [place], [place] [address]  Type and screen [place]     Status: None (Preliminary result)   Collection Time: 01/01/21  5:33 PM  Result Value Ref Range   ABO/RH(D) PENDING    Antibody Screen PENDING    Sample Expiration      01/04/2021,2359 Performed at [facility], [place] [address], [place], [place] [address]   Protime-INR     Status: Abnormal   Collection Time: 01/01/21  5:33 PM  Result Value Ref Range   Prothrombin Time 24.9 (H) 11.4 - 15.2 seconds   INR 2.3 (H) 0.8 - 1.2    Comment: (NOTE) INR goal varies based on device and disease states. Performed at [facility], [place] [address], [place], [place] [address]   APTT     Status: Abnormal   Collection Time: 01/01/21  5:33 PM  Result Value Ref Range   aPTT 43 (H) 24 - 36 seconds    Comment:        IF BASELINE aPTT IS ELEVATED, SUGGEST PATIENT RISK ASSESSMENT BE USED TO DETERMINE APPROPRIATE ANTICOAGULANT THERAPY. Performed at [facility], [place] [address], [place], [place] [address]   Urinalysis, Routine w reflex microscopic     Status: Abnormal   Collection Time: 01/01/21  7:27 PM  Result Value Ref Range   Color, Urine YELLOW YELLOW   APPearance CLOUDY (A) CLEAR   Specific Gravity, Urine 1.012 1.005 - 1.030   pH 5.0 5.0 - 8.0   Glucose, UA NEGATIVE NEGATIVE mg/dL   Hgb urine dipstick MODERATE (A) NEGATIVE   Bilirubin Urine NEGATIVE NEGATIVE   Ketones, ur NEGATIVE NEGATIVE mg/dL   Protein, ur 100 (A) NEGATIVE mg/dL   Nitrite NEGATIVE NEGATIVE   Leukocytes,Ua LARGE (A) NEGATIVE   RBC / HPF 21-50 0 - 5 RBC/hpf   WBC, UA >50 (H) 0 - 5 WBC/hpf    Bacteria, UA MANY (A) NONE SEEN   Squamous Epithelial / LPF 0-5 0 - 5    Comment: Performed at [facility], [place] [address] [place],  [address]   CT ABDOMEN PELVIS WO CONTRAST  Result Date: 01/01/2021 CLINICAL DATA:  Abdominal distention EXAM: CT ABDOMEN AND PELVIS WITHOUT CONTRAST TECHNIQUE: Multidetector CT imaging of the abdomen and pelvis was performed following the standard protocol without IV contrast. COMPARISON:  03/12/2020 FINDINGS: Lower chest: Trace bilateral effusions. Ground-glass airspace opacities in both lower lobes could reflect early infiltrates. Heart is normal size. Hepatobiliary: No focal liver abnormality is seen. Status post cholecystectomy. No biliary dilatation. Pancreas: No focal abnormality or ductal dilatation. Spleen: No focal abnormality.  Normal size. Adrenals/Urinary Tract: No adrenal abnormality. No focal renal abnormality. No stones or hydronephrosis. Urinary bladder is unremarkable. Stomach/Bowel: Normal appendix. Stomach, large and small bowel grossly unremarkable. Vascular/Lymphatic: Aortic atherosclerosis. Reproductive: Prior hysterectomy.  No adnexal masses. Other: No free fluid or free air. Musculoskeletal: No acute bony abnormality. Postoperative changes in the lower lumbar spine. IMPRESSION: Trace bilateral pleural effusions. Vague ground-glass opacities in the lower lungs could reflect early infiltrates or bronchopneumonia. Aortic atherosclerosis. No acute findings in the abdomen or pelvis. Electronically Signed   By: Rolm Baptise M.D.   On: 01/01/2021 18:25   DG Chest [facility]  Result Date: 01/01/2021 CLINICAL DATA:  Questionable sepsis EXAM: PORTABLE CHEST 1 VIEW COMPARISON:  11/05/2020 FINDINGS: Heart and mediastinal contours are within normal limits. No focal opacities or effusions. No acute bony abnormality. IMPRESSION: No active disease. Electronically Signed   By: Rolm Baptise M.D.   On: 01/01/2021 17:38    Pending  Labs Unresulted Labs (From admission, onward)          Start     Ordered   01/02/21 0500  Comprehensive metabolic panel  Tomorrow morning,   R        01/01/21 2113   01/02/21 0500  CBC  Tomorrow morning,   R        01/01/21 2113   01/01/21 2120  SARS CORONAVIRUS 2 (TAT 6-24 HRS) Nasopharyngeal Nasopharyngeal Swab  (Tier 3 - Symptomatic/asymptomatic with Precautions)  Once,   STAT       Question Answer Comment  Is this test for diagnosis or screening Screening   Symptomatic for COVID-19 as defined by CDC No   Hospitalized for COVID-19 No   Admitted to ICU for COVID-19 No   Previously tested for COVID-19 Yes   Resident in a congregate (group) care setting Yes   Employed in healthcare setting No   Pregnant No   Has patient completed COVID vaccination(s) (2 doses of Pfizer/Moderna 1 dose of [employer]) Unknown      01/01/21 2120   01/01/21 2056  Phosphorus  Add-on,   AD        01/01/21 2055   01/01/21 2056  Magnesium  Add-on,   AD        01/01/21 2055   01/01/21 2055  TSH  Add-on,   AD        01/01/21 2055   01/01/21 2055  T4, free  Add-on,   AD        01/01/21 2055   01/01/21 1650  Blood culture (routine single)  (Undifferentiated presentation (screening labs and basic nursing orders))  ONCE - STAT,   STAT        01/01/21 1650   01/01/21 1650  Urine culture  (Undifferentiated presentation (screening labs and basic nursing orders))  ONCE - STAT,   STAT        01/01/21 1650          Vitals/Pain Today's Vitals   01/01/21 1900 01/01/21 1930 01/01/21 2000 01/01/21 2023  BP: 140/76 (!) 146/79 (!) 151/69   Pulse: 99 96 97   Resp: (!) 25 (!) 24 (!) 21   Temp:      TempSrc:      SpO2: 98% 99% 98%   Weight:    63.5 kg  Height:    5\' 4"  (1.626 m)  PainSc:        Isolation Precautions Airborne and Contact precautions  Medications Medications  vancomycin (VANCOCIN) IVPB 1000 mg/200 mL premix (1,000 mg Intravenous New Bag/Given 01/01/21 2022)  acetaminophen (TYLENOL)  tablet 650 mg (has no administration in time range)    Or  acetaminophen (TYLENOL) suppository 650 mg (has no administration in time range)  ondansetron (ZOFRAN) tablet 4 mg (has no administration in time range)    Or  ondansetron (ZOFRAN) injection 4 mg (has no administration in time range)  polyethylene glycol (MIRALAX / GLYCOLAX) packet 17 g (has no administration in time range)  cefTRIAXone (ROCEPHIN) 1 g in sodium chloride 0.9 % 100 mL IVPB (has no administration in time range)  cefTRIAXone (ROCEPHIN) 1 g in sodium  chloride 0.9 % 100 mL IVPB (0 g Intravenous Stopped 01/01/21 1927)    Mobility non-ambulatory

## 2021-01-02 LAB — COMPREHENSIVE METABOLIC PANEL
ALT: 29 U/L (ref 0–44)
AST: 29 U/L (ref 15–41)
Albumin: 3.1 g/dL — ABNORMAL LOW (ref 3.5–5.0)
Alkaline Phosphatase: 118 U/L (ref 38–126)
Anion gap: 11 (ref 5–15)
BUN: 56 mg/dL — ABNORMAL HIGH (ref 8–23)
CO2: 20 mmol/L — ABNORMAL LOW (ref 22–32)
Calcium: 8.4 mg/dL — ABNORMAL LOW (ref 8.9–10.3)
Chloride: 106 mmol/L (ref 98–111)
Creatinine, Ser: 2.1 mg/dL — ABNORMAL HIGH (ref 0.44–1.00)
GFR, Estimated: 24 mL/min — ABNORMAL LOW (ref >60)
Glucose, Bld: 86 mg/dL (ref 70–99)
Potassium: 4.5 mmol/L (ref 3.5–5.1)
Sodium: 137 mmol/L (ref 135–145)
Total Bilirubin: 0.9 mg/dL (ref 0.3–1.2)
Total Protein: 5.8 g/dL — ABNORMAL LOW (ref 6.5–8.1)

## 2021-01-02 LAB — CBC
HCT: 28.1 % — ABNORMAL LOW (ref 36.0–46.0)
Hemoglobin: 8 g/dL — ABNORMAL LOW (ref 12.0–15.0)
MCH: 25.3 pg — ABNORMAL LOW (ref 26.0–34.0)
MCHC: 28.5 g/dL — ABNORMAL LOW (ref 30.0–36.0)
MCV: 88.9 fL (ref 80.0–100.0)
Platelets: 327 10*3/uL (ref 150–400)
RBC: 3.16 MIL/uL — ABNORMAL LOW (ref 3.87–5.11)
RDW: 16.6 % — ABNORMAL HIGH (ref 11.5–15.5)
WBC: 13.5 10*3/uL — ABNORMAL HIGH (ref 4.0–10.5)
nRBC: 0 % (ref 0.0–0.2)

## 2021-01-02 LAB — IRON AND TIBC
Iron: 22 ug/dL — ABNORMAL LOW (ref 28–170)
Saturation Ratios: 5 % — ABNORMAL LOW (ref 10.4–31.8)
TIBC: 417 ug/dL (ref 250–450)
UIBC: 395 ug/dL

## 2021-01-02 LAB — SARS CORONAVIRUS 2 (TAT 6-24 HRS): SARS Coronavirus 2: NEGATIVE

## 2021-01-02 LAB — T4, FREE: Free T4: 1.2 ng/dL — ABNORMAL HIGH (ref 0.61–1.12)

## 2021-01-02 LAB — VITAMIN B12: Vitamin B-12: 200 pg/mL (ref 180–914)

## 2021-01-02 LAB — FERRITIN: Ferritin: 18 ng/mL (ref 11–307)

## 2021-01-02 LAB — FOLATE: Folate: 13.6 ng/mL (ref >5.9)

## 2021-01-02 LAB — MRSA PCR SCREENING: MRSA by PCR: POSITIVE — AB

## 2021-01-02 MED ORDER — CONJ ESTROG-MEDROXYPROGEST ACE 0.625-2.5 MG PO TABS: 1.0000 | ORAL_TABLET | Freq: Every day | ORAL | Status: DC

## 2021-01-02 MED ORDER — LEVOTHYROXINE SODIUM 100 MCG PO TABS
200.0000 ug | ORAL_TABLET | ORAL | Status: DC
  Administered 2021-01-04: 200 ug via ORAL
  Filled 2021-01-02 (×2): qty 2

## 2021-01-02 MED ORDER — CHLORHEXIDINE GLUCONATE CLOTH 2 % EX PADS
6.0000 | MEDICATED_PAD | Freq: Every day | CUTANEOUS | Status: DC
  Administered 2021-01-03 – 2021-01-05 (×3): 6 via TOPICAL

## 2021-01-02 MED ORDER — MUPIROCIN 2 % EX OINT
1.0000 "application " | TOPICAL_OINTMENT | Freq: Two times a day (BID) | CUTANEOUS | Status: DC
  Administered 2021-01-02 – 2021-01-05 (×6): 1 via NASAL
  Filled 2021-01-02 (×2): qty 22

## 2021-01-02 MED ORDER — DULOXETINE HCL 60 MG PO CPEP
60.0000 mg | ORAL_CAPSULE | Freq: Every day | ORAL | Status: DC
  Administered 2021-01-02 – 2021-01-04 (×4): 60 mg via ORAL
  Filled 2021-01-02 (×4): qty 1

## 2021-01-02 MED ORDER — PREDNISONE 5 MG PO TABS
10.0000 mg | ORAL_TABLET | Freq: Every day | ORAL | Status: DC
  Administered 2021-01-02 – 2021-01-05 (×4): 10 mg via ORAL
  Filled 2021-01-02 (×4): qty 2

## 2021-01-02 MED ORDER — LEVOTHYROXINE SODIUM 50 MCG PO TABS
175.0000 ug | ORAL_TABLET | ORAL | Status: DC
  Administered 2021-01-02 – 2021-01-05 (×3): 175 ug via ORAL
  Filled 2021-01-02 (×4): qty 1

## 2021-01-02 MED ORDER — FUROSEMIDE 10 MG/ML IJ SOLN
40.0000 mg | Freq: Two times a day (BID) | INTRAMUSCULAR | Status: DC
  Administered 2021-01-02 – 2021-01-04 (×4): 40 mg via INTRAVENOUS
  Filled 2021-01-02 (×4): qty 4

## 2021-01-02 MED ORDER — ESTROGENS CONJUGATED 0.625 MG PO TABS
0.6250 mg | ORAL_TABLET | Freq: Every day | ORAL | Status: DC
  Administered 2021-01-02 – 2021-01-05 (×4): 0.625 mg via ORAL
  Filled 2021-01-02 (×4): qty 1

## 2021-01-02 MED ORDER — GABAPENTIN 100 MG PO CAPS
100.0000 mg | ORAL_CAPSULE | Freq: Every day | ORAL | Status: DC
Start: 2021-01-02 — End: 2021-01-06
  Administered 2021-01-02 – 2021-01-05 (×4): 100 mg via ORAL
  Filled 2021-01-02 (×4): qty 1

## 2021-01-02 MED ORDER — FEBUXOSTAT 40 MG PO TABS
40.0000 mg | ORAL_TABLET | Freq: Every day | ORAL | Status: DC
  Administered 2021-01-02 – 2021-01-04 (×3): 40 mg via ORAL
  Filled 2021-01-02 (×4): qty 1

## 2021-01-02 MED ORDER — PANTOPRAZOLE SODIUM 40 MG PO TBEC
40.0000 mg | DELAYED_RELEASE_TABLET | Freq: Every day | ORAL | Status: DC
  Administered 2021-01-02 – 2021-01-05 (×4): 40 mg via ORAL
  Filled 2021-01-02 (×5): qty 1

## 2021-01-02 MED ORDER — MEDROXYPROGESTERONE ACETATE 2.5 MG PO TABS
2.5000 mg | ORAL_TABLET | Freq: Every day | ORAL | Status: DC
  Administered 2021-01-02 – 2021-01-05 (×4): 2.5 mg via ORAL
  Filled 2021-01-02 (×4): qty 1

## 2021-01-02 MED ORDER — VANCOMYCIN HCL 1000 MG/200ML IV SOLN
1000.0000 mg | INTRAVENOUS | Status: DC
  Administered 2021-01-02: 1000 mg via INTRAVENOUS
  Filled 2021-01-02 (×2): qty 200

## 2021-01-02 MED ORDER — HYDROCODONE-ACETAMINOPHEN 5-325 MG PO TABS
1.0000 | ORAL_TABLET | Freq: Two times a day (BID) | ORAL | Status: DC | PRN
Start: 2021-01-02 — End: 2021-01-06
  Administered 2021-01-02 – 2021-01-05 (×6): 1 via ORAL
  Filled 2021-01-02 (×6): qty 1

## 2021-01-02 MED ORDER — APIXABAN 5 MG PO TABS
5.0000 mg | ORAL_TABLET | Freq: Two times a day (BID) | ORAL | Status: DC
  Administered 2021-01-02 – 2021-01-05 (×8): 5 mg via ORAL
  Filled 2021-01-02 (×8): qty 1

## 2021-01-02 MED ORDER — MIRTAZAPINE 15 MG PO TABS
7.5000 mg | ORAL_TABLET | Freq: Every day | ORAL | Status: DC
  Administered 2021-01-02 – 2021-01-04 (×4): 7.5 mg via ORAL
  Filled 2021-01-02 (×4): qty 1

## 2021-01-02 NOTE — Progress Notes (Signed)
PROGRESS NOTE    Carolyn Robertson  RKY:[id number] DOB: 07/13/1946 DOA: 01/01/2021 PCP: Burnard Bunting, MD   Chief Complaint  Patient presents with  . Abnormal Lab  Brief Narrative: 75 year old female with CKD stage IV, CVA, asthma, hypothyroidism, hypertension, obesity sent from [facility] skilled nursing facility showing anemia.  Patient was seen in the ED her hemoglobin was stable but found to have generalized anasarca swelling with right upper extremity cellulitis. Patient has been residing at the rehab facilities for the last 2 months.  She has not been ambulatory for more than a week.  Routine labs showed hemoglobin 7.1 g at the facility so sent to the ED. Patient also reports on and off swelling.  She was recently started on doxycycline for cellulitis. In the ED VS: Afebrile, heart rate 96, respiratory rate 27, blood pressure 136/70, maintaining sats on room air -Labs on initial presentation: Sodium 140, potassium 5, chloride 108, bicarb 19, glucose 119, BUN 57, creatinine 2.1, WBC 15.1, hemoglobin 8.7, UA: Leukocytes large, bacteria many, WBC >50 -Imaging: Chest x-ray grossly unremarkable.  CT abdomen pelvis showed trace bilateral pleural effusions, vague groundglass opacities in the lower lungs, otherwise no acute anomaly seen. -In the ED, patient received Rocephin, Tylenol, dose of vancomycin, and the hospitalist service was contacted for further evaluation and management  Subjective: Seen and examined this morning. Husband at the bedside. Husband and patient reports she feels significantly better-swelling has improved by 50% right upper extremity redness is much better. Patient has no specific complaint.  Assessment & Plan:  Right arm cellulitis in the setting of significant edema: Redness improving, continue current vancomycin and ceftriaxone.  Monitor renal function closely due to CKD.  Keep the arm elevated and continue to diurese with Lasix.  Wound care consulted.Blood  cultures pending, leukocytosis improving Recent Labs  Lab 01/01/21 1650 01/01/21 1733 01/02/21 0541  WBC  --  15.1* 13.5*  LATICACIDVEN 1.1  --   --    Anasarca/fluid overload with diffuse generalized edema: Suspect due to her underlying CKD.  She is unable to follow-up with the nephrology for the past 3 or 4 months generally on Lasix 40 mg daily.  Continue IV Lasix- will increase at 40 mg BID,monitor and follow closely if does not improve significantly in a day or two or if kidney function starts to worsen will need to get nephrology involved-she may be heading towards dialysis.Her weight has significantly increased from 57.6 kg in Jan 2022 now at 73kg.Monitor intake and output as below.   Wt Readings from Last 3 Encounters:  01/01/21 73.3 kg  11/05/20 57.6 kg  04/24/20 57.6 kg   Net IO Since Admission: -275 mL [01/02/21 1319]  Intake/Output Summary (Last 24 hours) at 01/02/2021 1319 Last data filed at 01/02/2021 3151 Gross per 24 hour  Intake --  Output 275 ml  Net -275 ml   CKD stage IV with fluid overload hypophosphatemia and metabolic acidosis: Followed by Dr. Moshe Cipro but unable to see for last few months.  BUN/creatinine appears stable.  Continue diuresis as above. Recent Labs  Lab 01/01/21 1733 01/02/21 0541  BUN 57* 56*  CREATININE 2.11* 2.10*   Hypertension- hold off amlodipine to allow room for iv lasix. BP is stable.  Chronic right femoral DVT continue Eliquis  Abnormal UA- f/u urine culture.  Normocytic anemia likely from chronic renal disease/chronic disease: Anemia panel shows ferritin normal iron slightly low 22. Check V61 and folic acid level.  Monitor and transfuse if less than 7 g  Generalized weakness/deconditioning: PT OT as tolerated.  TSH stable.  Hypothyroidism: TSH stable continue home Synthroid.  RA hx On chronic prednisone it seems?- asked pharmacy to complete med rec  Diet Order            Diet 2 gram sodium Room service appropriate? Yes;  Fluid consistency: Thin  Diet effective now               Patient's Body mass index is 27.74 kg/m.  Pressure Injury 11/12/20 Buttocks Right Stage 2 -  Partial thickness loss of dermis presenting as a shallow open injury with a red, pink wound bed without slough. (Active)  11/12/20 0800  Location: Buttocks  Location Orientation: Right  Staging: Stage 2 -  Partial thickness loss of dermis presenting as a shallow open injury with a red, pink wound bed without slough.  Wound Description (Comments):   Present on Admission: Yes   DVT prophylaxis: SCDs Start: 01/01/21 2112 eliquis Code Status:   Code Status: Full Code  Family Communication: plan of care discussed with patient at bedside and her husband.  Status is: Inpatient Remains inpatient appropriate because:IV treatments appropriate due to intensity of illness or inability to take PO and Inpatient level of care appropriate due to severity of illness  Dispo: The patient is from: SNF              Anticipated d/c is to: SNF              Patient currently is not medically stable to d/c.   Difficult to place patient No       Unresulted Labs (From admission, onward)          Start     Ordered   01/01/21 2120  SARS CORONAVIRUS 2 (TAT 6-24 HRS) Nasopharyngeal Nasopharyngeal Swab  (Tier 3 - Symptomatic/asymptomatic with Precautions)  Once,   STAT       Question Answer Comment  Is this test for diagnosis or screening Screening   Symptomatic for COVID-19 as defined by CDC No   Hospitalized for COVID-19 No   Admitted to ICU for COVID-19 No   Previously tested for COVID-19 Yes   Resident in a congregate (group) care setting Yes   Employed in healthcare setting No   Pregnant No   Has patient completed COVID vaccination(s) (2 doses of Pfizer/Moderna 1 dose of [employer]) Unknown      01/01/21 2120   01/01/21 1650  Urine culture  (Undifferentiated presentation (screening labs and basic nursing orders))  ONCE - STAT,   STAT         01/01/21 1650        Medications reviewed:  Scheduled Meds: . apixaban  5 mg Oral BID  . DULoxetine  60 mg Oral QHS  . estrogens (conjugated)  0.625 mg Oral Daily   And  . medroxyPROGESTERone  2.5 mg Oral Daily  . furosemide  40 mg Intravenous Daily  . gabapentin  100 mg Oral Daily  . levothyroxine  175 mcg Oral Once per day on Sun Tue Thu Sat  . mirtazapine  7.5 mg Oral QHS  . pantoprazole  40 mg Oral QAC breakfast  . predniSONE  10 mg Oral Q breakfast   Continuous Infusions: . cefTRIAXone (ROCEPHIN)  IV    . vancomycin      Consultants:see note  Procedures:see note  Antimicrobials: Anti-infectives (From admission, onward)   Start     Dose/Rate Route Frequency Ordered Stop   01/02/21  2200  vancomycin (VANCOCIN) IVPB 1000 mg/200 mL premix        1,000 mg 200 mL/hr over 60 Minutes Intravenous Every 48 hours 01/01/21 2136     01/02/21 1800  cefTRIAXone (ROCEPHIN) 1 g in sodium chloride 0.9 % 100 mL IVPB        1 g 200 mL/hr over 30 Minutes Intravenous Every 24 hours 01/01/21 2115     01/01/21 1930  vancomycin (VANCOCIN) IVPB 1000 mg/200 mL premix        1,000 mg 200 mL/hr over 60 Minutes Intravenous  Once 01/01/21 1927 01/01/21 2122   01/01/21 1830  cefTRIAXone (ROCEPHIN) 1 g in sodium chloride 0.9 % 100 mL IVPB        1 g 200 mL/hr over 30 Minutes Intravenous  Once 01/01/21 1820 01/01/21 1927     Culture/Microbiology    Component Value Date/Time   SDES  01/01/2021 1650    BLOOD LEFT ANTECUBITAL Performed at [facility], [place] [address], [place], [place] [address]    [place]  01/01/2021 1650    BOTTLES DRAWN AEROBIC AND ANAEROBIC Blood Culture adequate volume Performed at [facility] [address], [place], [place] [address]    CULT  01/01/2021 1650    NO GROWTH < 24 HOURS Performed at [facility] [address] [place], [place] [address]    REPTSTATUS PENDING 01/01/2021 1650    Other culture-see  note  Objective: Vitals: Today's Vitals   01/02/21 0535 01/02/21 0940 01/02/21 1021 01/02/21 1030  BP: (!) 152/74  135/75   Pulse: 99  93   Resp: 18  16   Temp: 98.2 F (36.8 C)  97.8 F (36.6 C)   TempSrc: Oral  Oral   SpO2: 96%  100%   Weight:      Height:      PainSc:  5   Asleep    Intake/Output Summary (Last 24 hours) at 01/02/2021 1314 Last data filed at 01/02/2021 0612 Gross per 24 hour  Intake --  Output 275 ml  Net -275 ml   Filed Weights   01/01/21 2023 01/01/21 2205  Weight: 63.5 kg 73.3 kg   Weight change:   Intake/Output from previous day: 03/18 0701 - 03/19 0700 In: -  Out: 275 [Urine:275] Intake/Output this shift: No intake/output data recorded. Filed Weights   01/01/21 2023 01/01/21 2205  Weight: 63.5 kg 73.3 kg    Examination: General exam: AAO, weak ,frail obese, weak appearing. HEENT:Oral mucosa moist, Ear/Nose WNL grossly,dentition normal. Respiratory system: bilaterally diminished but clear, no use of accessory muscle, non tender. Cardiovascular system: S1 & S2 +, regular, No JVD. Gastrointestinal system: Abdomen soft, NT,ND, BS+. Nervous System:Alert, awake, moving extremities and grossly nonfocal Extremities: b/l UE and LE edema+++, RUE erythematous and mildly tender, Skin: No rashes,no icterus. MSK: Normal muscle bulk,tone, power  Data Reviewed: I have personally reviewed following labs and imaging studies CBC: Recent Labs  Lab 01/01/21 1733 01/02/21 0541  WBC 15.1* 13.5*  NEUTROABS 14.0*  --   HGB 8.7* 8.0*  HCT 28.3* 28.1*  MCV 87.1 88.9  PLT 366 740   Basic Metabolic Panel: Recent Labs  Lab 01/01/21 1733 01/01/21 2130 01/02/21 0541  NA 140  --  137  K 5.0  --  4.5  CL 108  --  106  CO2 19*  --  20*  GLUCOSE 119*  --  86  BUN 57*  --  56*  CREATININE 2.11*  --  2.10*  CALCIUM 8.5*  --  8.4*  MG  --  2.8*  --   PHOS  --  4.9*  --    GFR: Estimated Creatinine Clearance: 22.3 mL/min (A) (by C-G formula based on  SCr of 2.1 mg/dL (H)). Liver Function Tests: Recent Labs  Lab 01/01/21 1733 01/02/21 0541  AST 32 29  ALT 31 29  ALKPHOS 129* 118  BILITOT 0.3 0.9  PROT 6.2* 5.8*  ALBUMIN 3.1* 3.1*   No results for input(s): LIPASE, AMYLASE in the last 168 hours. No results for input(s): AMMONIA in the last 168 hours. Coagulation Profile: Recent Labs  Lab 01/01/21 1733  INR 2.3*   Cardiac Enzymes: No results for input(s): CKTOTAL, CKMB, CKMBINDEX, TROPONINI in the last 168 hours. BNP (last 3 results) No results for input(s): PROBNP in the last 8760 hours. HbA1C: No results for input(s): HGBA1C in the last 72 hours. CBG: No results for input(s): GLUCAP in the last 168 hours. Lipid Profile: No results for input(s): CHOL, HDL, LDLCALC, TRIG, CHOLHDL, LDLDIRECT in the last 72 hours. Thyroid Function Tests: Recent Labs    01/01/21 1735 01/01/21 2120  TSH  --  1.622  FREET4 1.20*  --    Anemia Panel: Recent Labs    01/02/21 0541  FERRITIN 18  TIBC 417  IRON 22*   Sepsis Labs: Recent Labs  Lab 01/01/21 1650  LATICACIDVEN 1.1    Recent Results (from the past 240 hour(s))  Blood culture (routine single)     Status: None (Preliminary result)   Collection Time: 01/01/21  4:50 PM   Specimen: BLOOD  Result Value Ref Range Status   Specimen Description   Final    BLOOD LEFT ANTECUBITAL Performed at [facility], [place] [address] [place], [place] [address]    Special Requests   Final    BOTTLES DRAWN AEROBIC AND ANAEROBIC Blood Culture adequate volume Performed at [facility] [address] [place], [place] [address]    Culture   Final    NO GROWTH < 24 HOURS Performed at [facility] [address], [place], [place] [address]    Report Status PENDING  Incomplete     Radiology Studies: CT ABDOMEN PELVIS WO CONTRAST  Result Date: 01/01/2021 CLINICAL DATA:  Abdominal distention EXAM: CT ABDOMEN AND PELVIS WITHOUT CONTRAST  TECHNIQUE: Multidetector CT imaging of the abdomen and pelvis was performed following the standard protocol without IV contrast. COMPARISON:  03/12/2020 FINDINGS: Lower chest: Trace bilateral effusions. Ground-glass airspace opacities in both lower lobes could reflect early infiltrates. Heart is normal size. Hepatobiliary: No focal liver abnormality is seen. Status post cholecystectomy. No biliary dilatation. Pancreas: No focal abnormality or ductal dilatation. Spleen: No focal abnormality.  Normal size. Adrenals/Urinary Tract: No adrenal abnormality. No focal renal abnormality. No stones or hydronephrosis. Urinary bladder is unremarkable. Stomach/Bowel: Normal appendix. Stomach, large and small bowel grossly unremarkable. Vascular/Lymphatic: Aortic atherosclerosis. Reproductive: Prior hysterectomy.  No adnexal masses. Other: No free fluid or free air. Musculoskeletal: No acute bony abnormality. Postoperative changes in the lower lumbar spine. IMPRESSION: Trace bilateral pleural effusions. Vague ground-glass opacities in the lower lungs could reflect early infiltrates or bronchopneumonia. Aortic atherosclerosis. No acute findings in the abdomen or pelvis. Electronically Signed   By: Rolm Baptise M.D.   On: 01/01/2021 18:25   DG Chest Port 1 View  Result Date: 01/01/2021 CLINICAL DATA:  Questionable sepsis EXAM: PORTABLE CHEST 1 VIEW COMPARISON:  11/05/2020 FINDINGS: Heart and  mediastinal contours are within normal limits. No focal opacities or effusions. No acute bony abnormality. IMPRESSION: No active disease. Electronically Signed   By: Rolm Baptise M.D.   On: 01/01/2021 17:38     LOS: 1 day   Antonieta Pert, MD [facility]  01/02/2021, 1:14 PM

## 2021-01-03 LAB — COMPREHENSIVE METABOLIC PANEL
ALT: 30 U/L (ref 0–44)
AST: 30 U/L (ref 15–41)
Albumin: 3 g/dL — ABNORMAL LOW (ref 3.5–5.0)
Alkaline Phosphatase: 115 U/L (ref 38–126)
Anion gap: 14 (ref 5–15)
BUN: 57 mg/dL — ABNORMAL HIGH (ref 8–23)
CO2: 19 mmol/L — ABNORMAL LOW (ref 22–32)
Calcium: 8.1 mg/dL — ABNORMAL LOW (ref 8.9–10.3)
Chloride: 104 mmol/L (ref 98–111)
Creatinine, Ser: 2.1 mg/dL — ABNORMAL HIGH (ref 0.44–1.00)
GFR, Estimated: 24 mL/min — ABNORMAL LOW (ref >60)
Glucose, Bld: 109 mg/dL — ABNORMAL HIGH (ref 70–99)
Potassium: 3.7 mmol/L (ref 3.5–5.1)
Sodium: 137 mmol/L (ref 135–145)
Total Bilirubin: 0.7 mg/dL (ref 0.3–1.2)
Total Protein: 5.9 g/dL — ABNORMAL LOW (ref 6.5–8.1)

## 2021-01-03 LAB — CBC
HCT: 28 % — ABNORMAL LOW (ref 36.0–46.0)
Hemoglobin: 8.1 g/dL — ABNORMAL LOW (ref 12.0–15.0)
MCH: 25.7 pg — ABNORMAL LOW (ref 26.0–34.0)
MCHC: 28.9 g/dL — ABNORMAL LOW (ref 30.0–36.0)
MCV: 88.9 fL (ref 80.0–100.0)
Platelets: 342 10*3/uL (ref 150–400)
RBC: 3.15 MIL/uL — ABNORMAL LOW (ref 3.87–5.11)
RDW: 16.7 % — ABNORMAL HIGH (ref 11.5–15.5)
WBC: 13.4 10*3/uL — ABNORMAL HIGH (ref 4.0–10.5)
nRBC: 0.1 % (ref 0.0–0.2)

## 2021-01-03 LAB — URINE CULTURE

## 2021-01-03 MED ORDER — SODIUM CHLORIDE 0.9 % IV SOLN
510.0000 mg | Freq: Once | INTRAVENOUS | Status: AC
  Administered 2021-01-03: 510 mg via INTRAVENOUS
  Filled 2021-01-03: qty 510

## 2021-01-03 MED ORDER — HYDRALAZINE HCL 25 MG PO TABS
25.0000 mg | ORAL_TABLET | Freq: Two times a day (BID) | ORAL | Status: DC
  Administered 2021-01-03 – 2021-01-05 (×4): 25 mg via ORAL
  Filled 2021-01-03 (×4): qty 1

## 2021-01-03 MED ORDER — VITAMIN B-12 1000 MCG PO TABS
1000.0000 ug | ORAL_TABLET | Freq: Every day | ORAL | Status: DC
  Administered 2021-01-03 – 2021-01-05 (×3): 1000 ug via ORAL
  Filled 2021-01-03 (×3): qty 1

## 2021-01-03 NOTE — Progress Notes (Signed)
PROGRESS NOTE    Carolyn Robertson  IEP:[id number] DOB: 16-Sep-1946 DOA: 01/01/2021 PCP: Burnard Bunting, MD   Chief Complaint  Patient presents with  . Abnormal Lab   Brief Narrative: 75 year old female with CKD stage IV, CVA, asthma, hypothyroidism, hypertension, sent from [facility] skilled nursing facility showing anemia.  Patient was seen in the ED her hemoglobin was stable but found to have generalized anasarca swelling with right upper extremity cellulitis. Patient has been residing at the rehab facilities for the last 2 months.  She has not been ambulatory for more than a week.  Routine labs showed hemoglobin 7.1 at the facility so sent to the ED. Patient also reports on and off swelling.  She was recently started on doxycycline for cellulitis. In the ED, VSS. Labs showed BUN 57, creatinine 2.1, WBC 15.1, UA showed large leukocytes, many bacteria, WBC greater than 50. Chest x-ray grossly unremarkable.  CT abdomen pelvis showed trace bilateral pleural effusions, vague groundglass opacities in the lower lungs, otherwise no acute anomaly seen. In the ED, patient received Rocephin, Tylenol, dose of vancomycin, and the hospitalist service was contacted for further evaluation and management.    Subjective: Patient seen and examined at bedside, patient reports some mild improvement in right upper extremity swelling and erythema, although still persistent.  Denies any new complaints.  Still with significant BLE edema, although as noted by patient and her husband who is at bedside, notes mild improvment as well seen and examined this morning.  Assessment & Plan:  Right arm cellulitis in the setting of significant edema Currently afebrile, with leukocytosis BC, no growth to date Continue vancomycin, ceftriaxone Continue to keep arm elevated Continue with diuresis Wound care consulted, appreciate recs  Anasarca/fluid overload with diffuse generalized edema Suspect due to her underlying CKD  vs ?malnutrition (albumin low) Pre-albumin pending Continue IV Lasix Strict I's and O's, daily weights  CKD stage IV with fluid overload hypophosphatemia and metabolic acidosis Followed by Dr. Moshe Cipro but unable to see for last few months BUN/creatinine appears stable Continue diuresis as above  Hypertension BP uncontrolled Restart home hydralazine, continue to hold off amlodipine for now  Chronic right femoral DVT Continue Eliquis  Abnormal UA Urine culture grew multiple species, patient asymptomatic Continue antibiotics as above  Iron deficiency/anmeia of CKD Baseline hemoglobin around 11, no evidence of bleeding Iron panel shows iron 22, sats 5%, ferritin 18, vitamin Y60-->630, folic acid WNL Give 1 dose of IV Feraheme on 01/03/2021 Daily supplementation of vitamin B12 Monitor and transfuse if less than 7 g  Generalized weakness/deconditioning PT/OT  Hypothyroidism Continue home Synthroid  RA Vs Gout hx Continue chronic prednisone     Diet Order            Diet 2 gram sodium Room service appropriate? Yes; Fluid consistency: Thin  Diet effective now               Patient's Body mass index is 27.74 kg/m.  Pressure Injury 11/12/20 Buttocks Right Stage 2 -  Partial thickness loss of dermis presenting as a shallow open injury with a red, pink wound bed without slough. (Active)  11/12/20 0800  Location: Buttocks  Location Orientation: Right  Staging: Stage 2 -  Partial thickness loss of dermis presenting as a shallow open injury with a red, pink wound bed without slough.  Wound Description (Comments):   Present on Admission: Yes   DVT prophylaxis: SCDs Start: 01/01/21 2112 eliquis Code Status:   Code Status: Full Code  Family  Communication: Discussed with patient and husband at bedside on 01/03/2021    Status is: Inpatient Remains inpatient appropriate because:IV treatments appropriate due to intensity of illness or inability to take PO and Inpatient  level of care appropriate due to severity of illness  Dispo: The patient is from: SNF              Anticipated d/c is to: SNF              Patient currently is not medically stable to d/c.   Difficult to place patient No       Unresulted Labs (From admission, onward)          Start     Ordered   01/03/21 0500  Comprehensive metabolic panel  Daily,   R      01/02/21 1324   01/03/21 0500  CBC  Daily,   R      01/02/21 1324        Medications reviewed:  Scheduled Meds: . apixaban  5 mg Oral BID  . Chlorhexidine Gluconate Cloth  6 each Topical Q0600  . DULoxetine  60 mg Oral QHS  . estrogens (conjugated)  0.625 mg Oral Daily   And  . medroxyPROGESTERone  2.5 mg Oral Daily  . febuxostat  40 mg Oral QHS  . furosemide  40 mg Intravenous BID  . gabapentin  100 mg Oral Daily  . levothyroxine  175 mcg Oral Once per day on Sun Tue Thu Sat  . [START ON 01/04/2021] levothyroxine  200 mcg Oral Q M,W,F  . mirtazapine  7.5 mg Oral QHS  . mupirocin ointment  1 application Nasal BID  . pantoprazole  40 mg Oral QAC breakfast  . predniSONE  10 mg Oral Q breakfast   Continuous Infusions: . cefTRIAXone (ROCEPHIN)  IV Stopped (01/02/21 1747)  . vancomycin Stopped (01/02/21 2242)    Consultants:see note  Procedures:see note  Antimicrobials: Anti-infectives (From admission, onward)   Start     Dose/Rate Route Frequency Ordered Stop   01/02/21 2200  vancomycin (VANCOCIN) IVPB 1000 mg/200 mL premix  Status:  Discontinued        1,000 mg 200 mL/hr over 60 Minutes Intravenous Every 48 hours 01/01/21 2136 01/02/21 1735   01/02/21 2200  vancomycin (VANCOREADY) IVPB 1000 mg/200 mL        1,000 mg 200 mL/hr over 60 Minutes Intravenous Every 48 hours 01/02/21 1735     01/02/21 1800  cefTRIAXone (ROCEPHIN) 1 g in sodium chloride 0.9 % 100 mL IVPB        1 g 200 mL/hr over 30 Minutes Intravenous Every 24 hours 01/01/21 2115     01/01/21 1930  vancomycin (VANCOCIN) IVPB 1000 mg/200 mL premix         1,000 mg 200 mL/hr over 60 Minutes Intravenous  Once 01/01/21 1927 01/01/21 2122   01/01/21 1830  cefTRIAXone (ROCEPHIN) 1 g in sodium chloride 0.9 % 100 mL IVPB        1 g 200 mL/hr over 30 Minutes Intravenous  Once 01/01/21 1820 01/01/21 1927     Culture/Microbiology    Component Value Date/Time   SDES  01/01/2021 1927    IN/OUT CATH URINE Performed at [facility], [place] [address] [place], [place] [address]    SPECREQUEST  01/01/2021 1927    NONE Performed at [facility], [place] [address] [place], [place] [address]    CULT MULTIPLE SPECIES PRESENT, SUGGEST RECOLLECTION (A)  01/01/2021 1927   REPTSTATUS 01/03/2021 FINAL 01/01/2021 1927    Other culture-see note  Objective: Vitals: Today's Vitals   01/02/21 2133 01/02/21 2218 01/03/21 0515 01/03/21 1451  BP:   (!) 151/91 (!) 148/68  Pulse:   (!) 106 (!) 102  Resp:   20 16  Temp:   97.7 F (36.5 C) 97.7 F (36.5 C)  TempSrc:   Oral Oral  SpO2:   99% 98%  Weight:      Height:      PainSc: 2  Asleep      Intake/Output Summary (Last 24 hours) at 01/03/2021 1453 Last data filed at 01/03/2021 0948 Gross per 24 hour  Intake 1141.03 ml  Output 1400 ml  Net -258.97 ml   Filed Weights   01/01/21 2023 01/01/21 2205  Weight: 63.5 kg 73.3 kg   Weight change:   Intake/Output from previous day: 03/19 0701 - 03/20 0700 In: 1261 [P.O.:960; IV Piggyback:301] Out: 1400 [Urine:1400] Intake/Output this shift: Total I/O In: 240 [P.O.:240] Out: -  Filed Weights   01/01/21 2023 01/01/21 2205  Weight: 63.5 kg 73.3 kg    Examination:  General: NAD, deconditioned, weak  Cardiovascular: S1, S2 present  Respiratory:  Diminished breath sounds bilaterally  Abdomen: Soft, nontender, nondistended, bowel sounds present  Musculoskeletal: Bilateral upper extremity swelling, worse on the RUE, with warmth and erythema, 3+ bilateral lower extremity edema  Skin:  Noted warmth, erythema on  RUE  Psychiatry: Normal mood   Data Reviewed: I have personally reviewed following labs and imaging studies CBC: Recent Labs  Lab 01/01/21 1733 01/02/21 0541 01/03/21 0531  WBC 15.1* 13.5* 13.4*  NEUTROABS 14.0*  --   --   HGB 8.7* 8.0* 8.1*  HCT 28.3* 28.1* 28.0*  MCV 87.1 88.9 88.9  PLT 366 327 169   Basic Metabolic Panel: Recent Labs  Lab 01/01/21 1733 01/01/21 2130 01/02/21 0541 01/03/21 0531  NA 140  --  137 137  K 5.0  --  4.5 3.7  CL 108  --  106 104  CO2 19*  --  20* 19*  GLUCOSE 119*  --  86 109*  BUN 57*  --  56* 57*  CREATININE 2.11*  --  2.10* 2.10*  CALCIUM 8.5*  --  8.4* 8.1*  MG  --  2.8*  --   --   PHOS  --  4.9*  --   --    GFR: Estimated Creatinine Clearance: 22.3 mL/min (A) (by C-G formula based on SCr of 2.1 mg/dL (H)). Liver Function Tests: Recent Labs  Lab 01/01/21 1733 01/02/21 0541 01/03/21 0531  AST 32 29 30  ALT 31 29 30   ALKPHOS 129* 118 115  BILITOT 0.3 0.9 0.7  PROT 6.2* 5.8* 5.9*  ALBUMIN 3.1* 3.1* 3.0*   No results for input(s): LIPASE, AMYLASE in the last 168 hours. No results for input(s): AMMONIA in the last 168 hours. Coagulation Profile: Recent Labs  Lab 01/01/21 1733  INR 2.3*   Cardiac Enzymes: No results for input(s): CKTOTAL, CKMB, CKMBINDEX, TROPONINI in the last 168 hours. BNP (last 3 results) No results for input(s): PROBNP in the last 8760 hours. HbA1C: No results for input(s): HGBA1C in the last 72 hours. CBG: No results for input(s): GLUCAP in the last 168 hours. Lipid Profile: No results for input(s): CHOL, HDL, LDLCALC, TRIG, CHOLHDL, LDLDIRECT in the last 72 hours. Thyroid Function Tests: Recent Labs    01/01/21 1735 01/01/21 2120  TSH  --  1.622  FREET4 1.20*  --    Anemia Panel: Recent Labs    01/02/21 0534 01/02/21 0541  VITAMINB12 200  --   FOLATE  --  13.6  FERRITIN  --  18  TIBC  --  417  IRON  --  22*   Sepsis Labs: Recent Labs  Lab 01/01/21 1650  LATICACIDVEN 1.1     Recent Results (from the past 240 hour(s))  Blood culture (routine single)     Status: None (Preliminary result)   Collection Time: 01/01/21  4:50 PM   Specimen: BLOOD  Result Value Ref Range Status   Specimen Description   Final    BLOOD LEFT ANTECUBITAL Performed at [facility], [place] [address], [place], [place] [address]    Special Requests   Final    BOTTLES DRAWN AEROBIC AND ANAEROBIC Blood Culture adequate volume Performed at [place] [address] [place], [place] [address]    Culture   Final    NO GROWTH < 24 HOURS Performed at [place] [address], [place], [place] [address]    Report Status PENDING  Incomplete  Urine culture     Status: Abnormal   Collection Time: 01/01/21  7:27 PM   Specimen: In/Out Cath Urine  Result Value Ref Range Status   Specimen Description   Final    IN/OUT CATH URINE Performed at [place] [address], [place], [place] [address]    Special Requests   Final    NONE Performed at [facility], [place] [address], [place], [place] [address]    Culture MULTIPLE SPECIES PRESENT, SUGGEST RECOLLECTION (A)  Final   Report Status 01/03/2021 FINAL  Final  SARS CORONAVIRUS 2 (TAT 6-24 HRS) Nasopharyngeal Nasopharyngeal Swab     Status: None   Collection Time: 01/01/21  9:20 PM   Specimen: Nasopharyngeal Swab  Result Value Ref Range Status   SARS Coronavirus 2 NEGATIVE NEGATIVE Final    Comment: (NOTE) SARS-CoV-2 target nucleic acids are NOT DETECTED.  The SARS-CoV-2 RNA is generally detectable in upper and lower respiratory specimens during the acute phase of infection. Negative results do not preclude SARS-CoV-2 infection, do not rule out co-infections with other pathogens, and should not be used as the sole basis for treatment or other patient management decisions. Negative results must be combined with clinical observations, patient  history, and epidemiological information. The expected result is Negative.  Fact Sheet for Patients: SugarRoll.be  Fact Sheet for Healthcare Providers: https://www.woods-mathews.com/  This test is not yet approved or cleared by the [place] FDA and  has been authorized for detection and/or diagnosis of SARS-CoV-2 by FDA under an Emergency Use Authorization (EUA). This EUA will remain  in effect (meaning this test can be used) for the duration of the COVID-19 declaration under Se ction 564(b)(1) of the Act, 21 U.S.C. section 360bbb-3(b)(1), unless the authorization is terminated or revoked sooner.  Performed at [facility], [place] [address] [place], [place] [address]   MRSA PCR Screening     Status: Abnormal   Collection Time: 01/02/21  5:52 PM   Specimen: Nasopharyngeal  Result Value Ref Range Status   MRSA by PCR POSITIVE (A) NEGATIVE Final    Comment:        The GeneXpert MRSA Assay (FDA approved for NASAL specimens only), is one component of a comprehensive MRSA colonization surveillance program. It is not intended to diagnose MRSA infection nor to  guide or monitor treatment for MRSA infections. RESULT CALLED TO, READ BACK BY AND VERIFIED WITH: Vance Gather RN 01/02/21 @1906  BY P.HENDERSON Performed at [place] [facility] [address], [place], [place] [address]      Radiology Studies: CT ABDOMEN PELVIS WO CONTRAST  Result Date: 01/01/2021 CLINICAL DATA:  Abdominal distention EXAM: CT ABDOMEN AND PELVIS WITHOUT CONTRAST TECHNIQUE: Multidetector CT imaging of the abdomen and pelvis was performed following the standard protocol without IV contrast. COMPARISON:  03/12/2020 FINDINGS: Lower chest: Trace bilateral effusions. Ground-glass airspace opacities in both lower lobes could reflect early infiltrates. Heart is normal size. Hepatobiliary: No focal liver abnormality is seen. Status post cholecystectomy. No  biliary dilatation. Pancreas: No focal abnormality or ductal dilatation. Spleen: No focal abnormality.  Normal size. Adrenals/Urinary Tract: No adrenal abnormality. No focal renal abnormality. No stones or hydronephrosis. Urinary bladder is unremarkable. Stomach/Bowel: Normal appendix. Stomach, large and small bowel grossly unremarkable. Vascular/Lymphatic: Aortic atherosclerosis. Reproductive: Prior hysterectomy.  No adnexal masses. Other: No free fluid or free air. Musculoskeletal: No acute bony abnormality. Postoperative changes in the lower lumbar spine. IMPRESSION: Trace bilateral pleural effusions. Vague ground-glass opacities in the lower lungs could reflect early infiltrates or bronchopneumonia. Aortic atherosclerosis. No acute findings in the abdomen or pelvis. Electronically Signed   By: Rolm Baptise M.D.   On: 01/01/2021 18:25   DG Chest Port 1 View  Result Date: 01/01/2021 CLINICAL DATA:  Questionable sepsis EXAM: PORTABLE CHEST 1 VIEW COMPARISON:  11/05/2020 FINDINGS: Heart and mediastinal contours are within normal limits. No focal opacities or effusions. No acute bony abnormality. IMPRESSION: No active disease. Electronically Signed   By: Rolm Baptise M.D.   On: 01/01/2021 17:38     LOS: 2 days   Alma Friendly, MD [facility]  01/03/2021, 2:53 PM

## 2021-01-03 NOTE — Consult Note (Signed)
[place] Nurse Consult Note: Reason for Consult: Right upper extremity cellulitis. Now on day 3 of vancomycin with marked improvement.  By the patient's estimation, she reports swelling down more than 50% today. No weeping. Wound type: infectious Pressure Injury POA: N/A Measurement:N/A Wound bed:N/A Drainage (amount, consistency, odor) N/A Periwound: ecchymosis, linear crevices where decrease in edema is evident Dressing procedure/placement/frequency: No topical care is indicated at this time however, I will add elevation of the extremity. Additionally, our silk-like antimicrobial linen (DermaTherapy) will provide comfort and antimicrobial support. Other pressure injury prevention interventions are added top POC.   [place] nursing team will not follow, but will remain available to this patient, the nursing and medical teams.  Please re-consult if needed. Thanks, Maudie Flakes, MSN, RN, [place], Arther Abbott  Pager# [phone]

## 2021-01-04 LAB — COMPREHENSIVE METABOLIC PANEL
ALT: 26 U/L (ref 0–44)
AST: 25 U/L (ref 15–41)
Albumin: 2.9 g/dL — ABNORMAL LOW (ref 3.5–5.0)
Alkaline Phosphatase: 110 U/L (ref 38–126)
Anion gap: 11 (ref 5–15)
BUN: 57 mg/dL — ABNORMAL HIGH (ref 8–23)
CO2: 22 mmol/L (ref 22–32)
Calcium: 7.9 mg/dL — ABNORMAL LOW (ref 8.9–10.3)
Chloride: 104 mmol/L (ref 98–111)
Creatinine, Ser: 2.08 mg/dL — ABNORMAL HIGH (ref 0.44–1.00)
GFR, Estimated: 24 mL/min — ABNORMAL LOW (ref >60)
Glucose, Bld: 86 mg/dL (ref 70–99)
Potassium: 2.8 mmol/L — ABNORMAL LOW (ref 3.5–5.1)
Sodium: 137 mmol/L (ref 135–145)
Total Bilirubin: 1.1 mg/dL (ref 0.3–1.2)
Total Protein: 5.4 g/dL — ABNORMAL LOW (ref 6.5–8.1)

## 2021-01-04 LAB — CBC
HCT: 26.6 % — ABNORMAL LOW (ref 36.0–46.0)
Hemoglobin: 7.6 g/dL — ABNORMAL LOW (ref 12.0–15.0)
MCH: 25.2 pg — ABNORMAL LOW (ref 26.0–34.0)
MCHC: 28.6 g/dL — ABNORMAL LOW (ref 30.0–36.0)
MCV: 88.1 fL (ref 80.0–100.0)
Platelets: 315 10*3/uL (ref 150–400)
RBC: 3.02 MIL/uL — ABNORMAL LOW (ref 3.87–5.11)
RDW: 16.5 % — ABNORMAL HIGH (ref 11.5–15.5)
WBC: 10.4 10*3/uL (ref 4.0–10.5)
nRBC: 0.2 % (ref 0.0–0.2)

## 2021-01-04 LAB — TYPE AND SCREEN
ABO/RH(D): O POS
Antibody Screen: POSITIVE

## 2021-01-04 LAB — MAGNESIUM
Magnesium: 2.4 mg/dL (ref 1.7–2.4)
Magnesium: 2.4 mg/dL (ref 1.7–2.4)

## 2021-01-04 LAB — PREALBUMIN: Prealbumin: 18.8 mg/dL (ref 18–38)

## 2021-01-04 LAB — POTASSIUM: Potassium: 4 mmol/L (ref 3.5–5.1)

## 2021-01-04 MED ORDER — POTASSIUM CHLORIDE CRYS ER 20 MEQ PO TBCR
40.0000 meq | EXTENDED_RELEASE_TABLET | Freq: Two times a day (BID) | ORAL | Status: AC
  Administered 2021-01-04 (×2): 40 meq via ORAL
  Filled 2021-01-04 (×3): qty 2

## 2021-01-04 MED ORDER — POTASSIUM CHLORIDE CRYS ER 20 MEQ PO TBCR: 40.0000 meq | EXTENDED_RELEASE_TABLET | Freq: Once | ORAL | Status: DC

## 2021-01-04 MED ORDER — CEFAZOLIN SODIUM-DEXTROSE 2-4 GM/100ML-% IV SOLN
2.0000 g | Freq: Two times a day (BID) | INTRAVENOUS | Status: DC
  Administered 2021-01-04 – 2021-01-05 (×3): 2 g via INTRAVENOUS
  Filled 2021-01-04 (×4): qty 100

## 2021-01-04 MED ORDER — FUROSEMIDE 10 MG/ML IJ SOLN
40.0000 mg | Freq: Every day | INTRAMUSCULAR | Status: DC
  Administered 2021-01-05: 40 mg via INTRAVENOUS
  Filled 2021-01-04: qty 4

## 2021-01-04 NOTE — NC FL2 (Signed)
[place] LEVEL OF CARE SCREENING TOOL     IDENTIFICATION  Patient Name: Carolyn Robertson Birthdate: [birth year] Sex: female Admission Date (Current Location): 01/01/2021  [facility] and [place] Number:  Herbalist and Address:  [facility],  [place], [place]      Provider Number: [id number]  Attending Physician Name and Address:  Alma Friendly, MD  Relative Name and Phone Number:       Current Level of Care: Hospital Recommended Level of Care: [place] Prior Approval Number:    Date Approved/Denied:   PASRR Number: [id number]  Discharge Plan: SNF    Current Diagnoses: Patient Active Problem List   Diagnosis Date Noted  . Cellulitis 01/01/2021  . Chronic deep vein thrombosis (DVT) ([place]) 01/01/2021  . COVID-19 virus infection 11/06/2020  . UTI (urinary tract infection) 11/05/2020  . Precordial chest pain 04/14/2020  . Tachycardia 04/14/2020  . Educated about COVID-19 virus infection 04/14/2020  . Palliative care by specialist   . Generalized weakness 02/16/2020  . Hypothyroidism 02/16/2020  . Acute cystitis 02/16/2020  . Elevated BP without diagnosis of hypertension 02/16/2020  . Spondylolisthesis, lumbar region 02/20/2019  . TIA (transient ischemic attack) 06/25/2018  . CVA (cerebrovascular accident) ([place]) 06/24/2018  . Lumbar herniated disc 04/12/2018  . Stage 4 chronic kidney disease ([place]) 02/19/2018  . Chronic venous insufficiency 02/19/2018  . Primary localized osteoarthritis of right knee 11/14/2016    Orientation RESPIRATION BLADDER Height & Weight     Self,Time,Situation,Place  Normal Incontinent Weight: 73.3 kg Height:  5\' 4"  (162.6 cm)  BEHAVIORAL SYMPTOMS/MOOD NEUROLOGICAL BOWEL NUTRITION STATUS      Continent Diet (2 gm sodium)  AMBULATORY STATUS COMMUNICATION OF NEEDS Skin   Extensive Assist Verbally Other (Comment) (R arm cellulitis/edema)                        Personal Care Assistance Level of Assistance  Bathing,Dressing,Feeding Bathing Assistance: Limited assistance Feeding assistance: Independent Dressing Assistance: Limited assistance     Functional Limitations Info  Sight,Hearing Sight Info: Adequate Hearing Info: Adequate      SPECIAL CARE FACTORS FREQUENCY  PT (By licensed PT),OT (By licensed OT)     PT Frequency: 5 x weekly OT Frequency: 5 x weekly            Contractures Contractures Info: Not present    Additional Factors Info  Code Status,Allergies Code Status Info: Full Allergies Info: Penicillins, Sulfa Antibiotics, Codeine, Hydrocodone-acetaminophen, Naproxen Sodium, Tape           Current Medications (01/04/2021):  This is the current hospital active medication list Current Facility-Administered Medications  Medication Dose Route Frequency Provider Last Rate Last Admin  . acetaminophen (TYLENOL) tablet 650 mg  650 mg Oral Q6H PRN Doran Heater, DO   650 mg at 01/01/21 2243   Or  . acetaminophen (TYLENOL) suppository 650 mg  650 mg Rectal Q6H PRN Doran Heater, DO      . apixaban (ELIQUIS) tablet 5 mg  5 mg Oral BID Doran Heater, DO   5 mg at 01/04/21 0853  . ceFAZolin (ANCEF) IVPB 2g/100 mL premix  2 g Intravenous Q12H Alma Friendly, MD 200 mL/hr at 01/04/21 1353 2 g at 01/04/21 1353  . Chlorhexidine Gluconate Cloth 2 % PADS 6 each  6 each Topical Q0600 Antonieta Pert, MD   6 each at 01/04/21 [phone]  . DULoxetine (CYMBALTA) DR  capsule 60 mg  60 mg Oral QHS Doran Heater, DO   60 mg at 01/03/21 2146  . estrogens (conjugated) (PREMARIN) tablet 0.625 mg  0.625 mg Oral Daily Doran Heater, DO   0.625 mg at 01/04/21 8616   And  . medroxyPROGESTERone (PROVERA) tablet 2.5 mg  2.5 mg Oral Daily Doran Heater, DO   2.5 mg at 01/04/21 0841  . febuxostat (ULORIC) tablet 40 mg  40 mg Oral QHS Antonieta Pert, MD   40 mg at 01/03/21 2145  . [START ON 01/05/2021] furosemide (LASIX) injection  40 mg  40 mg Intravenous Daily Alma Friendly, MD      . gabapentin (NEURONTIN) capsule 100 mg  100 mg Oral Daily Doran Heater, DO   100 mg at 01/04/21 8372  . hydrALAZINE (APRESOLINE) tablet 25 mg  25 mg Oral BID Alma Friendly, MD   25 mg at 01/04/21 9021  . HYDROcodone-acetaminophen (NORCO/VICODIN) 5-325 MG per tablet 1 tablet  1 tablet Oral Q12H PRN Doran Heater, DO   1 tablet at 01/04/21 1008  . levothyroxine (SYNTHROID) tablet 175 mcg  175 mcg Oral Once per day on Sun Tue Thu Sat Doran Heater, DO   175 mcg at 01/03/21 1155  . levothyroxine (SYNTHROID) tablet 200 mcg  200 mcg Oral Q M,W,F Antonieta Pert, MD   200 mcg at 01/04/21 0604  . mirtazapine (REMERON) tablet 7.5 mg  7.5 mg Oral QHS MacNeil, Richard G, DO   7.5 mg at 01/03/21 2146  . mupirocin ointment (BACTROBAN) 2 % 1 application  1 application Nasal BID Antonieta Pert, MD   1 application at 20/80/22 0841  . ondansetron (ZOFRAN) tablet 4 mg  4 mg Oral Q6H PRN Doran Heater, DO       Or  . ondansetron (ZOFRAN) injection 4 mg  4 mg Intravenous Q6H PRN MacNeil, Richard G, DO      . pantoprazole (PROTONIX) EC tablet 40 mg  40 mg Oral QAC breakfast Doran Heater, DO   40 mg at 01/04/21 0841  . polyethylene glycol (MIRALAX / GLYCOLAX) packet 17 g  17 g Oral Daily PRN MacNeil, Richard G, DO      . potassium chloride SA (KLOR-CON) CR tablet 40 mEq  40 mEq Oral BID Alma Friendly, MD   40 mEq at 01/04/21 1100  . predniSONE (DELTASONE) tablet 10 mg  10 mg Oral Q breakfast Doran Heater, DO   10 mg at 01/04/21 0841  . vitamin B-12 (CYANOCOBALAMIN) tablet 1,000 mcg  1,000 mcg Oral Daily Alma Friendly, MD   1,000 mcg at 01/04/21 3361     Discharge Medications: Please see discharge summary for a list of discharge medications.  Relevant Imaging Results:  Relevant Lab Results:   Additional Information SSN [id number]  CLEMENTS, Marjie Skiff, RN

## 2021-01-04 NOTE — Progress Notes (Signed)
PROGRESS NOTE    Carolyn Robertson  ZCH:[id number] DOB: 09/28/1946 DOA: 01/01/2021 PCP: Burnard Bunting, MD   Chief Complaint  Patient presents with  . Abnormal Lab   Brief Narrative: 75 year old female with CKD stage IV, CVA, asthma, hypothyroidism, hypertension, sent from [facility] skilled nursing facility showing anemia.  Patient was seen in the ED her hemoglobin was stable but found to have generalized anasarca swelling with right upper extremity cellulitis. Patient has been residing at the rehab facilities for the last 2 months.  She has not been ambulatory for more than a week.  Routine labs showed hemoglobin 7.1 at the facility so sent to the ED. Patient also reports on and off swelling.  She was recently started on doxycycline for cellulitis. In the ED, VSS. Labs showed BUN 57, creatinine 2.1, WBC 15.1, UA showed large leukocytes, many bacteria, WBC greater than 50. Chest x-ray grossly unremarkable.  CT abdomen pelvis showed trace bilateral pleural effusions, vague groundglass opacities in the lower lungs, otherwise no acute anomaly seen. In the ED, patient received Rocephin, Tylenol, dose of vancomycin, and the hospitalist service was contacted for further evaluation and management.    Subjective: Patient seen and examined at bedside.  Right upper extremity swelling and erythema has improved, BLE edema has also improved.  Denies any new complaints.  Noted to be having intermittent nonsustained SVTs  Assessment & Plan:  Right arm cellulitis in the setting of significant edema Currently afebrile, with resolving leukocytosis BC, no growth to date S/P vancomycin, ceftriaxone--> IV cefazolin Continue to keep arm elevated Continue with diuresis Wound care consulted, appreciate recs  Anasarca/fluid overload with diffuse generalized edema ?hx of ?chronic diastolic HF Suspect due to her underlying CKD vs ?malnutrition (albumin low) Pre-albumin 18.8 Last echo done in 2019 showed EF of  65 to 28%, grade 1 diastolic dysfunction Repeat echo pending Continue IV Lasix Strict I's and O's, daily weights  CKD stage IV with fluid overload hypophosphatemia and metabolic acidosis Followed by Dr. Moshe Cipro but unable to see for last few months BUN/creatinine appears stable Continue diuresis as above  Hypertension BP uncontrolled Restart home hydralazine, continue to hold off amlodipine for now  Chronic right femoral DVT Continue Eliquis  Abnormal UA Urine culture grew multiple species, patient asymptomatic Continue antibiotics as above  Iron deficiency/anmeia of CKD Baseline hemoglobin around 11, no evidence of bleeding Iron panel shows iron 22, sats 5%, ferritin 18, vitamin N86-->767, folic acid WNL Gave 1 dose of IV Feraheme on 01/03/2021 Daily supplementation of vitamin B12 Monitor and transfuse if less than 7 g  Generalized weakness/deconditioning PT/OT  Hypothyroidism Continue home Synthroid  RA Vs Gout hx Continue chronic prednisone     Diet Order            Diet 2 gram sodium Room service appropriate? Yes; Fluid consistency: Thin  Diet effective now               Patient's Body mass index is 27.74 kg/m.  Pressure Injury 11/12/20 Buttocks Right Stage 2 -  Partial thickness loss of dermis presenting as a shallow open injury with a red, pink wound bed without slough. (Active)  11/12/20 0800  Location: Buttocks  Location Orientation: Right  Staging: Stage 2 -  Partial thickness loss of dermis presenting as a shallow open injury with a red, pink wound bed without slough.  Wound Description (Comments):   Present on Admission: Yes   DVT prophylaxis: SCDs Start: 01/01/21 2112 eliquis Code Status:   Code  Status: Full Code  Family Communication: Discussed with patient and husband at bedside on 01/04/2021    Status is: Inpatient Remains inpatient appropriate because:IV treatments appropriate due to intensity of illness or inability to take PO and  Inpatient level of care appropriate due to severity of illness  Dispo: The patient is from: SNF              Anticipated d/c is to: SNF              Patient currently is not medically stable to d/c.   Difficult to place patient No       Unresulted Labs (From admission, onward)          Start     Ordered   01/05/21 0500  Magnesium  Tomorrow morning,   R       Question:  Specimen collection method  Answer:  Lab=Lab collect   01/04/21 1516   01/04/21 1700  Potassium  Once,   R       Question:  Specimen collection method  Answer:  Lab=Lab collect   01/04/21 1515   01/04/21 1700  Magnesium  Once,   R       Question:  Specimen collection method  Answer:  Lab=Lab collect   01/04/21 1515   01/04/21 1519  SARS CORONAVIRUS 2 (TAT 6-24 HRS) Nasopharyngeal Nasopharyngeal Swab  ONCE - STAT,   STAT       Question Answer Comment  Is this test for diagnosis or screening Screening   Symptomatic for COVID-19 as defined by CDC No   Hospitalized for COVID-19 No   Admitted to ICU for COVID-19 No   Previously tested for COVID-19 Yes   Resident in a congregate (group) care setting Yes   Employed in healthcare setting No   Pregnant No   Has patient completed COVID vaccination(s) (2 doses of Pfizer/Moderna 1 dose of [employer]) Unknown      01/04/21 1518   01/03/21 0500  Comprehensive metabolic panel  Daily,   R      01/02/21 1324   01/03/21 0500  CBC  Daily,   R      01/02/21 1324        Medications reviewed:  Scheduled Meds: . apixaban  5 mg Oral BID  . Chlorhexidine Gluconate Cloth  6 each Topical Q0600  . DULoxetine  60 mg Oral QHS  . estrogens (conjugated)  0.625 mg Oral Daily   And  . medroxyPROGESTERone  2.5 mg Oral Daily  . febuxostat  40 mg Oral QHS  . [START ON 01/05/2021] furosemide  40 mg Intravenous Daily  . gabapentin  100 mg Oral Daily  . hydrALAZINE  25 mg Oral BID  . levothyroxine  175 mcg Oral Once per day on Sun Tue Thu Sat  . levothyroxine  200 mcg Oral Q  M,W,F  . mirtazapine  7.5 mg Oral QHS  . mupirocin ointment  1 application Nasal BID  . pantoprazole  40 mg Oral QAC breakfast  . potassium chloride  40 mEq Oral BID  . potassium chloride  40 mEq Oral Once  . predniSONE  10 mg Oral Q breakfast  . vitamin B-12  1,000 mcg Oral Daily   Continuous Infusions: .  ceFAZolin (ANCEF) IV 2 g (01/04/21 1353)    Consultants:see note  Procedures:see note  Antimicrobials: Anti-infectives (From admission, onward)   Start     Dose/Rate Route Frequency Ordered Stop   01/04/21 1300  ceFAZolin (  ANCEF) IVPB 2g/100 mL premix        2 g 200 mL/hr over 30 Minutes Intravenous Every 12 hours 01/04/21 1026     01/02/21 2200  vancomycin (VANCOCIN) IVPB 1000 mg/200 mL premix  Status:  Discontinued        1,000 mg 200 mL/hr over 60 Minutes Intravenous Every 48 hours 01/01/21 2136 01/02/21 1735   01/02/21 2200  vancomycin (VANCOREADY) IVPB 1000 mg/200 mL  Status:  Discontinued        1,000 mg 200 mL/hr over 60 Minutes Intravenous Every 48 hours 01/02/21 1735 01/04/21 1026   01/02/21 1800  cefTRIAXone (ROCEPHIN) 1 g in sodium chloride 0.9 % 100 mL IVPB  Status:  Discontinued        1 g 200 mL/hr over 30 Minutes Intravenous Every 24 hours 01/01/21 2115 01/04/21 1026   01/01/21 1930  vancomycin (VANCOCIN) IVPB 1000 mg/200 mL premix        1,000 mg 200 mL/hr over 60 Minutes Intravenous  Once 01/01/21 1927 01/01/21 2122   01/01/21 1830  cefTRIAXone (ROCEPHIN) 1 g in sodium chloride 0.9 % 100 mL IVPB        1 g 200 mL/hr over 30 Minutes Intravenous  Once 01/01/21 1820 01/01/21 1927     Culture/Microbiology    Component Value Date/Time   SDES  01/01/2021 1927    IN/OUT CATH URINE Performed at [facility], [place] [address], [place], [place] [address]    SPECREQUEST  01/01/2021 1927    NONE Performed at [facility], [place] [address] [place], [place] [address]    CULT MULTIPLE SPECIES PRESENT, SUGGEST RECOLLECTION (A)  01/01/2021 1927   REPTSTATUS 01/03/2021 FINAL 01/01/2021 1927    Other culture-see note  Objective: Vitals: Today's Vitals   01/03/21 2156 01/03/21 2231 01/04/21 0534 01/04/21 1053  BP:   124/82   Pulse:   70   Resp:   16   Temp:   98 F (36.7 C)   TempSrc:   Oral   SpO2:   96%   Weight:      Height:      PainSc: 2  Asleep  6     Intake/Output Summary (Last 24 hours) at 01/04/2021 1519 Last data filed at 01/04/2021 1500 Gross per 24 hour  Intake 818 ml  Output 2975 ml  Net -2157 ml   Filed Weights   01/01/21 2023 01/01/21 2205  Weight: 63.5 kg 73.3 kg   Weight change:   Intake/Output from previous day: 03/20 0701 - 03/21 0700 In: 820 [P.O.:720; IV Piggyback:100] Out: 2300 [Urine:2300] Intake/Output this shift: Total I/O In: 358 [P.O.:358] Out: 675 [Urine:675] Filed Weights   01/01/21 2023 01/01/21 2205  Weight: 63.5 kg 73.3 kg    Examination:  General: NAD, deconditioned, weak  Cardiovascular: S1, S2 present  Respiratory:  Diminished breath sounds bilaterally  Abdomen: Soft, nontender, nondistended, bowel sounds present  Musculoskeletal: Bilateral upper extremity swelling, noted multiple skin bruises, +1 bilateral lower extremity edema  Skin:  Noted multiple skin bruises  Psychiatry: Normal mood   Data Reviewed: I have personally reviewed following labs and imaging studies CBC: Recent Labs  Lab 01/01/21 1733 01/02/21 0541 01/03/21 0531 01/04/21 0731  WBC 15.1* 13.5* 13.4* 10.4  NEUTROABS 14.0*  --   --   --   HGB 8.7* 8.0* 8.1* 7.6*  HCT 28.3* 28.1* 28.0* 26.6*  MCV 87.1 88.9 88.9 88.1  PLT 366 327 342 614   Basic Metabolic  Panel: Recent Labs  Lab 01/01/21 1733 01/01/21 2130 01/02/21 0541 01/03/21 0531 01/04/21 0731  NA 140  --  137 137 137  K 5.0  --  4.5 3.7 2.8*  CL 108  --  106 104 104  CO2 19*  --  20* 19* 22  GLUCOSE 119*  --  86 109* 86  BUN 57*  --  56* 57* 57*  CREATININE 2.11*  --  2.10* 2.10* 2.08*  CALCIUM 8.5*  --   8.4* 8.1* 7.9*  MG  --  2.8*  --   --  2.4  PHOS  --  4.9*  --   --   --    GFR: Estimated Creatinine Clearance: 22.6 mL/min (A) (by C-G formula based on SCr of 2.08 mg/dL (H)). Liver Function Tests: Recent Labs  Lab 01/01/21 1733 01/02/21 0541 01/03/21 0531 01/04/21 0731  AST 32 29 30 25   ALT 31 29 30 26   ALKPHOS 129* 118 115 110  BILITOT 0.3 0.9 0.7 1.1  PROT 6.2* 5.8* 5.9* 5.4*  ALBUMIN 3.1* 3.1* 3.0* 2.9*   No results for input(s): LIPASE, AMYLASE in the last 168 hours. No results for input(s): AMMONIA in the last 168 hours. Coagulation Profile: Recent Labs  Lab 01/01/21 1733  INR 2.3*   Cardiac Enzymes: No results for input(s): CKTOTAL, CKMB, CKMBINDEX, TROPONINI in the last 168 hours. BNP (last 3 results) No results for input(s): PROBNP in the last 8760 hours. HbA1C: No results for input(s): HGBA1C in the last 72 hours. CBG: No results for input(s): GLUCAP in the last 168 hours. Lipid Profile: No results for input(s): CHOL, HDL, LDLCALC, TRIG, CHOLHDL, LDLDIRECT in the last 72 hours. Thyroid Function Tests: Recent Labs    01/01/21 1735 01/01/21 2120  TSH  --  1.622  FREET4 1.20*  --    Anemia Panel: Recent Labs    01/02/21 0534 01/02/21 0541  VITAMINB12 200  --   FOLATE  --  13.6  FERRITIN  --  18  TIBC  --  417  IRON  --  22*   Sepsis Labs: Recent Labs  Lab 01/01/21 1650  LATICACIDVEN 1.1    Recent Results (from the past 240 hour(s))  Blood culture (routine single)     Status: None (Preliminary result)   Collection Time: 01/01/21  4:50 PM   Specimen: BLOOD  Result Value Ref Range Status   Specimen Description   Final    BLOOD LEFT ANTECUBITAL Performed at [facility], [place] [address] [place], [place] [address]    Special Requests   Final    BOTTLES DRAWN AEROBIC AND ANAEROBIC Blood Culture adequate volume Performed at [place] [address] [place], [place] [address]    Culture   Final     NO GROWTH 3 DAYS Performed at [facility], [place] [address], [place], [place] [address]    Report Status PENDING  Incomplete  Urine culture     Status: Abnormal   Collection Time: 01/01/21  7:27 PM   Specimen: In/Out Cath Urine  Result Value Ref Range Status   Specimen Description   Final    IN/OUT CATH URINE Performed at [place] [address] [place], [place] [address]    Special Requests   Final    NONE Performed at [facility], [place] [address] [place],  [address]    Culture MULTIPLE SPECIES PRESENT, SUGGEST RECOLLECTION (A)  Final  Report Status 01/03/2021 FINAL  Final  SARS CORONAVIRUS 2 (TAT 6-24 HRS) Nasopharyngeal Nasopharyngeal Swab     Status: None   Collection Time: 01/01/21  9:20 PM   Specimen: Nasopharyngeal Swab  Result Value Ref Range Status   SARS Coronavirus 2 NEGATIVE NEGATIVE Final    Comment: (NOTE) SARS-CoV-2 target nucleic acids are NOT DETECTED.  The SARS-CoV-2 RNA is generally detectable in upper and lower respiratory specimens during the acute phase of infection. Negative results do not preclude SARS-CoV-2 infection, do not rule out co-infections with other pathogens, and should not be used as the sole basis for treatment or other patient management decisions. Negative results must be combined with clinical observations, patient history, and epidemiological information. The expected result is Negative.  Fact Sheet for Patients: SugarRoll.be  Fact Sheet for Healthcare Providers: https://www.woods-mathews.com/  This test is not yet approved or cleared by the [place] FDA and  has been authorized for detection and/or diagnosis of SARS-CoV-2 by FDA under an Emergency Use Authorization (EUA). This EUA will remain  in effect (meaning this test can be used) for the duration of the COVID-19 declaration under Se ction 564(b)(1) of the Act, 21  U.S.C. section 360bbb-3(b)(1), unless the authorization is terminated or revoked sooner.  Performed at [facility], [place] [address] [place], [place] [address]   MRSA PCR Screening     Status: Abnormal   Collection Time: 01/02/21  5:52 PM   Specimen: Nasopharyngeal  Result Value Ref Range Status   MRSA by PCR POSITIVE (A) NEGATIVE Final    Comment:        The GeneXpert MRSA Assay (FDA approved for NASAL specimens only), is one component of a comprehensive MRSA colonization surveillance program. It is not intended to diagnose MRSA infection nor to guide or monitor treatment for MRSA infections. RESULT CALLED TO, READ BACK BY AND VERIFIED WITH: Vance Gather RN 01/02/21 @1906  BY P.HENDERSON Performed at [facility], [place] [address] [place], [place] [address]      Radiology Studies: No results found.   LOS: 3 days   Alma Friendly, MD [facility]  01/04/2021, 3:19 PM

## 2021-01-04 NOTE — Evaluation (Addendum)
Physical Therapy Evaluation Patient Details Name: Carolyn Robertson MRN: [id number] DOB: 1946-01-09 Today's Date: 01/04/2021   History of Present Illness  75 year old female with CKD stage IV, CVA, asthma, hypothyroidism, hypertension, sent from [facility] skilled nursing facility showing anemia,  generalized anasarca swelling with right upper extremity cellulitis, bilateral Leg edema. Patient has been residing at the rehab facilities for the last 2 months.  Clinical Impression  The patient comes from SNF x ~ 2 months duration. Patient presents with bilateral ankle plantarflexion contractures. Patient and spouse report patient has not ambulated  Since going to facility. Patient reports OOB via mechanical lift.  Patient did sit on bed edge x 12 minutes, supported self and tolerated well.  Plans are to return to SNF for continued rehab.  Pt admitted with above diagnosis.  Pt currently with functional limitations due to the deficits listed below (see PT Problem List). Pt will benefit from skilled PT to increase their independence and safety with mobility to allow discharge to the venue listed below.       Follow Up Recommendations SNF (return to SNF)    Equipment Recommendations  None recommended by PT    Recommendations for Other Services       Precautions / Restrictions Precautions Precautions: Fall Precaution Comments: both ankles contracted in plantarflexion.      Mobility  Bed Mobility Overal bed mobility: Needs Assistance Bed Mobility: Rolling;Sit to Supine Rolling: Max assist;+2 for physical assistance;+2 for safety/equipment   Supine to sit: +2 for physical assistance;+2 for safety/equipment;Total assist Sit to supine: +2 for physical assistance;+2 for safety/equipment;Total assist   General bed mobility comments: patient requires 2 max assistnace for rolling, Patient assisted moving legs toward bed edge, required use of bed pad to slide pt. around and sit upright. 2 total  asist to return to supine,    Transfers                 General transfer comment: will need a lift  Ambulation/Gait                Stairs            Wheelchair Mobility    Modified Rankin (Stroke Patients Only)       Balance Overall balance assessment: Needs assistance Sitting-balance support: Feet supported;No upper extremity supported Sitting balance-Leahy Scale: Fair Sitting balance - Comments: sits at midline x 12 minutes, able to lean forward and sit upright, back pain limiting       Standing balance comment: NT                             Pertinent Vitals/Pain Pain Assessment: Faces Pain Score: 7  Pain Location: back where Plates are. Pain Descriptors / Indicators: Aching Pain Intervention(s): Monitored during session;Premedicated before session;Repositioned    Home Living Family/patient expects to be discharged to:: Skilled nursing facility                 Additional Comments: has walked a few steps in parallel bars x 1, OOB via lift but rerely per pt and spouse.    Prior Function                 Hand Dominance   Dominant Hand: Right    Extremity/Trunk Assessment   Upper Extremity Assessment Upper Extremity Assessment:  (lacks shoulder flexion, able to use hands)    Lower Extremity Assessment Lower Extremity Assessment: RLE deficits/detail;LLE deficits/detail RLE  Deficits / Details: Plantarflexion contractures, unable to get even close to neutral., knee ext 2+, hip flex 2+ LLE Deficits / Details: similar to right leg    Cervical / Trunk Assessment Cervical / Trunk Assessment: Kyphotic  Communication   Communication: No difficulties  Cognition Arousal/Alertness: Awake/alert Behavior During Therapy: WFL for tasks assessed/performed Overall Cognitive Status: History of cognitive impairments - at baseline                                 General Comments: follows comands, knows she is in  hospital and why.      General Comments      Exercises General Exercises - Lower Extremity Ankle Circles/Pumps: AROM;PROM;20 reps;Both;Supine Long Arc Quad: AROM;Both;10 reps;Seated Hip Flexion/Marching: AAROM;Seated;Both;10 reps   Assessment/Plan    PT Assessment Patient needs continued PT services  PT Problem List Decreased strength;Decreased mobility;Decreased safety awareness;Decreased range of motion;Decreased knowledge of precautions;Decreased activity tolerance;Decreased balance;Decreased knowledge of use of DME;Pain;Decreased skin integrity       PT Treatment Interventions Therapeutic activities;Therapeutic exercise;Patient/family education;Balance training;Functional mobility training    PT Goals (Current goals can be found in the Care Plan section)  Acute Rehab PT Goals Patient Stated Goal: to walk PT Goal Formulation: With patient/family Time For Goal Achievement: 01/18/21 Potential to Achieve Goals: Fair    Frequency Min 2X/week   Barriers to discharge        Co-evaluation               AM-PAC PT "6 Clicks" Mobility  Outcome Measure Help needed turning from your back to your side while in a flat bed without using bedrails?: Total Help needed moving from lying on your back to sitting on the side of a flat bed without using bedrails?: Total Help needed moving to and from a bed to a chair (including a wheelchair)?: Total Help needed standing up from a chair using your arms (e.g., wheelchair or bedside chair)?: Total Help needed to walk in hospital room?: Total Help needed climbing 3-5 steps with a railing? : Total 6 Click Score: 6    End of Session   Activity Tolerance: Patient tolerated treatment well Patient left: in bed;with call bell/phone within reach;with bed alarm set Nurse Communication: Mobility status;Need for lift equipment PT Visit Diagnosis: Muscle weakness (generalized) (M62.81)    Time: 9211-9417 PT Time Calculation (min) (ACUTE  ONLY): 27 min   Charges:   PT Evaluation $PT Eval Low Complexity: 1 Low PT Treatments $Therapeutic Activity: 8-22 mins        Tresa Endo PT Acute Rehabilitation Services Pager [phone] Office [phone]   Claretha Cooper 01/04/2021, 12:23 PM

## 2021-01-04 NOTE — TOC Initial Note (Signed)
Transition of Care [facility]) - Initial/Assessment Note    Patient Details  Name: Carolyn Robertson MRN: [id number] Date of Birth: 1946/08/27  Transition of Care [facility]) CM/SW Contact:    Graceanne Guin, Marjie Skiff, RN Phone Number: 01/04/2021, 3:17 PM  Clinical Narrative:                 Pt from [facility] for rehab and she plans to go back there at dc. Per [place] liaison she can come back there when ready. MD made aware that new Covid test needed for return for facility. FL2 completed.   Expected Discharge Plan: Skilled Nursing Facility Barriers to Discharge: Continued Medical Work up   Patient Goals and CMS Choice Patient states their goals for this hospitalization and ongoing recovery are:: To get better      Expected Discharge Plan and Services Expected Discharge Plan: [place]   Discharge Planning Services: CM Consult   Living arrangements for the past 2 months: [place]                                      Prior Living Arrangements/Services Living arrangements for the past 2 months: [place] Lives with:: Facility Resident          Need for Family Participation in Patient Care: Yes (Comment) Care giver support system in place?: Yes (comment)   Criminal Activity/Legal Involvement Pertinent to Current Situation/Hospitalization: No - Comment as needed  Activities of Daily Living Home Assistive Devices/Equipment: Wheelchair ADL Screening (condition at time of admission) Patient's cognitive ability adequate to safely complete daily activities?: No Is the patient deaf or have difficulty hearing?: No Does the patient have difficulty seeing, even when wearing glasses/contacts?: No Does the patient have difficulty concentrating, remembering, or making decisions?: Yes Patient able to express need for assistance with ADLs?: Yes Does the patient have difficulty dressing or bathing?: Yes Independently performs ADLs?:  No Communication: Independent Dressing (OT): Needs assistance Is this a change from baseline?: Pre-admission baseline Grooming: Needs assistance Is this a change from baseline?: Pre-admission baseline Feeding: Needs assistance Is this a change from baseline?: Pre-admission baseline Bathing: Needs assistance Is this a change from baseline?: Pre-admission baseline Toileting: Needs assistance Is this a change from baseline?: Pre-admission baseline In/Out Bed: Needs assistance Is this a change from baseline?: Pre-admission baseline Walks in Home: Dependent Is this a change from baseline?: Pre-admission baseline Does the patient have difficulty walking or climbing stairs?: Yes Weakness of Legs: Both Weakness of Arms/Hands: Both  Permission Sought/Granted                  Emotional Assessment Appearance:: Appears stated age Attitude/Demeanor/Rapport: Gracious Affect (typically observed): Appropriate Orientation: : Oriented to Self,Oriented to Place,Oriented to  Time,Oriented to Situation Alcohol / Substance Use: Not Applicable    Admission diagnosis:  Cellulitis [L03.90] Cellulitis of upper extremity, unspecified laterality [L03.119] Patient Active Problem List   Diagnosis Date Noted  . Cellulitis 01/01/2021  . Chronic deep vein thrombosis (DVT) ([place]) 01/01/2021  . COVID-19 virus infection 11/06/2020  . UTI (urinary tract infection) 11/05/2020  . Precordial chest pain 04/14/2020  . Tachycardia 04/14/2020  . Educated about COVID-19 virus infection 04/14/2020  . Palliative care by specialist   . Generalized weakness 02/16/2020  . Hypothyroidism 02/16/2020  . Acute cystitis 02/16/2020  . Elevated BP without diagnosis of hypertension 02/16/2020  . Spondylolisthesis, lumbar region 02/20/2019  .  TIA (transient ischemic attack) 06/25/2018  . CVA (cerebrovascular accident) ([place]) 06/24/2018    Class: Acute  . Lumbar herniated disc 04/12/2018  . Stage 4 chronic kidney disease  ([place]) 02/19/2018  . Chronic venous insufficiency 02/19/2018  . Primary localized osteoarthritis of right knee 11/14/2016   PCP:  Burnard Bunting, MD Pharmacy:  No Pharmacies Listed    Social Determinants of Health (SDOH) Interventions    Readmission Risk Interventions No flowsheet data found.

## 2021-01-04 NOTE — Progress Notes (Signed)
[facility]) Hospital Liaison note:  This patient is currently enrolled in outpatient-based palliative care. Will continue to monitor for disposition.  Please call with any outpatient palliative care questions or concerns.  Thank you, Lorelee Market, LPN [facility] Liaison [phone]

## 2021-01-05 ENCOUNTER — Inpatient Hospital Stay (HOSPITAL_COMMUNITY): Payer: Medicare Other

## 2021-01-05 DIAGNOSIS — L03113 Cellulitis of right upper limb: Secondary | ICD-10-CM | POA: Diagnosis not present

## 2021-01-05 DIAGNOSIS — F329 Major depressive disorder, single episode, unspecified: Secondary | ICD-10-CM | POA: Diagnosis not present

## 2021-01-05 DIAGNOSIS — F419 Anxiety disorder, unspecified: Secondary | ICD-10-CM | POA: Diagnosis not present

## 2021-01-05 DIAGNOSIS — I82513 Chronic embolism and thrombosis of femoral vein, bilateral: Secondary | ICD-10-CM | POA: Diagnosis not present

## 2021-01-05 DIAGNOSIS — J45909 Unspecified asthma, uncomplicated: Secondary | ICD-10-CM | POA: Diagnosis not present

## 2021-01-05 DIAGNOSIS — M1991 Primary osteoarthritis, unspecified site: Secondary | ICD-10-CM | POA: Diagnosis not present

## 2021-01-05 DIAGNOSIS — R2689 Other abnormalities of gait and mobility: Secondary | ICD-10-CM | POA: Diagnosis not present

## 2021-01-05 DIAGNOSIS — M6259 Muscle wasting and atrophy, not elsewhere classified, multiple sites: Secondary | ICD-10-CM | POA: Diagnosis not present

## 2021-01-05 DIAGNOSIS — M109 Gout, unspecified: Secondary | ICD-10-CM | POA: Diagnosis not present

## 2021-01-05 DIAGNOSIS — F418 Other specified anxiety disorders: Secondary | ICD-10-CM | POA: Diagnosis not present

## 2021-01-05 DIAGNOSIS — G609 Hereditary and idiopathic neuropathy, unspecified: Secondary | ICD-10-CM | POA: Diagnosis not present

## 2021-01-05 DIAGNOSIS — Z515 Encounter for palliative care: Secondary | ICD-10-CM | POA: Diagnosis not present

## 2021-01-05 DIAGNOSIS — I129 Hypertensive chronic kidney disease with stage 1 through stage 4 chronic kidney disease, or unspecified chronic kidney disease: Secondary | ICD-10-CM | POA: Diagnosis not present

## 2021-01-05 DIAGNOSIS — Z7901 Long term (current) use of anticoagulants: Secondary | ICD-10-CM | POA: Diagnosis not present

## 2021-01-05 DIAGNOSIS — E039 Hypothyroidism, unspecified: Secondary | ICD-10-CM | POA: Diagnosis not present

## 2021-01-05 DIAGNOSIS — G9009 Other idiopathic peripheral autonomic neuropathy: Secondary | ICD-10-CM | POA: Diagnosis not present

## 2021-01-05 DIAGNOSIS — L97819 Non-pressure chronic ulcer of other part of right lower leg with unspecified severity: Secondary | ICD-10-CM | POA: Diagnosis not present

## 2021-01-05 DIAGNOSIS — I639 Cerebral infarction, unspecified: Secondary | ICD-10-CM | POA: Diagnosis not present

## 2021-01-05 DIAGNOSIS — E273 Drug-induced adrenocortical insufficiency: Secondary | ICD-10-CM | POA: Diagnosis not present

## 2021-01-05 DIAGNOSIS — I5031 Acute diastolic (congestive) heart failure: Secondary | ICD-10-CM

## 2021-01-05 DIAGNOSIS — R627 Adult failure to thrive: Secondary | ICD-10-CM | POA: Diagnosis not present

## 2021-01-05 DIAGNOSIS — U071 COVID-19: Secondary | ICD-10-CM | POA: Diagnosis not present

## 2021-01-05 DIAGNOSIS — Z7401 Bed confinement status: Secondary | ICD-10-CM | POA: Diagnosis not present

## 2021-01-05 DIAGNOSIS — R609 Edema, unspecified: Secondary | ICD-10-CM | POA: Diagnosis not present

## 2021-01-05 DIAGNOSIS — M069 Rheumatoid arthritis, unspecified: Secondary | ICD-10-CM | POA: Diagnosis not present

## 2021-01-05 DIAGNOSIS — M2550 Pain in unspecified joint: Secondary | ICD-10-CM | POA: Diagnosis not present

## 2021-01-05 DIAGNOSIS — N959 Unspecified menopausal and perimenopausal disorder: Secondary | ICD-10-CM | POA: Diagnosis not present

## 2021-01-05 DIAGNOSIS — G459 Transient cerebral ischemic attack, unspecified: Secondary | ICD-10-CM | POA: Diagnosis not present

## 2021-01-05 DIAGNOSIS — R41841 Cognitive communication deficit: Secondary | ICD-10-CM | POA: Diagnosis not present

## 2021-01-05 DIAGNOSIS — R5381 Other malaise: Secondary | ICD-10-CM | POA: Diagnosis not present

## 2021-01-05 DIAGNOSIS — I5030 Unspecified diastolic (congestive) heart failure: Secondary | ICD-10-CM | POA: Diagnosis not present

## 2021-01-05 DIAGNOSIS — L03115 Cellulitis of right lower limb: Secondary | ICD-10-CM | POA: Diagnosis not present

## 2021-01-05 DIAGNOSIS — I10 Essential (primary) hypertension: Secondary | ICD-10-CM | POA: Diagnosis not present

## 2021-01-05 DIAGNOSIS — E538 Deficiency of other specified B group vitamins: Secondary | ICD-10-CM | POA: Diagnosis not present

## 2021-01-05 DIAGNOSIS — I82511 Chronic embolism and thrombosis of right femoral vein: Secondary | ICD-10-CM | POA: Diagnosis not present

## 2021-01-05 DIAGNOSIS — R531 Weakness: Secondary | ICD-10-CM | POA: Diagnosis not present

## 2021-01-05 DIAGNOSIS — R1312 Dysphagia, oropharyngeal phase: Secondary | ICD-10-CM | POA: Diagnosis not present

## 2021-01-05 DIAGNOSIS — N184 Chronic kidney disease, stage 4 (severe): Secondary | ICD-10-CM | POA: Diagnosis not present

## 2021-01-05 DIAGNOSIS — N049 Nephrotic syndrome with unspecified morphologic changes: Secondary | ICD-10-CM | POA: Diagnosis not present

## 2021-01-05 DIAGNOSIS — I82411 Acute embolism and thrombosis of right femoral vein: Secondary | ICD-10-CM | POA: Diagnosis not present

## 2021-01-05 DIAGNOSIS — M6281 Muscle weakness (generalized): Secondary | ICD-10-CM | POA: Diagnosis not present

## 2021-01-05 DIAGNOSIS — T380X5A Adverse effect of glucocorticoids and synthetic analogues, initial encounter: Secondary | ICD-10-CM | POA: Diagnosis not present

## 2021-01-05 DIAGNOSIS — G8929 Other chronic pain: Secondary | ICD-10-CM | POA: Diagnosis not present

## 2021-01-05 DIAGNOSIS — L03114 Cellulitis of left upper limb: Secondary | ICD-10-CM | POA: Diagnosis not present

## 2021-01-05 DIAGNOSIS — R54 Age-related physical debility: Secondary | ICD-10-CM | POA: Diagnosis not present

## 2021-01-05 DIAGNOSIS — D649 Anemia, unspecified: Secondary | ICD-10-CM | POA: Diagnosis not present

## 2021-01-05 DIAGNOSIS — F32A Depression, unspecified: Secondary | ICD-10-CM | POA: Diagnosis not present

## 2021-01-05 DIAGNOSIS — K219 Gastro-esophageal reflux disease without esophagitis: Secondary | ICD-10-CM | POA: Diagnosis not present

## 2021-01-05 DIAGNOSIS — M21921 Unspecified acquired deformity of right upper arm: Secondary | ICD-10-CM | POA: Diagnosis not present

## 2021-01-05 LAB — CBC
HCT: 28.1 % — ABNORMAL LOW (ref 36.0–46.0)
Hemoglobin: 8.2 g/dL — ABNORMAL LOW (ref 12.0–15.0)
MCH: 25.6 pg — ABNORMAL LOW (ref 26.0–34.0)
MCHC: 29.2 g/dL — ABNORMAL LOW (ref 30.0–36.0)
MCV: 87.8 fL (ref 80.0–100.0)
Platelets: 348 K/uL (ref 150–400)
RBC: 3.2 MIL/uL — ABNORMAL LOW (ref 3.87–5.11)
RDW: 16.8 % — ABNORMAL HIGH (ref 11.5–15.5)
WBC: 11.8 K/uL — ABNORMAL HIGH (ref 4.0–10.5)
nRBC: 0.4 % — ABNORMAL HIGH (ref 0.0–0.2)

## 2021-01-05 LAB — COMPREHENSIVE METABOLIC PANEL WITH GFR
ALT: 18 U/L (ref 0–44)
AST: 22 U/L (ref 15–41)
Albumin: 2.9 g/dL — ABNORMAL LOW (ref 3.5–5.0)
Alkaline Phosphatase: 112 U/L (ref 38–126)
Anion gap: 9 (ref 5–15)
BUN: 51 mg/dL — ABNORMAL HIGH (ref 8–23)
CO2: 23 mmol/L (ref 22–32)
Calcium: 8.3 mg/dL — ABNORMAL LOW (ref 8.9–10.3)
Chloride: 107 mmol/L (ref 98–111)
Creatinine, Ser: 2.08 mg/dL — ABNORMAL HIGH (ref 0.44–1.00)
GFR, Estimated: 24 mL/min — ABNORMAL LOW
Glucose, Bld: 87 mg/dL (ref 70–99)
Potassium: 3.8 mmol/L (ref 3.5–5.1)
Sodium: 139 mmol/L (ref 135–145)
Total Bilirubin: 0.9 mg/dL (ref 0.3–1.2)
Total Protein: 5.5 g/dL — ABNORMAL LOW (ref 6.5–8.1)

## 2021-01-05 LAB — ECHOCARDIOGRAM COMPLETE
Area-P 1/2: 3.46 cm2
Height: 64 in
S' Lateral: 1.8 cm
Weight: 2585.55 oz

## 2021-01-05 LAB — MAGNESIUM: Magnesium: 2.4 mg/dL (ref 1.7–2.4)

## 2021-01-05 LAB — SARS CORONAVIRUS 2 (TAT 6-24 HRS): SARS Coronavirus 2: NEGATIVE

## 2021-01-05 MED ORDER — CYANOCOBALAMIN 1000 MCG PO TABS: 1000.0000 ug | ORAL_TABLET | Freq: Every day | ORAL | Status: AC

## 2021-01-05 MED ORDER — CEPHALEXIN 500 MG PO CAPS: 500.0000 mg | ORAL_CAPSULE | Freq: Three times a day (TID) | ORAL | 0 refills | Status: AC

## 2021-01-05 MED ORDER — FUROSEMIDE 40 MG PO TABS
40.0000 mg | ORAL_TABLET | Freq: Every day | ORAL | Status: AC
Start: 2021-01-05 — End: ?

## 2021-01-05 NOTE — Progress Notes (Signed)
  Echocardiogram 2D Echocardiogram has been performed.  Carolyn Robertson 01/05/2021, 1:46 PM

## 2021-01-05 NOTE — Evaluation (Signed)
Occupational Therapy Evaluation Patient Details Name: Carolyn Robertson MRN: [id number] DOB: 02-09-1946 Today's Date: 01/05/2021    History of Present Illness 75 year old female with CKD stage IV, CVA, asthma, hypothyroidism, hypertension, hx COVID sent from [facility] skilled nursing facility showing anemia,  generalized anasarca swelling with right upper extremity cellulitis, bilateral Leg edema. Patient has been residing at the rehab facilities for the last 2 months.   Clinical Impression   Patient has been at [facility] rehab since [place], reports has not been getting out of bed much but when she does staff uses a mechanical lift. Patient reports has stood in parallel bars once as well. Currently patient with global weakness, swelling noted in L distal and bilateral proximal upper extremities "I looked like the michelin woman when I came in." Patient also limited by overall activity tolerance, able to sit up at EOB ~5 mins to perform ADL g/h needing min cues for posture as she reports pain in lower back. Recommend continued acute OT services to maximize patient strength, endurance, balance in order to reduce caregiver burden and facilitate D/C to venue listed below.    Follow Up Recommendations  SNF;Other (comment) (return to rehab)    Equipment Recommendations  None recommended by OT       Precautions / Restrictions Precautions Precautions: Fall Precaution Comments: both ankles contracted in plantar flexion. Restrictions Weight Bearing Restrictions: No      Mobility Bed Mobility Overal bed mobility: Needs Assistance Bed Mobility: Supine to Sit;Sit to Supine     Supine to sit: Max assist;HOB elevated Sit to supine: Max assist   General bed mobility comments: max A for LE management and to upright trunk. Patient is able to assist lifting buttock while OT use pad to slide up to [facility]. max A to lift LEs and position trunk to return supine    Transfers                 General  transfer comment: not tested, patient has been hoyerl lift at SNF    Balance Overall balance assessment: Needs assistance Sitting-balance support: Feet supported;No upper extremity supported Sitting balance-Leahy Scale: Fair Sitting balance - Comments: min cues to correct posterior lean Postural control: Posterior lean                                 ADL either performed or assessed with clinical judgement   ADL Overall ADL's : Needs assistance/impaired Eating/Feeding: Set up;Bed level   Grooming: Oral care;Wash/dry face;Supervision/safety;Set up;Sitting Grooming Details (indicate cue type and reason): needed min cues to correct posture, as patient fatigues note posterior lean and more rounded posture Upper Body Bathing: Minimal assistance;Bed level;Sitting   Lower Body Bathing: Maximal assistance;Sitting/lateral leans;Bed level   Upper Body Dressing : Minimal assistance;Sitting;Bed level   Lower Body Dressing: Total assistance;Sitting/lateral leans;Bed level     Toilet Transfer Details (indicate cue type and reason): patient has been mechanical lift at nursing home Toileting- Clothing Manipulation and Hygiene: Total assistance;Bed level       Functional mobility during ADLs: Total assistance General ADL Comments: patient reports she does not get up very often from bed from rehab and that therapy is supposed to work with her 5 days a week "they are really busy"                  Pertinent Vitals/Pain Pain Assessment: Faces Faces Pain Scale: Hurts little more Pain Location: back  where Plates are. Pain Descriptors / Indicators: Aching Pain Intervention(s): Monitored during session     Hand Dominance Right   Extremity/Trunk Assessment Upper Extremity Assessment Upper Extremity Assessment: Generalized weakness   Lower Extremity Assessment Lower Extremity Assessment: Defer to PT evaluation   Cervical / Trunk Assessment Cervical / Trunk Assessment:  Kyphotic   Communication Communication Communication: No difficulties   Cognition Arousal/Alertness: Awake/alert Behavior During Therapy: WFL for tasks assessed/performed Overall Cognitive Status: History of cognitive impairments - at baseline                                                Home Living Family/patient expects to be discharged to:: Skilled nursing facility                                 Additional Comments: has walked a few steps in parallel bars x 1, OOB via lift but rerely per pt and spouse.      Prior Functioning/Environment Level of Independence: Needs assistance        Comments: unsure however patient states when last home was getting up with either walker or w/c to use the bathroom        OT Problem List: Decreased strength;Decreased activity tolerance;Impaired balance (sitting and/or standing);Pain;Decreased safety awareness      OT Treatment/Interventions: Self-care/ADL training;Therapeutic exercise;Therapeutic activities;Patient/family education;Balance training    OT Goals(Current goals can be found in the care plan section) Acute Rehab OT Goals Patient Stated Goal: "I'm supposed to go back today." OT Goal Formulation: With patient Time For Goal Achievement: 01/19/21 Potential to Achieve Goals: Fair  OT Frequency: Min 2X/week    AM-PAC OT "6 Clicks" Daily Activity     Outcome Measure Help from another person eating meals?: A Little Help from another person taking care of personal grooming?: A Little Help from another person toileting, which includes using toliet, bedpan, or urinal?: Total Help from another person bathing (including washing, rinsing, drying)?: A Lot Help from another person to put on and taking off regular upper body clothing?: A Little Help from another person to put on and taking off regular lower body clothing?: Total 6 Click Score: 13   End of Session Nurse Communication: Mobility  status  Activity Tolerance: Patient tolerated treatment well Patient left: in bed;with call bell/phone within reach;with bed alarm set  OT Visit Diagnosis: Other abnormalities of gait and mobility (R26.89);Pain;Muscle weakness (generalized) (M62.81) Pain - part of body:  (back)                Time: 0347-4259 OT Time Calculation (min): 23 min Charges:  OT General Charges $OT Visit: 1 Visit OT Evaluation $OT Eval Low Complexity: 1 Low OT Treatments $Self Care/Home Management : 8-22 mins  Delbert Phenix OT OT pager: [phone]  Rosemary Holms 01/05/2021, 12:57 PM

## 2021-01-05 NOTE — Discharge Summary (Signed)
Discharge Summary  Carolyn Robertson HCW:[id number] DOB: Feb 27, 1946  PCP: Burnard Bunting, MD  Admit date: 01/01/2021 Discharge date: 01/05/2021  Time spent: 40 mins   Recommendations for Outpatient Follow-up:  1. Follow-up with PCP  Discharge Diagnoses:  Active Hospital Problems   Diagnosis Date Noted  . Cellulitis 01/01/2021  . Chronic deep vein thrombosis (DVT) ([place]) 01/01/2021  . Hypothyroidism 02/16/2020  . Generalized weakness 02/16/2020  . Stage 4 chronic kidney disease ([place]) 02/19/2018    Resolved Hospital Problems  No resolved problems to display.    Discharge Condition: Stable  Diet recommendation: Low sodium  Vitals:   01/05/21 0558 01/05/21 1411  BP: (!) 148/79 127/64  Pulse: 90 (!) 102  Resp: 18 19  Temp: (!) 97.4 F (36.3 C) 98 F (36.7 C)  SpO2: 98% 100%    History of present illness:  75 year old female with CKD stage IV, CVA, asthma, hypothyroidism, hypertension, sent from [facility] skilled nursing facility showing anemia.  Patient was seen in the ED her hemoglobin was stable but found to have generalized anasarca swelling with right upper extremity cellulitis. Patient has been residing at the rehab facilities for the last 2 months.  She has not been ambulatory for more than a week.  Routine labs showed hemoglobin 7.1 at the facility so sent to the ED. Patient also reports on and off swelling.  She was recently started on doxycycline for cellulitis. In the ED, VSS. Labs showed BUN 57, creatinine 2.1, WBC 15.1, UA showed large leukocytes, many bacteria, WBC greater than 50. Chest x-ray grossly unremarkable. CT abdomen pelvis showed trace bilateral pleural effusions, vague groundglass opacities in the lower lungs, otherwise no acute anomaly seen. In the ED,patient received Rocephin, Tylenol, dose of vancomycin, and the hospitalist service was contacted for further evaluation and management.    Today, patient denies any new complaints, denies any chest  pain, shortness of breath, palpitations, abdominal pain, nausea/vomiting, fever/chills.  Patient stable to discharge to SNF for further rehab needs.     Hospital Course:  Active Problems:   Stage 4 chronic kidney disease (HCC)   Generalized weakness   Hypothyroidism   Cellulitis   Chronic deep vein thrombosis (DVT) (HCC)   Right arm cellulitis in the setting of significant edema Currently afebrile, with resolving leukocytosis BC, no growth to date S/P vancomycin and ceftriaxone--> IV cefazolin--> switch to p.o. Keflex for another 4 days to complete 7 days of therapy, last dose on 01/09/2021 Continue to keep arm elevated Continue with diuresis Wound care consulted  Anasarca/fluid overload with diffuse generalized edema ?hx of ?chronic diastolic HF Suspect due to her underlying CKD Repeat echo showed EF of greater than 75%, no regional wall motion abnormality, left ventricular diastolic parameters were normal Continue Lasix  CKD stage IV with fluid overload hypophosphatemia and metabolic acidosis  Followed by Dr. Moshe Cipro but unable to see for last few months BUN/creatinine appears stable Continue diuresis as above  Hypertension BP controlled Restart home hydralazine, amlodipine  Chronic right femoral DVT Continue Eliquis  Abnormal UA Urine culture grew multiple species, patient asymptomatic Continue antibiotics as above  Iron deficiency/anmeia of CKD Baseline hemoglobin around 11, no evidence of bleeding Iron panel shows iron 22, sats 5%, ferritin 18, vitamin V76-->160, folic acid WNL Gave 1 dose of IV Feraheme on 01/03/2021, may repeat another dose in 1 week Daily supplementation of vitamin B12  Generalized weakness/deconditioning PT/OT  Hypothyroidism Continue home Synthroid  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   Weight as of this encounter: 73.3 kg.    Procedures:  None  Consultations:  None  Discharge Exam: BP 127/64 (BP Location: Left Arm)   Pulse (!) 102   Temp 98 F (36.7 C) (Oral)   Resp 19   Ht 5\' 4"  (1.626 m)   Wt 73.3 kg   LMP  (LMP Unknown)   SpO2 100%   BMI 27.74 kg/m    General: NAD, deconditioned Cardiovascular: S1, S2 present Respiratory: Diminished breath sounds bilaterally Musculoskeletal: BUE swelling, noted multiple skin bruises, trace bilateral lower extremity edema Skin: Noted multiple skin bruises    Discharge Instructions You were cared for by a hospitalist during your hospital stay. If you have any questions about your discharge medications or the care you received while you were in the hospital after you are discharged, you can call the unit and asked to speak with the hospitalist on call if the hospitalist that took care of you is not available. Once you are discharged, your primary care physician will handle any further medical issues. Please note that NO REFILLS for any discharge medications will be authorized once you are discharged, as it is imperative that you return to your primary care physician (or establish a relationship with a primary care physician if you do not have one) for your aftercare needs so that they can reassess your need for medications and monitor your lab values.  Discharge Instructions    Diet - low sodium heart healthy   Complete by: As directed    Increase activity slowly   Complete by: As directed      Allergies as of 01/05/2021      Reactions   Other Swelling   A bandage/gauze that was used on lower extremity wound at [facility] health, unsure type- Pt states her foot turned red and swelled up   Penicillins Swelling, Rash   Has patient had a PCN reaction causing immediate rash, facial/tongue/throat swelling, SOB or lightheadedness with hypotension: Unknown Has patient had a PCN reaction causing severe rash involving  mucus membranes or skin necrosis: Unknown Has patient had a PCN reaction that required hospitalization: No Has patient had a PCN reaction occurring within the last 10 years: No If all of the above answers are "NO", then may proceed with Cephalosporin use.   Sulfa Antibiotics Itching, Swelling   Codeine Nausea Only   Hydrocodone-acetaminophen Other (See Comments)   Causes hallucinations   Naproxen Sodium Swelling, Other (See Comments)   (ALEVE) Red Man Syndrome   Tape Rash, Other (See Comments)   BURNS SKIN --PAPER TAPE IS OKAY      Medication List    STOP taking these medications   doxycycline 100 MG tablet Commonly known as: VIBRA-TABS     TAKE these medications   amLODipine 5 MG tablet Commonly known as: NORVASC Take 5 mg by mouth every morning.   apixaban 5 MG Tabs tablet Commonly known as: ELIQUIS Take 1 tablet (5 mg total) by mouth 2 (two) times daily.   baclofen 10 MG tablet Commonly known as: LIORESAL Take 10 mg by mouth 2 (two) times daily as needed for muscle spasms.   cephALEXin 500 MG capsule Commonly known as: KEFLEX Take 1 capsule (500 mg total) by mouth 3 (three) times daily for 4 days.   cyanocobalamin 1000 MCG/ML injection Commonly known as: (VITAMIN  B-12) Inject 1,000 mcg into the skin every 28 (twenty-eight) days. What changed: Another medication with the same name was added. Make sure you understand how and when to take each.   cyanocobalamin 1000 MCG tablet Take 1 tablet (1,000 mcg total) by mouth daily. Start taking on: January 06, 2021 What changed: You were already taking a medication with the same name, and this prescription was added. Make sure you understand how and when to take each.   Dermacloud Oint Apply 1 application topically See admin instructions. Apply topically to buttocks twice daily and after incontinent episodes   DULoxetine 60 MG capsule Commonly known as: CYMBALTA Take 60 mg by mouth at bedtime.   estrogen  (conjugated)-medroxyprogesterone 0.625-2.5 MG tablet Commonly known as: PREMPRO Take 1 tablet by mouth every morning.   febuxostat 40 MG tablet Commonly known as: ULORIC Take 40 mg by mouth at bedtime.   fluticasone 50 MCG/ACT nasal spray Commonly known as: FLONASE Place 1 spray into both nostrils every morning.   furosemide 40 MG tablet Commonly known as: LASIX Take 1 tablet (40 mg total) by mouth daily. What changed:   when to take this  reasons to take this   gabapentin 100 MG capsule Commonly known as: NEURONTIN Take 100 mg by mouth every morning.   hydrALAZINE 25 MG tablet Commonly known as: APRESOLINE Take 25 mg by mouth 2 (two) times daily.   HYDROcodone-acetaminophen 5-325 MG tablet Commonly known as: NORCO/VICODIN Take 1 tablet by mouth every 12 (twelve) hours as needed for severe pain.   mirtazapine 15 MG tablet Commonly known as: REMERON Take 7.5 mg by mouth at bedtime.   mirtazapine 7.5 MG tablet Commonly known as: REMERON Take 7.5 mg by mouth at bedtime.   nystatin-triamcinolone cream Commonly known as: MYCOLOG II Apply 1 application topically 2 (two) times daily.   pantoprazole 40 MG tablet Commonly known as: PROTONIX Take 40 mg by mouth daily before breakfast.   polyethylene glycol 17 g packet Commonly known as: MIRALAX / GLYCOLAX Take 17 g by mouth daily as needed (constipation). Mix in beverage of choice   predniSONE 5 MG tablet Commonly known as: DELTASONE Take 10 mg by mouth every morning.   Synthroid 175 MCG tablet Generic drug: levothyroxine Take 175 mcg by mouth See admin instructions. Take one tablet (175 mcg) by mouth on Sunday, Tuesday, Thursday, Saturday before breakfast (take 200 mcg Monday, Wednesday, Friday)   levothyroxine 200 MCG tablet Commonly known as: SYNTHROID Take 200 mcg by mouth See admin instructions. Take one tablet (200 mcg) by mouth on Monday, Wednesday, Friday before breakfast (take 175 mcg Sunday, Tuesday,  Thursday, Saturday)      Allergies  Allergen Reactions  . Other Swelling    A bandage/gauze that was used on lower extremity wound at [facility] health, unsure type- Pt states her foot turned red and swelled up  . Penicillins Swelling and Rash    Has patient had a PCN reaction causing immediate rash, facial/tongue/throat swelling, SOB or lightheadedness with hypotension: Unknown Has patient had a PCN reaction causing severe rash involving mucus membranes or skin necrosis: Unknown Has patient had a PCN reaction that required hospitalization: No Has patient had a PCN reaction occurring within the last 10 years: No If all of the above answers are "NO", then may proceed with Cephalosporin use.   . Sulfa Antibiotics Itching and Swelling  . Codeine Nausea Only  . Hydrocodone-Acetaminophen Other (See Comments)    Causes hallucinations  . Naproxen Sodium Swelling  and Other (See Comments)    (ALEVE) Red Man Syndrome  . Tape Rash and Other (See Comments)    BURNS SKIN --PAPER TAPE IS OKAY    Follow-up Information    Burnard Bunting, MD. Schedule an appointment as soon as possible for a visit.   Specialty: Internal Medicine Contact information: [place] [address] [phone]                The results of significant diagnostics from this hospitalization (including imaging, microbiology, ancillary and laboratory) are listed below for reference.    Significant Diagnostic Studies: CT ABDOMEN PELVIS WO CONTRAST  Result Date: 01/01/2021 CLINICAL DATA:  Abdominal distention EXAM: CT ABDOMEN AND PELVIS WITHOUT CONTRAST TECHNIQUE: Multidetector CT imaging of the abdomen and pelvis was performed following the standard protocol without IV contrast. COMPARISON:  03/12/2020 FINDINGS: Lower chest: Trace bilateral effusions. Ground-glass airspace opacities in both lower lobes could reflect early infiltrates. Heart is normal size. Hepatobiliary: No focal liver abnormality is  seen. Status post cholecystectomy. No biliary dilatation. Pancreas: No focal abnormality or ductal dilatation. Spleen: No focal abnormality.  Normal size. Adrenals/Urinary Tract: No adrenal abnormality. No focal renal abnormality. No stones or hydronephrosis. Urinary bladder is unremarkable. Stomach/Bowel: Normal appendix. Stomach, large and small bowel grossly unremarkable. Vascular/Lymphatic: Aortic atherosclerosis. Reproductive: Prior hysterectomy.  No adnexal masses. Other: No free fluid or free air. Musculoskeletal: No acute bony abnormality. Postoperative changes in the lower lumbar spine. IMPRESSION: Trace bilateral pleural effusions. Vague ground-glass opacities in the lower lungs could reflect early infiltrates or bronchopneumonia. Aortic atherosclerosis. No acute findings in the abdomen or pelvis. Electronically Signed   By: Rolm Baptise M.D.   On: 01/01/2021 18:25   DG Chest Port 1 View  Result Date: 01/01/2021 CLINICAL DATA:  Questionable sepsis EXAM: PORTABLE CHEST 1 VIEW COMPARISON:  11/05/2020 FINDINGS: Heart and mediastinal contours are within normal limits. No focal opacities or effusions. No acute bony abnormality. IMPRESSION: No active disease. Electronically Signed   By: Rolm Baptise M.D.   On: 01/01/2021 17:38   ECHOCARDIOGRAM COMPLETE  Result Date: 01/05/2021    ECHOCARDIOGRAM REPORT   Patient Name:   Carolyn Robertson Date of Exam: 01/05/2021 Medical Rec #:  [id number]        Height:       64.0 in Accession #:    [id number]       Weight:       161.6 lb Date of Birth:  1945/11/21        BSA:          1.787 m Patient Age:    6 years         BP:           148/79 mmHg Patient Gender: F                HR:           90 bpm. Exam Location:  Inpatient Procedure: 2D Echo Indications:    CHF-Acute Diastolic I68.03  History:        Patient has prior history of Echocardiogram examinations, most                 recent 07/05/2018. TIA; Risk Factors:Hypertension.  Sonographer:    Mikki Santee RDCS  (AE) Referring Phys: [id number] [place]  1. Hyperdynamic LV with minimal intracavitary gradient ~5 mmHG. No LVOT obstruction. Left ventricular ejection fraction, by estimation, is >75%. The left ventricle has hyperdynamic function. The  left ventricle has no regional wall motion abnormalities. There is mild asymmetric left ventricular hypertrophy of the basal-septal segment. Left ventricular diastolic parameters were normal.  2. Right ventricular systolic function is normal. The right ventricular size is normal.  3. The mitral valve is grossly normal. Mild mitral valve regurgitation. No evidence of mitral stenosis.  4. The aortic valve is tricuspid. Aortic valve regurgitation is not visualized. No aortic stenosis is present. Comparison(s): No significant change from prior study. FINDINGS  Left Ventricle: Hyperdynamic LV with minimal intracavitary gradient ~5 mmHG. No LVOT obstruction. Left ventricular ejection fraction, by estimation, is >75%. The left ventricle has hyperdynamic function. The left ventricle has no regional wall motion abnormalities. The left ventricular internal cavity size was normal in size. There is mild asymmetric left ventricular hypertrophy of the basal-septal segment. Left ventricular diastolic parameters were normal. Right Ventricle: The right ventricular size is normal. No increase in right ventricular wall thickness. Right ventricular systolic function is normal. Left Atrium: Left atrial size was normal in size. Right Atrium: Right atrial size was normal in size. Pericardium: There is no evidence of pericardial effusion. Mitral Valve: The mitral valve is grossly normal. Mild mitral valve regurgitation. No evidence of mitral valve stenosis. Tricuspid Valve: The tricuspid valve is grossly normal. Tricuspid valve regurgitation is mild . No evidence of tricuspid stenosis. Aortic Valve: The aortic valve is tricuspid. Aortic valve regurgitation is not visualized. No aortic  stenosis is present. Pulmonic Valve: The pulmonic valve was grossly normal. Pulmonic valve regurgitation is not visualized. No evidence of pulmonic stenosis. Aorta: The aortic root and ascending aorta are structurally normal, with no evidence of dilitation. Venous: The inferior vena cava was not well visualized. IAS/Shunts: The atrial septum is grossly normal.  LEFT VENTRICLE PLAX 2D LVIDd:         2.90 cm  Diastology LVIDs:         1.80 cm  LV e' medial:    8.38 cm/s LV PW:         1.00 cm  LV E/e' medial:  12.4 LV IVS:        1.00 cm  LV e' lateral:   11.20 cm/s LVOT diam:     1.85 cm  LV E/e' lateral: 9.3 LV SV:         69 LV SV Index:   39 LVOT Area:     2.69 cm  RIGHT VENTRICLE RV S prime:     14.10 cm/s TAPSE (M-mode): 1.8 cm LEFT ATRIUM             Index       RIGHT ATRIUM          Index LA diam:        3.00 cm 1.68 cm/m  RA Area:     7.22 cm LA Vol (A2C):   26.1 ml 14.61 ml/m RA Volume:   10.60 ml 5.93 ml/m LA Vol (A4C):   45.0 ml 25.18 ml/m LA Biplane Vol: 35.2 ml 19.70 ml/m  AORTIC VALVE LVOT Vmax:   147.00 cm/s LVOT Vmean:  107.000 cm/s LVOT VTI:    0.256 m  AORTA Ao Root diam: 2.80 cm MITRAL VALVE                TRICUSPID VALVE MV Area (PHT): 3.46 cm     TR Peak grad:   35.8 mmHg MV Decel Time: 219 msec     TR Vmax:        299.00 cm/s MV  E velocity: 104.00 cm/s MV A velocity: 128.00 cm/s  SHUNTS MV E/A ratio:  0.81         Systemic VTI:  0.26 m                             Systemic Diam: 1.85 cm Eleonore Chiquito MD Electronically signed by Eleonore Chiquito MD Signature Date/Time: 01/05/2021/3:12:51 PM    Final     Microbiology: Recent Results (from the past 240 hour(s))  Blood culture (routine single)     Status: None (Preliminary result)   Collection Time: 01/01/21  4:50 PM   Specimen: BLOOD  Result Value Ref Range Status   Specimen Description   Final    BLOOD LEFT ANTECUBITAL Performed at [facility] [address], [place], [place] [address]    Special Requests    Final    BOTTLES DRAWN AEROBIC AND ANAEROBIC Blood Culture adequate volume Performed at [facility] [address], [place], [place] [address]    Culture   Final    NO GROWTH 4 DAYS Performed at [facility], [place] [address], [place], [place] [address]    Report Status PENDING  Incomplete  Urine culture     Status: Abnormal   Collection Time: 01/01/21  7:27 PM   Specimen: In/Out Cath Urine  Result Value Ref Range Status   Specimen Description   Final    IN/OUT CATH URINE Performed at [facility] [address], [place], [place] [address]    Special Requests   Final    NONE Performed at [facility], [place] [address], [place], [place] [address]    Culture MULTIPLE SPECIES PRESENT, SUGGEST RECOLLECTION (A)  Final   Report Status 01/03/2021 FINAL  Final  SARS CORONAVIRUS 2 (TAT 6-24 HRS) Nasopharyngeal Nasopharyngeal Swab     Status: None   Collection Time: 01/01/21  9:20 PM   Specimen: Nasopharyngeal Swab  Result Value Ref Range Status   SARS Coronavirus 2 NEGATIVE NEGATIVE Final    Comment: (NOTE) SARS-CoV-2 target nucleic acids are NOT DETECTED.  The SARS-CoV-2 RNA is generally detectable in upper and lower respiratory specimens during the acute phase of infection. Negative results do not preclude SARS-CoV-2 infection, do not rule out co-infections with other pathogens, and should not be used as the sole basis for treatment or other patient management decisions. Negative results must be combined with clinical observations, patient history, and epidemiological information. The expected result is Negative.  Fact Sheet for Patients: SugarRoll.be  Fact Sheet for Healthcare Providers: https://www.woods-mathews.com/  This test is not yet approved or cleared by the [place] FDA and  has been authorized for detection and/or diagnosis of SARS-CoV-2 by FDA under an  Emergency Use Authorization (EUA). This EUA will remain  in effect (meaning this test can be used) for the duration of the COVID-19 declaration under Se ction 564(b)(1) of the Act, 21 U.S.C. section 360bbb-3(b)(1), unless the authorization is terminated or revoked sooner.  Performed at [facility], [place] [address], [place], [place] [address]   MRSA PCR Screening     Status: Abnormal   Collection Time: 01/02/21  5:52 PM   Specimen: Nasopharyngeal  Result Value Ref Range Status   MRSA by PCR POSITIVE (A) NEGATIVE Final    Comment:        The GeneXpert MRSA Assay (FDA approved for NASAL specimens only), is one  component of a comprehensive MRSA colonization surveillance program. It is not intended to diagnose MRSA infection nor to guide or monitor treatment for MRSA infections. RESULT CALLED TO, READ BACK BY AND VERIFIED WITH: Vance Gather RN 01/02/21 @1906  BY P.HENDERSON Performed at [facility], [place] [address] [place], [place] [address]   SARS CORONAVIRUS 2 (TAT 6-24 HRS) Nasopharyngeal Nasopharyngeal Swab     Status: None   Collection Time: 01/04/21  3:25 PM   Specimen: Nasopharyngeal Swab  Result Value Ref Range Status   SARS Coronavirus 2 NEGATIVE NEGATIVE Final    Comment: (NOTE) SARS-CoV-2 target nucleic acids are NOT DETECTED.  The SARS-CoV-2 RNA is generally detectable in upper and lower respiratory specimens during the acute phase of infection. Negative results do not preclude SARS-CoV-2 infection, do not rule out co-infections with other pathogens, and should not be used as the sole basis for treatment or other patient management decisions. Negative results must be combined with clinical observations, patient history, and epidemiological information. The expected result is Negative.  Fact Sheet for Patients: SugarRoll.be  Fact Sheet for Healthcare  Providers: https://www.woods-mathews.com/  This test is not yet approved or cleared by the [place] FDA and  has been authorized for detection and/or diagnosis of SARS-CoV-2 by FDA under an Emergency Use Authorization (EUA). This EUA will remain  in effect (meaning this test can be used) for the duration of the COVID-19 declaration under Se ction 564(b)(1) of the Act, 21 U.S.C. section 360bbb-3(b)(1), unless the authorization is terminated or revoked sooner.  Performed at [facility], [place] [address], [place], [place] [address]      Labs: Basic Metabolic Panel: Recent Labs  Lab 01/01/21 1733 01/01/21 2130 01/02/21 0541 01/03/21 0531 01/04/21 0731 01/04/21 1542 01/05/21 0646  NA 140  --  137 137 137  --  139  K 5.0  --  4.5 3.7 2.8* 4.0 3.8  CL 108  --  106 104 104  --  107  CO2 19*  --  20* 19* 22  --  23  GLUCOSE 119*  --  86 109* 86  --  87  BUN 57*  --  56* 57* 57*  --  51*  CREATININE 2.11*  --  2.10* 2.10* 2.08*  --  2.08*  CALCIUM 8.5*  --  8.4* 8.1* 7.9*  --  8.3*  MG  --  2.8*  --   --  2.4 2.4 2.4  PHOS  --  4.9*  --   --   --   --   --    Liver Function Tests: Recent Labs  Lab 01/01/21 1733 01/02/21 0541 01/03/21 0531 01/04/21 0731 01/05/21 0646  AST 32 29 30 25 22   ALT 31 29 30 26 18   ALKPHOS 129* 118 115 110 112  BILITOT 0.3 0.9 0.7 1.1 0.9  PROT 6.2* 5.8* 5.9* 5.4* 5.5*  ALBUMIN 3.1* 3.1* 3.0* 2.9* 2.9*   No results for input(s): LIPASE, AMYLASE in the last 168 hours. No results for input(s): AMMONIA in the last 168 hours. CBC: Recent Labs  Lab 01/01/21 1733 01/02/21 0541 01/03/21 0531 01/04/21 0731 01/05/21 0646  WBC 15.1* 13.5* 13.4* 10.4 11.8*  NEUTROABS 14.0*  --   --   --   --   HGB 8.7* 8.0* 8.1* 7.6* 8.2*  HCT 28.3* 28.1* 28.0* 26.6* 28.1*  MCV 87.1 88.9 88.9 88.1 87.8  PLT 366 327 342 315 348   Cardiac Enzymes: No results for input(s):  CKTOTAL, CKMB, CKMBINDEX, TROPONINI in the last 168 hours. BNP: BNP  (last 3 results) Recent Labs    11/06/20 0003 11/15/20 0257  BNP 90.3 73.8    ProBNP (last 3 results) No results for input(s): PROBNP in the last 8760 hours.  CBG: No results for input(s): GLUCAP in the last 168 hours.     Signed:  Alma Friendly, MD [facility] 01/05/2021, 3:17 PM

## 2021-01-05 NOTE — TOC Transition Note (Signed)
Transition of Care [facility]) - CM/SW Discharge Note   Patient Details  Name: Carolyn Robertson MRN: [id number] Date of Birth: 11-18-1945  [facility] CM/SW Contact:  Lynnell Catalan, RN Phone Number: 01/05/2021, 3:44 PM   Clinical Narrative:    Pt to dc back to [facility] room 215. [facility] called to transport. RN to call report to [phone].   Final next level of care: [place] Barriers to Discharge: No Barriers Identified   Patient Goals and CMS Choice Patient states their goals for this hospitalization and ongoing recovery are:: To get better      Discharge Placement              Patient chooses bed at: [facility] Patient to be transferred to facility by: [facility]      Discharge Plan and Services   Discharge Planning Services: CM Consult

## 2021-01-06 DIAGNOSIS — I82511 Chronic embolism and thrombosis of right femoral vein: Secondary | ICD-10-CM | POA: Diagnosis not present

## 2021-01-06 DIAGNOSIS — L03113 Cellulitis of right upper limb: Secondary | ICD-10-CM | POA: Diagnosis not present

## 2021-01-06 DIAGNOSIS — L03114 Cellulitis of left upper limb: Secondary | ICD-10-CM | POA: Diagnosis not present

## 2021-01-06 DIAGNOSIS — F32A Depression, unspecified: Secondary | ICD-10-CM | POA: Diagnosis not present

## 2021-01-06 DIAGNOSIS — J45909 Unspecified asthma, uncomplicated: Secondary | ICD-10-CM | POA: Diagnosis not present

## 2021-01-06 DIAGNOSIS — G8929 Other chronic pain: Secondary | ICD-10-CM | POA: Diagnosis not present

## 2021-01-06 DIAGNOSIS — I639 Cerebral infarction, unspecified: Secondary | ICD-10-CM | POA: Diagnosis not present

## 2021-01-06 DIAGNOSIS — E039 Hypothyroidism, unspecified: Secondary | ICD-10-CM | POA: Diagnosis not present

## 2021-01-06 DIAGNOSIS — D649 Anemia, unspecified: Secondary | ICD-10-CM | POA: Diagnosis not present

## 2021-01-06 DIAGNOSIS — F419 Anxiety disorder, unspecified: Secondary | ICD-10-CM | POA: Diagnosis not present

## 2021-01-06 DIAGNOSIS — I129 Hypertensive chronic kidney disease with stage 1 through stage 4 chronic kidney disease, or unspecified chronic kidney disease: Secondary | ICD-10-CM | POA: Diagnosis not present

## 2021-01-06 DIAGNOSIS — K219 Gastro-esophageal reflux disease without esophagitis: Secondary | ICD-10-CM | POA: Diagnosis not present

## 2021-01-06 LAB — CULTURE, BLOOD (SINGLE)
Culture: NO GROWTH
Special Requests: ADEQUATE

## 2021-01-11 DIAGNOSIS — G8929 Other chronic pain: Secondary | ICD-10-CM | POA: Diagnosis not present

## 2021-01-11 DIAGNOSIS — L03114 Cellulitis of left upper limb: Secondary | ICD-10-CM | POA: Diagnosis not present

## 2021-01-11 DIAGNOSIS — I10 Essential (primary) hypertension: Secondary | ICD-10-CM | POA: Diagnosis not present

## 2021-01-11 DIAGNOSIS — I129 Hypertensive chronic kidney disease with stage 1 through stage 4 chronic kidney disease, or unspecified chronic kidney disease: Secondary | ICD-10-CM | POA: Diagnosis not present

## 2021-01-11 DIAGNOSIS — R609 Edema, unspecified: Secondary | ICD-10-CM | POA: Diagnosis not present

## 2021-01-11 DIAGNOSIS — I82511 Chronic embolism and thrombosis of right femoral vein: Secondary | ICD-10-CM | POA: Diagnosis not present

## 2021-01-11 DIAGNOSIS — M6281 Muscle weakness (generalized): Secondary | ICD-10-CM | POA: Diagnosis not present

## 2021-01-11 DIAGNOSIS — R54 Age-related physical debility: Secondary | ICD-10-CM | POA: Diagnosis not present

## 2021-01-11 DIAGNOSIS — L03113 Cellulitis of right upper limb: Secondary | ICD-10-CM | POA: Diagnosis not present

## 2021-01-14 DIAGNOSIS — R627 Adult failure to thrive: Secondary | ICD-10-CM | POA: Diagnosis not present

## 2021-01-14 DIAGNOSIS — M6281 Muscle weakness (generalized): Secondary | ICD-10-CM | POA: Diagnosis not present

## 2021-01-14 DIAGNOSIS — I82511 Chronic embolism and thrombosis of right femoral vein: Secondary | ICD-10-CM | POA: Diagnosis not present

## 2021-01-14 DIAGNOSIS — L03113 Cellulitis of right upper limb: Secondary | ICD-10-CM | POA: Diagnosis not present

## 2021-01-14 DIAGNOSIS — R54 Age-related physical debility: Secondary | ICD-10-CM | POA: Diagnosis not present

## 2021-01-14 DIAGNOSIS — L03114 Cellulitis of left upper limb: Secondary | ICD-10-CM | POA: Diagnosis not present

## 2021-01-14 DIAGNOSIS — R609 Edema, unspecified: Secondary | ICD-10-CM | POA: Diagnosis not present

## 2021-01-14 DIAGNOSIS — G8929 Other chronic pain: Secondary | ICD-10-CM | POA: Diagnosis not present

## 2021-01-14 DIAGNOSIS — J45909 Unspecified asthma, uncomplicated: Secondary | ICD-10-CM | POA: Diagnosis not present

## 2021-01-15 DIAGNOSIS — D649 Anemia, unspecified: Secondary | ICD-10-CM | POA: Diagnosis not present

## 2021-01-15 DIAGNOSIS — L03113 Cellulitis of right upper limb: Secondary | ICD-10-CM | POA: Diagnosis not present

## 2021-01-15 DIAGNOSIS — R609 Edema, unspecified: Secondary | ICD-10-CM | POA: Diagnosis not present

## 2021-01-15 DIAGNOSIS — G8929 Other chronic pain: Secondary | ICD-10-CM | POA: Diagnosis not present

## 2021-01-15 DIAGNOSIS — M6281 Muscle weakness (generalized): Secondary | ICD-10-CM | POA: Diagnosis not present

## 2021-01-15 DIAGNOSIS — I10 Essential (primary) hypertension: Secondary | ICD-10-CM | POA: Diagnosis not present

## 2021-01-15 DIAGNOSIS — R627 Adult failure to thrive: Secondary | ICD-10-CM | POA: Diagnosis not present

## 2021-01-15 DIAGNOSIS — L03114 Cellulitis of left upper limb: Secondary | ICD-10-CM | POA: Diagnosis not present

## 2021-01-15 DIAGNOSIS — F419 Anxiety disorder, unspecified: Secondary | ICD-10-CM | POA: Diagnosis not present

## 2021-01-15 DIAGNOSIS — I129 Hypertensive chronic kidney disease with stage 1 through stage 4 chronic kidney disease, or unspecified chronic kidney disease: Secondary | ICD-10-CM | POA: Diagnosis not present

## 2021-01-15 DIAGNOSIS — I82511 Chronic embolism and thrombosis of right femoral vein: Secondary | ICD-10-CM | POA: Diagnosis not present

## 2021-01-18 ENCOUNTER — Non-Acute Institutional Stay: Payer: Medicare Other | Admitting: Nurse Practitioner

## 2021-01-18 ENCOUNTER — Other Ambulatory Visit: Payer: Self-pay

## 2021-01-18 DIAGNOSIS — R600 Localized edema: Secondary | ICD-10-CM

## 2021-01-18 DIAGNOSIS — Z515 Encounter for palliative care: Secondary | ICD-10-CM | POA: Diagnosis not present

## 2021-01-18 NOTE — Progress Notes (Signed)
[facility] Consult Note Telephone: [phone]  Fax: [phone]  PATIENT NAME: Carolyn Robertson [address] [address] [phone] (home)  DOB: 02/03/1946 MRN: [id number]  PRIMARY CARE PROVIDER:    Burnard Bunting, MD,  [place] [address] [phone]  REFERRING PROVIDER:   Burnard Bunting, [place],  [place] [address] [phone]  RESPONSIBLE PARTY:   Extended Emergency Contact Information Primary Emergency Contact: Carolyn Robertson, Carolyn Robertson Address: [address]          [place], [place] [address] [name] of [place] Phone: [phone] Mobile Phone: [phone] Relation: Spouse Secondary Emergency Contact: Carolyn Robertson, Carolyn Robertson Mobile Phone: [phone] Relation: Daughter  I met face to face with patient in facility.   ASSESSMENT AND RECOMMENDATIONS:   Advance Care Planning: Unable to reach husband on phone to review ACP. Code status: DNR Goal of care: goal of care is function Directives: Signed DNR and MOST form present in facility, copy on [facility] EMR. Details of MOST form includes, DNR/no cpr, full scope of treatment, antibiotics if indicated, IV fluids if indicated.  Symptom Management: loo Edema: +3 edema to bilateral lower legs, +2 edema to bilateral arms. Patient currently on Lasix 21m by mouth daily. Patient with CKD 4, Cr/GFR 2.08/24 on 01/05/2021. Recommendation: Recommend outpatient Nephrology consult. Weakness: weakness is ongoing related to deconditioning from comobid conditions. Facility report patient discharged from PT/OT due to non participation. Patient in no acute distress on exam. Continue to offer assistance as needed, encourage to do more for self. Maintain skin integrity by assisting with routine turns and prompt incontinence care. Maintain safety, prevent falls.  Follow up Palliative Care Visit: Palliative care will continue to follow  for complex decision making and symptom management. Return 4-8 weeks or prn.  Cognitive / Functional decline: Patient awake, alert, and coherent. Total dependent on staff for her ADLs, able to feed self. Non-ambulatory, requires assistance transferring from bed to to wheelchair. No recent falls.  I spent 50 minutes providing this consultation. More than 50% of the time in this consultation was spent counseling and coordinating communication.   CHIEF COMPLAINT: Bilateral arms and legs Edema  HISTORY OF PRESENT ILLNESS:Carolyn Robertson a 75y.o.femalewithhistory ofVasculr dementia(FAST 6d), hx CVA, stage 4 CKD, CHF (EF 70%), hypothyroidism,Osteoarthritis, GERD, gout, Hiatal hernia.[facility] and discussion with facility staff and patient. Records reviewed and summarized bellow. Patient report worsening edema to bilateral arms and legs, symptoms ongoing since Hospital discharge on 01/05/2021. Patient currently on Lasix 424mby mouth daily, without significant improvement in condition. Patient was treated with antibiotic for cellulitis on left lower leg, she was discharged on Keflex and was treated again in facility with Doxycycline. Patient denied fever, denied chills, denied uncontrolled pain or SOB. Patient denied chest pain or palpitation, no report of acute bleed related to anticoagulant use. Palliative care was asked to help address advance care planning, goals of care, and symptoms management.  PPS: weak 40%   HOSPICE ELIGIBILITY/DIAGNOSIS: TBD  Physical Exam: Current and past weights: 155.7lbs  General: frail appearing, deconditioned, lying in bed in NAD EYES: anicteric sclera, no discharge  ENMT: intact hearing, oral mucous membranes moist CV:  +3 edema to BLE, +2 edema to bilateral arms Pulmonary: no increased work of breathing, no cough, no audible wheezes, room air Abdomen:  no ascites GU: deferred MSK: sarcopenia, decreased ROM to  lower extrimities, bilateral foot drop, non ambulatory Skin:  multiple skin bruises and ecchymotic areas on bilateral legs Neuro: Generalized weakness, otherwise non-focal Psych: non-anxious affect today, A and O x 3 Hem/lymph/immuno: no widespread bruising   PAST MEDICAL HISTORY:  Past Medical History:  Diagnosis Date  . Anxiety   . Arthritis    RA  . 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 Depression   . GERD (gastroesophageal reflux disease)   . History of blood transfusion    after miscarrige  . History of kidney stones    x 2  . Hypertension   . Hypothyroidism   . Incontinent of urine    incontinent at night  . Kidney failure   . Memory loss    "after TIA"  . Pneumonia    as a child and teenager  . Renal disorder    stage IV kidney disease  . Stroke ([facility])   . Thyroid disease   . TIA (transient ischemic attack) 06/2018    SOCIAL HX:  Social History   Tobacco Use  . Smoking status: Never Smoker  . Smokeless tobacco: Never Used  Substance Use Topics  . Alcohol use: No    Comment: rarely   FAMILY HX:  Family History  Problem Relation Age of Onset  . Atrial fibrillation Mother   . Stroke Father     ALLERGIES:  Allergies  Allergen Reactions  . Other Swelling    A bandage/gauze that was used on lower extremity wound at [facility] health, unsure type- Pt states her foot turned red and swelled up  . Penicillins Swelling and Rash    Has patient had a PCN reaction causing immediate rash, facial/tongue/throat swelling, SOB or lightheadedness with hypotension: Unknown Has patient had a PCN reaction causing severe rash involving mucus membranes or skin necrosis: Unknown Has patient had a PCN reaction that required hospitalization: No Has patient had a PCN reaction occurring within the last 10 years: No If all of the above answers are "NO", then may proceed with Cephalosporin use.   . Sulfa Antibiotics Itching and Swelling  . Codeine Nausea Only   . Hydrocodone-Acetaminophen Other (See Comments)    Causes hallucinations  . Naproxen Sodium Swelling and Other (See Comments)    (ALEVE) Red Man Syndrome  . Tape Rash and Other (See Comments)    BURNS SKIN --PAPER TAPE IS OKAY     PERTINENT MEDICATIONS:  Outpatient Encounter Medications as of 01/18/2021  Medication Sig  . amLODipine (NORVASC) 5 MG tablet Take 5 mg by mouth every morning.  Marland Kitchen apixaban (ELIQUIS) 5 MG TABS tablet Take 1 tablet (5 mg total) by mouth 2 (two) times daily.  . baclofen (LIORESAL) 10 MG tablet Take 10 mg by mouth 2 (two) times daily as needed for muscle spasms.  . cyanocobalamin (,VITAMIN B-12,) 1000 MCG/ML injection Inject 1,000 mcg into the skin every 28 (twenty-eight) days.  . DULoxetine (CYMBALTA) 60 MG capsule Take 60 mg by mouth at bedtime.  Marland Kitchen estrogen, conjugated,-medroxyprogesterone (PREMPRO) 0.625-2.5 MG per tablet Take 1 tablet by mouth every morning.  . febuxostat (ULORIC) 40 MG tablet Take 40 mg by mouth at bedtime.  . fluticasone (FLONASE) 50 MCG/ACT nasal spray Place 1 spray into both nostrils every morning.  . furosemide (LASIX) 40 MG tablet Take 1 tablet (40 mg total) by mouth daily.  Marland Kitchen gabapentin (NEURONTIN) 100 MG capsule Take 100 mg by mouth every morning.  . hydrALAZINE (APRESOLINE) 25 MG tablet Take 25 mg by mouth 2 (two) times daily.  Marland Kitchen  HYDROcodone-acetaminophen (NORCO/VICODIN) 5-325 MG tablet Take 1 tablet by mouth every 12 (twelve) hours as needed for severe pain.  . Infant Care Products [facility]) OINT Apply 1 application topically See admin instructions. Apply topically to buttocks twice daily and after incontinent episodes  . levothyroxine (SYNTHROID) 200 MCG tablet Take 200 mcg by mouth See admin instructions. Take one tablet (200 mcg) by mouth on Monday, Wednesday, Friday before breakfast (take 175 mcg Sunday, Tuesday, Thursday, Saturday)  . mirtazapine (REMERON) 15 MG tablet Take 7.5 mg by mouth at bedtime.  . mirtazapine (REMERON) 7.5  MG tablet Take 7.5 mg by mouth at bedtime. (Patient not taking: No sig reported)  . nystatin-triamcinolone (MYCOLOG II) cream Apply 1 application topically 2 (two) times daily.  . pantoprazole (PROTONIX) 40 MG tablet Take 40 mg by mouth daily before breakfast.  . polyethylene glycol (MIRALAX / GLYCOLAX) 17 g packet Take 17 g by mouth daily as needed (constipation). Mix in beverage of choice  . predniSONE (DELTASONE) 5 MG tablet Take 10 mg by mouth every morning.  Marland Kitchen SYNTHROID 175 MCG tablet Take 175 mcg by mouth See admin instructions. Take one tablet (175 mcg) by mouth on Sunday, Tuesday, Thursday, Saturday before breakfast (take 200 mcg Monday, Wednesday, Friday)  . vitamin B-12 1000 MCG tablet Take 1 tablet (1,000 mcg total) by mouth daily.   No facility-administered encounter medications on file as of 01/18/2021.    Thank you for the opportunity to participate in the care of Carolyn Robertson. The palliative care team will continue to follow. Please call our office at [phone] if we can be of additional assistance.  Jari Favre, DNP, AGPCNP-BC

## 2021-01-19 DIAGNOSIS — M6281 Muscle weakness (generalized): Secondary | ICD-10-CM | POA: Diagnosis not present

## 2021-01-19 DIAGNOSIS — R627 Adult failure to thrive: Secondary | ICD-10-CM | POA: Diagnosis not present

## 2021-01-19 DIAGNOSIS — F32A Depression, unspecified: Secondary | ICD-10-CM | POA: Diagnosis not present

## 2021-01-19 DIAGNOSIS — R609 Edema, unspecified: Secondary | ICD-10-CM | POA: Diagnosis not present

## 2021-01-19 DIAGNOSIS — I129 Hypertensive chronic kidney disease with stage 1 through stage 4 chronic kidney disease, or unspecified chronic kidney disease: Secondary | ICD-10-CM | POA: Diagnosis not present

## 2021-01-19 DIAGNOSIS — I10 Essential (primary) hypertension: Secondary | ICD-10-CM | POA: Diagnosis not present

## 2021-01-19 DIAGNOSIS — R54 Age-related physical debility: Secondary | ICD-10-CM | POA: Diagnosis not present

## 2021-01-23 DIAGNOSIS — M1991 Primary osteoarthritis, unspecified site: Secondary | ICD-10-CM | POA: Diagnosis not present

## 2021-01-23 DIAGNOSIS — L03115 Cellulitis of right lower limb: Secondary | ICD-10-CM | POA: Diagnosis not present

## 2021-01-23 DIAGNOSIS — R627 Adult failure to thrive: Secondary | ICD-10-CM | POA: Diagnosis not present

## 2021-01-23 DIAGNOSIS — F419 Anxiety disorder, unspecified: Secondary | ICD-10-CM | POA: Diagnosis not present

## 2021-01-23 DIAGNOSIS — I82511 Chronic embolism and thrombosis of right femoral vein: Secondary | ICD-10-CM | POA: Diagnosis not present

## 2021-01-23 DIAGNOSIS — G8929 Other chronic pain: Secondary | ICD-10-CM | POA: Diagnosis not present

## 2021-01-23 DIAGNOSIS — M6281 Muscle weakness (generalized): Secondary | ICD-10-CM | POA: Diagnosis not present

## 2021-01-23 DIAGNOSIS — J45909 Unspecified asthma, uncomplicated: Secondary | ICD-10-CM | POA: Diagnosis not present

## 2021-01-23 DIAGNOSIS — F32A Depression, unspecified: Secondary | ICD-10-CM | POA: Diagnosis not present

## 2021-01-23 DIAGNOSIS — K219 Gastro-esophageal reflux disease without esophagitis: Secondary | ICD-10-CM | POA: Diagnosis not present

## 2021-01-24 DIAGNOSIS — L03115 Cellulitis of right lower limb: Secondary | ICD-10-CM | POA: Diagnosis not present

## 2021-01-24 DIAGNOSIS — F32A Depression, unspecified: Secondary | ICD-10-CM | POA: Diagnosis not present

## 2021-01-24 DIAGNOSIS — R609 Edema, unspecified: Secondary | ICD-10-CM | POA: Diagnosis not present

## 2021-01-24 DIAGNOSIS — M6281 Muscle weakness (generalized): Secondary | ICD-10-CM | POA: Diagnosis not present

## 2021-01-24 DIAGNOSIS — M1991 Primary osteoarthritis, unspecified site: Secondary | ICD-10-CM | POA: Diagnosis not present

## 2021-01-24 DIAGNOSIS — F419 Anxiety disorder, unspecified: Secondary | ICD-10-CM | POA: Diagnosis not present

## 2021-01-24 DIAGNOSIS — K219 Gastro-esophageal reflux disease without esophagitis: Secondary | ICD-10-CM | POA: Diagnosis not present

## 2021-01-27 DIAGNOSIS — M1991 Primary osteoarthritis, unspecified site: Secondary | ICD-10-CM | POA: Diagnosis not present

## 2021-01-27 DIAGNOSIS — F32A Depression, unspecified: Secondary | ICD-10-CM | POA: Diagnosis not present

## 2021-01-27 DIAGNOSIS — I82511 Chronic embolism and thrombosis of right femoral vein: Secondary | ICD-10-CM | POA: Diagnosis not present

## 2021-01-27 DIAGNOSIS — M6281 Muscle weakness (generalized): Secondary | ICD-10-CM | POA: Diagnosis not present

## 2021-01-27 DIAGNOSIS — R627 Adult failure to thrive: Secondary | ICD-10-CM | POA: Diagnosis not present

## 2021-01-27 DIAGNOSIS — R609 Edema, unspecified: Secondary | ICD-10-CM | POA: Diagnosis not present

## 2021-01-27 DIAGNOSIS — G8929 Other chronic pain: Secondary | ICD-10-CM | POA: Diagnosis not present

## 2021-01-28 DIAGNOSIS — M6281 Muscle weakness (generalized): Secondary | ICD-10-CM | POA: Diagnosis not present

## 2021-01-28 DIAGNOSIS — R627 Adult failure to thrive: Secondary | ICD-10-CM | POA: Diagnosis not present

## 2021-01-28 DIAGNOSIS — R609 Edema, unspecified: Secondary | ICD-10-CM | POA: Diagnosis not present

## 2021-01-28 DIAGNOSIS — L03115 Cellulitis of right lower limb: Secondary | ICD-10-CM | POA: Diagnosis not present

## 2021-02-02 DIAGNOSIS — E538 Deficiency of other specified B group vitamins: Secondary | ICD-10-CM | POA: Diagnosis not present

## 2021-02-02 DIAGNOSIS — G459 Transient cerebral ischemic attack, unspecified: Secondary | ICD-10-CM | POA: Diagnosis not present

## 2021-02-02 DIAGNOSIS — I82513 Chronic embolism and thrombosis of femoral vein, bilateral: Secondary | ICD-10-CM | POA: Diagnosis not present

## 2021-02-02 DIAGNOSIS — N049 Nephrotic syndrome with unspecified morphologic changes: Secondary | ICD-10-CM | POA: Diagnosis not present

## 2021-02-02 DIAGNOSIS — G609 Hereditary and idiopathic neuropathy, unspecified: Secondary | ICD-10-CM | POA: Diagnosis not present

## 2021-02-02 DIAGNOSIS — I129 Hypertensive chronic kidney disease with stage 1 through stage 4 chronic kidney disease, or unspecified chronic kidney disease: Secondary | ICD-10-CM | POA: Diagnosis not present

## 2021-02-02 DIAGNOSIS — E039 Hypothyroidism, unspecified: Secondary | ICD-10-CM | POA: Diagnosis not present

## 2021-02-02 DIAGNOSIS — D649 Anemia, unspecified: Secondary | ICD-10-CM | POA: Diagnosis not present

## 2021-02-02 DIAGNOSIS — T380X5A Adverse effect of glucocorticoids and synthetic analogues, initial encounter: Secondary | ICD-10-CM | POA: Diagnosis not present

## 2021-02-02 DIAGNOSIS — E273 Drug-induced adrenocortical insufficiency: Secondary | ICD-10-CM | POA: Diagnosis not present

## 2021-02-02 DIAGNOSIS — F329 Major depressive disorder, single episode, unspecified: Secondary | ICD-10-CM | POA: Diagnosis not present

## 2021-02-02 DIAGNOSIS — I5030 Unspecified diastolic (congestive) heart failure: Secondary | ICD-10-CM | POA: Diagnosis not present

## 2021-02-03 ENCOUNTER — Other Ambulatory Visit: Payer: Self-pay | Admitting: *Deleted

## 2021-02-03 NOTE — Patient Outreach (Signed)
[facility] Post- Acute Care Coordinator follow up. Member screened for potential [facility] Care Management needs.  Mrs. Irving remains in [facility]. Telephone call made to SNF SW who reports transition plan is for LTC.   Will plan to sign off for now. No identifiable [facility] Care Management needs at this time.    Marthenia Rolling, MSN, RN,BSN [facility] Acute Care Coordinator [phone] [facility]) [phone]  (Toll free office)

## 2021-02-08 DIAGNOSIS — R627 Adult failure to thrive: Secondary | ICD-10-CM | POA: Diagnosis not present

## 2021-02-08 DIAGNOSIS — Z7901 Long term (current) use of anticoagulants: Secondary | ICD-10-CM | POA: Diagnosis not present

## 2021-02-08 DIAGNOSIS — D649 Anemia, unspecified: Secondary | ICD-10-CM | POA: Diagnosis not present

## 2021-02-08 DIAGNOSIS — N184 Chronic kidney disease, stage 4 (severe): Secondary | ICD-10-CM | POA: Diagnosis not present

## 2021-02-08 DIAGNOSIS — M069 Rheumatoid arthritis, unspecified: Secondary | ICD-10-CM | POA: Diagnosis not present

## 2021-02-08 DIAGNOSIS — M6281 Muscle weakness (generalized): Secondary | ICD-10-CM | POA: Diagnosis not present

## 2021-02-09 DIAGNOSIS — R54 Age-related physical debility: Secondary | ICD-10-CM | POA: Diagnosis not present

## 2021-02-09 DIAGNOSIS — G8929 Other chronic pain: Secondary | ICD-10-CM | POA: Diagnosis not present

## 2021-02-09 DIAGNOSIS — M069 Rheumatoid arthritis, unspecified: Secondary | ICD-10-CM | POA: Diagnosis not present

## 2021-02-09 DIAGNOSIS — Z7901 Long term (current) use of anticoagulants: Secondary | ICD-10-CM | POA: Diagnosis not present

## 2021-02-09 DIAGNOSIS — M6281 Muscle weakness (generalized): Secondary | ICD-10-CM | POA: Diagnosis not present

## 2021-02-09 DIAGNOSIS — R627 Adult failure to thrive: Secondary | ICD-10-CM | POA: Diagnosis not present

## 2021-02-09 DIAGNOSIS — I10 Essential (primary) hypertension: Secondary | ICD-10-CM | POA: Diagnosis not present

## 2021-02-09 DIAGNOSIS — I82511 Chronic embolism and thrombosis of right femoral vein: Secondary | ICD-10-CM | POA: Diagnosis not present

## 2021-02-09 DIAGNOSIS — F32A Depression, unspecified: Secondary | ICD-10-CM | POA: Diagnosis not present

## 2021-02-11 DIAGNOSIS — I10 Essential (primary) hypertension: Secondary | ICD-10-CM | POA: Diagnosis not present

## 2021-02-11 DIAGNOSIS — I82511 Chronic embolism and thrombosis of right femoral vein: Secondary | ICD-10-CM | POA: Diagnosis not present

## 2021-02-11 DIAGNOSIS — L97819 Non-pressure chronic ulcer of other part of right lower leg with unspecified severity: Secondary | ICD-10-CM | POA: Diagnosis not present

## 2021-02-11 DIAGNOSIS — M069 Rheumatoid arthritis, unspecified: Secondary | ICD-10-CM | POA: Diagnosis not present

## 2021-02-11 DIAGNOSIS — R627 Adult failure to thrive: Secondary | ICD-10-CM | POA: Diagnosis not present

## 2021-02-11 DIAGNOSIS — G8929 Other chronic pain: Secondary | ICD-10-CM | POA: Diagnosis not present

## 2021-02-11 DIAGNOSIS — M6281 Muscle weakness (generalized): Secondary | ICD-10-CM | POA: Diagnosis not present

## 2021-02-12 DIAGNOSIS — Z7901 Long term (current) use of anticoagulants: Secondary | ICD-10-CM | POA: Diagnosis not present

## 2021-02-12 DIAGNOSIS — I82511 Chronic embolism and thrombosis of right femoral vein: Secondary | ICD-10-CM | POA: Diagnosis not present

## 2021-02-12 DIAGNOSIS — I10 Essential (primary) hypertension: Secondary | ICD-10-CM | POA: Diagnosis not present

## 2021-02-12 DIAGNOSIS — M069 Rheumatoid arthritis, unspecified: Secondary | ICD-10-CM | POA: Diagnosis not present

## 2021-02-12 DIAGNOSIS — G8929 Other chronic pain: Secondary | ICD-10-CM | POA: Diagnosis not present

## 2021-02-12 DIAGNOSIS — R627 Adult failure to thrive: Secondary | ICD-10-CM | POA: Diagnosis not present

## 2021-02-12 DIAGNOSIS — M6281 Muscle weakness (generalized): Secondary | ICD-10-CM | POA: Diagnosis not present

## 2021-02-12 DIAGNOSIS — R609 Edema, unspecified: Secondary | ICD-10-CM | POA: Diagnosis not present

## 2021-02-15 DIAGNOSIS — M6281 Muscle weakness (generalized): Secondary | ICD-10-CM | POA: Diagnosis not present

## 2021-02-15 DIAGNOSIS — I10 Essential (primary) hypertension: Secondary | ICD-10-CM | POA: Diagnosis not present

## 2021-02-15 DIAGNOSIS — R627 Adult failure to thrive: Secondary | ICD-10-CM | POA: Diagnosis not present

## 2021-02-15 DIAGNOSIS — R4182 Altered mental status, unspecified: Secondary | ICD-10-CM | POA: Diagnosis not present

## 2021-02-15 DIAGNOSIS — R799 Abnormal finding of blood chemistry, unspecified: Secondary | ICD-10-CM | POA: Diagnosis not present

## 2021-02-15 DIAGNOSIS — Z7901 Long term (current) use of anticoagulants: Secondary | ICD-10-CM | POA: Diagnosis not present

## 2021-02-15 DIAGNOSIS — G8929 Other chronic pain: Secondary | ICD-10-CM | POA: Diagnosis not present

## 2021-02-16 ENCOUNTER — Non-Acute Institutional Stay: Payer: Medicare Other | Admitting: Nurse Practitioner

## 2021-02-16 ENCOUNTER — Other Ambulatory Visit: Payer: Self-pay

## 2021-02-16 DIAGNOSIS — Z515 Encounter for palliative care: Secondary | ICD-10-CM

## 2021-02-16 DIAGNOSIS — I129 Hypertensive chronic kidney disease with stage 1 through stage 4 chronic kidney disease, or unspecified chronic kidney disease: Secondary | ICD-10-CM | POA: Diagnosis not present

## 2021-02-16 DIAGNOSIS — F064 Anxiety disorder due to known physiological condition: Secondary | ICD-10-CM | POA: Diagnosis not present

## 2021-02-16 DIAGNOSIS — I10 Essential (primary) hypertension: Secondary | ICD-10-CM | POA: Diagnosis not present

## 2021-02-16 DIAGNOSIS — I82511 Chronic embolism and thrombosis of right femoral vein: Secondary | ICD-10-CM | POA: Diagnosis not present

## 2021-02-16 DIAGNOSIS — R627 Adult failure to thrive: Secondary | ICD-10-CM

## 2021-02-16 DIAGNOSIS — D649 Anemia, unspecified: Secondary | ICD-10-CM | POA: Diagnosis not present

## 2021-02-16 DIAGNOSIS — G8929 Other chronic pain: Secondary | ICD-10-CM | POA: Diagnosis not present

## 2021-02-16 DIAGNOSIS — R4182 Altered mental status, unspecified: Secondary | ICD-10-CM | POA: Diagnosis not present

## 2021-02-16 DIAGNOSIS — F0150 Vascular dementia without behavioral disturbance: Secondary | ICD-10-CM | POA: Diagnosis not present

## 2021-02-16 DIAGNOSIS — F339 Major depressive disorder, recurrent, unspecified: Secondary | ICD-10-CM | POA: Diagnosis not present

## 2021-02-16 NOTE — Progress Notes (Signed)
Therapist, nutritional Palliative Care Consult Note Telephone: [phone]  Fax: [phone]    Date of encounter: 02/16/21 PATIENT NAME: Carolyn Robertson [address] [place] [address]   [phone] (home)  DOB: 12-Mar-1946 MRN: [id number]   PRIMARY CARE PROVIDER:    Geoffry Paradise, MD,  [address] [place] [address] [phone]  REFERRING PROVIDER:   Geoffry Paradise, MD [address] [place],  [place] [address] [phone]  RESPONSIBLE PARTY:    Contact Information    Name Relation Home Work [address]   Carolyn Robertson [phone]  [phone]   Carolyn Robertson Daughter   [phone]     I met face to face with patientfacility. Palliative Care was asked to follow this patient by consultation request of  Carolyn Paradise, MD to address advance care planning and complex medical decision making. This is a follow up visit.                                  ASSESSMENT AND PLAN / RECOMMENDATIONS:   Advance Care Planning/Goals of Care: Goals include to maximize quality of life and symptom management. Discussion held with patient's husband, patient unable to participate due to poor cognition. Our advance care planning conversation included a discussion about:     The value and importance of advance care planning   Exploration of personal, cultural or spiritual beliefs that might influence medical decisions    Decision to de-escalate disease focused treatments due to poor prognosis.  CODE STATUS: DNR Patient's husband verbalized that patient's goal of care at this time is comfort. He does not desire any more hospitalization for patient saying he does not want to prolong patient's suffering. He verbalized that he and patient has been married for 62 years and had a successful marriage. He verbalized that he would miss his wife but he does not want to see her suffer. He verbalized that he is at peace with the decision. Discussed the  need to review and update patient's MOST form to reflect patient's current goal. Details of the updated  MOST form include comfort measures, no antibiotics, no IV fluids, no feeding tube.  Hospice care services were explained and offered to patient and her husband. Patient husband verbalized interest in Hospice care and want to proceed with the process.  Provided general support and encouragement. Questions and concerns were addressed. Patient's husband was encouraged to call with questions and/or concerns, my business card was provided.  Visit update discussed with facility provider Carolyn Sorrow, NP.  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 Records reviewed and summarized bellow.   HISTORY OF PRESENT ILLNESS:Carolyn Robertson a 74y.o.femalewithhistory ofVasculr dementia, CVA, stage 5 CKD (taken off transplant list due to advanced age), CHF (EF 70%), hypothyroidism,Osteoarthritis, GERD, gout, Hiatal hernia, chronic oral steroid use. Patient with ongoing decline in both cognition and function. Staff report oral intake of < 25%, with no solid food intake in the last 2-3 days. Patient currently totally dependent on facility staff for all her ADLs. She is unable to sit up unassisted and non ambulatory. She is confused, only answers yes or no to questions. No report of nausea, vomiting, SOB or uncontrolled pain. Limited ROS due to poor cognition.   Physical Exam: General: Chronically ill and frail appearing, deconditioned, lying  in bed in NAD EYES: anicteric sclera, no discharge  ENMT: intact hearing, oral mucous membranes dry CV: +2 edema to BLE, +2 edema to bilateral arms Pulmonary: no increased work of breathing, no cough, no audible wheezes, room air Abdomen: no ascites GU: deferred VIF:[id number] sarcopenia, decreased ROM to lower extremities, bilateral  foot drop, non ambulatory Skin: generalized skin bruises and ecchymosis Neuro: Generalized weakness, otherwise non-focal Psych: non-anxious affecttoday, A and O x 1 Hem/lymph/immuno: widespread bruising  Past Medical History:  Diagnosis Date  . Anxiety   . Arthritis    RA  . 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 Depression   . GERD (gastroesophageal reflux disease)   . History of blood transfusion    after miscarrige  . History of kidney stones    x 2  . Hypertension   . Hypothyroidism   . Incontinent of urine    incontinent at night  . Kidney failure   . Memory loss    "after TIA"  . Pneumonia    as a child and teenager  . Renal disorder    stage IV kidney disease  . Stroke ([facility])   . Thyroid disease   . TIA (transient ischemic attack) 06/2018   I spent  80 minutes providing this consultation. More than 50% of the time in this consultation was spent in counseling and care coordination.  Thank you for the opportunity to participate in the care of [name].  The palliative care team will continue to follow. Please call our office at [phone] if we can be of additional assistance.   Carolyn Favre, DNP, AGPCNP-BC  COVID-19 PATIENT SCREENING TOOL Asked and negative response unless otherwise noted:   Have you had symptoms of covid, tested positive or been in contact with someone with symptoms/positive test in the past 5-10 days?

## 2021-02-18 DIAGNOSIS — D649 Anemia, unspecified: Secondary | ICD-10-CM | POA: Diagnosis not present

## 2021-02-18 DIAGNOSIS — R627 Adult failure to thrive: Secondary | ICD-10-CM | POA: Diagnosis not present

## 2021-02-18 DIAGNOSIS — F32A Depression, unspecified: Secondary | ICD-10-CM | POA: Diagnosis not present

## 2021-02-18 DIAGNOSIS — Z7901 Long term (current) use of anticoagulants: Secondary | ICD-10-CM | POA: Diagnosis not present

## 2021-02-18 DIAGNOSIS — M6281 Muscle weakness (generalized): Secondary | ICD-10-CM | POA: Diagnosis not present

## 2021-02-18 DIAGNOSIS — M069 Rheumatoid arthritis, unspecified: Secondary | ICD-10-CM | POA: Diagnosis not present

## 2021-02-18 DIAGNOSIS — G8929 Other chronic pain: Secondary | ICD-10-CM | POA: Diagnosis not present

## 2021-02-18 DIAGNOSIS — L97819 Non-pressure chronic ulcer of other part of right lower leg with unspecified severity: Secondary | ICD-10-CM | POA: Diagnosis not present

## 2021-02-18 DIAGNOSIS — I82511 Chronic embolism and thrombosis of right femoral vein: Secondary | ICD-10-CM | POA: Diagnosis not present

## 2021-02-19 DIAGNOSIS — I10 Essential (primary) hypertension: Secondary | ICD-10-CM | POA: Diagnosis not present

## 2021-02-19 DIAGNOSIS — D692 Other nonthrombocytopenic purpura: Secondary | ICD-10-CM | POA: Diagnosis not present

## 2021-02-19 DIAGNOSIS — F329 Major depressive disorder, single episode, unspecified: Secondary | ICD-10-CM | POA: Diagnosis not present

## 2021-02-19 DIAGNOSIS — I129 Hypertensive chronic kidney disease with stage 1 through stage 4 chronic kidney disease, or unspecified chronic kidney disease: Secondary | ICD-10-CM | POA: Diagnosis not present

## 2021-02-19 DIAGNOSIS — I5030 Unspecified diastolic (congestive) heart failure: Secondary | ICD-10-CM | POA: Diagnosis not present

## 2021-02-19 DIAGNOSIS — G250 Essential tremor: Secondary | ICD-10-CM | POA: Diagnosis not present

## 2021-02-19 DIAGNOSIS — E273 Drug-induced adrenocortical insufficiency: Secondary | ICD-10-CM | POA: Diagnosis not present

## 2021-02-19 DIAGNOSIS — I872 Venous insufficiency (chronic) (peripheral): Secondary | ICD-10-CM | POA: Diagnosis not present

## 2021-02-19 DIAGNOSIS — N049 Nephrotic syndrome with unspecified morphologic changes: Secondary | ICD-10-CM | POA: Diagnosis not present

## 2021-02-19 DIAGNOSIS — D649 Anemia, unspecified: Secondary | ICD-10-CM | POA: Diagnosis not present

## 2021-02-19 DIAGNOSIS — E039 Hypothyroidism, unspecified: Secondary | ICD-10-CM | POA: Diagnosis not present

## 2021-02-20 DIAGNOSIS — N390 Urinary tract infection, site not specified: Secondary | ICD-10-CM | POA: Diagnosis not present

## 2021-02-23 DIAGNOSIS — R609 Edema, unspecified: Secondary | ICD-10-CM | POA: Diagnosis not present

## 2021-02-23 DIAGNOSIS — R4182 Altered mental status, unspecified: Secondary | ICD-10-CM | POA: Diagnosis not present

## 2021-02-23 DIAGNOSIS — R627 Adult failure to thrive: Secondary | ICD-10-CM | POA: Diagnosis not present

## 2021-02-23 DIAGNOSIS — M069 Rheumatoid arthritis, unspecified: Secondary | ICD-10-CM | POA: Diagnosis not present

## 2021-02-23 DIAGNOSIS — M6281 Muscle weakness (generalized): Secondary | ICD-10-CM | POA: Diagnosis not present

## 2021-02-23 DIAGNOSIS — R54 Age-related physical debility: Secondary | ICD-10-CM | POA: Diagnosis not present

## 2021-02-23 DIAGNOSIS — I129 Hypertensive chronic kidney disease with stage 1 through stage 4 chronic kidney disease, or unspecified chronic kidney disease: Secondary | ICD-10-CM | POA: Diagnosis not present

## 2021-02-23 DIAGNOSIS — G8929 Other chronic pain: Secondary | ICD-10-CM | POA: Diagnosis not present

## 2021-02-25 DIAGNOSIS — F0150 Vascular dementia without behavioral disturbance: Secondary | ICD-10-CM | POA: Diagnosis not present

## 2021-02-25 DIAGNOSIS — R627 Adult failure to thrive: Secondary | ICD-10-CM | POA: Diagnosis not present

## 2021-02-25 DIAGNOSIS — E039 Hypothyroidism, unspecified: Secondary | ICD-10-CM | POA: Diagnosis not present

## 2021-02-25 DIAGNOSIS — I69318 Other symptoms and signs involving cognitive functions following cerebral infarction: Secondary | ICD-10-CM | POA: Diagnosis not present

## 2021-02-25 DIAGNOSIS — I129 Hypertensive chronic kidney disease with stage 1 through stage 4 chronic kidney disease, or unspecified chronic kidney disease: Secondary | ICD-10-CM | POA: Diagnosis not present

## 2021-02-25 DIAGNOSIS — F418 Other specified anxiety disorders: Secondary | ICD-10-CM | POA: Diagnosis not present

## 2021-02-25 DIAGNOSIS — Z87448 Personal history of other diseases of urinary system: Secondary | ICD-10-CM | POA: Diagnosis not present

## 2021-02-25 DIAGNOSIS — Z8616 Personal history of COVID-19: Secondary | ICD-10-CM | POA: Diagnosis not present

## 2021-02-25 DIAGNOSIS — Z872 Personal history of diseases of the skin and subcutaneous tissue: Secondary | ICD-10-CM | POA: Diagnosis not present

## 2021-02-25 DIAGNOSIS — E785 Hyperlipidemia, unspecified: Secondary | ICD-10-CM | POA: Diagnosis not present

## 2021-02-25 DIAGNOSIS — R634 Abnormal weight loss: Secondary | ICD-10-CM | POA: Diagnosis not present

## 2021-02-25 DIAGNOSIS — Z6827 Body mass index (BMI) 27.0-27.9, adult: Secondary | ICD-10-CM | POA: Diagnosis not present

## 2021-02-25 DIAGNOSIS — K219 Gastro-esophageal reflux disease without esophagitis: Secondary | ICD-10-CM | POA: Diagnosis not present

## 2021-02-25 DIAGNOSIS — N189 Chronic kidney disease, unspecified: Secondary | ICD-10-CM | POA: Diagnosis not present

## 2021-02-25 DIAGNOSIS — M109 Gout, unspecified: Secondary | ICD-10-CM | POA: Diagnosis not present

## 2021-02-25 DIAGNOSIS — M069 Rheumatoid arthritis, unspecified: Secondary | ICD-10-CM | POA: Diagnosis not present

## 2021-02-26 DIAGNOSIS — N189 Chronic kidney disease, unspecified: Secondary | ICD-10-CM | POA: Diagnosis not present

## 2021-02-26 DIAGNOSIS — I129 Hypertensive chronic kidney disease with stage 1 through stage 4 chronic kidney disease, or unspecified chronic kidney disease: Secondary | ICD-10-CM | POA: Diagnosis not present

## 2021-02-26 DIAGNOSIS — R627 Adult failure to thrive: Secondary | ICD-10-CM | POA: Diagnosis not present

## 2021-02-26 DIAGNOSIS — F0150 Vascular dementia without behavioral disturbance: Secondary | ICD-10-CM | POA: Diagnosis not present

## 2021-02-26 DIAGNOSIS — E785 Hyperlipidemia, unspecified: Secondary | ICD-10-CM | POA: Diagnosis not present

## 2021-02-26 DIAGNOSIS — I69318 Other symptoms and signs involving cognitive functions following cerebral infarction: Secondary | ICD-10-CM | POA: Diagnosis not present

## 2021-02-27 DIAGNOSIS — I129 Hypertensive chronic kidney disease with stage 1 through stage 4 chronic kidney disease, or unspecified chronic kidney disease: Secondary | ICD-10-CM | POA: Diagnosis not present

## 2021-02-27 DIAGNOSIS — N189 Chronic kidney disease, unspecified: Secondary | ICD-10-CM | POA: Diagnosis not present

## 2021-02-27 DIAGNOSIS — R627 Adult failure to thrive: Secondary | ICD-10-CM | POA: Diagnosis not present

## 2021-02-27 DIAGNOSIS — E785 Hyperlipidemia, unspecified: Secondary | ICD-10-CM | POA: Diagnosis not present

## 2021-02-27 DIAGNOSIS — I69318 Other symptoms and signs involving cognitive functions following cerebral infarction: Secondary | ICD-10-CM | POA: Diagnosis not present

## 2021-02-27 DIAGNOSIS — F0150 Vascular dementia without behavioral disturbance: Secondary | ICD-10-CM | POA: Diagnosis not present

## 2021-02-28 DIAGNOSIS — F0150 Vascular dementia without behavioral disturbance: Secondary | ICD-10-CM | POA: Diagnosis not present

## 2021-02-28 DIAGNOSIS — R627 Adult failure to thrive: Secondary | ICD-10-CM | POA: Diagnosis not present

## 2021-02-28 DIAGNOSIS — E785 Hyperlipidemia, unspecified: Secondary | ICD-10-CM | POA: Diagnosis not present

## 2021-02-28 DIAGNOSIS — N189 Chronic kidney disease, unspecified: Secondary | ICD-10-CM | POA: Diagnosis not present

## 2021-02-28 DIAGNOSIS — I129 Hypertensive chronic kidney disease with stage 1 through stage 4 chronic kidney disease, or unspecified chronic kidney disease: Secondary | ICD-10-CM | POA: Diagnosis not present

## 2021-02-28 DIAGNOSIS — I69318 Other symptoms and signs involving cognitive functions following cerebral infarction: Secondary | ICD-10-CM | POA: Diagnosis not present

## 2021-03-01 DIAGNOSIS — I69318 Other symptoms and signs involving cognitive functions following cerebral infarction: Secondary | ICD-10-CM | POA: Diagnosis not present

## 2021-03-01 DIAGNOSIS — I129 Hypertensive chronic kidney disease with stage 1 through stage 4 chronic kidney disease, or unspecified chronic kidney disease: Secondary | ICD-10-CM | POA: Diagnosis not present

## 2021-03-01 DIAGNOSIS — R627 Adult failure to thrive: Secondary | ICD-10-CM | POA: Diagnosis not present

## 2021-03-01 DIAGNOSIS — E785 Hyperlipidemia, unspecified: Secondary | ICD-10-CM | POA: Diagnosis not present

## 2021-03-01 DIAGNOSIS — N189 Chronic kidney disease, unspecified: Secondary | ICD-10-CM | POA: Diagnosis not present

## 2021-03-01 DIAGNOSIS — F0150 Vascular dementia without behavioral disturbance: Secondary | ICD-10-CM | POA: Diagnosis not present

## 2021-03-17 DEATH — deceased

## 2021-08-31 IMAGING — CT CT ABD-PELV W/O CM
2 of 4 series · 16 of 46 positions shown, 18 images · non-contrast
Comparison: 03/12/2020

CLINICAL DATA: Abdominal distention

EXAM:
CT ABDOMEN AND PELVIS WITHOUT CONTRAST
TECHNIQUE: Multidetector CT imaging of the abdomen and pelvis was performed
following the standard protocol without IV contrast.

[Series 2: axial st · axial · 0.78mm/px · z∈[+1062,+1487]mm · 13 of 97 slices shown, 15 images]
[im 6/97  soft-tissue]
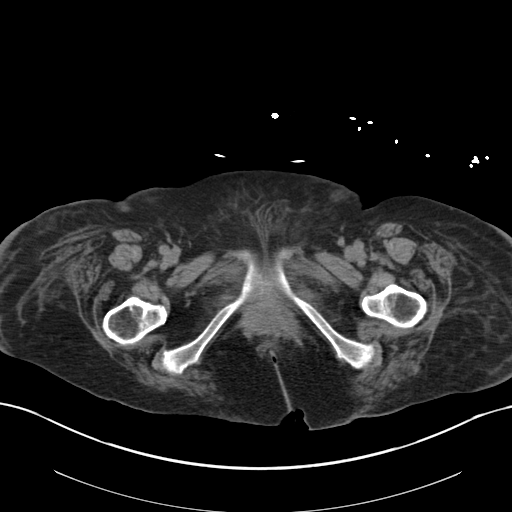
[im 6/97  bone]
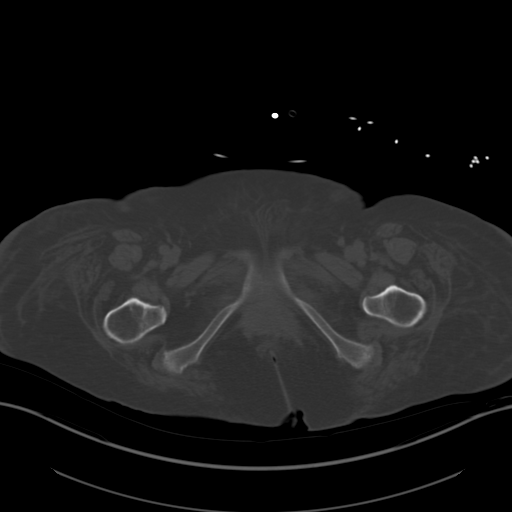
[im 16/97  soft-tissue]
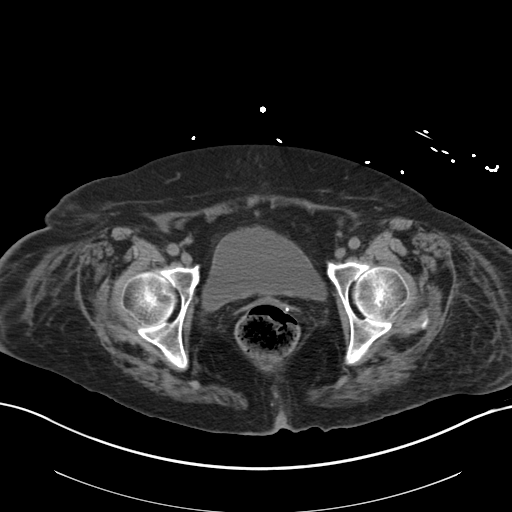
[im 21/97  soft-tissue]
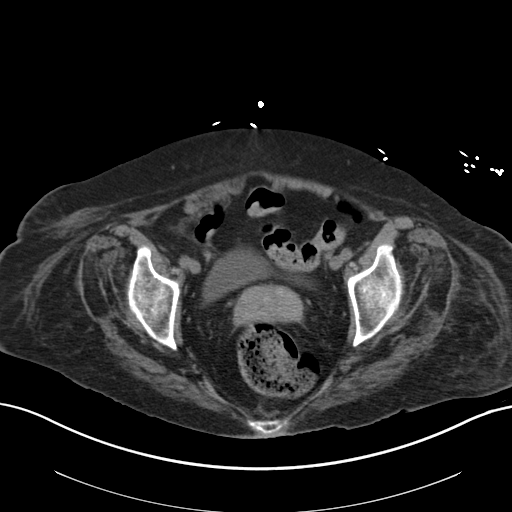
[im 26/97  soft-tissue]
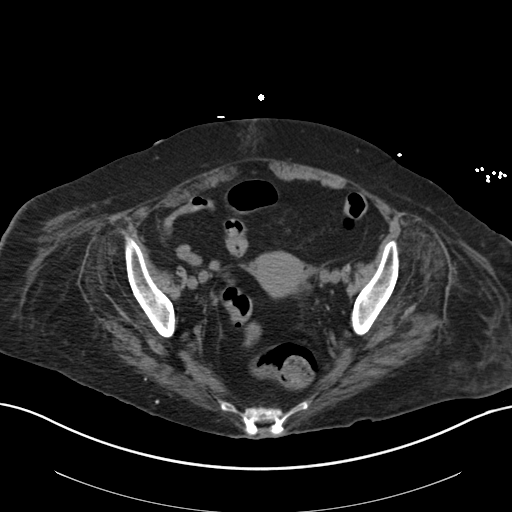
[im 36/97  soft-tissue]
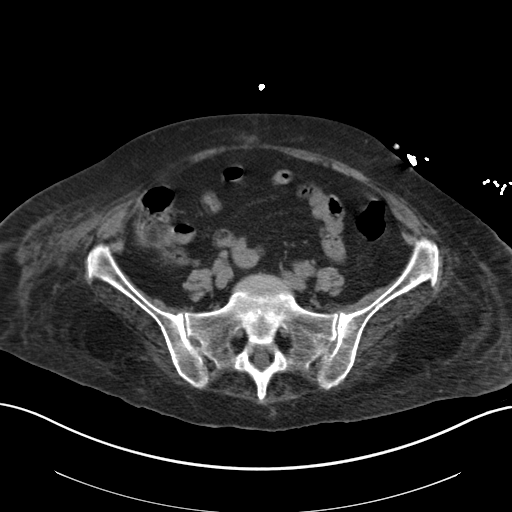
[im 41/97  soft-tissue]
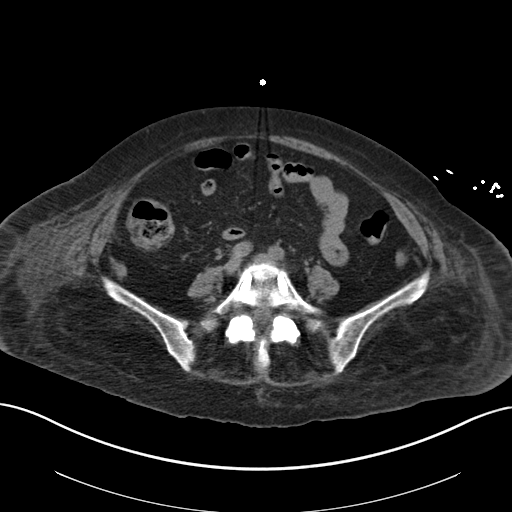
[im 51/97  soft-tissue]
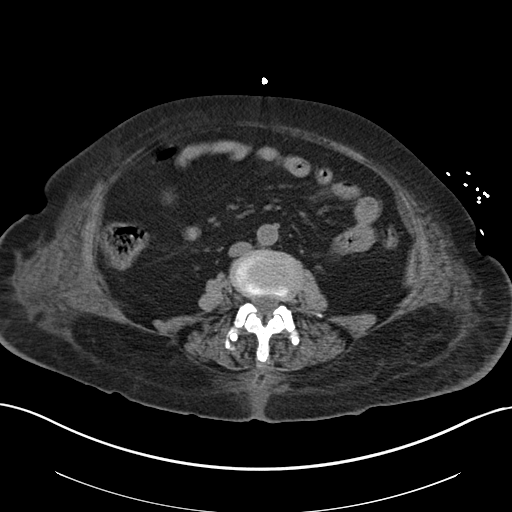
[im 56/97  soft-tissue]
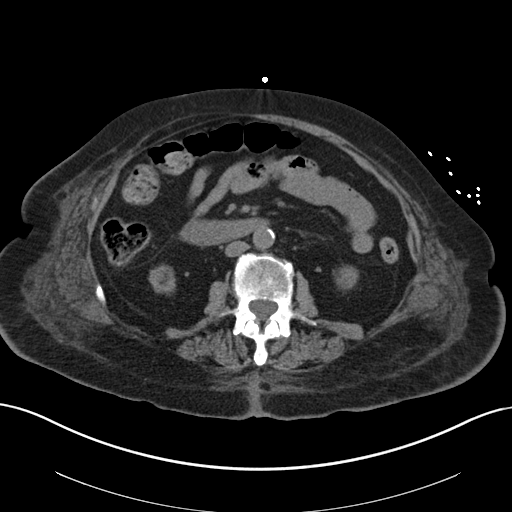
[im 61/97  soft-tissue]
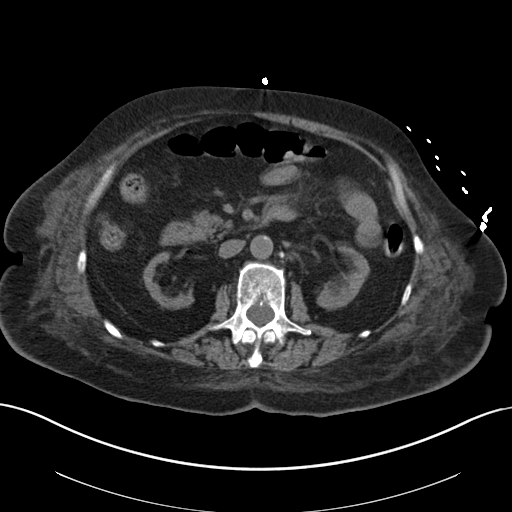
[im 61/97  bone]
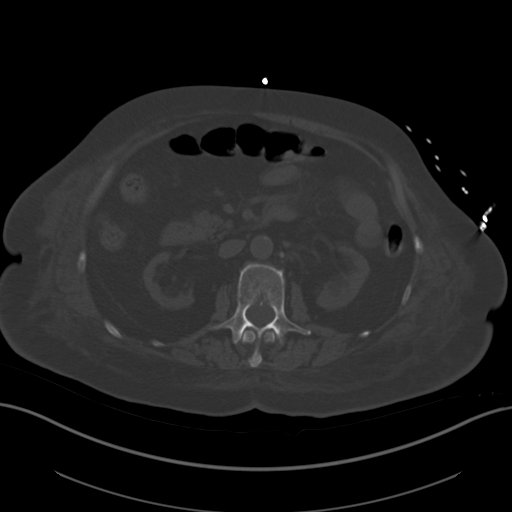
[im 71/97  soft-tissue]
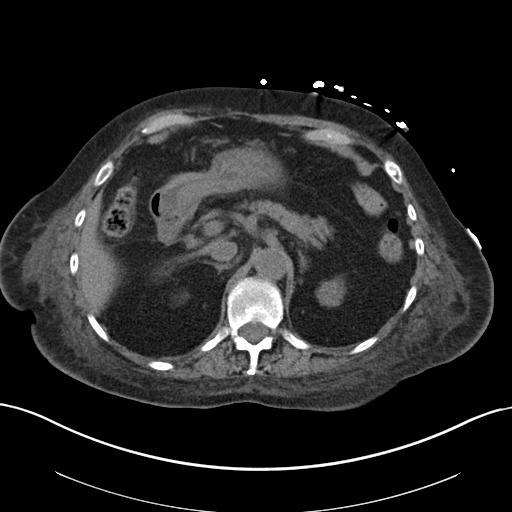
[im 76/97  soft-tissue]
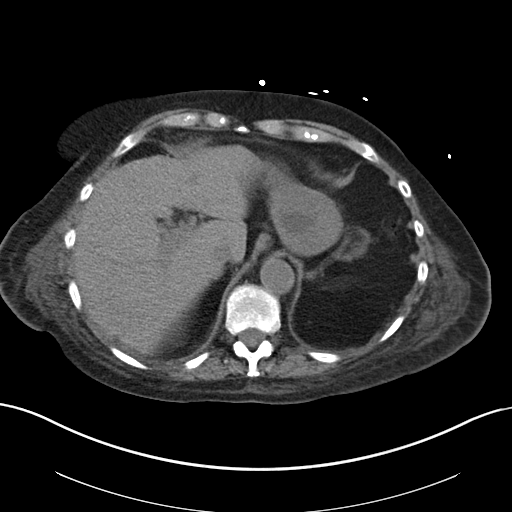
[im 81/97  soft-tissue]
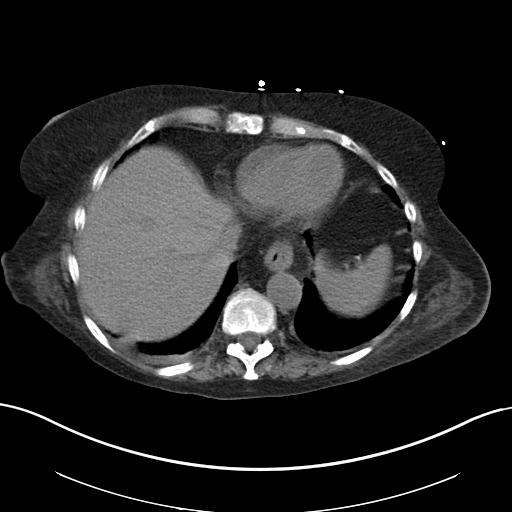
[im 91/97  soft-tissue]
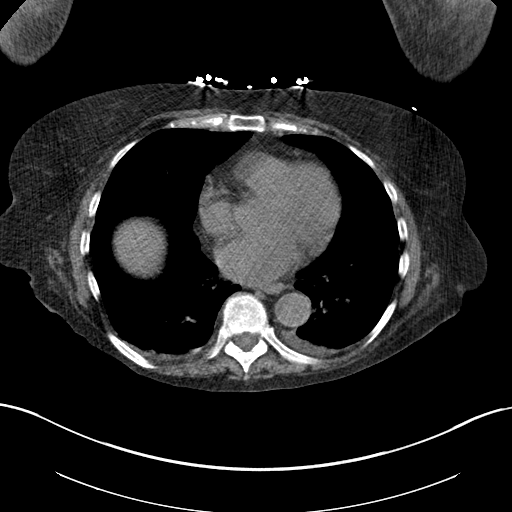

[Series 5: coronal st · coronal · 0.80mm/px · 3 of 147 slices shown]
[im 49/147  soft-tissue]
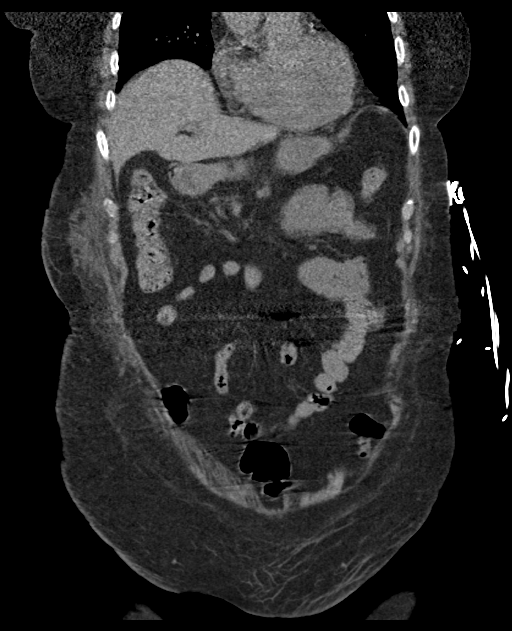
[im 65/147  soft-tissue]
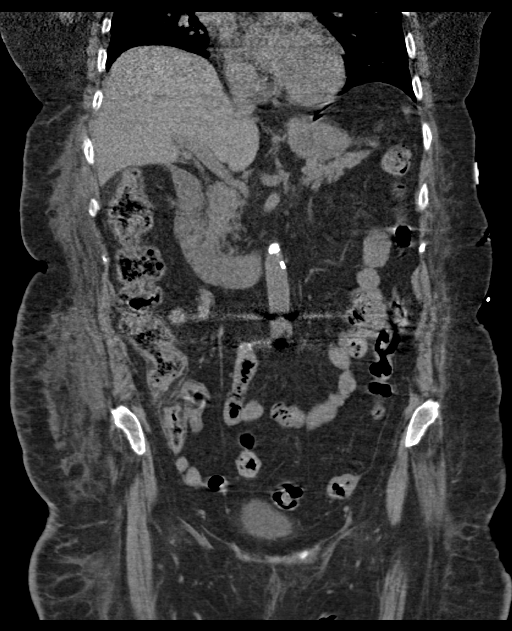
[im 82/147  soft-tissue]
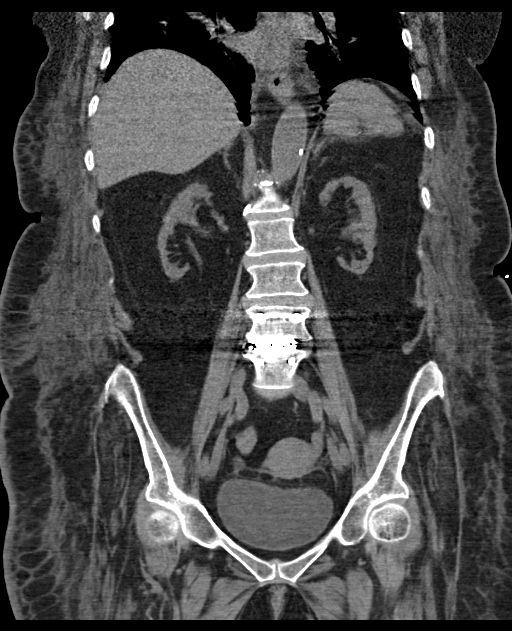

[16 of 46 positions shown; findings below may reference images not displayed]

FINDINGS: Lower chest: Trace bilateral effusions. Ground-glass airspace
opacities in both lower lobes could reflect early infiltrates. Heart
is normal size.

Hepatobiliary: No focal liver abnormality is seen. Status post
cholecystectomy. No biliary dilatation.

Pancreas: No focal abnormality or ductal dilatation.

Spleen: No focal abnormality.  Normal size.

Adrenals/Urinary Tract: No adrenal abnormality. No focal renal
abnormality. No stones or hydronephrosis. Urinary bladder is
unremarkable.

Stomach/Bowel: Normal appendix. Stomach, large and small bowel
grossly unremarkable.

Vascular/Lymphatic: Aortic atherosclerosis.

Reproductive: Prior hysterectomy.  No adnexal masses.

Other: No free fluid or free air.

Musculoskeletal: No acute bony abnormality. Postoperative changes in
the lower lumbar spine.
IMPRESSION: Trace bilateral pleural effusions. Vague ground-glass opacities in
the lower lungs could reflect early infiltrates or bronchopneumonia.

Aortic atherosclerosis.

No acute findings in the abdomen or pelvis.
